# Patient Record
Sex: Male | Born: 1945
Health system: Southern US, Community
[De-identification: ages and names within clinical notes are randomized; demographics above are authoritative.]

## PROBLEM LIST (undated history)

## (undated) DIAGNOSIS — K649 Unspecified hemorrhoids: Secondary | ICD-10-CM

## (undated) DIAGNOSIS — D490 Neoplasm of unspecified behavior of digestive system: Secondary | ICD-10-CM

## (undated) DIAGNOSIS — C44300 Unspecified malignant neoplasm of skin of unspecified part of face: Secondary | ICD-10-CM

## (undated) DIAGNOSIS — M109 Gout, unspecified: Secondary | ICD-10-CM

## (undated) DIAGNOSIS — I6523 Occlusion and stenosis of bilateral carotid arteries: Secondary | ICD-10-CM

## (undated) DIAGNOSIS — E119 Type 2 diabetes mellitus without complications: Secondary | ICD-10-CM

## (undated) DIAGNOSIS — K635 Polyp of colon: Secondary | ICD-10-CM

## (undated) DIAGNOSIS — G709 Myoneural disorder, unspecified: Secondary | ICD-10-CM

## (undated) DIAGNOSIS — I10 Essential (primary) hypertension: Secondary | ICD-10-CM

## (undated) DIAGNOSIS — C801 Malignant (primary) neoplasm, unspecified: Secondary | ICD-10-CM

## (undated) DIAGNOSIS — R0600 Dyspnea, unspecified: Secondary | ICD-10-CM

## (undated) DIAGNOSIS — I4729 Other ventricular tachycardia: Secondary | ICD-10-CM

## (undated) DIAGNOSIS — G629 Polyneuropathy, unspecified: Secondary | ICD-10-CM

## (undated) DIAGNOSIS — I5020 Unspecified systolic (congestive) heart failure: Secondary | ICD-10-CM

## (undated) DIAGNOSIS — I499 Cardiac arrhythmia, unspecified: Secondary | ICD-10-CM

## (undated) DIAGNOSIS — I420 Dilated cardiomyopathy: Secondary | ICD-10-CM

## (undated) DIAGNOSIS — I2510 Atherosclerotic heart disease of native coronary artery without angina pectoris: Secondary | ICD-10-CM

## (undated) DIAGNOSIS — G4700 Insomnia, unspecified: Secondary | ICD-10-CM

## (undated) DIAGNOSIS — Z9889 Other specified postprocedural states: Secondary | ICD-10-CM

## (undated) DIAGNOSIS — Q230 Congenital stenosis of aortic valve: Secondary | ICD-10-CM

## (undated) DIAGNOSIS — E785 Hyperlipidemia, unspecified: Secondary | ICD-10-CM

## (undated) DIAGNOSIS — J45909 Unspecified asthma, uncomplicated: Secondary | ICD-10-CM

## (undated) DIAGNOSIS — R011 Cardiac murmur, unspecified: Secondary | ICD-10-CM

## (undated) DIAGNOSIS — Z87442 Personal history of urinary calculi: Secondary | ICD-10-CM

## (undated) DIAGNOSIS — J189 Pneumonia, unspecified organism: Secondary | ICD-10-CM

## (undated) DIAGNOSIS — K5790 Diverticulosis of intestine, part unspecified, without perforation or abscess without bleeding: Secondary | ICD-10-CM

## (undated) DIAGNOSIS — I219 Acute myocardial infarction, unspecified: Secondary | ICD-10-CM

## (undated) DIAGNOSIS — I639 Cerebral infarction, unspecified: Secondary | ICD-10-CM

## (undated) DIAGNOSIS — Z7901 Long term (current) use of anticoagulants: Secondary | ICD-10-CM

## (undated) DIAGNOSIS — A4902 Methicillin resistant Staphylococcus aureus infection, unspecified site: Secondary | ICD-10-CM

## (undated) DIAGNOSIS — G4733 Obstructive sleep apnea (adult) (pediatric): Secondary | ICD-10-CM

## (undated) DIAGNOSIS — I350 Nonrheumatic aortic (valve) stenosis: Secondary | ICD-10-CM

## (undated) DIAGNOSIS — I480 Paroxysmal atrial fibrillation: Secondary | ICD-10-CM

## (undated) DIAGNOSIS — E538 Deficiency of other specified B group vitamins: Secondary | ICD-10-CM

## (undated) DIAGNOSIS — R569 Unspecified convulsions: Secondary | ICD-10-CM

## (undated) HISTORY — DX: Gout, unspecified: M10.9

## (undated) HISTORY — DX: Essential (primary) hypertension: I10

## (undated) HISTORY — DX: Obstructive sleep apnea (adult) (pediatric): G47.33

## (undated) HISTORY — PX: COLONOSCOPY: SHX174

## (undated) HISTORY — DX: Unspecified hemorrhoids: K64.9

## (undated) HISTORY — DX: Congenital stenosis of aortic valve: Q23.0

## (undated) HISTORY — DX: Polyp of colon: K63.5

## (undated) HISTORY — DX: Hyperlipidemia, unspecified: E78.5

## (undated) HISTORY — DX: Insomnia, unspecified: G47.00

## (undated) HISTORY — DX: Diverticulosis of intestine, part unspecified, without perforation or abscess without bleeding: K57.90

## (undated) HISTORY — PX: OTHER SURGICAL HISTORY: SHX169

## (undated) HISTORY — DX: Cerebral infarction, unspecified: I63.9

## (undated) HISTORY — DX: Personal history of urinary calculi: Z87.442

## (undated) HISTORY — PX: CYST EXCISION: SHX5701

---

## 1998-12-09 DIAGNOSIS — E119 Type 2 diabetes mellitus without complications: Secondary | ICD-10-CM | POA: Insufficient documentation

## 2003-12-10 HISTORY — PX: CHOLECYSTECTOMY, LAPAROSCOPIC: SHX56

## 2003-12-10 HISTORY — PX: CHOLECYSTECTOMY: SHX55

## 2004-11-10 ENCOUNTER — Other Ambulatory Visit: Payer: Self-pay

## 2004-11-10 ENCOUNTER — Inpatient Hospital Stay: Payer: Self-pay | Admitting: Surgery

## 2005-12-09 LAB — HM COLONOSCOPY

## 2007-09-19 ENCOUNTER — Emergency Department: Payer: Self-pay | Admitting: Emergency Medicine

## 2007-12-10 HISTORY — PX: HEMORRHOID SURGERY: SHX153

## 2008-08-05 ENCOUNTER — Ambulatory Visit: Payer: Self-pay | Admitting: General Surgery

## 2008-08-08 ENCOUNTER — Ambulatory Visit: Payer: Self-pay | Admitting: General Surgery

## 2012-03-10 ENCOUNTER — Ambulatory Visit: Payer: Self-pay | Admitting: Family Medicine

## 2012-03-19 ENCOUNTER — Other Ambulatory Visit: Payer: Self-pay | Admitting: Vascular Surgery

## 2012-03-19 LAB — CREATININE, SERUM: EGFR (Non-African Amer.): 59 — ABNORMAL LOW

## 2012-03-20 ENCOUNTER — Ambulatory Visit: Payer: Self-pay | Admitting: Vascular Surgery

## 2012-04-02 ENCOUNTER — Ambulatory Visit: Payer: Self-pay | Admitting: Vascular Surgery

## 2012-04-02 LAB — BASIC METABOLIC PANEL
BUN: 24 mg/dL — ABNORMAL HIGH (ref 7–18)
Calcium, Total: 9.4 mg/dL (ref 8.5–10.1)
EGFR (African American): >60
EGFR (Non-African Amer.): >60
Glucose: 207 mg/dL — ABNORMAL HIGH (ref 65–99)
Potassium: 4.6 mmol/L (ref 3.5–5.1)

## 2012-04-02 LAB — CBC
HCT: 38.9 % — ABNORMAL LOW (ref 40.0–52.0)
HGB: 13.4 g/dL (ref 13.0–18.0)
MCH: 32.7 pg (ref 26.0–34.0)
MCV: 95 fL (ref 80–100)
Platelet: 161 10*3/uL (ref 150–440)
RDW: 13.1 % (ref 11.5–14.5)

## 2012-04-17 ENCOUNTER — Inpatient Hospital Stay: Payer: Self-pay | Admitting: Vascular Surgery

## 2012-04-17 HISTORY — PX: CAROTID ARTERY ANGIOPLASTY: SHX1300

## 2012-04-17 HISTORY — PX: CAROTID ENDARTERECTOMY: SUR193

## 2012-04-18 LAB — BASIC METABOLIC PANEL
Anion Gap: 6 — ABNORMAL LOW (ref 7–16)
BUN: 13 mg/dL (ref 7–18)
Calcium, Total: 7.7 mg/dL — ABNORMAL LOW (ref 8.5–10.1)
Chloride: 107 mmol/L (ref 98–107)
Creatinine: 1.07 mg/dL (ref 0.60–1.30)
Sodium: 138 mmol/L (ref 136–145)

## 2012-04-18 LAB — CBC WITH DIFFERENTIAL/PLATELET
Basophil #: 0.1 10*3/uL (ref 0.0–0.1)
Basophil %: 0.7 %
HCT: 37.1 % — ABNORMAL LOW (ref 40.0–52.0)
Lymphocyte #: 1.3 10*3/uL (ref 1.0–3.6)
Lymphocyte %: 17.7 %
MCHC: 34.2 g/dL (ref 32.0–36.0)
MCV: 96 fL (ref 80–100)
Platelet: 126 10*3/uL — ABNORMAL LOW (ref 150–440)
WBC: 7.4 10*3/uL (ref 3.8–10.6)

## 2012-04-18 LAB — PROTIME-INR
INR: 1
Prothrombin Time: 13.9 secs (ref 11.5–14.7)

## 2012-04-18 LAB — APTT: Activated PTT: 27.7 secs (ref 23.6–35.9)

## 2012-04-22 LAB — PATHOLOGY REPORT

## 2013-08-24 LAB — BASIC METABOLIC PANEL
BUN: 11 mg/dL (ref 4–21)
CREATININE: 0.9 mg/dL (ref <=1.3)
POTASSIUM: 5.2 mmol/L (ref 3.4–5.3)
Sodium: 137 mmol/L (ref 137–147)

## 2013-08-24 LAB — TSH: TSH: 3.07 u[IU]/mL (ref <=5.90)

## 2013-08-24 LAB — LIPID PANEL
Cholesterol: 188 mg/dL (ref 0–200)
HDL: 36 mg/dL (ref 35–70)
LDL Cholesterol: 78 mg/dL
Triglycerides: 369 mg/dL — AB (ref 40–160)

## 2014-10-05 LAB — HEMOGLOBIN A1C: Hgb A1c MFr Bld: 8.1 % — AB (ref 4.0–6.0)

## 2014-12-21 DIAGNOSIS — L02413 Cutaneous abscess of right upper limb: Secondary | ICD-10-CM | POA: Diagnosis not present

## 2014-12-21 DIAGNOSIS — B9562 Methicillin resistant Staphylococcus aureus infection as the cause of diseases classified elsewhere: Secondary | ICD-10-CM | POA: Diagnosis not present

## 2014-12-21 DIAGNOSIS — L02221 Furuncle of abdominal wall: Secondary | ICD-10-CM | POA: Diagnosis not present

## 2014-12-21 DIAGNOSIS — L02424 Furuncle of left upper limb: Secondary | ICD-10-CM | POA: Diagnosis not present

## 2014-12-21 DIAGNOSIS — L02423 Furuncle of right upper limb: Secondary | ICD-10-CM | POA: Diagnosis not present

## 2015-01-03 DIAGNOSIS — X32XXXA Exposure to sunlight, initial encounter: Secondary | ICD-10-CM | POA: Diagnosis not present

## 2015-01-03 DIAGNOSIS — C44619 Basal cell carcinoma of skin of left upper limb, including shoulder: Secondary | ICD-10-CM | POA: Diagnosis not present

## 2015-01-03 DIAGNOSIS — L821 Other seborrheic keratosis: Secondary | ICD-10-CM | POA: Diagnosis not present

## 2015-01-03 DIAGNOSIS — L400 Psoriasis vulgaris: Secondary | ICD-10-CM | POA: Diagnosis not present

## 2015-01-03 DIAGNOSIS — L570 Actinic keratosis: Secondary | ICD-10-CM | POA: Diagnosis not present

## 2015-01-03 DIAGNOSIS — D485 Neoplasm of uncertain behavior of skin: Secondary | ICD-10-CM | POA: Diagnosis not present

## 2015-01-03 DIAGNOSIS — Z85828 Personal history of other malignant neoplasm of skin: Secondary | ICD-10-CM | POA: Diagnosis not present

## 2015-01-03 DIAGNOSIS — C44519 Basal cell carcinoma of skin of other part of trunk: Secondary | ICD-10-CM | POA: Diagnosis not present

## 2015-01-09 DIAGNOSIS — C44619 Basal cell carcinoma of skin of left upper limb, including shoulder: Secondary | ICD-10-CM | POA: Diagnosis not present

## 2015-01-09 DIAGNOSIS — C44519 Basal cell carcinoma of skin of other part of trunk: Secondary | ICD-10-CM | POA: Diagnosis not present

## 2015-02-06 DIAGNOSIS — I10 Essential (primary) hypertension: Secondary | ICD-10-CM | POA: Diagnosis not present

## 2015-02-06 DIAGNOSIS — E781 Pure hyperglyceridemia: Secondary | ICD-10-CM | POA: Diagnosis not present

## 2015-02-06 DIAGNOSIS — E1165 Type 2 diabetes mellitus with hyperglycemia: Secondary | ICD-10-CM | POA: Diagnosis not present

## 2015-02-06 DIAGNOSIS — Z0000 Encounter for general adult medical examination without abnormal findings: Secondary | ICD-10-CM | POA: Diagnosis not present

## 2015-02-06 LAB — BASIC METABOLIC PANEL: GLUCOSE: 224 mg/dL

## 2015-04-02 NOTE — Op Note (Signed)
PATIENT NAME:  Sean Hayden, Sean Hayden MR#:  [id number] DATE OF BIRTH:  1946-02-14  DATE OF PROCEDURE:  04/17/2012  PREOPERATIVE DIAGNOSIS: Critical stenosis of the right internal carotid artery.   POSTOPERATIVE DIAGNOSIS: Critical stenosis of the right internal carotid artery.  PROCEDURE PERFORMED:  1. Right carotid endarterectomy with Cormatrix patch angioplasty.  2. Repair of arterial defect with xenograft, Cormatrix patch.   SURGEON: Katha Cabal, M.D.   ANESTHESIA: General by endotracheal intubation.   FLUIDS: Per anesthesia record.   ESTIMATED BLOOD LOSS: 200 mL.   SPECIMEN: Carotid plaque to pathology for permanent section.   INDICATIONS: Mr. Sheppard is a 69 year old gentleman who presented to the office with the findings of carotid stenosis on ultrasound. Subsequent physical examination as well as a follow-up CT angiography demonstrated greater than 90% stenosis of the right internal carotid artery. The risks and benefits for carotid endarterectomy were reviewed with the patient and the wife. Alternative therapies were also reviewed. All questions were answered. The patient has agreed to proceed with surgery.   DESCRIPTION OF PROCEDURE: The patient is taken to the operating room and placed in the supine position. After adequate general anesthesia is induced and appropriate invasive monitors are placed, he is positioned with his neck extended and rotated to the left. Neck and chest wall are prepped and draped in sterile fashion.   A curvilinear incision is created along the anterior margin of the sternocleidomastoid muscle. The dissection is carried down through the soft tissues to expose the platysma. The platysma is transected. The external jugular vein is ligated with 2-0 silk ties. Omohyoid is then revealed and the common carotid artery identified at the level of the omohyoid. Dissection is then carried along the anterior margin of the carotid in a craniad direction. Bifurcation is  exposed. The superior thyroid and external carotid artery are looped with Silastic vessel loops. 7000 units of heparin is given and allowed to circulate for five minutes.   After adequate dissection of the internal carotid artery and the common carotid artery, the common carotid artery is clamped followed by the external, followed by the internal carotid artery. Arteriotomy is made with a #11 blade scalpel and extended, and an indwelling Sundt shunt is placed without difficulty and flow is re-established to the brain.   Endarterectomy is performed. Under direct visualization for the common bulb and internal carotid artery, the external carotid artery is treated with eversion technique. 7-0 interrupted Prolene sutures are used to tack the distal intimal edge within the internal carotid artery. 6-0 Prolene sutures are used to tack the proximal intimal edge in the common carotid artery. A total of five 7-0 and four 6-0 Prolenes are utilized.   Cormatrix patch is then rehydrated on the back table. It is trimmed to an appropriate bevel, and then applied to the arterial defect using interrupted 6-0 Prolene in a running fashion with a four-quadrant technique. Prior to completing the last suture line, the Sundt shunt is removed. Copious irrigation is performed throughout the procedure including 40 mL at the time of shunt removal. Backbleeding is allowed for several seconds and the suture line is completed. Flow is then re-established first to the external carotid artery and then the internal carotid artery to prevent distal embolization. The suture line is inspected for hemostasis. Surgicel is placed after the wound is irrigated and then the wound is closed using 3-0 running Vicryl for the platysma and 4-0 Monocryl subcuticular followed by Dermabond for the skin.    The patient  is awakened in the operating room, moving all extremities. Sponge and needle counts are correct times two. He is taken to the recovery area  in stable condition.    ____________________________ Katha Cabal, MD ggs:bjt D: 04/17/2012 13:47:11 ET T: 04/17/2012 14:20:02 ET JOB#: [id number]  cc: Katha Cabal, MD, <Dictator> Kirstie Peri. Caryn Section, MD Katha Cabal MD ELECTRONICALLY SIGNED 04/27/2012 17:41

## 2015-05-15 DIAGNOSIS — X32XXXA Exposure to sunlight, initial encounter: Secondary | ICD-10-CM | POA: Diagnosis not present

## 2015-05-15 DIAGNOSIS — L408 Other psoriasis: Secondary | ICD-10-CM | POA: Diagnosis not present

## 2015-05-15 DIAGNOSIS — L570 Actinic keratosis: Secondary | ICD-10-CM | POA: Diagnosis not present

## 2015-05-15 DIAGNOSIS — Z85828 Personal history of other malignant neoplasm of skin: Secondary | ICD-10-CM | POA: Diagnosis not present

## 2015-05-25 ENCOUNTER — Encounter: Payer: Self-pay | Admitting: Family Medicine

## 2015-05-25 ENCOUNTER — Encounter: Payer: Self-pay | Admitting: *Deleted

## 2015-05-25 ENCOUNTER — Ambulatory Visit (INDEPENDENT_AMBULATORY_CARE_PROVIDER_SITE_OTHER): Payer: Commercial Managed Care - HMO | Admitting: Family Medicine

## 2015-05-25 VITALS — BP 114/58 | HR 78 | Temp 98.6°F | Resp 18 | Ht 67.0 in | Wt 262.0 lb

## 2015-05-25 DIAGNOSIS — G459 Transient cerebral ischemic attack, unspecified: Secondary | ICD-10-CM | POA: Insufficient documentation

## 2015-05-25 DIAGNOSIS — R609 Edema, unspecified: Secondary | ICD-10-CM | POA: Insufficient documentation

## 2015-05-25 DIAGNOSIS — L409 Psoriasis, unspecified: Secondary | ICD-10-CM | POA: Insufficient documentation

## 2015-05-25 DIAGNOSIS — I10 Essential (primary) hypertension: Secondary | ICD-10-CM

## 2015-05-25 DIAGNOSIS — M1990 Unspecified osteoarthritis, unspecified site: Secondary | ICD-10-CM | POA: Insufficient documentation

## 2015-05-25 DIAGNOSIS — E1165 Type 2 diabetes mellitus with hyperglycemia: Secondary | ICD-10-CM | POA: Diagnosis not present

## 2015-05-25 DIAGNOSIS — IMO0002 Reserved for concepts with insufficient information to code with codable children: Secondary | ICD-10-CM

## 2015-05-25 DIAGNOSIS — K649 Unspecified hemorrhoids: Secondary | ICD-10-CM | POA: Insufficient documentation

## 2015-05-25 DIAGNOSIS — G4730 Sleep apnea, unspecified: Secondary | ICD-10-CM | POA: Insufficient documentation

## 2015-05-25 DIAGNOSIS — G4733 Obstructive sleep apnea (adult) (pediatric): Secondary | ICD-10-CM | POA: Insufficient documentation

## 2015-05-25 DIAGNOSIS — Z87442 Personal history of urinary calculi: Secondary | ICD-10-CM

## 2015-05-25 DIAGNOSIS — I779 Disorder of arteries and arterioles, unspecified: Secondary | ICD-10-CM | POA: Diagnosis not present

## 2015-05-25 DIAGNOSIS — I739 Peripheral vascular disease, unspecified: Secondary | ICD-10-CM

## 2015-05-25 DIAGNOSIS — J45909 Unspecified asthma, uncomplicated: Secondary | ICD-10-CM | POA: Insufficient documentation

## 2015-05-25 DIAGNOSIS — K5730 Diverticulosis of large intestine without perforation or abscess without bleeding: Secondary | ICD-10-CM | POA: Insufficient documentation

## 2015-05-25 HISTORY — DX: Personal history of urinary calculi: Z87.442

## 2015-05-25 MED ORDER — INSULIN DETEMIR 100 UNIT/ML ~~LOC~~ SOLN: 60.0000 [IU] | Freq: Two times a day (BID) | SUBCUTANEOUS | Status: DC

## 2015-05-25 MED ORDER — ASPIRIN 81 MG PO TABS: 81.0000 mg | ORAL_TABLET | Freq: Two times a day (BID) | ORAL | Status: DC

## 2015-05-25 NOTE — Progress Notes (Signed)
Patient: Sean Hayden Male    DOB: 1946-04-07   69 y.o.   MRN: [id number] Visit Date: 05/25/2015  Today's Provider: Lelon Huh, MD   Chief Complaint  Patient presents with  . Follow-up  . Diabetes   Subjective:    HPI   Diabetes Mellitus Type II, Follow-up:   No results found for: HGBA1C  Last A1C-10/05/2014=8.1  Last seen for diabetes 4 months ago.  Management changes included Increase 2nd dose of Levemir to 50 units for total daily dose of 100 units. He has labs done at [place] last week with Hgba1c=9.0. He had negative urine microalbumin, normal electrolyte and eGFR=96.2, and normal TSH.  He reports good compliance with treatment. He is not having side effects. none2 Home blood sugar records: fasting range: 212 usually upper 200s after eating.  He states he has been consuming a lot of breads.  Episodes of hypoglycemia? no   Current Insulin Regimen: Levemir 50 units BID Most Recent Eye Exam: last year Weight trend: stable Current exercise: walking  Pertinent Labs:    Component Value Date/Time   CREATININE 1.07 04/18/2012 0412    Wt Readings from Last 3 Encounters:  05/25/15 262 lb (118.842 kg)  02/06/15 268 lb (121.564 kg)    ------------------------------------------------------------------------  Speech difficulty.  He states that he had an episode last week after getting up than he couldn't get his words out. Episode lasted a few minutes and spontaneously resolved. He does have a history of carotid artery disease followed by Dr. Lucky Cowboy, but is not sure when he was last seen. Our last records from Dr. Lucky Cowboy are from 2014.     Previous Medications   ACYCLOVIR (ZOVIRAX) 200 MG CAPSULE    Take by mouth.   ASPIRIN 81 MG TABLET    Take by mouth.   ATORVASTATIN (LIPITOR) 40 MG TABLET    Take by mouth.   CANAGLIFLOZIN (INVOKANA) 300 MG TABS TABLET    Take by mouth.   FLAXSEED, LINSEED, (FLAXSEED OIL) 1000 MG CAPS       FLUTICASONE (FLONASE) 50 MCG/ACT NASAL SPRAY     Place into the nose.   INDOMETHACIN (INDOCIN SR) 75 MG CR CAPSULE    Take by mouth.   INSULIN DETEMIR (LEVEMIR) 100 UNIT/ML INJECTION    Inject into the skin.   LISINOPRIL (PRINIVIL,ZESTRIL) 5 MG TABLET    Take by mouth.   LORATADINE (CLARITIN) 10 MG TABLET    Take by mouth.   METFORMIN (GLUCOPHAGE) 1000 MG TABLET    Take by mouth.   NEOMYCIN-POLYMYXIN-HYDROCORTISONE (CORTISPORIN) OTIC SOLUTION    Place in ear(s).   OMEGA-3 FATTY ACIDS (FISH OIL) 1200 MG CAPS    Take by mouth.   SPECIALTY VITAMINS PRODUCTS PO    Take by mouth.   TRIAMCINOLONE CREAM (KENALOG) 0.5 %    TRIAMCINOLONE ACETONIDE, 0.5% (External Cream)  1 application to affected area wo times daily, as needed for 0 days  Quantity: 30;  Refills: 1   Ordered :14-Jan-2014  Lelon Huh MD;  Started 14-Jan-2014 Active Comments: Medication taken as needed.    ZOLPIDEM (AMBIEN) 10 MG TABLET    Take by mouth.    Review of Systems  Constitutional: Positive for fever. Negative for chills.  HENT: Negative for congestion.   Respiratory: Negative for chest tightness and shortness of breath.   Cardiovascular: Negative for chest pain, palpitations and leg swelling.  Neurological: Negative for dizziness.    History  Substance Use Topics  . Smoking status:  Former Smoker  . Smokeless tobacco: Not on file  . Alcohol Use: 0.0 oz/week    0 Standard drinks or equivalent per week   Objective:   BP 114/58 mmHg  Pulse 78  Temp(Src) 98.6 F (37 C) (Oral)  Resp 18  Ht _0  (1.702 m)  Wt 262 lb (118.842 kg)  BMI 41.03 kg/m2  SpO2 95%  Physical Exam  General Appearance:    Alert, cooperative, no distress, obese  Eyes:    PERRL, conjunctiva/corneas clear, EOM's intact       Lungs:     Clear to auscultation bilaterally, respirations unlabored  Heart:    Regular rate and rhythm  Neurologic:   Awake, alert, oriented x 3. No apparent focal neurological           defect.            Assessment & Plan:     1. Diabetes mellitus  type 2, uncontrolled Increase Levemir to 60 units twice daily. Recheck in 3 months.   2. Essential hypertension Stable. Continue current medications.    3. Transient cerebral ischemia, unspecified transient cerebral ischemia type (suspected based on high risk) Get back in with vascular ASAP. Will increase ASA to 2 x 50m daily for now.   4. Carotid artery disease, unspecified laterality      Follow up: No Follow-up on file.

## 2015-05-29 ENCOUNTER — Encounter: Payer: Self-pay | Admitting: Family Medicine

## 2015-05-29 DIAGNOSIS — G459 Transient cerebral ischemic attack, unspecified: Secondary | ICD-10-CM | POA: Diagnosis not present

## 2015-05-29 DIAGNOSIS — E119 Type 2 diabetes mellitus without complications: Secondary | ICD-10-CM | POA: Diagnosis not present

## 2015-05-29 DIAGNOSIS — E669 Obesity, unspecified: Secondary | ICD-10-CM | POA: Diagnosis not present

## 2015-05-29 DIAGNOSIS — I6529 Occlusion and stenosis of unspecified carotid artery: Secondary | ICD-10-CM | POA: Diagnosis not present

## 2015-05-30 ENCOUNTER — Other Ambulatory Visit: Payer: Self-pay | Admitting: Vascular Surgery

## 2015-05-30 DIAGNOSIS — G459 Transient cerebral ischemic attack, unspecified: Secondary | ICD-10-CM

## 2015-05-30 DIAGNOSIS — I6523 Occlusion and stenosis of bilateral carotid arteries: Secondary | ICD-10-CM

## 2015-06-06 ENCOUNTER — Ambulatory Visit
Admission: RE | Admit: 2015-06-06 | Discharge: 2015-06-06 | Disposition: A | Discharge status: Home / Self Care | Payer: Commercial Managed Care - HMO | Source: Ambulatory Visit | Attending: Vascular Surgery | Admitting: Vascular Surgery

## 2015-06-06 DIAGNOSIS — I6529 Occlusion and stenosis of unspecified carotid artery: Secondary | ICD-10-CM | POA: Insufficient documentation

## 2015-06-06 DIAGNOSIS — G459 Transient cerebral ischemic attack, unspecified: Secondary | ICD-10-CM | POA: Diagnosis not present

## 2015-06-06 DIAGNOSIS — I6522 Occlusion and stenosis of left carotid artery: Secondary | ICD-10-CM | POA: Diagnosis not present

## 2015-06-06 DIAGNOSIS — I6523 Occlusion and stenosis of bilateral carotid arteries: Secondary | ICD-10-CM

## 2015-06-06 DIAGNOSIS — I771 Stricture of artery: Secondary | ICD-10-CM | POA: Diagnosis not present

## 2015-06-06 DIAGNOSIS — I6501 Occlusion and stenosis of right vertebral artery: Secondary | ICD-10-CM | POA: Diagnosis not present

## 2015-06-06 MED ORDER — IOHEXOL 350 MG/ML SOLN
100.0000 mL | Freq: Once | INTRAVENOUS | Status: AC | PRN
  Administered 2015-06-06: 80 mL via INTRAVENOUS

## 2015-06-08 DIAGNOSIS — G459 Transient cerebral ischemic attack, unspecified: Secondary | ICD-10-CM | POA: Diagnosis not present

## 2015-06-08 DIAGNOSIS — E785 Hyperlipidemia, unspecified: Secondary | ICD-10-CM | POA: Diagnosis not present

## 2015-06-08 DIAGNOSIS — E669 Obesity, unspecified: Secondary | ICD-10-CM | POA: Diagnosis not present

## 2015-06-08 DIAGNOSIS — I6529 Occlusion and stenosis of unspecified carotid artery: Secondary | ICD-10-CM | POA: Diagnosis not present

## 2015-06-08 DIAGNOSIS — I10 Essential (primary) hypertension: Secondary | ICD-10-CM | POA: Diagnosis not present

## 2015-06-08 DIAGNOSIS — E119 Type 2 diabetes mellitus without complications: Secondary | ICD-10-CM | POA: Diagnosis not present

## 2015-07-11 ENCOUNTER — Other Ambulatory Visit: Payer: Self-pay | Admitting: Family Medicine

## 2015-08-23 ENCOUNTER — Encounter: Payer: Self-pay | Admitting: *Deleted

## 2015-08-25 ENCOUNTER — Encounter: Payer: Self-pay | Admitting: Family Medicine

## 2015-08-25 ENCOUNTER — Ambulatory Visit (INDEPENDENT_AMBULATORY_CARE_PROVIDER_SITE_OTHER): Payer: Commercial Managed Care - HMO | Admitting: Family Medicine

## 2015-08-25 VITALS — BP 100/54 | HR 79 | Temp 98.7°F | Resp 18 | Ht 67.0 in | Wt 265.0 lb

## 2015-08-25 DIAGNOSIS — IMO0002 Reserved for concepts with insufficient information to code with codable children: Secondary | ICD-10-CM

## 2015-08-25 DIAGNOSIS — Z23 Encounter for immunization: Secondary | ICD-10-CM

## 2015-08-25 DIAGNOSIS — E1165 Type 2 diabetes mellitus with hyperglycemia: Secondary | ICD-10-CM | POA: Diagnosis not present

## 2015-08-25 LAB — POCT GLYCOSYLATED HEMOGLOBIN (HGB A1C)
Est. average glucose Bld gHb Est-mCnc: 229
Hemoglobin A1C: 9.6

## 2015-08-25 MED ORDER — INSULIN DETEMIR 100 UNIT/ML ~~LOC~~ SOLN: 65.0000 [IU] | Freq: Two times a day (BID) | SUBCUTANEOUS | Status: DC

## 2015-08-25 NOTE — Patient Instructions (Addendum)
Increase Levemir to 65 units twice a day   Exercise 140 minutes every week, average of 30 minutes a day   Basic Carbohydrate Counting for Diabetes Mellitus Carbohydrate counting is a method for keeping track of the amount of carbohydrates you eat. Eating carbohydrates naturally increases the level of sugar (glucose) in your blood, so it is important for you to know the amount that is okay for you to have in every meal. Carbohydrate counting helps keep the level of glucose in your blood within normal limits. The amount of carbohydrates allowed is different for every person. A dietitian can help you calculate the amount that is right for you. Once you know the amount of carbohydrates you can have, you can count the carbohydrates in the foods you want to eat. Carbohydrates are found in the following foods:  Grains, such as breads and cereals.  Dried beans and soy products.  Starchy vegetables, such as potatoes, peas, and corn.  Fruit and fruit juices.  Milk and yogurt.  Sweets and snack foods, such as cake, cookies, candy, chips, soft drinks, and fruit drinks. CARBOHYDRATE COUNTING There are two ways to count the carbohydrates in your food. You can use either of the methods or a combination of both. Reading the "Nutrition Facts" on [place] The "Nutrition Facts" is an area that is included on the labels of almost all packaged food and beverages in the [place]. It includes the serving size of that food or beverage and information about the nutrients in each serving of the food, including the grams (g) of carbohydrate per serving.  Decide the number of servings of this food or beverage that you will be able to eat or drink. Multiply that number of servings by the number of grams of carbohydrate that is listed on the label for that serving. The total will be the amount of carbohydrates you will be having when you eat or drink this food or beverage. Learning Standard Serving Sizes of  Food When you eat food that is not packaged or does not include "Nutrition Facts" on the label, you need to measure the servings in order to count the amount of carbohydrates.A serving of most carbohydrate-rich foods contains about 15 g of carbohydrates. The following list includes serving sizes of carbohydrate-rich foods that provide 15 g ofcarbohydrate per serving:   1 slice of bread (1 oz) or 1 six-inch tortilla.    of a hamburger bun or English muffin.  4-6 crackers.   cup unsweetened dry cereal.    cup hot cereal.   cup rice or pasta.    cup mashed potatoes or  of a large baked potato.  1 cup fresh fruit or one small piece of fruit.    cup canned or frozen fruit or fruit juice.  1 cup milk.   cup plain fat-free yogurt or yogurt sweetened with artificial sweeteners.   cup cooked dried beans or starchy vegetable, such as peas, corn, or potatoes.  Decide the number of standard-size servings that you will eat. Multiply that number of servings by 15 (the grams of carbohydrates in that serving). For example, if you eat 2 cups of strawberries, you will have eaten 2 servings and 30 g of carbohydrates (2 servings x 15 g = 30 g). For foods such as soups and casseroles, in which more than one food is mixed in, you will need to count the carbohydrates in each food that is included. EXAMPLE OF CARBOHYDRATE COUNTING Sample Dinner  3  oz chicken breast.   cup of brown rice.   cup of corn.  1 cup milk.   1 cup strawberries with sugar-free whipped topping.  Carbohydrate Calculation Step 1: Identify the foods that contain carbohydrates:   Rice.   Corn.   Milk.   Strawberries. Step 2:Calculate the number of servings eaten of each:   2 servings of rice.   1 serving of corn.   1 serving of milk.   1 serving of strawberries. Step 3: Multiply each of those number of servings by 15 g:   2 servings of rice x 15 g = 30 g.   1 serving of corn x 15 g  = 15 g.   1 serving of milk x 15 g = 15 g.   1 serving of strawberries x 15 g = 15 g. Step 4: Add together all of the amounts to find the total grams of carbohydrates eaten: 30 g + 15 g + 15 g + 15 g = 75 g. Document Released: 11/25/2005 Document Revised: 04/11/2014 Document Reviewed: 10/22/2013 [facility] Patient Information 2015 [place], [place]. This information is not intended to replace advice given to you by your health care Sean Hayden. Make sure you discuss any questions you have with your health care Sean Hayden.

## 2015-08-25 NOTE — Progress Notes (Signed)
Patient: Sean Hayden Male    DOB: [birth year]   69 y.o.   MRN: [id number] Visit Date: 08/25/2015  Today's Provider: Lelon Huh, MD   Chief Complaint  Patient presents with  . Follow-up  . Diabetes  . Hypertension  . Transient Ischemic Attack   Subjective:    HPI  Low-up for TIA from 05/25/2015; advised to return to vascular and increase aspirin 81 mg x2 qd for now.    Diabetes Mellitus Type II, Follow-up:   Lab Results  Component Value Date   HGBA1C 8.1* 10/05/2014   Last seen for diabetes 3 months ago.  Management since then includes; increase Levemir to 60 units x2 qd. He reports good compliance with treatment. He is not having side effects. none Current symptoms include none and have been . Home blood sugar records: fasting range: 160-200 Post Prandials 250-00 Episodes of hypoglycemia? no   Current Insulin Regimen: 60 units twice a day Most Recent Eye Exam: due now Weight trend: stable Prior visit with dietician: no Current diet: well balanced Current exercise: none  He admits to poor eating habits including large meals with lots of red meats and lots of breads and biscuits.   ------------------------------------------------------------------------   Hypertension, follow-up:  BP Readings from Last 3 Encounters:  08/25/15 100/54  05/25/15 114/58  02/06/15 124/60    He was last seen for hypertension 3 months ago.  BP at that visit was 114/58. Management since that visit includes none .He reports good compliance with treatment. He is not having side effects. none  He is not exercising. He is adherent to low salt diet.   Outside blood pressures are normal. He is experiencing none.  Patient denies none.   Cardiovascular risk factors include diabetes mellitus.  Use of agents associated with hypertension: none.   Wt Readings from Last 3 Encounters:  08/25/15 265 lb (120.203 kg)  05/25/15 262 lb (118.842 kg)  02/06/15 268 lb (121.564 kg)     ----------------------------------------------------------------------  Follow up suspected TIA He has had follow up with Dr. Delana Meyer since last visit and had CTA of carotids which were unremarkable. He has had no symptoms since then.    Allergies  Allergen Reactions  . Actos  [Pioglitazone]    Previous Medications   ACYCLOVIR (ZOVIRAX) 200 MG CAPSULE    Take by mouth.   ASPIRIN 81 MG TABLET    Take 1 tablet (81 mg total) by mouth 2 (two) times daily.   ATORVASTATIN (LIPITOR) 40 MG TABLET    Take by mouth.   FLAXSEED, LINSEED, (FLAXSEED OIL) 1000 MG CAPS       FLUTICASONE (FLONASE) 50 MCG/ACT NASAL SPRAY    Place into the nose.   INDOMETHACIN (INDOCIN SR) 75 MG CR CAPSULE    Take by mouth.   INSULIN DETEMIR (LEVEMIR) 100 UNIT/ML INJECTION    Inject 0.6 mLs (60 Units total) into the skin 2 (two) times daily.   INVOKANA 300 MG TABS TABLET    TAKE ONE TABLET BY MOUTH EVERY DAY IN THE MORNING.   LISINOPRIL (PRINIVIL,ZESTRIL) 5 MG TABLET    Take by mouth.   LORATADINE (CLARITIN) 10 MG TABLET    Take by mouth.   METFORMIN (GLUCOPHAGE) 1000 MG TABLET    Take by mouth.   NEOMYCIN-POLYMYXIN-HYDROCORTISONE (CORTISPORIN) OTIC SOLUTION    Place in ear(s).   OMEGA-3 FATTY ACIDS (FISH OIL) 1200 MG CAPS    Take by mouth.   SPECIALTY VITAMINS PRODUCTS PO  Take by mouth.   TRIAMCINOLONE CREAM (KENALOG) 0.5 %    TRIAMCINOLONE ACETONIDE, 0.5% (External Cream)  1 application to affected area wo times daily, as needed for 0 days  Quantity: 30;  Refills: 1   Ordered :14-Jan-2014  Lelon Huh MD;  Started 14-Jan-2014 Active Comments: Medication taken as needed.    ZOLPIDEM (AMBIEN) 10 MG TABLET    Take by mouth.    Review of Systems  Constitutional: Negative for fever, chills and appetite change.  Respiratory: Negative for chest tightness, shortness of breath and wheezing.   Cardiovascular: Negative.  Negative for chest pain and palpitations.  Gastrointestinal: Negative for nausea, vomiting  and abdominal pain.  Neurological: Negative for dizziness, light-headedness and headaches.    Social History  Substance Use Topics  . Smoking status: Former Smoker    Types: Cigars    Quit date: 04/08/2012  . Smokeless tobacco: Not on file     Comment: smokes cigars occasionally  . Alcohol Use: 0.0 oz/week    0 Standard drinks or equivalent per week     Comment: occassional   Objective:   BP 100/54 mmHg  Pulse 79  Temp(Src) 98.7 F (37.1 C) (Oral)  Resp 18  Ht 5\' 7"  (1.702 m)  Wt 265 lb (120.203 kg)  BMI 41.50 kg/m2  SpO2 95%  Physical Exam  General Appearance:    Alert, cooperative, no distress, obese  Eyes:    PERRL, conjunctiva/corneas clear, EOM's intact       Lungs:     Clear to auscultation bilaterally, respirations unlabored  Heart:    Regular rate and rhythm  Neurologic:   Awake, alert, oriented x 3. No apparent focal neurological           defect.         Results for orders placed or performed in visit on 08/25/15  POCT glycosylated hemoglobin (Hb A1C)  Result Value Ref Range   Hemoglobin A1C 9.6    Est. average glucose Bld gHb Est-mCnc 229        Assessment & Plan:     1. Diabetes mellitus type 2, uncontrolled Poor compliance with diet and exercise. Recommended he make significant reductions in portion sizes and cut back on carbs,. and start exercising up to 140 minutes a week. Recommend he start mealtime insulin but he strongly opposed to this and wants to try working on lifestyle changes first. .   - Increase Levemir to 65 units twice a day.  - POCT glycosylated hemoglobin (Hb A1C)  2. HTN well controlled Continue current medications.    3. Need for influenza vaccination  - Flu vaccine HIGH DOSE PF   He has follow up at [facility] in November and will get A1c checked there, then follow up her in December.       Lelon Huh, MD  [facility]

## 2015-09-05 ENCOUNTER — Other Ambulatory Visit: Payer: Self-pay | Admitting: Family Medicine

## 2015-09-05 NOTE — Telephone Encounter (Signed)
Please call in zolpidem  

## 2015-09-06 NOTE — Telephone Encounter (Signed)
Rx called in to pharmacy. 

## 2015-09-17 DIAGNOSIS — L02411 Cutaneous abscess of right axilla: Secondary | ICD-10-CM | POA: Diagnosis not present

## 2015-10-09 ENCOUNTER — Other Ambulatory Visit: Payer: Self-pay | Admitting: Family Medicine

## 2015-10-09 NOTE — Telephone Encounter (Signed)
Please call in zolpidem  

## 2015-10-09 NOTE — Telephone Encounter (Signed)
Rx called in to pharmacy. 

## 2015-10-10 ENCOUNTER — Other Ambulatory Visit: Payer: Self-pay | Admitting: Family Medicine

## 2015-10-10 NOTE — Telephone Encounter (Signed)
Pt states he would like to see if Dr. Caryn Section is welling to give him some simples of INVOKANA 300 MG TABS tablet.  CC

## 2015-10-10 NOTE — Telephone Encounter (Signed)
He can have 4 sample bottles of invokana 300

## 2015-10-10 NOTE — Telephone Encounter (Signed)
Patient notified

## 2015-11-13 DIAGNOSIS — C44329 Squamous cell carcinoma of skin of other parts of face: Secondary | ICD-10-CM | POA: Diagnosis not present

## 2015-11-13 DIAGNOSIS — D485 Neoplasm of uncertain behavior of skin: Secondary | ICD-10-CM | POA: Diagnosis not present

## 2015-11-13 DIAGNOSIS — Z85828 Personal history of other malignant neoplasm of skin: Secondary | ICD-10-CM | POA: Diagnosis not present

## 2015-11-13 DIAGNOSIS — C44319 Basal cell carcinoma of skin of other parts of face: Secondary | ICD-10-CM | POA: Diagnosis not present

## 2015-11-13 DIAGNOSIS — L02423 Furuncle of right upper limb: Secondary | ICD-10-CM | POA: Diagnosis not present

## 2015-11-13 DIAGNOSIS — L408 Other psoriasis: Secondary | ICD-10-CM | POA: Diagnosis not present

## 2015-11-13 DIAGNOSIS — L570 Actinic keratosis: Secondary | ICD-10-CM | POA: Diagnosis not present

## 2015-11-23 LAB — HEPATIC FUNCTION PANEL
ALT: 29 U/L (ref 10–40)
AST: 20 U/L (ref 14–40)
Alkaline Phosphatase: 78 U/L (ref 25–125)
BILIRUBIN, TOTAL: 0.6 mg/dL

## 2015-11-23 LAB — LIPID PANEL
CALCIUM: 9.2
Cholesterol: 112 mg/dL (ref 0–200)
FOLATE: 21.2
HDL: 39 mg/dL (ref 35–70)
LDL Cholesterol: 52 mg/dL
MCV: 91.6
POTASSIUM: 4.6
PROTEIN: 7.6
SODIUM: 140
Triglycerides: 202 mg/dL — AB (ref 40–160)
VITAMIN B12: 230
Vit D, 25-Hydroxy: 23.91

## 2015-11-23 LAB — CBC AND DIFFERENTIAL
HEMATOCRIT: 45 % (ref 41–53)
HEMOGLOBIN: 15.2 g/dL (ref 13.5–17.5)
PLATELETS: 166 10*3/uL (ref 150–399)
WBC: 6.7 10^3/mL

## 2015-11-23 LAB — BASIC METABOLIC PANEL
BUN: 14 mg/dL (ref 4–21)
Creatinine: 0.8 mg/dL (ref 0.6–1.3)
Glucose: 138 mg/dL

## 2015-11-23 LAB — TSH: TSH: 1.82 u[IU]/mL (ref 0.41–5.90)

## 2015-11-23 LAB — HEMOGLOBIN A1C: HEMOGLOBIN A1C: 7.3

## 2015-11-23 LAB — PSA: PSA: 0.077

## 2015-12-14 ENCOUNTER — Encounter: Payer: Self-pay | Admitting: Family Medicine

## 2015-12-14 ENCOUNTER — Ambulatory Visit (INDEPENDENT_AMBULATORY_CARE_PROVIDER_SITE_OTHER): Payer: Commercial Managed Care - HMO | Admitting: Family Medicine

## 2015-12-14 ENCOUNTER — Encounter: Payer: Self-pay | Admitting: *Deleted

## 2015-12-14 VITALS — BP 98/60 | HR 77 | Temp 98.7°F | Resp 18 | Ht 67.0 in | Wt 259.0 lb

## 2015-12-14 DIAGNOSIS — E119 Type 2 diabetes mellitus without complications: Secondary | ICD-10-CM | POA: Diagnosis not present

## 2015-12-14 DIAGNOSIS — E538 Deficiency of other specified B group vitamins: Secondary | ICD-10-CM | POA: Diagnosis not present

## 2015-12-14 DIAGNOSIS — J40 Bronchitis, not specified as acute or chronic: Secondary | ICD-10-CM | POA: Diagnosis not present

## 2015-12-14 DIAGNOSIS — Z794 Long term (current) use of insulin: Secondary | ICD-10-CM | POA: Diagnosis not present

## 2015-12-14 MED ORDER — LEVOFLOXACIN 500 MG PO TABS: 500.0000 mg | ORAL_TABLET | Freq: Every day | ORAL | Status: AC

## 2015-12-14 NOTE — Progress Notes (Signed)
Patient: Sean Hayden Male    DOB: Aug 29, 1946   70 y.o.   MRN: [id number] Visit Date: 12/14/2015  Today's Provider: Lelon Huh, MD   Chief Complaint  Patient presents with  . Follow-up  . Diabetes   Subjective:    HPI   Diabetes Mellitus Type II, Follow-up:   Lab Results  Component Value Date   HGBA1C 9.6 08/25/2015   HGBA1C 8.1* 10/05/2014   Last seen for diabetes 4 months ago.   Management since then includes;  Increase Levemir to 65 units twice a day. Exercise 140 minutes every week, average of 30 minutes a day. He reports good compliance with treatment. He is not having side effects. none Current symptoms include none and have been stable. Home blood sugar records: fasting range: 130  Episodes of hypoglycemia? no   Current Insulin Regimen: 65 units qd Most Recent Eye Exam: 10/24/2015 Weight trend: slowly decreasing Prior visit with dietician: no Current diet: well balanced Current exercise: yard work   A1c at [place] on 11-23-15 was 7.3  ----------------------------------------------------------------------   Hypertension, follow-up:  BP Readings from Last 3 Encounters:  12/14/15 98/60  08/25/15 100/54  05/25/15 114/58    He was last seen for hypertension 25months ago.  BP at that visit was 114/58. Management since that visit includes; no changes .He reports good compliance with treatment. He is not having side effects. none  He is exercising. He is adherent to low salt diet.   Outside blood pressures are n/a. He is experiencing none.  Patient denies none.   Cardiovascular risk factors include diabetes mellitus.  Use of agents associated with hypertension: none.   ----------------------------------------------------------------------  Cough Has had persistent cough and chest congestion for the last 10 days without improvement. Some sinus drainage and congestion. No fevers, chills, or sweats  B12 deficiency  Labs at [facility] found mildly  reduced B12 level of 230 and was given infectable B12. He has not yet started shots, but has been taking oral vitamin b12, 1000 units a day. .       Allergies  Allergen Reactions  . Actos  [Pioglitazone]    Previous Medications   ACYCLOVIR (ZOVIRAX) 200 MG CAPSULE    Take by mouth.   ASPIRIN 81 MG TABLET    Take 1 tablet (81 mg total) by mouth 2 (two) times daily.   ATORVASTATIN (LIPITOR) 40 MG TABLET    Take by mouth.   CYANOCOBALAMIN 1000 MCG TABLET    Take 100 mcg by mouth 2 (two) times daily.   ERGOCALCIFEROL (VITAMIN D2) 50000 UNITS CAPSULE    Take 50,000 Units by mouth once a week.   FLAXSEED, LINSEED, (FLAXSEED OIL) 1000 MG CAPS       FLUTICASONE (FLONASE) 50 MCG/ACT NASAL SPRAY    Place into the nose.   INDOMETHACIN (INDOCIN SR) 75 MG CR CAPSULE    Take by mouth.   INSULIN DETEMIR (LEVEMIR) 100 UNIT/ML INJECTION    Inject 0.65 mLs (65 Units total) into the skin 2 (two) times daily.   INVOKANA 300 MG TABS TABLET    TAKE ONE TABLET BY MOUTH EVERY DAY IN THE MORNING.   LISINOPRIL (PRINIVIL,ZESTRIL) 5 MG TABLET    Take by mouth.   LORATADINE (CLARITIN) 10 MG TABLET    Take by mouth.   METFORMIN (GLUCOPHAGE) 1000 MG TABLET    Take by mouth.   NEOMYCIN-POLYMYXIN-HYDROCORTISONE (CORTISPORIN) OTIC SOLUTION    Place in ear(s).   OMEGA-3 FATTY  ACIDS (FISH OIL) 1200 MG CAPS    Take by mouth.   SPECIALTY VITAMINS PRODUCTS PO    Take by mouth.   TRIAMCINOLONE CREAM (KENALOG) 0.5 %    TRIAMCINOLONE ACETONIDE, 0.5% (External Cream)  1 application to affected area wo times daily, as needed for 0 days  Quantity: 30;  Refills: 1   Ordered :14-Jan-2014  Lelon Huh MD;  Started 14-Jan-2014 Active Comments: Medication taken as needed.    ZOLPIDEM (AMBIEN) 10 MG TABLET    TAKE 1/2 TO 1 TABLET BY MOUTH EVERY NIGHT AT BEDTIME    Review of Systems  Constitutional: Negative for fever, chills and appetite change.  HENT: Positive for congestion, ear discharge, postnasal drip, rhinorrhea, sinus  pressure and sore throat. Negative for ear pain.   Respiratory: Positive for cough and chest tightness. Negative for shortness of breath and wheezing.   Cardiovascular: Negative for chest pain and palpitations.  Gastrointestinal: Negative for nausea, vomiting and abdominal pain.  Neurological: Positive for headaches.    Social History  Substance Use Topics  . Smoking status: Former Smoker    Types: Cigars    Quit date: 04/08/2012  . Smokeless tobacco: Not on file     Comment: smokes cigars occasionally  . Alcohol Use: 0.0 oz/week    0 Standard drinks or equivalent per week     Comment: occassional   Objective:   BP 98/60 mmHg  Pulse 77  Temp(Src) 98.7 F (37.1 C) (Oral)  Resp 18  Ht 5\' 7"  (1.702 m)  Wt 259 lb (117.482 kg)  BMI 40.56 kg/m2  SpO2 93%  Physical Exam   General Appearance:    Alert, cooperative, no distress  Eyes:    PERRL, conjunctiva/corneas clear, EOM's intact       Lungs:     Occasional expiratory wheeze bilaterally, no rales or rhonchi, respirations unlabored  Heart:    Regular rate and rhythm  Neurologic:   Awake, alert, oriented x 3. No apparent focal neurological           defect.   HEENT:   Clear        Assessment & Plan:     1. Type 2 diabetes mellitus without complication, with long-term current use of insulin (HCC) Greatly improved with dietary changes and increase in basal insulin. Continue current medications.  Return in March to check A1c  2. B12 deficiency Advised to stop oral b12 until injectable B12 prescribed at [facility] is completed. Will check B12 level at follow up.   3. Bronchitis  - levofloxacin (LEVAQUIN) 500 MG tablet; Take 1 tablet (500 mg total) by mouth daily.  Dispense: 7 tablet; Refill: 0       Lelon Huh, MD  [facility]

## 2015-12-26 DIAGNOSIS — C44319 Basal cell carcinoma of skin of other parts of face: Secondary | ICD-10-CM | POA: Diagnosis not present

## 2016-01-02 DIAGNOSIS — L905 Scar conditions and fibrosis of skin: Secondary | ICD-10-CM | POA: Diagnosis not present

## 2016-01-02 DIAGNOSIS — D0439 Carcinoma in situ of skin of other parts of face: Secondary | ICD-10-CM | POA: Diagnosis not present

## 2016-02-09 ENCOUNTER — Encounter: Payer: Self-pay | Admitting: Family Medicine

## 2016-02-21 ENCOUNTER — Emergency Department (HOSPITAL_COMMUNITY): Payer: Commercial Managed Care - HMO

## 2016-02-21 ENCOUNTER — Inpatient Hospital Stay (HOSPITAL_COMMUNITY)
Admission: EM | Admit: 2016-02-21 | Discharge: 2016-02-24 | DRG: 041 | Disposition: A | Discharge status: Home / Self Care | Payer: Commercial Managed Care - HMO | Attending: Student in an Organized Health Care Education/Training Program | Admitting: Student in an Organized Health Care Education/Training Program

## 2016-02-21 ENCOUNTER — Observation Stay (HOSPITAL_COMMUNITY): Payer: Commercial Managed Care - HMO

## 2016-02-21 ENCOUNTER — Encounter (HOSPITAL_COMMUNITY): Payer: Self-pay | Admitting: Emergency Medicine

## 2016-02-21 DIAGNOSIS — I6522 Occlusion and stenosis of left carotid artery: Secondary | ICD-10-CM | POA: Diagnosis not present

## 2016-02-21 DIAGNOSIS — Q230 Congenital stenosis of aortic valve: Secondary | ICD-10-CM | POA: Diagnosis not present

## 2016-02-21 DIAGNOSIS — E538 Deficiency of other specified B group vitamins: Secondary | ICD-10-CM | POA: Diagnosis present

## 2016-02-21 DIAGNOSIS — Z6839 Body mass index (BMI) 39.0-39.9, adult: Secondary | ICD-10-CM

## 2016-02-21 DIAGNOSIS — S80852A Superficial foreign body, left lower leg, initial encounter: Secondary | ICD-10-CM

## 2016-02-21 DIAGNOSIS — G8324 Monoplegia of upper limb affecting left nondominant side: Secondary | ICD-10-CM | POA: Diagnosis present

## 2016-02-21 DIAGNOSIS — G4733 Obstructive sleep apnea (adult) (pediatric): Secondary | ICD-10-CM | POA: Diagnosis present

## 2016-02-21 DIAGNOSIS — I639 Cerebral infarction, unspecified: Secondary | ICD-10-CM

## 2016-02-21 DIAGNOSIS — Z9889 Other specified postprocedural states: Secondary | ICD-10-CM | POA: Diagnosis not present

## 2016-02-21 DIAGNOSIS — I10 Essential (primary) hypertension: Secondary | ICD-10-CM | POA: Diagnosis present

## 2016-02-21 DIAGNOSIS — R202 Paresthesia of skin: Secondary | ICD-10-CM | POA: Diagnosis not present

## 2016-02-21 DIAGNOSIS — M109 Gout, unspecified: Secondary | ICD-10-CM | POA: Diagnosis present

## 2016-02-21 DIAGNOSIS — I6529 Occlusion and stenosis of unspecified carotid artery: Secondary | ICD-10-CM | POA: Diagnosis present

## 2016-02-21 DIAGNOSIS — Z7982 Long term (current) use of aspirin: Secondary | ICD-10-CM | POA: Diagnosis not present

## 2016-02-21 DIAGNOSIS — Y92230 Patient room in hospital as the place of occurrence of the external cause: Secondary | ICD-10-CM | POA: Diagnosis not present

## 2016-02-21 DIAGNOSIS — E1142 Type 2 diabetes mellitus with diabetic polyneuropathy: Secondary | ICD-10-CM | POA: Diagnosis not present

## 2016-02-21 DIAGNOSIS — J309 Allergic rhinitis, unspecified: Secondary | ICD-10-CM | POA: Diagnosis present

## 2016-02-21 DIAGNOSIS — Z22322 Carrier or suspected carrier of Methicillin resistant Staphylococcus aureus: Secondary | ICD-10-CM

## 2016-02-21 DIAGNOSIS — Z87891 Personal history of nicotine dependence: Secondary | ICD-10-CM | POA: Diagnosis not present

## 2016-02-21 DIAGNOSIS — E1151 Type 2 diabetes mellitus with diabetic peripheral angiopathy without gangrene: Secondary | ICD-10-CM | POA: Diagnosis present

## 2016-02-21 DIAGNOSIS — G459 Transient cerebral ischemic attack, unspecified: Secondary | ICD-10-CM | POA: Diagnosis present

## 2016-02-21 DIAGNOSIS — Z8673 Personal history of transient ischemic attack (TIA), and cerebral infarction without residual deficits: Secondary | ICD-10-CM | POA: Diagnosis present

## 2016-02-21 DIAGNOSIS — E1140 Type 2 diabetes mellitus with diabetic neuropathy, unspecified: Secondary | ICD-10-CM | POA: Diagnosis present

## 2016-02-21 DIAGNOSIS — W228XXA Striking against or struck by other objects, initial encounter: Secondary | ICD-10-CM | POA: Diagnosis not present

## 2016-02-21 DIAGNOSIS — E785 Hyperlipidemia, unspecified: Secondary | ICD-10-CM | POA: Diagnosis not present

## 2016-02-21 DIAGNOSIS — T1490 Injury, unspecified: Secondary | ICD-10-CM | POA: Diagnosis not present

## 2016-02-21 DIAGNOSIS — E669 Obesity, unspecified: Secondary | ICD-10-CM | POA: Diagnosis present

## 2016-02-21 DIAGNOSIS — S80851A Superficial foreign body, right lower leg, initial encounter: Secondary | ICD-10-CM

## 2016-02-21 DIAGNOSIS — Z888 Allergy status to other drugs, medicaments and biological substances status: Secondary | ICD-10-CM

## 2016-02-21 DIAGNOSIS — I2510 Atherosclerotic heart disease of native coronary artery without angina pectoris: Secondary | ICD-10-CM | POA: Diagnosis present

## 2016-02-21 DIAGNOSIS — I340 Nonrheumatic mitral (valve) insufficiency: Secondary | ICD-10-CM | POA: Diagnosis not present

## 2016-02-21 DIAGNOSIS — R200 Anesthesia of skin: Secondary | ICD-10-CM | POA: Diagnosis not present

## 2016-02-21 DIAGNOSIS — E119 Type 2 diabetes mellitus without complications: Secondary | ICD-10-CM | POA: Insufficient documentation

## 2016-02-21 DIAGNOSIS — M7989 Other specified soft tissue disorders: Secondary | ICD-10-CM | POA: Diagnosis not present

## 2016-02-21 DIAGNOSIS — I6789 Other cerebrovascular disease: Secondary | ICD-10-CM | POA: Diagnosis not present

## 2016-02-21 HISTORY — DX: Methicillin resistant Staphylococcus aureus infection, unspecified site: A49.02

## 2016-02-21 HISTORY — DX: Deficiency of other specified B group vitamins: E53.8

## 2016-02-21 HISTORY — DX: Cerebral infarction, unspecified: I63.9

## 2016-02-21 HISTORY — DX: Carrier or suspected carrier of methicillin resistant Staphylococcus aureus: Z22.322

## 2016-02-21 HISTORY — DX: Other specified postprocedural states: Z98.890

## 2016-02-21 HISTORY — DX: Polyneuropathy, unspecified: G62.9

## 2016-02-21 HISTORY — DX: Gout, unspecified: M10.9

## 2016-02-21 HISTORY — DX: Type 2 diabetes mellitus without complications: E11.9

## 2016-02-21 LAB — CBC
HCT: 45.7 % (ref 39.0–52.0)
HEMOGLOBIN: 15.4 g/dL (ref 13.0–17.0)
MCH: 30.6 pg (ref 26.0–34.0)
MCHC: 33.7 g/dL (ref 30.0–36.0)
MCV: 90.9 fL (ref 78.0–100.0)
PLATELETS: 195 10*3/uL (ref 150–400)
RBC: 5.03 MIL/uL (ref 4.22–5.81)
RDW: 13.7 % (ref 11.5–15.5)
WBC: 7.1 10*3/uL (ref 4.0–10.5)

## 2016-02-21 LAB — COMPREHENSIVE METABOLIC PANEL
ALBUMIN: 3.9 g/dL (ref 3.5–5.0)
ALK PHOS: 70 U/L (ref 38–126)
ALT: 23 U/L (ref 17–63)
AST: 23 U/L (ref 15–41)
Anion gap: 12 (ref 5–15)
BILIRUBIN TOTAL: 0.6 mg/dL (ref 0.3–1.2)
BUN: 13 mg/dL (ref 6–20)
CO2: 25 mmol/L (ref 22–32)
Calcium: 9.7 mg/dL (ref 8.9–10.3)
Chloride: 101 mmol/L (ref 101–111)
Creatinine, Ser: 0.83 mg/dL (ref 0.61–1.24)
GFR calc Af Amer: >60 mL/min (ref >60)
GFR calc non Af Amer: >60 mL/min (ref >60)
GLUCOSE: 159 mg/dL — AB (ref 65–99)
POTASSIUM: 4.5 mmol/L (ref 3.5–5.1)
SODIUM: 138 mmol/L (ref 135–145)
TOTAL PROTEIN: 7.2 g/dL (ref 6.5–8.1)

## 2016-02-21 LAB — PROTIME-INR
INR: 1.03 (ref 0.00–1.49)
Prothrombin Time: 13.7 seconds (ref 11.6–15.2)

## 2016-02-21 LAB — I-STAT CHEM 8, ED
BUN: 16 mg/dL (ref 6–20)
CALCIUM ION: 1.17 mmol/L (ref 1.13–1.30)
CHLORIDE: 99 mmol/L — AB (ref 101–111)
Creatinine, Ser: 0.7 mg/dL (ref 0.61–1.24)
Glucose, Bld: 153 mg/dL — ABNORMAL HIGH (ref 65–99)
HEMATOCRIT: 50 % (ref 39.0–52.0)
Hemoglobin: 17 g/dL (ref 13.0–17.0)
POTASSIUM: 4.3 mmol/L (ref 3.5–5.1)
SODIUM: 137 mmol/L (ref 135–145)
TCO2: 25 mmol/L (ref 0–100)

## 2016-02-21 LAB — URINALYSIS, ROUTINE W REFLEX MICROSCOPIC
Bilirubin Urine: NEGATIVE
Glucose, UA: >1000 mg/dL — AB
Hgb urine dipstick: NEGATIVE
Ketones, ur: NEGATIVE mg/dL
LEUKOCYTES UA: NEGATIVE
NITRITE: NEGATIVE
PH: 5 (ref 5.0–8.0)
Protein, ur: NEGATIVE mg/dL
SPECIFIC GRAVITY, URINE: 1.025 (ref 1.005–1.030)

## 2016-02-21 LAB — RAPID URINE DRUG SCREEN, HOSP PERFORMED
Amphetamines: NOT DETECTED
Barbiturates: NOT DETECTED
Benzodiazepines: NOT DETECTED
Cocaine: NOT DETECTED
OPIATES: NOT DETECTED
TETRAHYDROCANNABINOL: NOT DETECTED

## 2016-02-21 LAB — DIFFERENTIAL
BASOS ABS: 0 10*3/uL (ref 0.0–0.1)
Basophils Relative: 0 %
EOS ABS: 0.2 10*3/uL (ref 0.0–0.7)
Eosinophils Relative: 3 %
LYMPHS ABS: 1.6 10*3/uL (ref 0.7–4.0)
LYMPHS PCT: 22 %
Monocytes Absolute: 0.8 10*3/uL (ref 0.1–1.0)
Monocytes Relative: 11 %
NEUTROS PCT: 64 %
Neutro Abs: 4.6 10*3/uL (ref 1.7–7.7)

## 2016-02-21 LAB — LIPID PANEL
Cholesterol: 113 mg/dL (ref 0–200)
HDL: 34 mg/dL — AB (ref >40)
LDL CALC: 37 mg/dL (ref 0–99)
Total CHOL/HDL Ratio: 3.3 RATIO
Triglycerides: 209 mg/dL — ABNORMAL HIGH (ref <150)
VLDL: 42 mg/dL — AB (ref 0–40)

## 2016-02-21 LAB — URINE MICROSCOPIC-ADD ON

## 2016-02-21 LAB — I-STAT TROPONIN, ED: Troponin i, poc: 0 ng/mL (ref 0.00–0.08)

## 2016-02-21 LAB — HIV ANTIBODY (ROUTINE TESTING W REFLEX): HIV SCREEN 4TH GENERATION: NONREACTIVE

## 2016-02-21 LAB — MAGNESIUM: Magnesium: 1.7 mg/dL (ref 1.7–2.4)

## 2016-02-21 LAB — CBG MONITORING, ED
Glucose-Capillary: 143 mg/dL — ABNORMAL HIGH (ref 65–99)
Glucose-Capillary: 168 mg/dL — ABNORMAL HIGH (ref 65–99)
Glucose-Capillary: 165 mg/dL — ABNORMAL HIGH (ref 65–99)

## 2016-02-21 LAB — APTT: aPTT: 30 seconds (ref 24–37)

## 2016-02-21 LAB — VITAMIN B12: VITAMIN B 12: 1181 pg/mL — AB (ref 180–914)

## 2016-02-21 MED ORDER — STROKE: EARLY STAGES OF RECOVERY BOOK
Freq: Once | Status: AC
  Administered 2016-02-21: 09:00:00
  Filled 2016-02-21: qty 1

## 2016-02-21 MED ORDER — ASPIRIN 81 MG PO CHEW: 81.0000 mg | CHEWABLE_TABLET | Freq: Every day | ORAL | Status: DC

## 2016-02-21 MED ORDER — INSULIN ASPART 100 UNIT/ML ~~LOC~~ SOLN
0.0000 [IU] | Freq: Three times a day (TID) | SUBCUTANEOUS | Status: DC
  Administered 2016-02-21 – 2016-02-22 (×4): 3 [IU] via SUBCUTANEOUS
  Administered 2016-02-23: 2 [IU] via SUBCUTANEOUS
  Administered 2016-02-23: 3 [IU] via SUBCUTANEOUS
  Administered 2016-02-24: 2 [IU] via SUBCUTANEOUS

## 2016-02-21 MED ORDER — ATORVASTATIN CALCIUM 10 MG PO TABS
20.0000 mg | ORAL_TABLET | Freq: Every day | ORAL | Status: DC
  Filled 2016-02-21: qty 2

## 2016-02-21 MED ORDER — ACETAMINOPHEN 650 MG RE SUPP: 650.0000 mg | RECTAL | Status: DC | PRN

## 2016-02-21 MED ORDER — CLOPIDOGREL BISULFATE 75 MG PO TABS: 75.0000 mg | ORAL_TABLET | Freq: Every day | ORAL | Status: DC

## 2016-02-21 MED ORDER — ENOXAPARIN SODIUM 40 MG/0.4ML ~~LOC~~ SOLN
40.0000 mg | SUBCUTANEOUS | Status: DC
  Administered 2016-02-21 – 2016-02-22 (×2): 40 mg via SUBCUTANEOUS
  Filled 2016-02-21 (×2): qty 0.4

## 2016-02-21 MED ORDER — ATORVASTATIN CALCIUM 40 MG PO TABS
40.0000 mg | ORAL_TABLET | Freq: Every day | ORAL | Status: DC
  Administered 2016-02-21 – 2016-02-24 (×4): 40 mg via ORAL
  Filled 2016-02-21 (×3): qty 1

## 2016-02-21 MED ORDER — INSULIN DETEMIR 100 UNIT/ML ~~LOC~~ SOLN
50.0000 [IU] | Freq: Two times a day (BID) | SUBCUTANEOUS | Status: DC
  Administered 2016-02-21 – 2016-02-24 (×7): 50 [IU] via SUBCUTANEOUS
  Filled 2016-02-21 (×8): qty 0.5

## 2016-02-21 MED ORDER — SENNOSIDES-DOCUSATE SODIUM 8.6-50 MG PO TABS
1.0000 | ORAL_TABLET | Freq: Every evening | ORAL | Status: DC | PRN
  Filled 2016-02-21: qty 1

## 2016-02-21 MED ORDER — ACETAMINOPHEN 325 MG PO TABS: 650.0000 mg | ORAL_TABLET | ORAL | Status: DC | PRN

## 2016-02-21 MED ORDER — LORATADINE 10 MG PO TABS
10.0000 mg | ORAL_TABLET | Freq: Every day | ORAL | Status: DC
  Administered 2016-02-21 – 2016-02-24 (×4): 10 mg via ORAL
  Filled 2016-02-21 (×4): qty 1

## 2016-02-21 MED ORDER — IOHEXOL 350 MG/ML SOLN
50.0000 mL | Freq: Once | INTRAVENOUS | Status: AC | PRN
  Administered 2016-02-21: 50 mL via INTRAVENOUS

## 2016-02-21 NOTE — H&P (Signed)
Date: 02/21/2016               Patient Name:  Sean Hayden MRN: [id number]  DOB: 02/16/1946 Age / Sex: 70 y.o., male   PCP: No primary care provider on file.         Medical Service: Internal Medicine Teaching Service         Attending Physician: Dr. Axel Filler, MD    First Contact: Dr. Benjamine Mola Pager: [phone]  Second Contact: Dr. Aurelio Brash  Pager: [phone]       After Hours (After 5p/  First Contact Pager: [phone]  weekends / holidays): Second Contact Pager: [phone]   Chief Complaint: left arm numbness  History of Present Illness:    Sean Hayden is a 70 year old man with past medical history of hypertension, hyperlipidemia, insulin-dependent Type 2 DM, right carotid endarterectomy, and non-tophaceous gout who presents with left arm numbness.   He reports being in his normal state of health until 12:45 AM last night when he began having left arm numbness, tingling, and weakness while watching a movie. He thought he was having a heart attack and called EMS. He took two baby aspirin and then took another 2 shortly thereafter. He also had left mouth tingling that lasted only 5-10 seconds with associated dysarthria. His left arm symptoms resolved after about 10-15 minutes. He denies blurry vision, double vision, dizziness, headache, chest pain, palpitations, dysphagia, dizziness, confusion, or imbalance. He denies prior history of CVA/TIA. He had right endarterectomy about 3 years ago due to blurry vision. CTA neck on 06/06/15 revealed interval right endarterectomy without residual stenosis, mild left carotid bifurcation plaques, and moderate right vertebral artery stenosis. He has been taking aspirin 81 mg BID for the past year and before that was taking it daily. He is compliant with taking anti-hypertensive, statin, and diabetic medications. He reports having chronic peripheral diabetic neuropathy which he applies capsaicin cream. He used to smoke cigarettes (2 ppd) and cigars for >10  yrs but quit 3 years ago. He was also a heavy drinker but also quit 3 years ago. He denies recreational drug use. He denies family history of stroke. [facility] is Dr. Caryn Section in [facility].   On admission to the ED he was hypertensive to 167/75. CT head on admission revealed no acute finding but did show chronic small vessel disease and remote left MCA/watershed territory infarct. He was not given tPA due to resolution of symptoms. He currently denies any symptoms and feels back to his normal self.    Meds:  No current facility-administered medications on file prior to encounter.   No current outpatient prescriptions on file prior to encounter.     Allergies: Allergies as of 02/21/2016 - Review Complete 02/21/2016  Allergen Reaction Noted  . Actos [pioglitazone]  02/21/2016   Past Medical History  Diagnosis Date  . Diabetes mellitus without complication ([place])   . Hypertension   . Gout   . MRSA infection   . Neuropathy ([place])   . TIA (transient ischemic attack)    Past Surgical History  Procedure Laterality Date  . Carotid artery angioplasty     No family history on file. Social History   Social History  . Marital Status: Married    Spouse Name: N/A  . Number of Children: N/A  . Years of Education: N/A   Occupational History  . Not on file.   Social History Main Topics  . Smoking status: Former Research scientist (life sciences)  .  Smokeless tobacco: Not on file  . Alcohol Use: No  . Drug Use: No  . Sexual Activity: Not on file   Other Topics Concern  . Not on file   Social History Narrative  . No narrative on file    Review of Systems: Review of Systems  Constitutional: Negative for fever and chills.  Eyes: Negative for blurred vision and double vision.  Respiratory: Negative for cough, shortness of breath and wheezing.   Cardiovascular: Positive for leg swelling (chronic pedal edema). Negative for chest pain and palpitations.  Gastrointestinal: Negative  for nausea, vomiting, abdominal pain, diarrhea, constipation and blood in stool.  Genitourinary: Negative for dysuria, urgency, frequency and hematuria.  Musculoskeletal: Negative for myalgias.  Skin: Negative for rash.  Neurological: Negative for dizziness, sensory change, speech change, focal weakness and headaches.  Psychiatric/Behavioral: Negative for substance abuse.      Physical Exam: Blood pressure 149/80, pulse 87, temperature 98.2 F (36.8 C), temperature source Oral, resp. rate 9, SpO2 95 %.  Physical Exam  Constitutional: He is oriented to person, place, and time. He appears well-developed and well-nourished. No distress.  HENT:  Head: Normocephalic and atraumatic.  Right Ear: External ear normal.  Left Ear: External ear normal.  Nose: Nose normal.  Mouth/Throat: Oropharynx is clear and moist. No oropharyngeal exudate.  Eyes: Conjunctivae and EOM are normal. Pupils are equal, round, and reactive to light. Right eye exhibits no discharge. Left eye exhibits no discharge. No scleral icterus.  Neck: Normal range of motion. Neck supple.  Cardiovascular: Normal rate and regular rhythm.   Murmur heard. Pulmonary/Chest: Effort normal and breath sounds normal. No respiratory distress. He has no wheezes. He has no rales.  Abdominal: Soft. Bowel sounds are normal. He exhibits no distension. There is no tenderness. There is no rebound and no guarding.  Umbilical hernia with no signs of incarceration  Musculoskeletal: Normal range of motion. He exhibits edema (trace pedal ). He exhibits no tenderness.  Neurological: He is alert and oriented to person, place, and time. He has normal reflexes. He displays normal reflexes. No cranial nerve deficit. He exhibits normal muscle tone.  No dysarthria with normal repetition. Pronator drift negative. Normal 5/5 muscle strength throughout. Normal sensation to light touch of extremities.   Skin: Skin is warm and dry. No rash noted. He is not  diaphoretic. No erythema. No pallor.  Psychiatric: He has a normal mood and affect. His behavior is normal. Judgment and thought content normal.     Lab results: Basic Metabolic Panel:  Recent Labs  02/21/16 0304 02/21/16 0315  NA 138 137  K 4.5 4.3  CL 101 99*  CO2 25  --   GLUCOSE 159* 153*  BUN 13 16  CREATININE 0.83 0.70  CALCIUM 9.7  --    Liver Function Tests:  Recent Labs  02/21/16 0304  AST 23  ALT 23  ALKPHOS 70  BILITOT 0.6  PROT 7.2  ALBUMIN 3.9  CBC:  Recent Labs  02/21/16 0304 02/21/16 0315  WBC 7.1  --   NEUTROABS 4.6  --   HGB 15.4 17.0  HCT 45.7 50.0  MCV 90.9  --   PLT 195  --    CBG:  Recent Labs  02/21/16 0744  GLUCAP 143*   Coagulation:  Recent Labs  02/21/16 0304  LABPROT 13.7  INR 1.03   Urinalysis:  Recent Labs  02/21/16 0308  COLORURINE STRAW*  LABSPEC 1.025  PHURINE 5.0  GLUCOSEU >1000*  HGBUR NEGATIVE  BILIRUBINUR NEGATIVE  KETONESUR NEGATIVE  PROTEINUR NEGATIVE  NITRITE NEGATIVE  LEUKOCYTESUR NEGATIVE     Imaging results:  Ct Head Wo Contrast  02/21/2016  CLINICAL DATA:  Left arm numbness for 15 minutes. Symptoms have resolved. Initial encounter. EXAM: CT HEAD WITHOUT CONTRAST TECHNIQUE: Contiguous axial images were obtained from the base of the skull through the vertex without intravenous contrast. COMPARISON:  None. FINDINGS: Skull and Sinuses:No acute osseous finding. Moderate patchy sinusitis. Visualized orbits: Left cataract resection.  No acute finding. Brain: No evidence of acute infarction, hemorrhage, hydrocephalus, or mass lesion/mass effect. There is cortical and subcortical gliosis in the high left cerebral hemisphere from remote infarct, at the upper border zone territory. There is extensive patchy small vessel ischemic change in the cerebral white matter. Intracranial calcified atherosclerosis. No explanation for left arm numbness. A left M2 branch is notably high-density at the insular level but  presumably atherosclerotic/chronic given the asymmetric symptomatology. IMPRESSION: 1. No acute finding. 2. Chronic small vessel disease and remote left MCA/watershed territory infarct. Electronically Signed   By: Monte Fantasia M.D.   On: 02/21/2016 04:35    Other results: EKG:   Ventricular Rate: 79 PR Interval: 176 QRS Duration: 90 QT Interval: 368 QTC Calculation: 421 R Axis: 84 Text Interpretation: Normal sinus rhythm Normal ECG No old tracing to compare   Assessment & Plan by Problem:  Left arm paraesthesias and paresis - Pt with <24 hr history of left arm paraesthesias and paresis with resolution after 15 minutes. Pt did not receive tPA on arrival due to resolution of symptoms. Pt with CT head evidence of prior left MCA infarct in setting of chronic small vessel disease. Etiology most likely TIA vs right sided ischemic CVA. Pt with risk factors of hypertension, hyperlipidemia, diabetes, obesity, and past tobacco abuse. He has history of right carotid endarterectomy. Pt was compliant with taking aspirin 81 mg BID at home. -Monitor on telemetry  -Obtain stat MRI brain w/o cont & MR MRA head/brain w/o cont -Obtain 2D-echo and carotid Doppler US   -Obtain A1c and lipid panel  -Allow permissive hypertension to BP 220/120  -Will change aspirin 81 mg BID to plavix 75 mg daily due to aspirin failure  -Obtain PT and OT consults  -Consult neurology for further recommendations if MRI indicative of CVA  Hypertension - Currently mildly hypertensive. Pt at home on lisinopril 5 mg daily.  -Allow permissive hypertension to BP 220/120  -Hold home lisinopril 5 mg daily   Hyperlipidemia - Last lipid panel on 11/23/15 with LDL 52.  -Obtain lipid panel  -Increase atorvastatin 20 mg daily to high intensity 40 mg in setting of ASCVD   Insulin-dependent Type 2 DM with chronic peripheral neuropathy - Last CBG 143. Last A1c 7.3 on 11/23/15. Pt at home on Levemir 65 U BID, metformin 1000 mg TID,  and canagliflozin 300 mg daily.  -Obtain A1c -Start Levemir 50 U BID and moderate SSI with meals -Hold home metformin and canagliflozin -Monitor CBG before meals and at bedtime  Diet: Carb modified  DVT Ppx: Lovenox  Code: Full (need to confirm)   Dispo: Disposition is deferred at this time, awaiting improvement of current medical problems. Anticipated discharge in approximately 1 day(s).   The patient does have a current PCP (No primary care provider on file.) and does need an [facility] hospital follow-up appointment after discharge.  The patient does have transportation limitations that hinder transportation to clinic appointments.  Signed: Juluis Mire, MD 02/21/2016, 8:21 AM

## 2016-02-21 NOTE — Progress Notes (Signed)
Patients MRSA nasal swab collected and sent to lab. Contact precautions initiated per protocol.

## 2016-02-21 NOTE — ED Notes (Addendum)
Per ems-- pt had episode of L arm numbness and L jaw numbness lasting approx 15 mins at 0045 today. Pt took 324 asa pta and symptoms have resolved. Denies pain. 12 lead unremarkable. No drift or facial droop noted. Speech clear.

## 2016-02-21 NOTE — ED Provider Notes (Signed)
CSN: [id number]     Arrival date & time 02/21/16  0256 History   First MD Initiated Contact with Patient 02/21/16 [phone]     Chief Complaint  Patient presents with  . Numbness     (Consider location/radiation/quality/duration/timing/severity/associated sxs/prior Treatment) HPI   Blood pressure 167/75, pulse 88, temperature 98.2 F (36.8 C), temperature source Oral, resp. rate 20, SpO2 94 %.  Domani Kirvin is a 70 y.o. male past medical history significant for diabetes, hypertension, hyperlipidemia, former smoker complaining of left arm paresthesia with associated weakness onset at 12:45 AM while patient was watching a movie. It lasted approximately 15 minutes and resolved spontaneously. It was associated with a left jaw numbness and slurred speech this lasted for just a few seconds. Patient took 2 baby aspirin and then 2 more within the hour. All symptoms are currently resolved. He denies any change in his vision, active dysarthria, ataxia, chest pain, shortness of breath, abdominal pain, nausea, vomiting, anticoagulation.   PCP: Caryn Section in [place]  Past Medical History  Diagnosis Date  . Diabetes mellitus without complication ([place])   . Hypertension   . Gout   . MRSA infection   . Neuropathy ([place])   . TIA (transient ischemic attack)    Past Surgical History  Procedure Laterality Date  . Carotid artery angioplasty     No family history on file. Social History  Substance Use Topics  . Smoking status: Former Research scientist (life sciences)  . Smokeless tobacco: None  . Alcohol Use: No    Review of Systems  10 systems reviewed and found to be negative, except as noted in the HPI.   Allergies  Actos  Home Medications   Prior to Admission medications   Not on File   BP 167/75 mmHg  Pulse 88  Temp(Src) 98.2 F (36.8 C) (Oral)  Resp 20  SpO2 94% Physical Exam  Constitutional: He is oriented to person, place, and time. He appears well-developed and well-nourished.  HENT:  Head:  Normocephalic and atraumatic.  Mouth/Throat: Oropharynx is clear and moist.  Eyes: Conjunctivae and EOM are normal. Pupils are equal, round, and reactive to light.  No TTP of maxillary or frontal sinuses  No TTP or induration of temporal arteries bilaterally  Neck: Normal range of motion. Neck supple.  FROM to C-spine. Pt can touch chin to chest without discomfort. No TTP of midline cervical spine.   Cardiovascular: Normal rate, regular rhythm and intact distal pulses.   Pulmonary/Chest: Effort normal and breath sounds normal. No respiratory distress. He has no wheezes. He has no rales. He exhibits no tenderness.  Abdominal: Soft. Bowel sounds are normal. There is no tenderness.  Musculoskeletal: Normal range of motion. He exhibits no edema or tenderness.  Neurological: He is alert and oriented to person, place, and time. No cranial nerve deficit.  II-Visual fields grossly intact. III/IV/VI-Extraocular movements intact.  Pupils reactive bilaterally. V/VII-Smile symmetric, equal eyebrow raise,  facial sensation intact VIII- Hearing grossly intact IX/X-Normal gag XI-bilateral shoulder shrug XII-midline tongue extension Motor: 5/5 bilaterally with normal tone and bulk Cerebellar: Normal finger-to-nose  and normal heel-to-shin test.   Romberg negative Ambulates with a coordinated gait   Nursing note and vitals reviewed.   ED Course  Procedures (including critical care time) Labs Review Labs Reviewed  COMPREHENSIVE METABOLIC PANEL - Abnormal; Notable for the following:    Glucose, Bld 159 (*)    All other components within normal limits  URINALYSIS, ROUTINE W REFLEX MICROSCOPIC (NOT AT [facility]) - Abnormal; Notable for  the following:    Color, Urine STRAW (*)    Glucose, UA >1000 (*)    All other components within normal limits  URINE MICROSCOPIC-ADD ON - Abnormal; Notable for the following:    Squamous Epithelial / LPF 0-5 (*)    Bacteria, UA RARE (*)    All other components within  normal limits  I-STAT CHEM 8, ED - Abnormal; Notable for the following:    Chloride 99 (*)    Glucose, Bld 153 (*)    All other components within normal limits  PROTIME-INR  APTT  CBC  DIFFERENTIAL  I-STAT TROPOININ, ED  CBG MONITORING, ED    Imaging Review Ct Head Wo Contrast  02/21/2016  CLINICAL DATA:  Left arm numbness for 15 minutes. Symptoms have resolved. Initial encounter. EXAM: CT HEAD WITHOUT CONTRAST TECHNIQUE: Contiguous axial images were obtained from the base of the skull through the vertex without intravenous contrast. COMPARISON:  None. FINDINGS: Skull and Sinuses:No acute osseous finding. Moderate patchy sinusitis. Visualized orbits: Left cataract resection.  No acute finding. Brain: No evidence of acute infarction, hemorrhage, hydrocephalus, or mass lesion/mass effect. There is cortical and subcortical gliosis in the high left cerebral hemisphere from remote infarct, at the upper border zone territory. There is extensive patchy small vessel ischemic change in the cerebral white matter. Intracranial calcified atherosclerosis. No explanation for left arm numbness. A left M2 branch is notably high-density at the insular level but presumably atherosclerotic/chronic given the asymmetric symptomatology. IMPRESSION: 1. No acute finding. 2. Chronic small vessel disease and remote left MCA/watershed territory infarct. Electronically Signed   By: Monte Fantasia M.D.   On: 02/21/2016 04:35   I have personally reviewed and evaluated these images and lab results as part of my medical decision-making.   EKG Interpretation   Date/Time:  Wednesday February 21 2016 03:01:56 EDT Ventricular Rate:  79 PR Interval:  176 QRS Duration: 90 QT Interval:  368 QTC Calculation: 421 R Axis:   84 Text Interpretation:  Normal sinus rhythm Normal ECG No old tracing to  compare Confirmed by Glynn Octave [phone]) on 02/21/2016 6:43:28 AM      MDM   Final diagnoses:  Transient cerebral  ischemia, unspecified transient cerebral ischemia type    Filed Vitals:   02/21/16 0258  BP: 167/75  Pulse: 88  Temp: 98.2 F (36.8 C)  TempSrc: Oral  Resp: 20  SpO2: 94%     Harvy Magnano is 70 y.o. male presenting with 15 minutes of left arm numbness and weakness and associated slurred speech for approximately 10 seconds. Symptoms are resolved. Give himself a dose aspirin this morning. My neuro exam is nonfocal. CT head with chronic small vessel disease and remote remote left MCA infarct (patient states he is not aware of ever having a stroke) will need admission for TIA workup.  This is a shared visit with the attending physician who personally evaluated the patient and agrees with the care plan.    Elmyra Ricks Bufford Helms, PA-C 02/21/16 [id number]  Everlene Balls, MD 02/21/16 [phone]

## 2016-02-21 NOTE — Consult Note (Signed)
Requesting Physician: Dr. Evette Doffing    Chief Complaint: CVA  History obtained from:  Patient    HPI:                                                                                                                                         Sean Hayden is an 70 y.o. male who reports being in his normal state of health until 12:45 AM last night when he began having left arm numbness, tingling, and weakness while watching a movie. He thought he was having a heart attack and called EMS. He took two baby aspirin. He also had left mouth tingling that lasted only 5-10 seconds with associated dysarthria. His left arm symptoms resolved after about 10-15 minutes.  He had right endarterectomy about 3 years ago due to blurry vision. CTA neck on 06/06/15 revealed interval right endarterectomy without residual stenosis, mild left carotid bifurcation plaques, and moderate right vertebral artery stenosis. He has been taking aspirin 81 mg BID for the past year and before that was taking it daily. He is compliant with taking anti-hypertensive, statin, and diabetic medications. On admission to the ED he was hypertensive to 167/75. CT head on admission revealed no acute finding but did show chronic small vessel disease and remote left MCA/watershed territory infarct. He was not given tPA due to resolution of symptoms.  Date last known well: 3.14.2017 Time last known well: Time: 24:45 tPA Given: No: resolution of symptoms      Past Medical History  Diagnosis Date  . Diabetes mellitus without complication ([place])   . Hypertension   . Gout   . MRSA infection   . Neuropathy ([place])   . History of right-sided carotid endarterectomy 04/17/12  . B12 deficiency   . OSA (obstructive sleep apnea)   . Diverticulosis   . Hemorrhoids   . Nephrolithiasis     Past Surgical History  Procedure Laterality Date  . Carotid artery angioplasty Right 04/17/2012    Dr. Delana Meyer, [facility]  . Hemorrhoid surgery  2009  . Cholecystectomy  2005     lap    Family History  Problem Relation Age of Onset  . Hodgkin's lymphoma Father    Social History:  reports that he has quit smoking. He does not have any smokeless tobacco history on file. He reports that he does not drink alcohol or use illicit drugs.  Allergies:  Allergies  Allergen Reactions  . Actos [Pioglitazone]     Medications:  Current Facility-Administered Medications  Medication Dose Route Frequency Provider Last Rate Last Dose  . acetaminophen (TYLENOL) tablet 650 mg  650 mg Oral Q4H PRN Juluis Mire, MD       Or  . acetaminophen (TYLENOL) suppository 650 mg  650 mg Rectal Q4H PRN Marjan Rabbani, MD      . atorvastatin (LIPITOR) tablet 40 mg  40 mg Oral Daily Marjan Rabbani, MD   40 mg at 02/21/16 0920  . [START ON 02/22/2016] clopidogrel (PLAVIX) tablet 75 mg  75 mg Oral Daily Marjan Rabbani, MD      . enoxaparin (LOVENOX) injection 40 mg  40 mg Subcutaneous Q24H Marjan Rabbani, MD   40 mg at 02/21/16 1134  . insulin aspart (novoLOG) injection 0-15 Units  0-15 Units Subcutaneous TID WC Marjan Rabbani, MD      . insulin detemir (LEVEMIR) injection 50 Units  50 Units Subcutaneous BID Juluis Mire, MD   50 Units at 02/21/16 1133  . loratadine (CLARITIN) tablet 10 mg  10 mg Oral Daily Marjan Rabbani, MD   10 mg at 02/21/16 0920  . senna-docusate (Senokot-S) tablet 1 tablet  1 tablet Oral QHS PRN Juluis Mire, MD       Current Outpatient Prescriptions  Medication Sig Dispense Refill  . acetaminophen (TYLENOL) 650 MG CR tablet Take 1,300 mg by mouth 2 (two) times daily.    [name] acyclovir (ZOVIRAX) 200 MG capsule Take 200 mg by mouth 5 (five) times daily as needed (for fever blisters).    [name] atorvastatin (LIPITOR) 40 MG tablet Take 20 mg by mouth daily.    . canagliflozin (INVOKANA) 300 MG TABS tablet Take 300 mg by mouth daily before breakfast.      . Cholecalciferol (VITAMIN D PO) Take 1 tablet by mouth daily.    . Cyanocobalamin (VITAMIN B 12 PO) Take 1 tablet by mouth 2 (two) times daily.    . Flaxseed, Linseed, (FLAXSEED OIL) 1000 MG CAPS Take 1,000 mg by mouth 2 (two) times daily.    . insulin detemir (LEVEMIR) 100 UNIT/ML injection Inject 65 Units into the skin 2 (two) times daily.    [name] lisinopril (PRINIVIL,ZESTRIL) 5 MG tablet Take 5 mg by mouth daily.    [name] loratadine (CLARITIN) 10 MG tablet Take 10 mg by mouth daily.    . metFORMIN (GLUCOPHAGE) 1000 MG tablet Take 1,000 mg by mouth 3 (three) times daily.    . Misc Natural Products (OSTEO BI-FLEX TRIPLE STRENGTH PO) Take 1 tablet by mouth 2 (two) times daily.    . Omega-3 Fatty Acids (FISH OIL) 1200 MG CAPS Take 2,400 mg by mouth daily.    [name] UNABLE TO FIND Take 2 capsules by mouth daily at 2 am. Med Name: prosvent [id number]       ROS:  History obtained from the patient  General ROS: negative for - chills, fatigue, fever, night sweats, weight gain or weight loss Psychological ROS: negative for - behavioral disorder, hallucinations, memory difficulties, mood swings or suicidal ideation Ophthalmic ROS: negative for - blurry vision, double vision, eye pain or loss of vision ENT ROS: negative for - epistaxis, nasal discharge, oral lesions, sore throat, tinnitus or vertigo Allergy and Immunology ROS: negative for - hives or itchy/watery eyes Hematological and Lymphatic ROS: negative for - bleeding problems, bruising or swollen lymph nodes Endocrine ROS: negative for - galactorrhea, hair pattern changes, polydipsia/polyuria or temperature intolerance Respiratory ROS: negative for - cough, hemoptysis, shortness of breath or wheezing Cardiovascular ROS: negative for - chest pain, dyspnea on exertion, edema or irregular heartbeat Gastrointestinal ROS: negative  for - abdominal pain, diarrhea, hematemesis, nausea/vomiting or stool incontinence Genito-Urinary ROS: negative for - dysuria, hematuria, incontinence or urinary frequency/urgency Musculoskeletal ROS: negative for - joint swelling or muscular weakness Neurological ROS: as noted in HPI Dermatological ROS: negative for rash and skin lesion changes  Neurologic Examination:                                                                                                      Blood pressure 156/70, pulse 86, temperature 98.2 F (36.8 C), temperature source Oral, resp. rate 21, SpO2 95 %.  HEENT-  Normocephalic, no lesions, without obvious abnormality.  Normal external eye and conjunctiva.  Normal TM's bilaterally.  Normal auditory canals and external ears. Normal external nose, mucus membranes and septum.  Normal pharynx. Cardiovascular- S1, S2 normal, pulses palpable throughout   Lungs- chest clear, no wheezing, rales, normal symmetric air entry Abdomen- normal findings: bowel sounds normal Extremities- no edema Lymph-no adenopathy palpable Musculoskeletal-no joint tenderness, deformity or swelling Skin-warm and dry, no hyperpigmentation, vitiligo, or suspicious lesions  Neurological Examination Mental Status: Alert, oriented, thought content appropriate.  Speech fluent without evidence of aphasia.  Able to follow 3 step commands without difficulty. Cranial Nerves: II: Discs flat bilaterally; Visual fields grossly normal, pupils equal, round, reactive to light and accommodation III,IV, VI: ptosis not present, extra-ocular motions intact bilaterally V,VII: smile symmetric, facial light touch sensation normal bilaterally VIII: hearing normal bilaterally IX,X: uvula rises symmetrically XI: bilateral shoulder shrug XII: midline tongue extension Motor: Right : Upper extremity   5/5    Left:     Upper extremity   5/5  Lower extremity   5/5     Lower extremity   5/5 Tone and bulk:normal tone  throughout; no atrophy noted Sensory: Pinprick and light touch intact throughout UE with significant bilateral LE neuropathy from DM Deep Tendon Reflexes: 1+ and symmetric throughout UE with no LE DTR Plantars: Mute bilaterally Cerebellar: normal finger-to-nose, and normal heel-to-shin test Gait: not tested       Lab Results: Basic Metabolic Panel:  Recent Labs Lab 02/21/16 0304 02/21/16 0315 02/21/16 0828  NA 138 137  --   K 4.5 4.3  --   CL 101 99*  --   CO2 25  --   --  GLUCOSE 159* 153*  --   BUN 13 16  --   CREATININE 0.83 0.70  --   CALCIUM 9.7  --   --   MG  --   --  1.7    Liver Function Tests:  Recent Labs Lab 02/21/16 0304  AST 23  ALT 23  ALKPHOS 70  BILITOT 0.6  PROT 7.2  ALBUMIN 3.9   No results for input(s): LIPASE, AMYLASE in the last 168 hours. No results for input(s): AMMONIA in the last 168 hours.  CBC:  Recent Labs Lab 02/21/16 0304 02/21/16 0315  WBC 7.1  --   NEUTROABS 4.6  --   HGB 15.4 17.0  HCT 45.7 50.0  MCV 90.9  --   PLT 195  --     Cardiac Enzymes: No results for input(s): CKTOTAL, CKMB, CKMBINDEX, TROPONINI in the last 168 hours.  Lipid Panel:  Recent Labs Lab 02/21/16 0828  CHOL 113  TRIG 209*  HDL 34*  CHOLHDL 3.3  VLDL 42*  LDLCALC 37    CBG:  Recent Labs Lab 02/21/16 0744  GLUCAP 143*    Microbiology: No results found for this or any previous visit.  Coagulation Studies:  Recent Labs  02/21/16 0304  LABPROT 13.7  INR 1.03    Imaging: Dg Tibia/fibula Left  02/21/2016  CLINICAL DATA:  Concern for metallic foreign body in left lower extremity EXAM: LEFT TIBIA AND FIBULA - 2 VIEW COMPARISON:  None. FINDINGS: Frontal and lateral views were obtained. There is a linear metallic foreign body posterior and medial to the proximal tibial diaphysis consistent with a small wire fragment. This fragment measures 5 mm in length. No other radiopaque foreign body evident. There is evidence of old trauma  involving the proximal tibial diaphysis with benign-appearing periosteal reaction in this area. No acute fracture or dislocation. No blastic or lytic bone lesion. There is degenerative change in the visualized left knee joint. IMPRESSION: 5 mm long linear metallic foreign body posterior and medial to the proximal tibia. Evidence of old trauma involving the proximal to mid tibial diaphysis with benign-appearing periosteal reaction this area. No acute fracture or dislocation. Moderate osteoarthritic change noted in visualized left knee joint. Electronically Signed   By: Lowella Grip III M.D.   On: 02/21/2016 10:11   Ct Head Wo Contrast  02/21/2016  CLINICAL DATA:  Left arm numbness for 15 minutes. Symptoms have resolved. Initial encounter. EXAM: CT HEAD WITHOUT CONTRAST TECHNIQUE: Contiguous axial images were obtained from the base of the skull through the vertex without intravenous contrast. COMPARISON:  None. FINDINGS: Skull and Sinuses:No acute osseous finding. Moderate patchy sinusitis. Visualized orbits: Left cataract resection.  No acute finding. Brain: No evidence of acute infarction, hemorrhage, hydrocephalus, or mass lesion/mass effect. There is cortical and subcortical gliosis in the high left cerebral hemisphere from remote infarct, at the upper border zone territory. There is extensive patchy small vessel ischemic change in the cerebral white matter. Intracranial calcified atherosclerosis. No explanation for left arm numbness. A left M2 branch is notably high-density at the insular level but presumably atherosclerotic/chronic given the asymmetric symptomatology. IMPRESSION: 1. No acute finding. 2. Chronic small vessel disease and remote left MCA/watershed territory infarct. Electronically Signed   By: Monte Fantasia M.D.   On: 02/21/2016 04:35   Mr Brain Wo Contrast  02/21/2016  CLINICAL DATA:  Left arm weakness, resolved. Diabetes, hypertension, hyperlipidemia, former smoker EXAM: MRI HEAD  WITHOUT CONTRAST MRA HEAD WITHOUT CONTRAST TECHNIQUE: Multiplanar, multiecho pulse  sequences of the brain and surrounding structures were obtained without intravenous contrast. Angiographic images of the head were obtained using MRA technique without contrast. COMPARISON:  CT head 02/21/2016 FINDINGS: MRI HEAD FINDINGS Small focus of acute infarct in the high left medial parietal lobe. No other area of acute infarct. Chronic infarct over the left frontal convexity. Moderate chronic microvascular ischemic changes throughout the white matter. Mild chronic ischemia in the basal ganglia. Cerebellum and brainstem show no evidence of infarction Chronic micro hemorrhage right caudate. No other areas of hemorrhage. Negative for mass or edema. Generalized atrophy.  No hydrocephalus.  Pituitary normal in size. Moderate mucosal edema paranasal sinuses. Mastoid sinus clear. No skull lesion. MRA HEAD FINDINGS Both vertebral arteries patent to the basilar. Left vertebral dominant. Left PICA patent. Right PICA not visualized. Basilar artery patent. Superior cerebellar and posterior cerebral arteries patent without stenosis. Internal carotid artery patent bilaterally without stenosis. Anterior and middle cerebral arteries patent without stenosis. Negative for cerebral aneurysm. IMPRESSION: Small area of acute infarct in the right medial parietal lobe measuring 5 mm. No other area of acute infarct Chronic ischemic changes throughout the white matter. Chronic cortical infarct over the left frontal convexity. Chronic micro hemorrhage right caudate. Electronically Signed   By: Franchot Gallo M.D.   On: 02/21/2016 11:28   Mr Jodene Nam Head/brain Wo Cm  02/21/2016  CLINICAL DATA:  Left arm weakness, resolved. Diabetes, hypertension, hyperlipidemia, former smoker EXAM: MRI HEAD WITHOUT CONTRAST MRA HEAD WITHOUT CONTRAST TECHNIQUE: Multiplanar, multiecho pulse sequences of the brain and surrounding structures were obtained without intravenous  contrast. Angiographic images of the head were obtained using MRA technique without contrast. COMPARISON:  CT head 02/21/2016 FINDINGS: MRI HEAD FINDINGS Small focus of acute infarct in the high left medial parietal lobe. No other area of acute infarct. Chronic infarct over the left frontal convexity. Moderate chronic microvascular ischemic changes throughout the white matter. Mild chronic ischemia in the basal ganglia. Cerebellum and brainstem show no evidence of infarction Chronic micro hemorrhage right caudate. No other areas of hemorrhage. Negative for mass or edema. Generalized atrophy.  No hydrocephalus.  Pituitary normal in size. Moderate mucosal edema paranasal sinuses. Mastoid sinus clear. No skull lesion. MRA HEAD FINDINGS Both vertebral arteries patent to the basilar. Left vertebral dominant. Left PICA patent. Right PICA not visualized. Basilar artery patent. Superior cerebellar and posterior cerebral arteries patent without stenosis. Internal carotid artery patent bilaterally without stenosis. Anterior and middle cerebral arteries patent without stenosis. Negative for cerebral aneurysm. IMPRESSION: Small area of acute infarct in the right medial parietal lobe measuring 5 mm. No other area of acute infarct Chronic ischemic changes throughout the white matter. Chronic cortical infarct over the left frontal convexity. Chronic micro hemorrhage right caudate. Electronically Signed   By: Franchot Gallo M.D.   On: 02/21/2016 11:28       Assessment and plan discussed with with attending physician and they are in agreement.    Etta Quill PA-C [facility] [phone]  02/21/2016, 11:42 AM   Assessment: 70 y.o. male presenting with acute stroke in the right parietal lobe. MRI, MRA head obtained. No significant stenosis intracranially. Due to CEA in past would get a CTA neck instead of doppler. Will need PT, OT, Echo, A1c and LDL.  Currently on ASA and likely will need to be changed to  Plavix   Stroke Risk Factors - carotid stenosis, diabetes mellitus and hypertension

## 2016-02-21 NOTE — Progress Notes (Signed)
Pt arrived to unit alert and oriented x 4. Patient states he has no pain. Patient has steady gait, ambulation well tolerated. Patient oriented to unit, orders and medications reviewed and confirmed by patient and family. Son and daughter-in-law, Wilhemena Durie, at bedside. Q 2 hr vitals and neuro checks initiated. Contact precautions initiated per protocol pending PCR results. Pt states he is comfortable in recliner, drinking prune juice, call light and telephone within patients reach. RN will continue to monitor.

## 2016-02-21 NOTE — Progress Notes (Signed)
Patient refused CPAP for the night. Will continue to monitor patients status.

## 2016-02-21 NOTE — ED Notes (Signed)
Pt. In MRI at time of 1000 neuro check. RN will perform neuro check upon pt. Return.

## 2016-02-22 ENCOUNTER — Encounter (HOSPITAL_COMMUNITY): Payer: Self-pay

## 2016-02-22 ENCOUNTER — Observation Stay (HOSPITAL_COMMUNITY): Payer: Commercial Managed Care - HMO

## 2016-02-22 DIAGNOSIS — I10 Essential (primary) hypertension: Secondary | ICD-10-CM | POA: Diagnosis present

## 2016-02-22 DIAGNOSIS — M109 Gout, unspecified: Secondary | ICD-10-CM | POA: Diagnosis present

## 2016-02-22 DIAGNOSIS — W228XXA Striking against or struck by other objects, initial encounter: Secondary | ICD-10-CM | POA: Diagnosis not present

## 2016-02-22 DIAGNOSIS — G459 Transient cerebral ischemic attack, unspecified: Secondary | ICD-10-CM

## 2016-02-22 DIAGNOSIS — E1142 Type 2 diabetes mellitus with diabetic polyneuropathy: Secondary | ICD-10-CM | POA: Diagnosis present

## 2016-02-22 DIAGNOSIS — G8324 Monoplegia of upper limb affecting left nondominant side: Secondary | ICD-10-CM | POA: Diagnosis present

## 2016-02-22 DIAGNOSIS — T1490 Injury, unspecified: Secondary | ICD-10-CM | POA: Diagnosis not present

## 2016-02-22 DIAGNOSIS — E669 Obesity, unspecified: Secondary | ICD-10-CM | POA: Diagnosis present

## 2016-02-22 DIAGNOSIS — Z888 Allergy status to other drugs, medicaments and biological substances status: Secondary | ICD-10-CM | POA: Diagnosis not present

## 2016-02-22 DIAGNOSIS — Q230 Congenital stenosis of aortic valve: Secondary | ICD-10-CM | POA: Diagnosis not present

## 2016-02-22 DIAGNOSIS — E785 Hyperlipidemia, unspecified: Secondary | ICD-10-CM | POA: Diagnosis present

## 2016-02-22 DIAGNOSIS — E1140 Type 2 diabetes mellitus with diabetic neuropathy, unspecified: Secondary | ICD-10-CM

## 2016-02-22 DIAGNOSIS — Z87891 Personal history of nicotine dependence: Secondary | ICD-10-CM

## 2016-02-22 DIAGNOSIS — Z6839 Body mass index (BMI) 39.0-39.9, adult: Secondary | ICD-10-CM | POA: Diagnosis not present

## 2016-02-22 DIAGNOSIS — I6529 Occlusion and stenosis of unspecified carotid artery: Secondary | ICD-10-CM | POA: Diagnosis present

## 2016-02-22 DIAGNOSIS — Z8673 Personal history of transient ischemic attack (TIA), and cerebral infarction without residual deficits: Secondary | ICD-10-CM | POA: Diagnosis present

## 2016-02-22 DIAGNOSIS — I639 Cerebral infarction, unspecified: Secondary | ICD-10-CM | POA: Diagnosis present

## 2016-02-22 DIAGNOSIS — E1151 Type 2 diabetes mellitus with diabetic peripheral angiopathy without gangrene: Secondary | ICD-10-CM | POA: Diagnosis present

## 2016-02-22 DIAGNOSIS — Z7982 Long term (current) use of aspirin: Secondary | ICD-10-CM | POA: Diagnosis not present

## 2016-02-22 DIAGNOSIS — Y92230 Patient room in hospital as the place of occurrence of the external cause: Secondary | ICD-10-CM | POA: Diagnosis not present

## 2016-02-22 DIAGNOSIS — Z9889 Other specified postprocedural states: Secondary | ICD-10-CM | POA: Diagnosis not present

## 2016-02-22 DIAGNOSIS — G4733 Obstructive sleep apnea (adult) (pediatric): Secondary | ICD-10-CM | POA: Diagnosis present

## 2016-02-22 DIAGNOSIS — Z794 Long term (current) use of insulin: Secondary | ICD-10-CM

## 2016-02-22 DIAGNOSIS — I6340 Cerebral infarction due to embolism of unspecified cerebral artery: Secondary | ICD-10-CM

## 2016-02-22 DIAGNOSIS — I2510 Atherosclerotic heart disease of native coronary artery without angina pectoris: Secondary | ICD-10-CM | POA: Diagnosis present

## 2016-02-22 DIAGNOSIS — J309 Allergic rhinitis, unspecified: Secondary | ICD-10-CM | POA: Diagnosis present

## 2016-02-22 DIAGNOSIS — F17200 Nicotine dependence, unspecified, uncomplicated: Secondary | ICD-10-CM

## 2016-02-22 DIAGNOSIS — E538 Deficiency of other specified B group vitamins: Secondary | ICD-10-CM | POA: Diagnosis present

## 2016-02-22 DIAGNOSIS — I340 Nonrheumatic mitral (valve) insufficiency: Secondary | ICD-10-CM | POA: Diagnosis not present

## 2016-02-22 LAB — HEMOGLOBIN A1C
HEMOGLOBIN A1C: 8.1 % — AB (ref 4.8–5.6)
Mean Plasma Glucose: 186 mg/dL

## 2016-02-22 LAB — MRSA PCR SCREENING: MRSA BY PCR: POSITIVE — AB

## 2016-02-22 LAB — GLUCOSE, CAPILLARY
GLUCOSE-CAPILLARY: 141 mg/dL — AB (ref 65–99)
Glucose-Capillary: 184 mg/dL — ABNORMAL HIGH (ref 65–99)
Glucose-Capillary: 189 mg/dL — ABNORMAL HIGH (ref 65–99)
Glucose-Capillary: 251 mg/dL — ABNORMAL HIGH (ref 65–99)

## 2016-02-22 LAB — ECHOCARDIOGRAM COMPLETE
HEIGHTINCHES: 67 in
WEIGHTICAEL: 3987.2 [oz_av]

## 2016-02-22 LAB — HEPATITIS C ANTIBODY (REFLEX): HCV Ab: 0.1 s/co ratio (ref 0.0–0.9)

## 2016-02-22 LAB — HCV COMMENT:

## 2016-02-22 MED ORDER — CLOPIDOGREL BISULFATE 75 MG PO TABS
75.0000 mg | ORAL_TABLET | Freq: Every day | ORAL | Status: DC
  Administered 2016-02-22 – 2016-02-24 (×3): 75 mg via ORAL
  Filled 2016-02-22 (×3): qty 1

## 2016-02-22 MED ORDER — CLOPIDOGREL BISULFATE 75 MG PO TABS: 75.0000 mg | ORAL_TABLET | Freq: Every day | ORAL | Status: DC

## 2016-02-22 NOTE — Care Management Note (Signed)
Case Management Note  Patient Details  Name: Laurenz Derderian MRN: [id number] Date of Birth: 11-05-1946  Subjective/Objective:                    Action/Plan: Patient was admitted with CVA. Will follow for discharge needs pending PT/OT evals and physician orders.  Expected Discharge Date:                  Expected Discharge Plan:     In-House Referral:     Discharge planning Services     Post Acute Care Choice:    Choice offered to:     DME Arranged:    DME Agency:     HH Arranged:    HH Agency:     Status of Service:  In process, will continue to follow  Medicare Important Message Given:    Date Medicare IM Given:    Medicare IM give by:    Date Additional Medicare IM Given:    Additional Medicare Important Message give by:     If discussed at [place] of Stay Meetings, dates discussed:    Additional Comments:  Rolm Baptise, RN 02/22/2016, 9:18 AM [phone]

## 2016-02-22 NOTE — Progress Notes (Signed)
STROKE TEAM PROGRESS NOTE   HISTORY OF PRESENT ILLNESS Sean Hayden is a 70 y.o. male who reports being in his normal state of health until 12:45 AM last night when he began having left arm numbness, tingling, and weakness while watching a movie. He thought he was having a heart attack and called EMS. He took two baby aspirin. He also had left mouth tingling that lasted only 5-10 seconds with associated dysarthria. His left arm symptoms resolved after about 10-15 minutes. He had right endarterectomy about 3 years ago due to blurry vision. CTA neck on 06/06/15 revealed interval right endarterectomy without residual stenosis, mild left carotid bifurcation plaques, and moderate right vertebral artery stenosis. He has been taking aspirin 81 mg BID for the past year and before that was taking it daily. He is compliant with taking anti-hypertensive, statin, and diabetic medications. On admission to the ED he was hypertensive to 167/75. CT head on admission revealed no acute finding but did show chronic small vessel disease and remote left MCA/watershed territory infarct. He was not given tPA due to resolution of symptoms.  Date last known well: 3.14.2017 Time last known well: Time: 24:45 tPA Given: No: resolution of symptoms     SUBJECTIVE (INTERVAL HISTORY) [name]. The patient states that his deficits lasted less than 10 minutes. There was no associated chest pain. Symptoms resolved so far. However, he had left sided symptoms and his acute stroke on MRI is at right medial parietal lobe and his old stroke was at left frontal convexity. Therefore, he needs embolic work up.    OBJECTIVE Temp:  [97.7 F (36.5 C)-98.2 F (36.8 C)] 97.7 F (36.5 C) (03/16 1029) Pulse Rate:  [70-84] 79 (03/16 1029) Cardiac Rhythm:  [-] Normal sinus rhythm (03/16 0823) Resp:  [16-20] 20 (03/16 1029) BP: (108-139)/(57-80) 139/80 mmHg (03/16 1029) SpO2:  [94 %-98 %] 97 % (03/16 1029) Weight:  [113.036 kg  (249 lb 3.2 oz)] 113.036 kg (249 lb 3.2 oz) (03/15 2320)  CBC:   Recent Labs Lab 02/21/16 0304 02/21/16 0315  WBC 7.1  --   NEUTROABS 4.6  --   HGB 15.4 17.0  HCT 45.7 50.0  MCV 90.9  --   PLT 195  --     Basic Metabolic Panel:   Recent Labs Lab 02/21/16 0304 02/21/16 0315 02/21/16 0828  NA 138 137  --   K 4.5 4.3  --   CL 101 99*  --   CO2 25  --   --   GLUCOSE 159* 153*  --   BUN 13 16  --   CREATININE 0.83 0.70  --   CALCIUM 9.7  --   --   MG  --   --  1.7    Lipid Panel:     Component Value Date/Time   CHOL 113 02/21/2016 0828   TRIG 209* 02/21/2016 0828   HDL 34* 02/21/2016 0828   CHOLHDL 3.3 02/21/2016 0828   VLDL 42* 02/21/2016 0828   LDLCALC 37 02/21/2016 0828   HgbA1c:  Lab Results  Component Value Date   HGBA1C 8.1* 02/21/2016   Urine Drug Screen:     Component Value Date/Time   LABOPIA NONE DETECTED 02/21/2016 1900   COCAINSCRNUR NONE DETECTED 02/21/2016 1900   LABBENZ NONE DETECTED 02/21/2016 1900   AMPHETMU NONE DETECTED 02/21/2016 1900   THCU NONE DETECTED 02/21/2016 1900   LABBARB NONE DETECTED 02/21/2016 1900      IMAGING I have personally reviewed  the radiological images below and agree with the radiology interpretations.  Dg Tibia/fibula Left 02/21/2016   5 mm long linear metallic foreign body posterior and medial to the proximal tibia. Evidence of old trauma involving the proximal to mid tibial diaphysis with benign-appearing periosteal reaction this area. No acute fracture or dislocation. Moderate osteoarthritic change noted in visualized left knee joint.  Ct Head Wo Contrast 02/21/2016   1. No acute finding.  2. Chronic small vessel disease and remote left MCA/watershed territory infarct.   Ct Angio Neck W/cm &/or Wo/cm 02/21/2016   Previous carotid endarterectomy on the right. No stenosis in that region. Soft plaque affecting the ICA distal to the bulb with narrowing to a diameter of 3 mm indicating 40% stenosis.  Atherosclerotic disease at the carotid bifurcation on the left. No stenosis at the bifurcation or within the ICA bulb. Soft plaque of the ICA distal to the bulb with narrowing to a diameter of 3 mm indicating 40% stenosis. High-grade stenosis of the nondominant right vertebral artery origin, 70% or greater. Dominant left vertebral artery widely patent.   Mr Sean Hayden Head/brain Wo Cm 02/21/2016   Small area of acute infarct in the left medial parietal lobe measuring 5 mm. No other area of acute infarct Chronic ischemic changes throughout the white matter. Chronic cortical infarct over the left frontal convexity. Chronic micro hemorrhage right caudate.  TTE -  Pending  LE venous doppler - pending   PHYSICAL EXAM  Temp:  [97.7 F (36.5 C)-98.2 F (36.8 C)] 98.1 F (36.7 C) (03/16 1909) Pulse Rate:  [70-79] 72 (03/16 1909) Resp:  [16-20] 20 (03/16 1909) BP: (108-139)/(57-80) 116/66 mmHg (03/16 1909) SpO2:  [96 %-98 %] 97 % (03/16 1909) Weight:  [249 lb 3.2 oz (113.036 kg)] 249 lb 3.2 oz (113.036 kg) (03/15 2320)  General - Well nourished, well developed, in no apparent distress.  Ophthalmologic - Sharp disc margins OU.   Cardiovascular - Regular rate and rhythm with no murmur.  Mental Status -  Level of arousal and orientation to time, place, and person were intact. Language including expression, naming, repetition, comprehension was assessed and found intact. Fund of Knowledge was assessed and was intact.  Cranial Nerves II - XII - II - Visual field intact OU. III, IV, VI - Extraocular movements intact. V - Facial sensation intact bilaterally. VII - Facial movement intact bilaterally. VIII - Hearing & vestibular intact bilaterally. X - Palate elevates symmetrically. XI - Chin turning & shoulder shrug intact bilaterally. XII - Tongue protrusion intact.  Motor Strength - The patient's strength was normal in all extremities and pronator drift was absent.  Bulk was normal and  fasciculations were absent.   Motor Tone - Muscle tone was assessed at the neck and appendages and was normal.  Reflexes - The patient's reflexes were 1+ in all extremities and he had no pathological reflexes.  Sensory - Light touch, temperature/pinprick, vibration and proprioception, and Romberg testing were assessed and were symmetrical.    Coordination - The patient had normal movements in the hands and feet with no ataxia or dysmetria.  Tremor was absent.  Gait and Station - The patient's transfers, posture, gait, station, and turns were observed as normal.    ASSESSMENT/PLAN Mr. Sean Hayden is a 70 y.o. male with history of diabetes mellitus, hypertension, MRSA infection, neuropathy, previous right carotid endarterectomy, presenting with transient left arm weakness and numbness. He did not receive IV t-PA due to resolution of deficits.  Stroke:  Left  medial parietal punctate infarct and old  possibly embolic from an unknown source. Pt had symptom on the left arm and mouth, which not related to current acute infarct. Due to bilateral involvement, he needs further embolic work up.   Resultant resolution of deficits  MRI  small area of acute infarct in the left medial parietal lobe.  MRA head Unremarkable  CTA of the neck - left ICA proximal and b/l siphon athero but no significant stenosis. nondominant right VA origin, 70% or greater.   LE venous Dopplers - pending  2D Echo - pending  Recommend TEE and possible loop recorder to rule out cardiac source of emboli  LDL - 37  HgbA1c 8.1 VTE prophylaxis - Lovenox  Diet Carb Modified Fluid consistency:: Thin; Room service appropriate?: Yes Diet NPO time specified  aspirin 81 mg daily prior to admission, now on clopidogrel 75 mg daily. Continue plavix on discharge.   Patient counseled to be compliant with his antithrombotic medications  Ongoing aggressive stroke risk factor management  Therapy recommendations:  CIR  Disposition:  Pending  Hypertension  Stable  Permissive hypertension (OK if < 220/120) but gradually normalize in 5-7 days  Hyperlipidemia  Home meds:  Lipitor 40 mg daily resumed in hospital  LDL 37, goal < 70  Continue statin at discharge  Diabetes  HgbA1c 8.1, goal < 7.0  Uncontrolled  On levemir  On SSI  Tobacco abuse  Current smoker  Smoking cessation counseling provided  Pt is willing to quit  Other Stroke Risk Factors  Advanced age  Obesity, Body mass index is 39.02 kg/(m^2).   Hx stroke - by MRI  Other [facility] day # 1  Rosalin Hawking, MD PhD Stroke Neurology 02/22/2016 7:38 PM     To contact Stroke Continuity provider, please refer to [email or web address] After hours, contact General Neurology

## 2016-02-22 NOTE — Progress Notes (Signed)
Pharmacy student rounding with IMTS-B2 Service, consulting on Lovenox dosing in this 70 year old man who was admitted 21-Feb-2016 following TIA/stroke. Patient is currently receiving DVT prophylaxis with enoxaparin (Lovenox) 40 mg subcutaneously Q24H.  Have evaluated subjective and objective information, including most recent labs of: Hb: 17.0 Hct: 50.0 Plt: 195  He weighs 249.2 lb (113.036 kg). Height = 67 inches. BMI = 39.1 kg/m2. CrCl = 103.2 mL/min.  Per [facility] protocol, patient should receive adjusted DVT prophylaxis dose of 0.5mg /kg SQ Q24H since BMI > 30 kg/m2 and CrCl > 30 mL/min.  Recommend adjusted Lovenox dose of 55 mg SQ Q24H to commence tomorrow (02/23/2016) as patient has already received 40 mg dose for today (02/22/2016). Will discuss with Dr. Benjamine Mola in morning.    Thank you for allowing [place] to participate in this patient's care.   Kateri Plummer, 2017 PharmD Candidate  I have reviewed the pharmacy student's note above and agree with the assessment and plan.  Lorain Keast L. Nicole Kindred, PharmD PGY2 Infectious Diseases Pharmacy Resident Pager: [phone] 02/23/2016 9:53 AM

## 2016-02-22 NOTE — Evaluation (Addendum)
Physical Therapy Evaluation/ Discharge Patient Details Name: Sean Hayden MRN: [id number] DOB: May 21, 1946 Today's Date: 02/22/2016   History of Present Illness  Pt admitted with LUE weaknesswith negative MRI and resolution of symptoms. PMHx: HTN, DM, gout, neuropathy, R CEA  Clinical Impression  Pt very pleasant and moving well. Pt with bil UE and LE 5/5 strength with equal sensation. Currently no deficits from baseline and reports resolution of all symptoms. Pt educated for CVA risk factors as well as signs and symptoms using BE FAST with pt and son educated. Pt able to perform all transfers and gait without assist, currently at baseline mobility and no further therapy needs at this time with pt and family aware and agreeable.     Follow Up Recommendations No PT follow up    Equipment Recommendations  None recommended by PT    Recommendations for Other Services       Precautions / Restrictions Precautions Precautions: None      Mobility  Bed Mobility               General bed mobility comments: pt in chair on arrival  Transfers Overall transfer level: Modified independent                  Ambulation/Gait Ambulation/Gait assistance: Independent Ambulation Distance (Feet): 350 Feet Assistive device: None Gait Pattern/deviations: WFL(Within Functional Limits)   Gait velocity interpretation: at or above normal speed for age/gender    Stairs            Wheelchair Mobility    Modified Rankin (Stroke Patients Only)       Balance Overall balance assessment: No apparent balance deficits (not formally assessed)                                           Pertinent Vitals/Pain Pain Assessment: No/denies pain    Home Living Family/patient expects to be discharged to:: Private residence Living Arrangements: Spouse/significant other Available Help at Discharge: Family;Available 24 hours/day Type of Home: House Home Access: Stairs to  enter   [employer] of Steps: 2 Home Layout: One level Home Equipment: None      Prior Function Level of Independence: Independent               Hand Dominance        Extremity/Trunk Assessment   Upper Extremity Assessment: Overall WFL for tasks assessed           Lower Extremity Assessment: Overall WFL for tasks assessed      Cervical / Trunk Assessment: Other exceptions  Communication   Communication: No difficulties  Cognition Arousal/Alertness: Awake/alert Behavior During Therapy: WFL for tasks assessed/performed Overall Cognitive Status: Within Functional Limits for tasks assessed                      General Comments      Exercises        Assessment/Plan    PT Assessment Patent does not need any further PT services  PT Diagnosis Other (comment) (asked to see with admission for possible CVA)   PT Problem List    PT Treatment Interventions     PT Goals (Current goals can be found in the Care Plan section) Acute Rehab PT Goals PT Goal Formulation: All assessment and education complete, DC therapy    Frequency     Barriers  to discharge        Co-evaluation               End of Session   Activity Tolerance: Patient tolerated treatment well Patient left: in chair;with call bell/phone within reach;with family/visitor present Nurse Communication: Mobility status    Functional Assessment Tool Used: clinical judgement Functional Limitation: Mobility: Walking and moving around Mobility: Walking and Moving Around Current Status [id number]): 0 percent impaired, limited or restricted Mobility: Walking and Moving Around Goal Status [phone]): 0 percent impaired, limited or restricted Mobility: Walking and Moving Around Discharge Status [phone]): 0 percent impaired, limited or restricted    Time: [id number] PT Time Calculation (min) (ACUTE ONLY): 16 min   Charges:   PT Evaluation $PT Eval Low Complexity: 1 Procedure      PT G Codes:   PT G-Codes **NOT FOR INPATIENT CLASS** Functional Assessment Tool Used: clinical judgement Functional Limitation: Mobility: Walking and moving around Mobility: Walking and Moving Around Current Status [id number]): 0 percent impaired, limited or restricted Mobility: Walking and Moving Around Goal Status [id number]): 0 percent impaired, limited or restricted Mobility: Walking and Moving Around Discharge Status [phone]): 0 percent impaired, limited or restricted    Melford Aase 02/22/2016, 10:18 AM  Elwyn Reach, [place]

## 2016-02-22 NOTE — Progress Notes (Signed)
    [facility] has been requested to perform a transesophageal echocardiogram to r/o embolic source of stroke.  After careful review of history and examination, the risks and benefits of transesophageal echocardiogram have been explained including risks of esophageal damage, perforation (1:10,000 risk), bleeding, pharyngeal hematoma as well as other potential complications associated with conscious sedation including aspiration, arrhythmia, respiratory failure and death. Alternatives to treatment were discussed, questions were answered. Patient is willing to proceed.  TEE - Dr. Debara Pickett @ 1500 . NPO after midnight. Meds with sips.   Leanor Kail, PA-C 02/22/2016 4:46 PM

## 2016-02-22 NOTE — Progress Notes (Signed)
  Echocardiogram 2D Echocardiogram has been performed.  Diamond Nickel 02/22/2016, 3:14 PM

## 2016-02-22 NOTE — Progress Notes (Signed)
Subjective: Symptoms remained fully improved overnight. Patient walked without difficulty for PT assessment. Feeling well.  Objective: Vital signs in last 24 hours: Filed Vitals:   02/22/16 0400 02/22/16 0546 02/22/16 0733 02/22/16 1029  BP: 108/57 130/67 130/67 139/80  Pulse: 71 72 70 79  Temp: 98.2 F (36.8 C) 98.2 F (36.8 C) 98.2 F (36.8 C) 97.7 F (36.5 C)  TempSrc: Oral Oral Oral Oral  Resp: 16 16 16 20   Height:      Weight:      SpO2: 97% 97% 97% 97%   Weight change:   Intake/Output Summary (Last 24 hours) at 02/22/16 1152 Last data filed at 02/22/16 0851  Gross per 24 hour  Intake    540 ml  Output      0 ml  Net    540 ml   GENERAL- obese elderly man, co-operative, NAD CARDIAC- RRR, no murmurs, rubs or gallops. RESP- CTAB, no wheezes or crackles. NEURO- 5/5 upper extremity strength, ambulatory without assistance EXTREMITIES- symmetric, no pedal edema. SKIN- Warm, dry, No rash or lesion. PSYCH- Normal mood and affect, appropriate thought content and speech.  Lab Results: CBG:  Recent Labs Lab 02/21/16 0744 02/21/16 1225 02/21/16 1720 02/22/16 0638  GLUCAP 143* 168* 165* 141*    Urine Drug Screen: Drugs of Abuse     Component Value Date/Time   LABOPIA NONE DETECTED 02/21/2016 1900   COCAINSCRNUR NONE DETECTED 02/21/2016 1900   LABBENZ NONE DETECTED 02/21/2016 1900   AMPHETMU NONE DETECTED 02/21/2016 1900   THCU NONE DETECTED 02/21/2016 1900   LABBARB NONE DETECTED 02/21/2016 1900    Micro Results: Recent Results (from the past 240 hour(s))  MRSA PCR Screening     Status: Abnormal   Collection Time: 02/21/16 11:39 PM  Result Value Ref Range Status   MRSA by PCR POSITIVE (A) NEGATIVE Final    Comment:        The GeneXpert MRSA Assay (FDA approved for NASAL specimens only), is one component of a comprehensive MRSA colonization surveillance program. It is not intended to diagnose MRSA infection nor to guide or monitor treatment  for MRSA infections. RESULT CALLED TO, READ BACK BY AND VERIFIED WITH: P SWEGER,RN @0328  02/22/16 MKELLY    Studies/Results: Dg Tibia/fibula Left  02/21/2016  CLINICAL DATA:  Concern for metallic foreign body in left lower extremity EXAM: LEFT TIBIA AND FIBULA - 2 VIEW COMPARISON:  None. FINDINGS: Frontal and lateral views were obtained. There is a linear metallic foreign body posterior and medial to the proximal tibial diaphysis consistent with a small wire fragment. This fragment measures 5 mm in length. No other radiopaque foreign body evident. There is evidence of old trauma involving the proximal tibial diaphysis with benign-appearing periosteal reaction in this area. No acute fracture or dislocation. No blastic or lytic bone lesion. There is degenerative change in the visualized left knee joint. IMPRESSION: 5 mm long linear metallic foreign body posterior and medial to the proximal tibia. Evidence of old trauma involving the proximal to mid tibial diaphysis with benign-appearing periosteal reaction this area. No acute fracture or dislocation. Moderate osteoarthritic change noted in visualized left knee joint. Electronically Signed   By: Lowella Grip III M.D.   On: 02/21/2016 10:11   Ct Head Wo Contrast  02/21/2016  CLINICAL DATA:  Left arm numbness for 15 minutes. Symptoms have resolved. Initial encounter. EXAM: CT HEAD WITHOUT CONTRAST TECHNIQUE: Contiguous axial images were obtained from the base of the skull through the vertex without  intravenous contrast. COMPARISON:  None. FINDINGS: Skull and Sinuses:No acute osseous finding. Moderate patchy sinusitis. Visualized orbits: Left cataract resection.  No acute finding. Brain: No evidence of acute infarction, hemorrhage, hydrocephalus, or mass lesion/mass effect. There is cortical and subcortical gliosis in the high left cerebral hemisphere from remote infarct, at the upper border zone territory. There is extensive patchy small vessel ischemic  change in the cerebral white matter. Intracranial calcified atherosclerosis. No explanation for left arm numbness. A left M2 branch is notably high-density at the insular level but presumably atherosclerotic/chronic given the asymmetric symptomatology. IMPRESSION: 1. No acute finding. 2. Chronic small vessel disease and remote left MCA/watershed territory infarct. Electronically Signed   By: Monte Fantasia M.D.   On: 02/21/2016 04:35   Ct Angio Neck W/cm &/or Wo/cm  02/21/2016  CLINICAL DATA:  Previous right carotid endarterectomy.  Left stroke. EXAM: CT ANGIOGRAPHY NECK TECHNIQUE: Multidetector CT imaging of the neck was performed using the standard protocol during bolus administration of intravenous contrast. Multiplanar CT image reconstructions and MIPs were obtained to evaluate the vascular anatomy. Carotid stenosis measurements (when applicable) are obtained utilizing NASCET criteria, using the distal internal carotid diameter as the denominator. CONTRAST:  61mL OMNIPAQUE IOHEXOL 350 MG/ML SOLN COMPARISON:  MRI same day FINDINGS: Aortic arch: Ordinary mild atherosclerosis of the aortic arch but no aneurysm or dissection. Branching pattern of the brachiocephalic vessels from the arch is normal, without origin stenosis. Right carotid system: Common carotid artery widely patent to the bifurcation. Previous carotid endarterectomy with wide patency in the carotid bulb. There is some atherosclerotic plaque affecting the internal carotid artery just beyond the surgical level. The vessel is narrowed to a minimal diameter of 3 mm. Compared to a more distal cervical ICA diameter of 5 mm, this indicates 40% stenosis. The ICA is tortuous beneath the skullbase but widely patent. Left carotid system: Common carotid artery is widely patent to the bifurcation region. There is atherosclerotic disease at the carotid bifurcation but no stenosis in the ICA bulb region. Distal to the bulb, the ICA shows narrowing apparently due  to soft plaque with minimal diameter of 3 mm. Compared to a distal cervical ICA diameter of 5 mm, this indicates a 40% stenosis. The vessel is tortuous just beneath the skullbase. Vertebral arteries:The left vertebral artery is dominant. Left vertebral artery origin is widely patent. The vessel is widely patent through the cervical region and through the foramen magnum to the basilar. There is some atherosclerotic disease in the region of the foramen magnum but no stenosis. The right vertebral artery is non dominant. There is atherosclerotic disease at the origin with stenosis possibly 70% or greater. Beyond that, the vessel is patent to the basilar with mild atherosclerotic disease at the foramen magnum level. Skeleton: Ordinary spondylosis Other neck: No mass or lymphadenopathy.  6 lung apices are clear. IMPRESSION: Previous carotid endarterectomy on the right. No stenosis in that region. Soft plaque affecting the ICA distal to the bulb with narrowing to a diameter of 3 mm indicating 40% stenosis. Atherosclerotic disease at the carotid bifurcation on the left. No stenosis at the bifurcation or within the ICA bulb. Soft plaque of the ICA distal to the bulb with narrowing to a diameter of 3 mm indicating 40% stenosis. High-grade stenosis of the nondominant right vertebral artery origin, 70% or greater. Dominant left vertebral artery widely patent. Electronically Signed   By: Nelson Chimes M.D.   On: 02/21/2016 15:02   Mr Brain Wo Contrast  02/21/2016  ADDENDUM REPORT: 02/21/2016 12:38 ADDENDUM: Correction:  Acute infarct is in the LEFT medial parietal lobe. Electronically Signed   By: Franchot Gallo M.D.   On: 02/21/2016 12:38  02/21/2016  CLINICAL DATA:  Left arm weakness, resolved. Diabetes, hypertension, hyperlipidemia, former smoker EXAM: MRI HEAD WITHOUT CONTRAST MRA HEAD WITHOUT CONTRAST TECHNIQUE: Multiplanar, multiecho pulse sequences of the brain and surrounding structures were obtained without  intravenous contrast. Angiographic images of the head were obtained using MRA technique without contrast. COMPARISON:  CT head 02/21/2016 FINDINGS: MRI HEAD FINDINGS Small focus of acute infarct in the high left medial parietal lobe. No other area of acute infarct. Chronic infarct over the left frontal convexity. Moderate chronic microvascular ischemic changes throughout the white matter. Mild chronic ischemia in the basal ganglia. Cerebellum and brainstem show no evidence of infarction Chronic micro hemorrhage right caudate. No other areas of hemorrhage. Negative for mass or edema. Generalized atrophy.  No hydrocephalus.  Pituitary normal in size. Moderate mucosal edema paranasal sinuses. Mastoid sinus clear. No skull lesion. MRA HEAD FINDINGS Both vertebral arteries patent to the basilar. Left vertebral dominant. Left PICA patent. Right PICA not visualized. Basilar artery patent. Superior cerebellar and posterior cerebral arteries patent without stenosis. Internal carotid artery patent bilaterally without stenosis. Anterior and middle cerebral arteries patent without stenosis. Negative for cerebral aneurysm. IMPRESSION: Small area of acute infarct in the right medial parietal lobe measuring 5 mm. No other area of acute infarct Chronic ischemic changes throughout the white matter. Chronic cortical infarct over the left frontal convexity. Chronic micro hemorrhage right caudate. Electronically Signed: By: Franchot Gallo M.D. On: 02/21/2016 11:28   Mr Jodene Nam Head/brain Wo Cm  02/21/2016  ADDENDUM REPORT: 02/21/2016 12:38 ADDENDUM: Correction:  Acute infarct is in the LEFT medial parietal lobe. Electronically Signed   By: Franchot Gallo M.D.   On: 02/21/2016 12:38  02/21/2016  CLINICAL DATA:  Left arm weakness, resolved. Diabetes, hypertension, hyperlipidemia, former smoker EXAM: MRI HEAD WITHOUT CONTRAST MRA HEAD WITHOUT CONTRAST TECHNIQUE: Multiplanar, multiecho pulse sequences of the brain and surrounding  structures were obtained without intravenous contrast. Angiographic images of the head were obtained using MRA technique without contrast. COMPARISON:  CT head 02/21/2016 FINDINGS: MRI HEAD FINDINGS Small focus of acute infarct in the high left medial parietal lobe. No other area of acute infarct. Chronic infarct over the left frontal convexity. Moderate chronic microvascular ischemic changes throughout the white matter. Mild chronic ischemia in the basal ganglia. Cerebellum and brainstem show no evidence of infarction Chronic micro hemorrhage right caudate. No other areas of hemorrhage. Negative for mass or edema. Generalized atrophy.  No hydrocephalus.  Pituitary normal in size. Moderate mucosal edema paranasal sinuses. Mastoid sinus clear. No skull lesion. MRA HEAD FINDINGS Both vertebral arteries patent to the basilar. Left vertebral dominant. Left PICA patent. Right PICA not visualized. Basilar artery patent. Superior cerebellar and posterior cerebral arteries patent without stenosis. Internal carotid artery patent bilaterally without stenosis. Anterior and middle cerebral arteries patent without stenosis. Negative for cerebral aneurysm. IMPRESSION: Small area of acute infarct in the right medial parietal lobe measuring 5 mm. No other area of acute infarct Chronic ischemic changes throughout the white matter. Chronic cortical infarct over the left frontal convexity. Chronic micro hemorrhage right caudate. Electronically Signed: By: Franchot Gallo M.D. On: 02/21/2016 11:28   Medications: I have reviewed the patient's current medications. Scheduled Meds: . atorvastatin  40 mg Oral Daily  . clopidogrel  75 mg Oral Daily  . enoxaparin (LOVENOX) injection  40 mg Subcutaneous Q24H  . insulin aspart  0-15 Units Subcutaneous TID WC  . insulin detemir  50 Units Subcutaneous BID  . loratadine  10 mg Oral Daily   Continuous Infusions:  PRN Meds:.acetaminophen **OR** acetaminophen,  senna-docusate Assessment/Plan: TIA/stroke: Patient with fully resolved right sided extremity weakness. MRI head demonstrated small acute L parietal infarct. This would not cause a local left extremity deficit. Probably transient ischemia in a right sided distribution that is now resolved. Carotid arteries with no more than 40% stenosis on CTA. Risk factor modification would be unchanged to reduce chance of recurrent ischemic CVA. -TTE still pending for ruling out cardioembolic source -plavix 75mg  -atorvastatin to 40mg  (LDL 37, but secondary Rx for new ischemic CVA and tolerating 20mg  dose)  IDDM with peripheral neuropathy: HgbA1c 8.1%, worsening from 7.3% previously in 11/2015. Will plan to d/c on admit meds to be followed up with PCP.  Diet: Carb mod DVT ppx: [place] lovenox FULL CODE  Dispo: Disposition is deferred at this time, awaiting improvement of current medical problems.  Anticipated discharge to home later today.  The patient does not have a current PCP (No primary care provider on file.) and does need an [facility] hospital follow-up appointment after discharge.  The patient does not have transportation limitations that hinder transportation to clinic appointments.    Sean Salina, MD 02/22/2016, 11:52 AM

## 2016-02-22 NOTE — Progress Notes (Signed)
OT Cancellation Note  Patient Details Name: Sean Hayden MRN: [id number] DOB: [birth year]   Cancelled Treatment:    Reason Eval/Treat Not Completed: OT screened, no needs identified, will sign off. Per PT, pt currently at baseline level with no acute OT needs. Will sign off at this time. Please re-consult if needs change. Thank you for this referral.   Binnie Kand M.S., OTR/L Pager: [phone]  02/22/2016, 10:18 AM

## 2016-02-22 NOTE — Progress Notes (Signed)
Q 2 hr vital signs completed. Pt denies pain, states he is ready to get home. Patient educated on healthy lifestyle choices, diet modifications for heart healthy and DM control, stroke risk factors. Stroke booklet provided to patient and family. Patient stated multiple times since arriving to unit his memory and comprehension "isn't all that good". Education with family present would benefit patient, patient clearly stated he would not remember. Report given to oncoming RN.

## 2016-02-23 ENCOUNTER — Inpatient Hospital Stay (HOSPITAL_COMMUNITY): Payer: Commercial Managed Care - HMO

## 2016-02-23 ENCOUNTER — Encounter (HOSPITAL_COMMUNITY)
Admission: EM | Disposition: A | Discharge status: Home / Self Care | Payer: Self-pay | Source: Home / Self Care | Attending: Student in an Organized Health Care Education/Training Program

## 2016-02-23 ENCOUNTER — Ambulatory Visit: Payer: Commercial Managed Care - HMO | Admitting: Family Medicine

## 2016-02-23 ENCOUNTER — Encounter (HOSPITAL_COMMUNITY): Payer: Self-pay | Admitting: Internal Medicine

## 2016-02-23 DIAGNOSIS — Z9889 Other specified postprocedural states: Secondary | ICD-10-CM

## 2016-02-23 DIAGNOSIS — I639 Cerebral infarction, unspecified: Principal | ICD-10-CM

## 2016-02-23 DIAGNOSIS — Q230 Congenital stenosis of aortic valve: Secondary | ICD-10-CM | POA: Diagnosis present

## 2016-02-23 DIAGNOSIS — I340 Nonrheumatic mitral (valve) insufficiency: Secondary | ICD-10-CM

## 2016-02-23 HISTORY — PX: EP IMPLANTABLE DEVICE: SHX172B

## 2016-02-23 HISTORY — PX: TEE WITHOUT CARDIOVERSION: SHX5443

## 2016-02-23 LAB — GLUCOSE, CAPILLARY
GLUCOSE-CAPILLARY: 219 mg/dL — AB (ref 65–99)
Glucose-Capillary: 148 mg/dL — ABNORMAL HIGH (ref 65–99)
Glucose-Capillary: 160 mg/dL — ABNORMAL HIGH (ref 65–99)

## 2016-02-23 SURGERY — LOOP RECORDER INSERTION
Anesthesia: LOCAL

## 2016-02-23 SURGERY — ECHOCARDIOGRAM, TRANSESOPHAGEAL
Anesthesia: Moderate Sedation

## 2016-02-23 MED ORDER — LIDOCAINE VISCOUS 2 % MT SOLN
OROMUCOSAL | Status: DC | PRN
  Administered 2016-02-23: 15 mL via OROMUCOSAL

## 2016-02-23 MED ORDER — LIDOCAINE-EPINEPHRINE 1 %-1:100000 IJ SOLN
INTRAMUSCULAR | Status: DC | PRN
  Administered 2016-02-23: 5 mL

## 2016-02-23 MED ORDER — SODIUM CHLORIDE 0.9 % IV SOLN
INTRAVENOUS | Status: DC
  Administered 2016-02-23: 500 mL via INTRAVENOUS

## 2016-02-23 MED ORDER — LIDOCAINE VISCOUS 2 % MT SOLN
OROMUCOSAL | Status: AC
  Filled 2016-02-23: qty 15

## 2016-02-23 MED ORDER — MIDAZOLAM HCL 5 MG/ML IJ SOLN
INTRAMUSCULAR | Status: AC
  Filled 2016-02-23: qty 2

## 2016-02-23 MED ORDER — FENTANYL CITRATE (PF) 100 MCG/2ML IJ SOLN
INTRAMUSCULAR | Status: AC
  Filled 2016-02-23: qty 2

## 2016-02-23 MED ORDER — LIDOCAINE-EPINEPHRINE 1 %-1:100000 IJ SOLN
INTRAMUSCULAR | Status: AC
  Filled 2016-02-23: qty 1

## 2016-02-23 MED ORDER — ENOXAPARIN SODIUM 60 MG/0.6ML ~~LOC~~ SOLN
55.0000 mg | SUBCUTANEOUS | Status: DC
  Administered 2016-02-23 – 2016-02-24 (×2): 55 mg via SUBCUTANEOUS
  Filled 2016-02-23 (×2): qty 0.55
  Filled 2016-02-23: qty 1

## 2016-02-23 MED ORDER — MIDAZOLAM HCL 10 MG/2ML IJ SOLN
INTRAMUSCULAR | Status: DC | PRN
  Administered 2016-02-23: 2 mg via INTRAVENOUS
  Administered 2016-02-23: 1 mg via INTRAVENOUS

## 2016-02-23 MED ORDER — FENTANYL CITRATE (PF) 100 MCG/2ML IJ SOLN
INTRAMUSCULAR | Status: DC | PRN
  Administered 2016-02-23: 25 ug via INTRAVENOUS

## 2016-02-23 MED ORDER — BUTAMBEN-TETRACAINE-BENZOCAINE 2-2-14 % EX AERO
INHALATION_SPRAY | CUTANEOUS | Status: DC | PRN
  Administered 2016-02-23: 2 via TOPICAL

## 2016-02-23 SURGICAL SUPPLY — 2 items
LOOP REVEAL LINQSYS (Prosthesis & Implant Heart) ×2 IMPLANT
PACK LOOP INSERTION (CUSTOM PROCEDURE TRAY) ×2 IMPLANT

## 2016-02-23 NOTE — Progress Notes (Signed)
VASCULAR LAB PRELIMINARY  PRELIMINARY  PRELIMINARY  PRELIMINARY  Bilateral lower extremity venous duplex completed.     Bilateral:  No evidence of DVT, superficial thrombosis, or Baker's Cyst.   Janifer Adie, RVT, RDMS 02/23/2016, 1:34 PM

## 2016-02-23 NOTE — Progress Notes (Signed)
  Echocardiogram Echocardiogram Transesophageal has been performed.  Sean Hayden 02/23/2016, 3:48 PM

## 2016-02-23 NOTE — H&P (View-Only) (Signed)
ELECTROPHYSIOLOGY CONSULT NOTE  Patient ID: Sean Hayden MRN: [id number], DOB/AGE: [birth year]   Admit date: 02/21/2016 Date of Consult: 02/23/2016  Primary Physician: No primary care provider on file. Primary Cardiologist: None Reason for Consultation: Cryptogenic stroke; recommendations regarding Implantable Loop Recorder  History of Present Illness Sean Hayden was admitted on 02/21/2016 with an acute CVA.  They first developed symptoms while watching a movie.  He PMHx of PVD (hx of R CEA), HTN, DM.   Imaging demonstrated Left medial parietal punctate infarct and old possibly embolic from an unknown source.  he has undergone workup for stroke including echocardiogram and carotid angio.  The patient has been monitored on telemetry which has demonstrated sinus rhythm with no arrhythmias.  Inpatient stroke work-up is to be completed with a TEE this afternoon.   The patient feels well this morning, denies any kind of symptoms, and reports his LEU mouth numbness remains resolved.  Echocardiogram this admission demonstrated  Study Conclusions - Left ventricle: The cavity size was normal. Wall thickness was  increased in a pattern of mild LVH. Systolic function was normal.  The estimated ejection fraction was in the range of 60% to 65%.  Wall motion was normal; there were no regional wall motion  abnormalities. Doppler parameters are consistent with abnormal  left ventricular relaxation (grade 1 diastolic dysfunction). The  E/e&' ratio is between 8-15, suggesting indeterminate LV filling  pressure. - Aortic valve: Moderately calcified with restricted leaflet  motion. Moderate to severe stenosis. Mean gradient (S): 17 mm Hg.  Peak gradient (S): 28 mm Hg. Valve area (VTI): 0.93 cm^2. Valve  area (Vmax): 0.99 cm^2. Valve area (Vmean): 0.86 cm^2. - Left atrium: The atrium was mildly dilated at 38 ml/m2. - Right ventricle: The cavity size was normal. Wall thickness was  normal. The  moderator band was prominent. Systolic function was  normal. - Right atrium: Moderately dilated at 26 cm2. - Inferior vena cava: The vessel was normal in size. The  respirophasic diameter changes were in the normal range (>= 50%),  consistent with normal central venous pressure. Impressions: - LVEF 60-65%, mild LVH, normal wall motion, diastolic dysfunction,  indeterminate LV Filling pressure, moderate to severe aortic  stenosis - mean gradient of 17 mmHg, calculated AVA around 1 cm2.  Mild LAE, moderate RAE, normal IVC.    Lab work is reviewed.  Prior to admission, the patient denies chest pain, shortness of breath, dizziness, palpitations, or syncope.  They are recovering from their stroke with disposition pending  EP has been asked to evaluate for placement of an implantable loop recorder to monitor for atrial fibrillation.     Past Medical History  Diagnosis Date  . Diabetes mellitus without complication ([place])   . Hypertension   . Gout   . MRSA infection   . Neuropathy ([facility])   . History of right-sided carotid endarterectomy 04/17/12  . B12 deficiency   . OSA (obstructive sleep apnea)   . Diverticulosis   . Hemorrhoids   . Nephrolithiasis      Surgical History:  Past Surgical History  Procedure Laterality Date  . Carotid artery angioplasty Right 04/17/2012    Dr. Delana Meyer, [facility]  . Hemorrhoid surgery  2009  . Cholecystectomy  2005    lap     Prescriptions prior to admission  Medication Sig Dispense Refill Last Dose  . acetaminophen (TYLENOL) 650 MG CR tablet Take 1,300 mg by mouth 2 (two) times daily.   02/20/2016 at Unknown time  .  acyclovir (ZOVIRAX) 200 MG capsule Take 200 mg by mouth 5 (five) times daily as needed (for fever blisters).   02/20/2016 at Unknown time  . atorvastatin (LIPITOR) 40 MG tablet Take 20 mg by mouth daily.   02/20/2016 at Unknown time  . canagliflozin (INVOKANA) 300 MG TABS tablet Take 300 mg by mouth daily before breakfast.    02/20/2016 at Unknown time  . Cholecalciferol (VITAMIN D PO) Take 1 tablet by mouth daily.   02/20/2016 at Unknown time  . Cyanocobalamin (VITAMIN B 12 PO) Take 1 tablet by mouth 2 (two) times daily.   02/20/2016 at Unknown time  . Flaxseed, Linseed, (FLAXSEED OIL) 1000 MG CAPS Take 1,000 mg by mouth 2 (two) times daily.   02/20/2016 at Unknown time  . insulin detemir (LEVEMIR) 100 UNIT/ML injection Inject 65 Units into the skin 2 (two) times daily.   02/20/2016 at Unknown time  . lisinopril (PRINIVIL,ZESTRIL) 5 MG tablet Take 5 mg by mouth daily.   02/20/2016 at Unknown time  . loratadine (CLARITIN) 10 MG tablet Take 10 mg by mouth daily.   02/20/2016 at Unknown time  . metFORMIN (GLUCOPHAGE) 1000 MG tablet Take 1,000 mg by mouth 3 (three) times daily.   02/20/2016 at Unknown time  . Misc Natural Products (OSTEO BI-FLEX TRIPLE STRENGTH PO) Take 1 tablet by mouth 2 (two) times daily.   02/20/2016 at Unknown time  . Omega-3 Fatty Acids (FISH OIL) 1200 MG CAPS Take 2,400 mg by mouth daily.   02/20/2016 at Unknown time  . UNABLE TO FIND Take 2 capsules by mouth daily at 2 am. Med Name: prosvent 355   02/20/2016 at Unknown time    Inpatient Medications:  . atorvastatin  40 mg Oral Daily  . clopidogrel  75 mg Oral Daily  . enoxaparin (LOVENOX) injection  40 mg Subcutaneous Q24H  . insulin aspart  0-15 Units Subcutaneous TID WC  . insulin detemir  50 Units Subcutaneous BID  . loratadine  10 mg Oral Daily    Allergies:  Allergies  Allergen Reactions  . Actos [Pioglitazone]     Social History   Social History  . Marital Status: Married    Spouse Name: N/A  . Number of Children: N/A  . Years of Education: N/A   Occupational History  . Not on file.   Social History Main Topics  . Smoking status: Former Research scientist (life sciences)  . Smokeless tobacco: Not on file     Comment: cigarettes and cigars  . Alcohol Use: No     Comment: Former heavy alcohol user  . Drug Use: No  . Sexual Activity: Not on file   Other  Topics Concern  . Not on file   Social History Narrative     Family History  Problem Relation Age of Onset  . Hodgkin's lymphoma Father       Review of Systems: All other systems reviewed and are otherwise negative except as noted above.  Physical Exam: Filed Vitals:   02/22/16 2206 02/23/16 0149 02/23/16 0634 02/23/16 0855  BP: 124/68 128/76 133/66 130/78  Pulse: 78 73 71 72  Temp: 98.4 F (36.9 C) 98.6 F (37 C) 98.4 F (36.9 C) 97.7 F (36.5 C)  TempSrc: Oral Oral Oral Oral  Resp: 18 18  20   Height:      Weight:      SpO2: 94% 95% 93% 94%    GEN- The patient is well appearing, alert and oriented x 3 today.   Head- normocephalic,  atraumatic Eyes-  Sclera clear, conjunctiva pink Ears- hearing intact Oropharynx- clear Neck- supple Lungs- Clear to ausculation bilaterally, normal work of breathing Heart- Regular rate and rhythm, no murmurs, rubs or gallops  GI- soft, NT, ND, + BS Extremities- no clubbing, cyanosis, or edema MS- no significant deformity or atrophy Skin- no rash or lesion Psych- euthymic mood, full affect   Labs:   Lab Results  Component Value Date   WBC 7.1 02/21/2016   HGB 17.0 02/21/2016   HCT 50.0 02/21/2016   MCV 90.9 02/21/2016   PLT 195 02/21/2016    Recent Labs Lab 02/21/16 0304 02/21/16 0315  NA 138 137  K 4.5 4.3  CL 101 99*  CO2 25  --   BUN 13 16  CREATININE 0.83 0.70  CALCIUM 9.7  --   PROT 7.2  --   BILITOT 0.6  --   ALKPHOS 70  --   ALT 23  --   AST 23  --   GLUCOSE 159* 153*   No results found for: CKTOTAL, CKMB, CKMBINDEX, TROPONINI Lab Results  Component Value Date   CHOL 113 02/21/2016   Lab Results  Component Value Date   HDL 34* 02/21/2016   Lab Results  Component Value Date   LDLCALC 37 02/21/2016   Lab Results  Component Value Date   TRIG 209* 02/21/2016   Lab Results  Component Value Date   CHOLHDL 3.3 02/21/2016   No results found for: LDLDIRECT  No results found for: DDIMER     Radiology/Studies:  Ct Head Wo Contrast 02/21/2016  CLINICAL DATA:  Left arm numbness for 15 minutes. Symptoms have resolved. Initial encounter. EXAM: CT HEAD WITHOUT CONTRAST TECHNIQUE: Contiguous axial images were obtained from the base of the skull through the vertex without intravenous contrast. COMPARISON:  None. FINDINGS: Skull and Sinuses:No acute osseous finding. Moderate patchy sinusitis. Visualized orbits: Left cataract resection.  No acute finding. Brain: No evidence of acute infarction, hemorrhage, hydrocephalus, or mass lesion/mass effect. There is cortical and subcortical gliosis in the high left cerebral hemisphere from remote infarct, at the upper border zone territory. There is extensive patchy small vessel ischemic change in the cerebral white matter. Intracranial calcified atherosclerosis. No explanation for left arm numbness. A left M2 branch is notably high-density at the insular level but presumably atherosclerotic/chronic given the asymmetric symptomatology. IMPRESSION: 1. No acute finding. 2. Chronic small vessel disease and remote left MCA/watershed territory infarct. Electronically Signed   By: Monte Fantasia M.D.   On: 02/21/2016 04:35   Ct Angio Neck W/cm &/or Wo/cm 02/21/2016  CLINICAL DATA:  Previous right carotid endarterectomy.  Left stroke. EXAM: CT ANGIOGRAPHY NECK TECHNIQUE: Multidetector CT imaging of the neck was performed using the standard protocol during bolus administration of intravenous contrast. Multiplanar CT image reconstructions and MIPs were obtained to evaluate the vascular anatomy. Carotid stenosis measurements (when applicable) are obtained utilizing NASCET criteria, using the distal internal carotid diameter as the denominator. CONTRAST:  73mL OMNIPAQUE IOHEXOL 350 MG/ML SOLN COMPARISON:  MRI same day FINDINGS: Aortic arch: Ordinary mild atherosclerosis of the aortic arch but no aneurysm or dissection. Branching pattern of the brachiocephalic vessels from the  arch is normal, without origin stenosis. Right carotid system: Common carotid artery widely patent to the bifurcation. Previous carotid endarterectomy with wide patency in the carotid bulb. There is some atherosclerotic plaque affecting the internal carotid artery just beyond the surgical level. The vessel is narrowed to a minimal diameter of 3 mm. Compared  to a more distal cervical ICA diameter of 5 mm, this indicates 40% stenosis. The ICA is tortuous beneath the skullbase but widely patent. Left carotid system: Common carotid artery is widely patent to the bifurcation region. There is atherosclerotic disease at the carotid bifurcation but no stenosis in the ICA bulb region. Distal to the bulb, the ICA shows narrowing apparently due to soft plaque with minimal diameter of 3 mm. Compared to a distal cervical ICA diameter of 5 mm, this indicates a 40% stenosis. The vessel is tortuous just beneath the skullbase. Vertebral arteries:The left vertebral artery is dominant. Left vertebral artery origin is widely patent. The vessel is widely patent through the cervical region and through the foramen magnum to the basilar. There is some atherosclerotic disease in the region of the foramen magnum but no stenosis. The right vertebral artery is non dominant. There is atherosclerotic disease at the origin with stenosis possibly 70% or greater. Beyond that, the vessel is patent to the basilar with mild atherosclerotic disease at the foramen magnum level. Skeleton: Ordinary spondylosis Other neck: No mass or lymphadenopathy.  6 lung apices are clear. IMPRESSION: Previous carotid endarterectomy on the right. No stenosis in that region. Soft plaque affecting the ICA distal to the bulb with narrowing to a diameter of 3 mm indicating 40% stenosis. Atherosclerotic disease at the carotid bifurcation on the left. No stenosis at the bifurcation or within the ICA bulb. Soft plaque of the ICA distal to the bulb with narrowing to a diameter  of 3 mm indicating 40% stenosis. High-grade stenosis of the nondominant right vertebral artery origin, 70% or greater. Dominant left vertebral artery widely patent. Electronically Signed   By: Nelson Chimes M.D.   On: 02/21/2016 15:02   Mr Brain Wo Contrast 02/21/2016  ADDENDUM REPORT: 02/21/2016 12:38 ADDENDUM: Correction:  Acute infarct is in the LEFT medial parietal lobe. Electronically Signed   By: Franchot Gallo M.D.   On: 02/21/2016 12:38 02/21/2016  CLINICAL DATA:  Left arm weakness, resolved. Diabetes, hypertension, hyperlipidemia, former smoker EXAM: MRI HEAD WITHOUT CONTRAST MRA HEAD WITHOUT CONTRAST TECHNIQUE: Multiplanar, multiecho pulse sequences of the brain and surrounding structures were obtained without intravenous contrast. Angiographic images of the head were obtained using MRA technique without contrast. COMPARISON:  CT head 02/21/2016 FINDINGS: MRI HEAD FINDINGS Small focus of acute infarct in the high left medial parietal lobe. No other area of acute infarct. Chronic infarct over the left frontal convexity. Moderate chronic microvascular ischemic changes throughout the white matter. Mild chronic ischemia in the basal ganglia. Cerebellum and brainstem show no evidence of infarction Chronic micro hemorrhage right caudate. No other areas of hemorrhage. Negative for mass or edema. Generalized atrophy.  No hydrocephalus.  Pituitary normal in size. Moderate mucosal edema paranasal sinuses. Mastoid sinus clear. No skull lesion. MRA HEAD FINDINGS Both vertebral arteries patent to the basilar. Left vertebral dominant. Left PICA patent. Right PICA not visualized. Basilar artery patent. Superior cerebellar and posterior cerebral arteries patent without stenosis. Internal carotid artery patent bilaterally without stenosis. Anterior and middle cerebral arteries patent without stenosis. Negative for cerebral aneurysm. IMPRESSION: Small area of acute infarct in the right medial parietal lobe measuring 5 mm.  No other area of acute infarct Chronic ischemic changes throughout the white matter. Chronic cortical infarct over the left frontal convexity. Chronic micro hemorrhage right caudate. Electronically Signed: By: Franchot Gallo M.D. On: 02/21/2016 11:28     12-lead ECG SR All prior EKG's in EPIC reviewed with no documented  atrial fibrillation  Telemetry SR  Assessment and Plan:  1. Cryptogenic stroke The patient presents with cryptogenic stroke.  The patient has a TEE planned for this afternoon.  I spoke at length with the patient about monitoring for afib with either a 30 day event monitor or an implantable loop recorder.  Risks, benefits, and alteratives to implantable loop recorder were discussed with the patient today.   At this time, the patient is unagreeable to a 30 day monitor (feels it would be to cumbersome and difficult to manage) and very undecided and at least at this time, does not feel like he would want to proceed with ILR and would like to discuss with his son and wife, he follows out patient at the [place] for all his health care needs and thinks he would prefer to f/u there first, he also has concerns about cost associated with the monthly monitoring.    2. Incidentally, mod-severe AS is noted on his TTE     TEE is pending     No history of syncope or CHF     Renee Dyane Dustman, PA-C 02/23/2016  I have seen, examined the patient, and reviewed the above assessment and plan.  On exam, RRR.  TEE reviewed and normal. Changes to above are made where necessary.   Will proceed with ILR implant at this time  Co Sign: Thompson Grayer, MD 02/23/2016 4:09 PM

## 2016-02-23 NOTE — CV Procedure (Signed)
TRANSESOPHAGEAL ECHOCARDIOGRAM (TEE) NOTE  INDICATIONS: stroke, aortic stenosis  PROCEDURE:   Informed consent was obtained prior to the procedure. The risks, benefits and alternatives for the procedure were discussed and the patient comprehended these risks.  Risks include, but are not limited to, cough, sore throat, vomiting, nausea, somnolence, esophageal and stomach trauma or perforation, bleeding, low blood pressure, aspiration, pneumonia, infection, trauma to the teeth and death.    After a procedural time-out, the patient was given 3 mg versed and 50 mcg fentanyl for moderate sedation.  The patient's heart rate, blood pressure, and oxygen saturation are monitored continuously during the procedure.The oropharynx was anesthetized 10 cc of topical 1% viscous lidocaine and 2 cetacaine sprays.  The transesophageal probe was inserted in the esophagus and stomach without difficulty and multiple views were obtained.  The patient was kept under observation until the patient left the procedure room.  The period of conscious sedation is 16 minutes, of which I was present face-to-face 100% of this time. The patient left the procedure room in stable condition.   Agitated microbubble saline contrast was administered.  COMPLICATIONS:    There were no immediate complications.  Findings:  1. LEFT VENTRICLE: The left ventricular wall thickness is mildly increased.  The left ventricular cavity is normal in size. Wall motion is normal.  LVEF is 60-65%.  2. RIGHT VENTRICLE:  The right ventricle is normal in structure and function without any thrombus or masses.    3. LEFT ATRIUM:  The left atrium is dilated in size without any thrombus or masses.  There is not spontaneous echo contrast ("smoke") in the left atrium consistent with a low flow state.  4. LEFT ATRIAL APPENDAGE:  The left atrial appendage is free of any thrombus or masses. The appendage has single lobes and windsock shape. Pulse doppler  indicates moderate flow in the appendage.  5. ATRIAL SEPTUM:  The atrial septum appears intact and is free of thrombus and/or masses.  There is no evidence for interatrial shunting by color doppler and saline microbubble.  6. RIGHT ATRIUM:  The right atrium is normal in size and function without any thrombus or masses.  7. MITRAL VALVE:  The mitral valve is normal in structure and function with Mild regurgitation.  There were no vegetations or stenosis.  8. AORTIC VALVE:  The aortic valve is trileaflet, however, the basal 1/3 of each leaflet is fused with sclerosis, but little calcification. This is consistent with congenital aortic stenosis. The AVA by planimetry is 1.32 cm2, suggesting moderate aortic stenosis (and correlating with his TTE values). There is Mild central regurgitation.  There were no vegetations.  9. TRICUSPID VALVE:  The tricuspid valve is normal in structure and function with trivial regurgitation.  There were no vegetations or stenosis  10.  PULMONIC VALVE:  The pulmonic valve is normal in structure and function with no regurgitation.  There were no vegetations or stenosis.   11. AORTIC ARCH, ASCENDING AND DESCENDING AORTA:  There was grade 1 Ron Parker et. Al, 1992) atherosclerosis of the ascending aorta, aortic arch, or proximal descending aorta.  12. PULMONARY VEINS: Anomalous pulmonary venous return was not noted.  13. PERICARDIUM: The pericardium appeared normal and non-thickened.  There is no pericardial effusion.  IMPRESSION:   1. No LAA thrombus 2. Negative for PFO 3. Moderate congenital AS with AVA of 1.32 cm2 by planimetry 4. Mild MR, AI 5. LVEF 60-65% with normal wall motion  RECOMMENDATIONS:    1. Agree with  loop recorder implantation to look for further causes of stroke. 2. Would repeat TTE in 1 year to assure aortic stenosis is not worsening, but I suspect this has been present for most of his life.  Time Spent Directly with the Patient:  30 minutes    Pixie Casino, MD, [facility] Attending Cardiologist [facility]  02/23/2016, 3:32 PM

## 2016-02-23 NOTE — Progress Notes (Signed)
Subjective: Patient feeling well this morning. Remained in hospital for further workup of cardioembolic source for stroke. He is NPO since midnight in anticipation of TEE this afternoon.  Objective: Vital signs in last 24 hours: Filed Vitals:   02/22/16 2206 02/23/16 0149 02/23/16 0634 02/23/16 0855  BP: 124/68 128/76 133/66 130/78  Pulse: 78 73 71 72  Temp: 98.4 F (36.9 C) 98.6 F (37 C) 98.4 F (36.9 C) 97.7 F (36.5 C)  TempSrc: Oral Oral Oral Oral  Resp: 18 18  20   Height:      Weight:      SpO2: 94% 95% 93% 94%   Weight change:  No intake or output data in the 24 hours ending 02/23/16 0912   GENERAL- obese elderly man, co-operative, NAD CARDIAC- RRR, no murmurs, rubs or gallops. RESP- CTAB, no wheezes or crackles. NEURO- 5/5 upper extremity strength, ambulatory without assistance EXTREMITIES- symmetric, trace pedal edema. SKIN- Warm, dry, No rash or lesion. PSYCH- Normal mood and affect, appropriate thought content and speech.  Lab Results: CBG:  Recent Labs Lab 02/21/16 1720 02/22/16 0638 02/22/16 1158 02/22/16 1647 02/22/16 2205 02/23/16 0637  GLUCAP 165* 141* 184* 189* 251* 148*    Urine Drug Screen: Drugs of Abuse     Component Value Date/Time   LABOPIA NONE DETECTED 02/21/2016 1900   COCAINSCRNUR NONE DETECTED 02/21/2016 1900   LABBENZ NONE DETECTED 02/21/2016 1900   AMPHETMU NONE DETECTED 02/21/2016 1900   THCU NONE DETECTED 02/21/2016 1900   LABBARB NONE DETECTED 02/21/2016 1900    Micro Results: Recent Results (from the past 240 hour(s))  MRSA PCR Screening     Status: Abnormal   Collection Time: 02/21/16 11:39 PM  Result Value Ref Range Status   MRSA by PCR POSITIVE (A) NEGATIVE Final    Comment:        The GeneXpert MRSA Assay (FDA approved for NASAL specimens only), is one component of a comprehensive MRSA colonization surveillance program. It is not intended to diagnose MRSA infection nor to guide or monitor treatment  for MRSA infections. RESULT CALLED TO, READ BACK BY AND VERIFIED WITH: P SWEGER,RN @0328  02/22/16 MKELLY    Studies/Results: Dg Tibia/fibula Left  02/21/2016  CLINICAL DATA:  Concern for metallic foreign body in left lower extremity EXAM: LEFT TIBIA AND FIBULA - 2 VIEW COMPARISON:  None. FINDINGS: Frontal and lateral views were obtained. There is a linear metallic foreign body posterior and medial to the proximal tibial diaphysis consistent with a small wire fragment. This fragment measures 5 mm in length. No other radiopaque foreign body evident. There is evidence of old trauma involving the proximal tibial diaphysis with benign-appearing periosteal reaction in this area. No acute fracture or dislocation. No blastic or lytic bone lesion. There is degenerative change in the visualized left knee joint. IMPRESSION: 5 mm long linear metallic foreign body posterior and medial to the proximal tibia. Evidence of old trauma involving the proximal to mid tibial diaphysis with benign-appearing periosteal reaction this area. No acute fracture or dislocation. Moderate osteoarthritic change noted in visualized left knee joint. Electronically Signed   By: Lowella Grip III M.D.   On: 02/21/2016 10:11   Ct Angio Neck W/cm &/or Wo/cm  02/21/2016  CLINICAL DATA:  Previous right carotid endarterectomy.  Left stroke. EXAM: CT ANGIOGRAPHY NECK TECHNIQUE: Multidetector CT imaging of the neck was performed using the standard protocol during bolus administration of intravenous contrast. Multiplanar CT image reconstructions and MIPs were obtained to evaluate the vascular  anatomy. Carotid stenosis measurements (when applicable) are obtained utilizing NASCET criteria, using the distal internal carotid diameter as the denominator. CONTRAST:  32mL OMNIPAQUE IOHEXOL 350 MG/ML SOLN COMPARISON:  MRI same day FINDINGS: Aortic arch: Ordinary mild atherosclerosis of the aortic arch but no aneurysm or dissection. Branching pattern of  the brachiocephalic vessels from the arch is normal, without origin stenosis. Right carotid system: Common carotid artery widely patent to the bifurcation. Previous carotid endarterectomy with wide patency in the carotid bulb. There is some atherosclerotic plaque affecting the internal carotid artery just beyond the surgical level. The vessel is narrowed to a minimal diameter of 3 mm. Compared to a more distal cervical ICA diameter of 5 mm, this indicates 40% stenosis. The ICA is tortuous beneath the skullbase but widely patent. Left carotid system: Common carotid artery is widely patent to the bifurcation region. There is atherosclerotic disease at the carotid bifurcation but no stenosis in the ICA bulb region. Distal to the bulb, the ICA shows narrowing apparently due to soft plaque with minimal diameter of 3 mm. Compared to a distal cervical ICA diameter of 5 mm, this indicates a 40% stenosis. The vessel is tortuous just beneath the skullbase. Vertebral arteries:The left vertebral artery is dominant. Left vertebral artery origin is widely patent. The vessel is widely patent through the cervical region and through the foramen magnum to the basilar. There is some atherosclerotic disease in the region of the foramen magnum but no stenosis. The right vertebral artery is non dominant. There is atherosclerotic disease at the origin with stenosis possibly 70% or greater. Beyond that, the vessel is patent to the basilar with mild atherosclerotic disease at the foramen magnum level. Skeleton: Ordinary spondylosis Other neck: No mass or lymphadenopathy.  6 lung apices are clear. IMPRESSION: Previous carotid endarterectomy on the right. No stenosis in that region. Soft plaque affecting the ICA distal to the bulb with narrowing to a diameter of 3 mm indicating 40% stenosis. Atherosclerotic disease at the carotid bifurcation on the left. No stenosis at the bifurcation or within the ICA bulb. Soft plaque of the ICA distal to  the bulb with narrowing to a diameter of 3 mm indicating 40% stenosis. High-grade stenosis of the nondominant right vertebral artery origin, 70% or greater. Dominant left vertebral artery widely patent. Electronically Signed   By: Nelson Chimes M.D.   On: 02/21/2016 15:02   Mr Brain Wo Contrast  02/21/2016  ADDENDUM REPORT: 02/21/2016 12:38 ADDENDUM: Correction:  Acute infarct is in the LEFT medial parietal lobe. Electronically Signed   By: Franchot Gallo M.D.   On: 02/21/2016 12:38  02/21/2016  CLINICAL DATA:  Left arm weakness, resolved. Diabetes, hypertension, hyperlipidemia, former smoker EXAM: MRI HEAD WITHOUT CONTRAST MRA HEAD WITHOUT CONTRAST TECHNIQUE: Multiplanar, multiecho pulse sequences of the brain and surrounding structures were obtained without intravenous contrast. Angiographic images of the head were obtained using MRA technique without contrast. COMPARISON:  CT head 02/21/2016 FINDINGS: MRI HEAD FINDINGS Small focus of acute infarct in the high left medial parietal lobe. No other area of acute infarct. Chronic infarct over the left frontal convexity. Moderate chronic microvascular ischemic changes throughout the white matter. Mild chronic ischemia in the basal ganglia. Cerebellum and brainstem show no evidence of infarction Chronic micro hemorrhage right caudate. No other areas of hemorrhage. Negative for mass or edema. Generalized atrophy.  No hydrocephalus.  Pituitary normal in size. Moderate mucosal edema paranasal sinuses. Mastoid sinus clear. No skull lesion. MRA HEAD FINDINGS Both vertebral arteries  patent to the basilar. Left vertebral dominant. Left PICA patent. Right PICA not visualized. Basilar artery patent. Superior cerebellar and posterior cerebral arteries patent without stenosis. Internal carotid artery patent bilaterally without stenosis. Anterior and middle cerebral arteries patent without stenosis. Negative for cerebral aneurysm. IMPRESSION: Small area of acute infarct in the  right medial parietal lobe measuring 5 mm. No other area of acute infarct Chronic ischemic changes throughout the white matter. Chronic cortical infarct over the left frontal convexity. Chronic micro hemorrhage right caudate. Electronically Signed: By: Franchot Gallo M.D. On: 02/21/2016 11:28   Mr Jodene Nam Head/brain Wo Cm  02/21/2016  ADDENDUM REPORT: 02/21/2016 12:38 ADDENDUM: Correction:  Acute infarct is in the LEFT medial parietal lobe. Electronically Signed   By: Franchot Gallo M.D.   On: 02/21/2016 12:38  02/21/2016  CLINICAL DATA:  Left arm weakness, resolved. Diabetes, hypertension, hyperlipidemia, former smoker EXAM: MRI HEAD WITHOUT CONTRAST MRA HEAD WITHOUT CONTRAST TECHNIQUE: Multiplanar, multiecho pulse sequences of the brain and surrounding structures were obtained without intravenous contrast. Angiographic images of the head were obtained using MRA technique without contrast. COMPARISON:  CT head 02/21/2016 FINDINGS: MRI HEAD FINDINGS Small focus of acute infarct in the high left medial parietal lobe. No other area of acute infarct. Chronic infarct over the left frontal convexity. Moderate chronic microvascular ischemic changes throughout the white matter. Mild chronic ischemia in the basal ganglia. Cerebellum and brainstem show no evidence of infarction Chronic micro hemorrhage right caudate. No other areas of hemorrhage. Negative for mass or edema. Generalized atrophy.  No hydrocephalus.  Pituitary normal in size. Moderate mucosal edema paranasal sinuses. Mastoid sinus clear. No skull lesion. MRA HEAD FINDINGS Both vertebral arteries patent to the basilar. Left vertebral dominant. Left PICA patent. Right PICA not visualized. Basilar artery patent. Superior cerebellar and posterior cerebral arteries patent without stenosis. Internal carotid artery patent bilaterally without stenosis. Anterior and middle cerebral arteries patent without stenosis. Negative for cerebral aneurysm. IMPRESSION: Small area  of acute infarct in the right medial parietal lobe measuring 5 mm. No other area of acute infarct Chronic ischemic changes throughout the white matter. Chronic cortical infarct over the left frontal convexity. Chronic micro hemorrhage right caudate. Electronically Signed: By: Franchot Gallo M.D. On: 02/21/2016 11:28   Medications: I have reviewed the patient's current medications. Scheduled Meds: . atorvastatin  40 mg Oral Daily  . clopidogrel  75 mg Oral Daily  . enoxaparin (LOVENOX) injection  40 mg Subcutaneous Q24H  . insulin aspart  0-15 Units Subcutaneous TID WC  . insulin detemir  50 Units Subcutaneous BID  . loratadine  10 mg Oral Daily   Continuous Infusions:  PRN Meds:.acetaminophen **OR** acetaminophen, senna-docusate Assessment/Plan: Ischemic stroke: Distribution of old and new infarcts on left and right sides is concerning for cardioembolic etiology. TTE was fairly benign without an obvious source. Based on this he will undergo TEE for better visualization. Also loop recorder to rule out paroxysmal atrial fibrillation given that he has been in sinus rhythm throughout this admission. He is not currently interested in this option but wants to discuss this with his son later today. -TEE today -Discuss loop recorder w/ family, deferred vs immediate vs outpatient arrangement -Plavix 75mg   Management of other chronic medical problems stable  Diet: NPO until TEE DVT ppx: [place] lovenox FULL CODE  Dispo: Possibly to home tonight or in AM, depending on completion of workup and results.   LOS: 1 day   Collier Salina, MD 02/23/2016, 9:12 AM

## 2016-02-23 NOTE — Procedures (Signed)
Pt does not wish to wear cpap will inform RT if anything changes. 

## 2016-02-23 NOTE — Interval H&P Note (Signed)
History and Physical Interval Note:  02/23/2016 4:10 PM  Sean Hayden  has presented today for surgery, with the diagnosis of cryptogenic stroke  The various methods of treatment have been discussed with the patient and family. After consideration of risks, benefits and other options for treatment, the patient has consented to  Procedure(s): Loop Recorder Insertion (N/A) as a surgical intervention .  The patient's history has been reviewed, patient examined, no change in status, stable for surgery.  I have reviewed the patient's chart and labs.  Questions were answered to the patient's satisfaction.     Thompson Grayer

## 2016-02-23 NOTE — Progress Notes (Signed)
STROKE TEAM PROGRESS NOTE   HISTORY OF PRESENT ILLNESS Sean Hayden is a 70 y.o. male who reports being in his normal state of health until 12:45 AM last night when he began having left arm numbness, tingling, and weakness while watching a movie. He thought he was having a heart attack and called EMS. He took two baby aspirin. He also had left mouth tingling that lasted only 5-10 seconds with associated dysarthria. His left arm symptoms resolved after about 10-15 minutes. He had right endarterectomy about 3 years ago due to blurry vision. CTA neck on 06/06/15 revealed interval right endarterectomy without residual stenosis, mild left carotid bifurcation plaques, and moderate right vertebral artery stenosis. He has been taking aspirin 81 mg BID for the past year and before that was taking it daily. He is compliant with taking anti-hypertensive, statin, and diabetic medications. On admission to the ED he was hypertensive to 167/75. CT head on admission revealed no acute finding but did show chronic small vessel disease and remote left MCA/watershed territory infarct. He was not given tPA due to resolution of symptoms.  Date last known well: 3.14.2017 Time last known well: Time: 24:45 tPA Given: No: resolution of symptoms     SUBJECTIVE (INTERVAL HISTORY) The patient's son was at the bedside. TEE and loop performed today. No complains. DVT negative.    OBJECTIVE Temp:  [97.7 F (36.5 C)-98.6 F (37 C)] 98 F (36.7 C) (03/17 1427) Pulse Rate:  [65-79] 73 (03/17 1600) Cardiac Rhythm:  [-] Normal sinus rhythm (03/17 0811) Resp:  [14-21] 17 (03/17 1600) BP: (106-189)/(57-80) 123/79 mmHg (03/17 1600) SpO2:  [93 %-100 %] 95 % (03/17 1600)  CBC:   Recent Labs Lab 02/21/16 0304 02/21/16 0315  WBC 7.1  --   NEUTROABS 4.6  --   HGB 15.4 17.0  HCT 45.7 50.0  MCV 90.9  --   PLT 195  --     Basic Metabolic Panel:   Recent Labs Lab 02/21/16 0304 02/21/16 0315 02/21/16 0828  NA 138 137   --   K 4.5 4.3  --   CL 101 99*  --   CO2 25  --   --   GLUCOSE 159* 153*  --   BUN 13 16  --   CREATININE 0.83 0.70  --   CALCIUM 9.7  --   --   MG  --   --  1.7    Lipid Panel:     Component Value Date/Time   CHOL 113 02/21/2016 0828   TRIG 209* 02/21/2016 0828   HDL 34* 02/21/2016 0828   CHOLHDL 3.3 02/21/2016 0828   VLDL 42* 02/21/2016 0828   LDLCALC 37 02/21/2016 0828   HgbA1c:  Lab Results  Component Value Date   HGBA1C 8.1* 02/21/2016   Urine Drug Screen:     Component Value Date/Time   LABOPIA NONE DETECTED 02/21/2016 1900   COCAINSCRNUR NONE DETECTED 02/21/2016 1900   LABBENZ NONE DETECTED 02/21/2016 1900   AMPHETMU NONE DETECTED 02/21/2016 1900   THCU NONE DETECTED 02/21/2016 1900   LABBARB NONE DETECTED 02/21/2016 1900      IMAGING I have personally reviewed the radiological images below and agree with the radiology interpretations.  Dg Tibia/fibula Left 02/21/2016   5 mm long linear metallic foreign body posterior and medial to the proximal tibia. Evidence of old trauma involving the proximal to mid tibial diaphysis with benign-appearing periosteal reaction this area. No acute fracture or dislocation. Moderate osteoarthritic change noted in  visualized left knee joint.  Ct Head Wo Contrast 02/21/2016   1. No acute finding.  2. Chronic small vessel disease and remote left MCA/watershed territory infarct.   Ct Angio Neck W/cm &/or Wo/cm 02/21/2016   Previous carotid endarterectomy on the right. No stenosis in that region. Soft plaque affecting the ICA distal to the bulb with narrowing to a diameter of 3 mm indicating 40% stenosis. Atherosclerotic disease at the carotid bifurcation on the left. No stenosis at the bifurcation or within the ICA bulb. Soft plaque of the ICA distal to the bulb with narrowing to a diameter of 3 mm indicating 40% stenosis. High-grade stenosis of the nondominant right vertebral artery origin, 70% or greater. Dominant left vertebral  artery widely patent.   Mr Jodene Nam Head/brain Wo Cm 02/21/2016   Small area of acute infarct in the left medial parietal lobe measuring 5 mm. No other area of acute infarct Chronic ischemic changes throughout the white matter. Chronic cortical infarct over the left frontal convexity. Chronic micro hemorrhage right caudate.  TTE  Impressions: - LVEF 60-65%, mild LVH, normal wall motion, diastolic dysfunction,  indeterminate LV Filling pressure, moderate to severe aortic  stenosis - mean gradient of 17 mmHg, calculated AVA around 1 cm2.  Mild LAE, moderate RAE, normal IVC.   TEE 02/23/2016 IMPRESSION:   1. No LAA thrombus 2. Negative for PFO 3. Moderate congenital AS with AVA of 1.32 cm2 by planimetry 4. Mild MR, AI 5. LVEF 60-65% with normal wall motion   LE venous doppler - negative for DVT   PHYSICAL EXAM  Temp:  [97.7 F (36.5 C)-98.6 F (37 C)] 98 F (36.7 C) (03/17 1427) Pulse Rate:  [65-79] 73 (03/17 1600) Resp:  [14-21] 17 (03/17 1600) BP: (106-189)/(57-80) 123/79 mmHg (03/17 1600) SpO2:  [93 %-100 %] 95 % (03/17 1600)  General - Well nourished, well developed, in no apparent distress.  Ophthalmologic - Sharp disc margins OU.   Cardiovascular - Regular rate and rhythm with no murmur.  Mental Status -  Level of arousal and orientation to time, place, and person were intact. Language including expression, naming, repetition, comprehension was assessed and found intact. Fund of Knowledge was assessed and was intact.  Cranial Nerves II - XII - II - Visual field intact OU. III, IV, VI - Extraocular movements intact. V - Facial sensation intact bilaterally. VII - Facial movement intact bilaterally. VIII - Hearing & vestibular intact bilaterally. X - Palate elevates symmetrically. XI - Chin turning & shoulder shrug intact bilaterally. XII - Tongue protrusion intact.  Motor Strength - The patient's strength was normal in all extremities and pronator drift was  absent.  Bulk was normal and fasciculations were absent.   Motor Tone - Muscle tone was assessed at the neck and appendages and was normal.  Reflexes - The patient's reflexes were 1+ in all extremities and he had no pathological reflexes.  Sensory - Light touch, temperature/pinprick, vibration and proprioception, and Romberg testing were assessed and were symmetrical.    Coordination - The patient had normal movements in the hands and feet with no ataxia or dysmetria.  Tremor was absent.  Gait and Station - The patient's transfers, posture, gait, station, and turns were observed as normal.    ASSESSMENT/PLAN Mr. Sean Hayden is a 70 y.o. male with history of diabetes mellitus, hypertension, MRSA infection, neuropathy, previous right carotid endarterectomy, presenting with transient left arm weakness and numbness. He did not receive IV t-PA due to resolution of deficits.  Stroke:  Left medial parietal punctate infarct and old  possibly embolic from an unknown source. Pt had symptom on the left arm and mouth, which not related to current acute infarct. Due to bilateral involvement, he needs further embolic work up.   Resultant resolution of deficits  MRI  small area of acute infarct in the left medial parietal lobe.  MRA head Unremarkable  CTA of the neck - left ICA proximal and b/l siphon athero but no significant stenosis. nondominant right VA origin, 70% or greater.   LE venous Dopplers - no DVT  2D Echo - unremarkable as noted above  TEE - unremarkable as noted above  Loop implanted  LDL - 37  HgbA1c 8.1 VTE prophylaxis - Lovenox  Diet Carb Modified Fluid consistency:: Thin; Room service appropriate?: Yes  aspirin 81 mg daily prior to admission, now on clopidogrel 75 mg daily. Continue plavix on discharge.   Patient counseled to be compliant with his antithrombotic medications  Ongoing aggressive stroke risk factor management  Therapy recommendations:  CIR  Disposition:  Pending  Hypertension  Stable  Permissive hypertension (OK if < 220/120) but gradually normalize in 5-7 days  Hyperlipidemia  Home meds:  Lipitor 40 mg daily resumed in hospital  LDL 37, goal < 70  Continue statin at discharge  Diabetes  HgbA1c 8.1, goal < 7.0  Uncontrolled  On levemir  On SSI  Tobacco abuse  Current smoker  Smoking cessation counseling provided  Pt is willing to quit  Other Stroke Risk Factors  Advanced age  Obesity, Body mass index is 39.02 kg/(m^2).   Hx stroke - by MRI  Other [facility] day # 1  Neurology will sign off. Please call with questions. Pt will follow up with Dr. Erlinda Hong at [facility] in about 2 months. Thanks for the consult.  Rosalin Hawking, MD PhD Stroke Neurology 02/23/2016 11:41 PM      To contact Stroke Continuity provider, please refer to [email or web address] After hours, contact General Neurology

## 2016-02-23 NOTE — H&P (Signed)
    INTERVAL PROCEDURE H&P  History and Physical Interval Note:  02/23/2016 2:33 PM  Sean Hayden has presented today for their planned procedure. The various methods of treatment have been discussed with the patient and family. After consideration of risks, benefits and other options for treatment, the patient has consented to the procedure.  The patients' outpatient history has been reviewed, patient examined, and no change in status from most recent office note within the past 30 days. I have reviewed the patients' chart and labs and will proceed as planned. Questions were answered to the patient's satisfaction.   Pixie Casino, MD, [facility] Attending Cardiologist [place] 02/23/2016, 2:33 PM

## 2016-02-23 NOTE — Consult Note (Signed)
ELECTROPHYSIOLOGY CONSULT NOTE  Patient ID: Sean Hayden MRN: [id number], DOB/AGE: 1946/04/03   Admit date: 02/21/2016 Date of Consult: 02/23/2016  Primary Physician: No primary care provider on file. Primary Cardiologist: None Reason for Consultation: Cryptogenic stroke; recommendations regarding Implantable Loop Recorder  History of Present Illness Sean Hayden was admitted on 02/21/2016 with an acute CVA.  They first developed symptoms while watching a movie.  He PMHx of PVD (hx of R CEA), HTN, DM.   Imaging demonstrated Left medial parietal punctate infarct and old possibly embolic from an unknown source.  he has undergone workup for stroke including echocardiogram and carotid angio.  The patient has been monitored on telemetry which has demonstrated sinus rhythm with no arrhythmias.  Inpatient stroke work-up is to be completed with a TEE this afternoon.   The patient feels well this morning, denies any kind of symptoms, and reports his LEU mouth numbness remains resolved.  Echocardiogram this admission demonstrated  Study Conclusions - Left ventricle: The cavity size was normal. Wall thickness was  increased in a pattern of mild LVH. Systolic function was normal.  The estimated ejection fraction was in the range of 60% to 65%.  Wall motion was normal; there were no regional wall motion  abnormalities. Doppler parameters are consistent with abnormal  left ventricular relaxation (grade 1 diastolic dysfunction). The  E/e&' ratio is between 8-15, suggesting indeterminate LV filling  pressure. - Aortic valve: Moderately calcified with restricted leaflet  motion. Moderate to severe stenosis. Mean gradient (S): 17 mm Hg.  Peak gradient (S): 28 mm Hg. Valve area (VTI): 0.93 cm^2. Valve  area (Vmax): 0.99 cm^2. Valve area (Vmean): 0.86 cm^2. - Left atrium: The atrium was mildly dilated at 38 ml/m2. - Right ventricle: The cavity size was normal. Wall thickness was  normal. The  moderator band was prominent. Systolic function was  normal. - Right atrium: Moderately dilated at 26 cm2. - Inferior vena cava: The vessel was normal in size. The  respirophasic diameter changes were in the normal range (>= 50%),  consistent with normal central venous pressure. Impressions: - LVEF 60-65%, mild LVH, normal wall motion, diastolic dysfunction,  indeterminate LV Filling pressure, moderate to severe aortic  stenosis - mean gradient of 17 mmHg, calculated AVA around 1 cm2.  Mild LAE, moderate RAE, normal IVC.    Lab work is reviewed.  Prior to admission, the patient denies chest pain, shortness of breath, dizziness, palpitations, or syncope.  They are recovering from their stroke with disposition pending  EP has been asked to evaluate for placement of an implantable loop recorder to monitor for atrial fibrillation.     Past Medical History  Diagnosis Date  . Diabetes mellitus without complication ([place])   . Hypertension   . Gout   . MRSA infection   . Neuropathy ([place])   . History of right-sided carotid endarterectomy 04/17/12  . B12 deficiency   . OSA (obstructive sleep apnea)   . Diverticulosis   . Hemorrhoids   . Nephrolithiasis      Surgical History:  Past Surgical History  Procedure Laterality Date  . Carotid artery angioplasty Right 04/17/2012    Dr. Delana Meyer, [facility]  . Hemorrhoid surgery  2009  . Cholecystectomy  2005    lap     Prescriptions prior to admission  Medication Sig Dispense Refill Last Dose  . acetaminophen (TYLENOL) 650 MG CR tablet Take 1,300 mg by mouth 2 (two) times daily.   02/20/2016 at Unknown time  .  acyclovir (ZOVIRAX) 200 MG capsule Take 200 mg by mouth 5 (five) times daily as needed (for fever blisters).   02/20/2016 at Unknown time  . atorvastatin (LIPITOR) 40 MG tablet Take 20 mg by mouth daily.   02/20/2016 at Unknown time  . canagliflozin (INVOKANA) 300 MG TABS tablet Take 300 mg by mouth daily before breakfast.    02/20/2016 at Unknown time  . Cholecalciferol (VITAMIN D PO) Take 1 tablet by mouth daily.   02/20/2016 at Unknown time  . Cyanocobalamin (VITAMIN B 12 PO) Take 1 tablet by mouth 2 (two) times daily.   02/20/2016 at Unknown time  . Flaxseed, Linseed, (FLAXSEED OIL) 1000 MG CAPS Take 1,000 mg by mouth 2 (two) times daily.   02/20/2016 at Unknown time  . insulin detemir (LEVEMIR) 100 UNIT/ML injection Inject 65 Units into the skin 2 (two) times daily.   02/20/2016 at Unknown time  . lisinopril (PRINIVIL,ZESTRIL) 5 MG tablet Take 5 mg by mouth daily.   02/20/2016 at Unknown time  . loratadine (CLARITIN) 10 MG tablet Take 10 mg by mouth daily.   02/20/2016 at Unknown time  . metFORMIN (GLUCOPHAGE) 1000 MG tablet Take 1,000 mg by mouth 3 (three) times daily.   02/20/2016 at Unknown time  . Misc Natural Products (OSTEO BI-FLEX TRIPLE STRENGTH PO) Take 1 tablet by mouth 2 (two) times daily.   02/20/2016 at Unknown time  . Omega-3 Fatty Acids (FISH OIL) 1200 MG CAPS Take 2,400 mg by mouth daily.   02/20/2016 at Unknown time  . UNABLE TO FIND Take 2 capsules by mouth daily at 2 am. Med Name: prosvent 355   02/20/2016 at Unknown time    Inpatient Medications:  . atorvastatin  40 mg Oral Daily  . clopidogrel  75 mg Oral Daily  . enoxaparin (LOVENOX) injection  40 mg Subcutaneous Q24H  . insulin aspart  0-15 Units Subcutaneous TID WC  . insulin detemir  50 Units Subcutaneous BID  . loratadine  10 mg Oral Daily    Allergies:  Allergies  Allergen Reactions  . Actos [Pioglitazone]     Social History   Social History  . Marital Status: Married    Spouse Name: N/A  . Number of Children: N/A  . Years of Education: N/A   Occupational History  . Not on file.   Social History Main Topics  . Smoking status: Former Research scientist (life sciences)  . Smokeless tobacco: Not on file     Comment: cigarettes and cigars  . Alcohol Use: No     Comment: Former heavy alcohol user  . Drug Use: No  . Sexual Activity: Not on file   Other  Topics Concern  . Not on file   Social History Narrative     Family History  Problem Relation Age of Onset  . Hodgkin's lymphoma Father       Review of Systems: All other systems reviewed and are otherwise negative except as noted above.  Physical Exam: Filed Vitals:   02/22/16 2206 02/23/16 0149 02/23/16 0634 02/23/16 0855  BP: 124/68 128/76 133/66 130/78  Pulse: 78 73 71 72  Temp: 98.4 F (36.9 C) 98.6 F (37 C) 98.4 F (36.9 C) 97.7 F (36.5 C)  TempSrc: Oral Oral Oral Oral  Resp: 18 18  20   Height:      Weight:      SpO2: 94% 95% 93% 94%    GEN- The patient is well appearing, alert and oriented x 3 today.   Head- normocephalic,  atraumatic Eyes-  Sclera clear, conjunctiva pink Ears- hearing intact Oropharynx- clear Neck- supple Lungs- Clear to ausculation bilaterally, normal work of breathing Heart- Regular rate and rhythm, no murmurs, rubs or gallops  GI- soft, NT, ND, + BS Extremities- no clubbing, cyanosis, or edema MS- no significant deformity or atrophy Skin- no rash or lesion Psych- euthymic mood, full affect   Labs:   Lab Results  Component Value Date   WBC 7.1 02/21/2016   HGB 17.0 02/21/2016   HCT 50.0 02/21/2016   MCV 90.9 02/21/2016   PLT 195 02/21/2016    Recent Labs Lab 02/21/16 0304 02/21/16 0315  NA 138 137  K 4.5 4.3  CL 101 99*  CO2 25  --   BUN 13 16  CREATININE 0.83 0.70  CALCIUM 9.7  --   PROT 7.2  --   BILITOT 0.6  --   ALKPHOS 70  --   ALT 23  --   AST 23  --   GLUCOSE 159* 153*   No results found for: CKTOTAL, CKMB, CKMBINDEX, TROPONINI Lab Results  Component Value Date   CHOL 113 02/21/2016   Lab Results  Component Value Date   HDL 34* 02/21/2016   Lab Results  Component Value Date   LDLCALC 37 02/21/2016   Lab Results  Component Value Date   TRIG 209* 02/21/2016   Lab Results  Component Value Date   CHOLHDL 3.3 02/21/2016   No results found for: LDLDIRECT  No results found for: DDIMER     Radiology/Studies:  Ct Head Wo Contrast 02/21/2016  CLINICAL DATA:  Left arm numbness for 15 minutes. Symptoms have resolved. Initial encounter. EXAM: CT HEAD WITHOUT CONTRAST TECHNIQUE: Contiguous axial images were obtained from the base of the skull through the vertex without intravenous contrast. COMPARISON:  None. FINDINGS: Skull and Sinuses:No acute osseous finding. Moderate patchy sinusitis. Visualized orbits: Left cataract resection.  No acute finding. Brain: No evidence of acute infarction, hemorrhage, hydrocephalus, or mass lesion/mass effect. There is cortical and subcortical gliosis in the high left cerebral hemisphere from remote infarct, at the upper border zone territory. There is extensive patchy small vessel ischemic change in the cerebral white matter. Intracranial calcified atherosclerosis. No explanation for left arm numbness. A left M2 branch is notably high-density at the insular level but presumably atherosclerotic/chronic given the asymmetric symptomatology. IMPRESSION: 1. No acute finding. 2. Chronic small vessel disease and remote left MCA/watershed territory infarct. Electronically Signed   By: Monte Fantasia M.D.   On: 02/21/2016 04:35   Ct Angio Neck W/cm &/or Wo/cm 02/21/2016  CLINICAL DATA:  Previous right carotid endarterectomy.  Left stroke. EXAM: CT ANGIOGRAPHY NECK TECHNIQUE: Multidetector CT imaging of the neck was performed using the standard protocol during bolus administration of intravenous contrast. Multiplanar CT image reconstructions and MIPs were obtained to evaluate the vascular anatomy. Carotid stenosis measurements (when applicable) are obtained utilizing NASCET criteria, using the distal internal carotid diameter as the denominator. CONTRAST:  51mL OMNIPAQUE IOHEXOL 350 MG/ML SOLN COMPARISON:  MRI same day FINDINGS: Aortic arch: Ordinary mild atherosclerosis of the aortic arch but no aneurysm or dissection. Branching pattern of the brachiocephalic vessels from the  arch is normal, without origin stenosis. Right carotid system: Common carotid artery widely patent to the bifurcation. Previous carotid endarterectomy with wide patency in the carotid bulb. There is some atherosclerotic plaque affecting the internal carotid artery just beyond the surgical level. The vessel is narrowed to a minimal diameter of 3 mm. Compared  to a more distal cervical ICA diameter of 5 mm, this indicates 40% stenosis. The ICA is tortuous beneath the skullbase but widely patent. Left carotid system: Common carotid artery is widely patent to the bifurcation region. There is atherosclerotic disease at the carotid bifurcation but no stenosis in the ICA bulb region. Distal to the bulb, the ICA shows narrowing apparently due to soft plaque with minimal diameter of 3 mm. Compared to a distal cervical ICA diameter of 5 mm, this indicates a 40% stenosis. The vessel is tortuous just beneath the skullbase. Vertebral arteries:The left vertebral artery is dominant. Left vertebral artery origin is widely patent. The vessel is widely patent through the cervical region and through the foramen magnum to the basilar. There is some atherosclerotic disease in the region of the foramen magnum but no stenosis. The right vertebral artery is non dominant. There is atherosclerotic disease at the origin with stenosis possibly 70% or greater. Beyond that, the vessel is patent to the basilar with mild atherosclerotic disease at the foramen magnum level. Skeleton: Ordinary spondylosis Other neck: No mass or lymphadenopathy.  6 lung apices are clear. IMPRESSION: Previous carotid endarterectomy on the right. No stenosis in that region. Soft plaque affecting the ICA distal to the bulb with narrowing to a diameter of 3 mm indicating 40% stenosis. Atherosclerotic disease at the carotid bifurcation on the left. No stenosis at the bifurcation or within the ICA bulb. Soft plaque of the ICA distal to the bulb with narrowing to a diameter  of 3 mm indicating 40% stenosis. High-grade stenosis of the nondominant right vertebral artery origin, 70% or greater. Dominant left vertebral artery widely patent. Electronically Signed   By: Nelson Chimes M.D.   On: 02/21/2016 15:02   Mr Brain Wo Contrast 02/21/2016  ADDENDUM REPORT: 02/21/2016 12:38 ADDENDUM: Correction:  Acute infarct is in the LEFT medial parietal lobe. Electronically Signed   By: Franchot Gallo M.D.   On: 02/21/2016 12:38 02/21/2016  CLINICAL DATA:  Left arm weakness, resolved. Diabetes, hypertension, hyperlipidemia, former smoker EXAM: MRI HEAD WITHOUT CONTRAST MRA HEAD WITHOUT CONTRAST TECHNIQUE: Multiplanar, multiecho pulse sequences of the brain and surrounding structures were obtained without intravenous contrast. Angiographic images of the head were obtained using MRA technique without contrast. COMPARISON:  CT head 02/21/2016 FINDINGS: MRI HEAD FINDINGS Small focus of acute infarct in the high left medial parietal lobe. No other area of acute infarct. Chronic infarct over the left frontal convexity. Moderate chronic microvascular ischemic changes throughout the white matter. Mild chronic ischemia in the basal ganglia. Cerebellum and brainstem show no evidence of infarction Chronic micro hemorrhage right caudate. No other areas of hemorrhage. Negative for mass or edema. Generalized atrophy.  No hydrocephalus.  Pituitary normal in size. Moderate mucosal edema paranasal sinuses. Mastoid sinus clear. No skull lesion. MRA HEAD FINDINGS Both vertebral arteries patent to the basilar. Left vertebral dominant. Left PICA patent. Right PICA not visualized. Basilar artery patent. Superior cerebellar and posterior cerebral arteries patent without stenosis. Internal carotid artery patent bilaterally without stenosis. Anterior and middle cerebral arteries patent without stenosis. Negative for cerebral aneurysm. IMPRESSION: Small area of acute infarct in the right medial parietal lobe measuring 5 mm.  No other area of acute infarct Chronic ischemic changes throughout the white matter. Chronic cortical infarct over the left frontal convexity. Chronic micro hemorrhage right caudate. Electronically Signed: By: Franchot Gallo M.D. On: 02/21/2016 11:28     12-lead ECG SR All prior EKG's in EPIC reviewed with no documented  atrial fibrillation  Telemetry SR  Assessment and Plan:  1. Cryptogenic stroke The patient presents with cryptogenic stroke.  The patient has a TEE planned for this afternoon.  I spoke at length with the patient about monitoring for afib with either a 30 day event monitor or an implantable loop recorder.  Risks, benefits, and alteratives to implantable loop recorder were discussed with the patient today.   At this time, the patient is unagreeable to a 30 day monitor (feels it would be to cumbersome and difficult to manage) and very undecided and at least at this time, does not feel like he would want to proceed with ILR and would like to discuss with his son and wife, he follows out patient at the [place] for all his health care needs and thinks he would prefer to f/u there first, he also has concerns about cost associated with the monthly monitoring.    2. Incidentally, mod-severe AS is noted on his TTE     TEE is pending     No history of syncope or CHF     Renee Dyane Dustman, PA-C 02/23/2016  I have seen, examined the patient, and reviewed the above assessment and plan.  On exam, RRR.  TEE reviewed and normal. Changes to above are made where necessary.   Will proceed with ILR implant at this time  Co Sign: Thompson Grayer, MD 02/23/2016 4:09 PM

## 2016-02-23 NOTE — Progress Notes (Signed)
Internal Medicine Attending:   I saw and examined the patient. I reviewed the resident's note and I agree with the resident's findings and plan as documented in the resident's note.  70 year old man admitted with acute ischemic CVA. On second review of his imaging our team noticed that there is now an addendum to the MRI which says that his ischemic CVA was in the left parietal lobe which is inconsistent with his presentation of left arm weakness. I agree with neurology that this raises enough possibility for embolic source that we should evaluate his left atrial appendage with transesophageal echocardiogram and potentially implantable cardiac monitor. The TEE is scheduled for later today. Depending on the results, and the timing, the patient could potentially go home this evening. If he is slow to recover from the sedation, we may have him stay in house tonight.

## 2016-02-24 ENCOUNTER — Encounter (HOSPITAL_COMMUNITY): Payer: Self-pay | Admitting: Internal Medicine

## 2016-02-24 LAB — GLUCOSE, CAPILLARY: Glucose-Capillary: 143 mg/dL — ABNORMAL HIGH (ref 65–99)

## 2016-02-24 MED ORDER — METFORMIN HCL 1000 MG PO TABS: 1000.0000 mg | ORAL_TABLET | Freq: Two times a day (BID) | ORAL | Status: DC

## 2016-02-24 NOTE — Discharge Summary (Signed)
Name: Sean Hayden MRN: [id number] DOB: Dec 17, 1945 70 y.o. PCP: Birdie Sons, MD  Date of Admission: 02/21/2016  6:28 AM Date of Discharge: 02/24/2016 Attending Physician: Dr. Lalla Brothers  Discharge Diagnosis: Principal Problem:   Left-sided cerebrovascular accident (CVA) [facility]) Active Problems:   Diabetes mellitus with neuropathy ([place])   Hyperlipidemia   Hypertension   History of right-sided carotid endarterectomy   Gout   Allergic rhinitis   B12 deficiency   Acute CVA (cerebrovascular accident) ([place])   Aortic valve stenosis, congenital  Discharge Medications:   Medication List    TAKE these medications        acetaminophen 650 MG CR tablet  Commonly known as:  TYLENOL  Take 1,300 mg by mouth 2 (two) times daily.     acyclovir 200 MG capsule  Commonly known as:  ZOVIRAX  Take 200 mg by mouth 5 (five) times daily as needed (for fever blisters).     atorvastatin 40 MG tablet  Commonly known as:  LIPITOR  Take 20 mg by mouth daily.     clopidogrel 75 MG tablet  Commonly known as:  PLAVIX  Take 1 tablet (75 mg total) by mouth daily.     Fish Oil 1200 MG Caps  Take 2,400 mg by mouth daily.     Flaxseed Oil 1000 MG Caps  Take 1,000 mg by mouth 2 (two) times daily.     insulin detemir 100 UNIT/ML injection  Commonly known as:  LEVEMIR  Inject 65 Units into the skin 2 (two) times daily.     INVOKANA 300 MG Tabs tablet  Generic drug:  canagliflozin  Take 300 mg by mouth daily before breakfast.     lisinopril 5 MG tablet  Commonly known as:  PRINIVIL,ZESTRIL  Take 5 mg by mouth daily.     loratadine 10 MG tablet  Commonly known as:  CLARITIN  Take 10 mg by mouth daily.     metFORMIN 1000 MG tablet  Commonly known as:  GLUCOPHAGE  Take 1 tablet (1,000 mg total) by mouth 2 (two) times daily with a meal.     OSTEO BI-FLEX TRIPLE STRENGTH PO  Take 1 tablet by mouth 2 (two) times daily.     UNABLE TO FIND  Take 2 capsules by mouth daily at 2 am.  Med Name: prosvent 355     VITAMIN B 12 PO  Take 1 tablet by mouth 2 (two) times daily.     VITAMIN D PO  Take 1 tablet by mouth daily.        Disposition and follow-up:   [name] was discharged from [facility] in Good condition.  At the hospital follow up visit please address:  Left sided ischemic stroke: Discharged on plavix with no residual focal deficits. An acute infarct was noted in the medial left parietal lobe that would not explain his left arm and facial weakness strongly suggesting a cardioembolic source but none identified on imaging. He was started on plavix. Continued tight blood pressure and diabetes control will be important.  Implantable loop recorder: Implanted on 02/23/16 to investigate for paroxysmal arrhythmias given a stroke pattern highly suggestive of cardioembolic source. Following up with [facility] cardiology clinic 3/29.   Follow-up Appointments: Follow-up Information    Follow up with [facility] On 03/06/2016.   Specialty:  Cardiology   Why:  2:30PM   Contact information:   [address], [place]  [address] [phone]      Follow up with Xu,Jindong, MD. Schedule an appointment as soon as possible for a visit in 2 months.   Specialty:  Neurology   Why:  stroke clinic   Contact information:   [place] [id number] [phone]       Discharge Instructions: Discharge Instructions    Ambulatory referral to Neurology    Complete by:  As directed   Pt will follow up with Dr. Erlinda Hong at [facility] in about 2 months. Thanks.     Increase activity slowly    Complete by:  As directed            Consultations:    Procedures Performed:  Dg Tibia/fibula Left  02/21/2016  CLINICAL DATA:  Concern for metallic foreign body in left lower extremity EXAM: LEFT TIBIA AND FIBULA - 2 VIEW COMPARISON:  None. FINDINGS: Frontal and lateral views were obtained. There is  a linear metallic foreign body posterior and medial to the proximal tibial diaphysis consistent with a small wire fragment. This fragment measures 5 mm in length. No other radiopaque foreign body evident. There is evidence of old trauma involving the proximal tibial diaphysis with benign-appearing periosteal reaction in this area. No acute fracture or dislocation. No blastic or lytic bone lesion. There is degenerative change in the visualized left knee joint. IMPRESSION: 5 mm long linear metallic foreign body posterior and medial to the proximal tibia. Evidence of old trauma involving the proximal to mid tibial diaphysis with benign-appearing periosteal reaction this area. No acute fracture or dislocation. Moderate osteoarthritic change noted in visualized left knee joint. Electronically Signed   By: Lowella Grip III M.D.   On: 02/21/2016 10:11   Ct Head Wo Contrast  02/21/2016  CLINICAL DATA:  Left arm numbness for 15 minutes. Symptoms have resolved. Initial encounter. EXAM: CT HEAD WITHOUT CONTRAST TECHNIQUE: Contiguous axial images were obtained from the base of the skull through the vertex without intravenous contrast. COMPARISON:  None. FINDINGS: Skull and Sinuses:No acute osseous finding. Moderate patchy sinusitis. Visualized orbits: Left cataract resection.  No acute finding. Brain: No evidence of acute infarction, hemorrhage, hydrocephalus, or mass lesion/mass effect. There is cortical and subcortical gliosis in the high left cerebral hemisphere from remote infarct, at the upper border zone territory. There is extensive patchy small vessel ischemic change in the cerebral white matter. Intracranial calcified atherosclerosis. No explanation for left arm numbness. A left M2 branch is notably high-density at the insular level but presumably atherosclerotic/chronic given the asymmetric symptomatology. IMPRESSION: 1. No acute finding. 2. Chronic small vessel disease and remote left MCA/watershed territory  infarct. Electronically Signed   By: Monte Fantasia M.D.   On: 02/21/2016 04:35   Ct Angio Neck W/cm &/or Wo/cm  02/21/2016  CLINICAL DATA:  Previous right carotid endarterectomy.  Left stroke. EXAM: CT ANGIOGRAPHY NECK TECHNIQUE: Multidetector CT imaging of the neck was performed using the standard protocol during bolus administration of intravenous contrast. Multiplanar CT image reconstructions and MIPs were obtained to evaluate the vascular anatomy. Carotid stenosis measurements (when applicable) are obtained utilizing NASCET criteria, using the distal internal carotid diameter as the denominator. CONTRAST:  56mL OMNIPAQUE IOHEXOL 350 MG/ML SOLN COMPARISON:  MRI same day FINDINGS: Aortic arch: Ordinary mild atherosclerosis of the aortic arch but no aneurysm or dissection. Branching pattern of the brachiocephalic vessels from the arch is normal, without origin stenosis. Right carotid system: Common carotid artery widely patent to the bifurcation. Previous carotid endarterectomy  with wide patency in the carotid bulb. There is some atherosclerotic plaque affecting the internal carotid artery just beyond the surgical level. The vessel is narrowed to a minimal diameter of 3 mm. Compared to a more distal cervical ICA diameter of 5 mm, this indicates 40% stenosis. The ICA is tortuous beneath the skullbase but widely patent. Left carotid system: Common carotid artery is widely patent to the bifurcation region. There is atherosclerotic disease at the carotid bifurcation but no stenosis in the ICA bulb region. Distal to the bulb, the ICA shows narrowing apparently due to soft plaque with minimal diameter of 3 mm. Compared to a distal cervical ICA diameter of 5 mm, this indicates a 40% stenosis. The vessel is tortuous just beneath the skullbase. Vertebral arteries:The left vertebral artery is dominant. Left vertebral artery origin is widely patent. The vessel is widely patent through the cervical region and through the  foramen magnum to the basilar. There is some atherosclerotic disease in the region of the foramen magnum but no stenosis. The right vertebral artery is non dominant. There is atherosclerotic disease at the origin with stenosis possibly 70% or greater. Beyond that, the vessel is patent to the basilar with mild atherosclerotic disease at the foramen magnum level. Skeleton: Ordinary spondylosis Other neck: No mass or lymphadenopathy.  6 lung apices are clear. IMPRESSION: Previous carotid endarterectomy on the right. No stenosis in that region. Soft plaque affecting the ICA distal to the bulb with narrowing to a diameter of 3 mm indicating 40% stenosis. Atherosclerotic disease at the carotid bifurcation on the left. No stenosis at the bifurcation or within the ICA bulb. Soft plaque of the ICA distal to the bulb with narrowing to a diameter of 3 mm indicating 40% stenosis. High-grade stenosis of the nondominant right vertebral artery origin, 70% or greater. Dominant left vertebral artery widely patent. Electronically Signed   By: Nelson Chimes M.D.   On: 02/21/2016 15:02   Mr Brain Wo Contrast  02/21/2016  ADDENDUM REPORT: 02/21/2016 12:38 ADDENDUM: Correction:  Acute infarct is in the LEFT medial parietal lobe. Electronically Signed   By: Franchot Gallo M.D.   On: 02/21/2016 12:38  02/21/2016  CLINICAL DATA:  Left arm weakness, resolved. Diabetes, hypertension, hyperlipidemia, former smoker EXAM: MRI HEAD WITHOUT CONTRAST MRA HEAD WITHOUT CONTRAST TECHNIQUE: Multiplanar, multiecho pulse sequences of the brain and surrounding structures were obtained without intravenous contrast. Angiographic images of the head were obtained using MRA technique without contrast. COMPARISON:  CT head 02/21/2016 FINDINGS: MRI HEAD FINDINGS Small focus of acute infarct in the high left medial parietal lobe. No other area of acute infarct. Chronic infarct over the left frontal convexity. Moderate chronic microvascular ischemic changes  throughout the white matter. Mild chronic ischemia in the basal ganglia. Cerebellum and brainstem show no evidence of infarction Chronic micro hemorrhage right caudate. No other areas of hemorrhage. Negative for mass or edema. Generalized atrophy.  No hydrocephalus.  Pituitary normal in size. Moderate mucosal edema paranasal sinuses. Mastoid sinus clear. No skull lesion. MRA HEAD FINDINGS Both vertebral arteries patent to the basilar. Left vertebral dominant. Left PICA patent. Right PICA not visualized. Basilar artery patent. Superior cerebellar and posterior cerebral arteries patent without stenosis. Internal carotid artery patent bilaterally without stenosis. Anterior and middle cerebral arteries patent without stenosis. Negative for cerebral aneurysm. IMPRESSION: Small area of acute infarct in the right medial parietal lobe measuring 5 mm. No other area of acute infarct Chronic ischemic changes throughout the white matter. Chronic cortical infarct  over the left frontal convexity. Chronic micro hemorrhage right caudate. Electronically Signed: By: Franchot Gallo M.D. On: 02/21/2016 11:28   Mr Jodene Nam Head/brain Wo Cm  02/21/2016  ADDENDUM REPORT: 02/21/2016 12:38 ADDENDUM: Correction:  Acute infarct is in the LEFT medial parietal lobe. Electronically Signed   By: Franchot Gallo M.D.   On: 02/21/2016 12:38  02/21/2016  CLINICAL DATA:  Left arm weakness, resolved. Diabetes, hypertension, hyperlipidemia, former smoker EXAM: MRI HEAD WITHOUT CONTRAST MRA HEAD WITHOUT CONTRAST TECHNIQUE: Multiplanar, multiecho pulse sequences of the brain and surrounding structures were obtained without intravenous contrast. Angiographic images of the head were obtained using MRA technique without contrast. COMPARISON:  CT head 02/21/2016 FINDINGS: MRI HEAD FINDINGS Small focus of acute infarct in the high left medial parietal lobe. No other area of acute infarct. Chronic infarct over the left frontal convexity. Moderate chronic  microvascular ischemic changes throughout the white matter. Mild chronic ischemia in the basal ganglia. Cerebellum and brainstem show no evidence of infarction Chronic micro hemorrhage right caudate. No other areas of hemorrhage. Negative for mass or edema. Generalized atrophy.  No hydrocephalus.  Pituitary normal in size. Moderate mucosal edema paranasal sinuses. Mastoid sinus clear. No skull lesion. MRA HEAD FINDINGS Both vertebral arteries patent to the basilar. Left vertebral dominant. Left PICA patent. Right PICA not visualized. Basilar artery patent. Superior cerebellar and posterior cerebral arteries patent without stenosis. Internal carotid artery patent bilaterally without stenosis. Anterior and middle cerebral arteries patent without stenosis. Negative for cerebral aneurysm. IMPRESSION: Small area of acute infarct in the right medial parietal lobe measuring 5 mm. No other area of acute infarct Chronic ischemic changes throughout the white matter. Chronic cortical infarct over the left frontal convexity. Chronic micro hemorrhage right caudate. Electronically Signed: By: Franchot Gallo M.D. On: 02/21/2016 11:28    2D Echo: LV EF: 60% - 65% ------------------------------------------------------------------- Indications: TIA 435.9. ------------------------------------------------------------------- History: PMH: Obstructive sleep apnea. Risk factors: Hypertension. Diabetes mellitus. ------------------------------------------------------------------- Study Conclusions  - Left ventricle: The cavity size was normal. Wall thickness was  increased in a pattern of mild LVH. Systolic function was normal.  The estimated ejection fraction was in the range of 60% to 65%.  Wall motion was normal; there were no regional wall motion  abnormalities. Doppler parameters are consistent with abnormal  left ventricular relaxation (grade 1 diastolic dysfunction). The  E/e&' ratio is between  8-15, suggesting indeterminate LV filling  pressure. - Aortic valve: Moderately calcified with restricted leaflet  motion. Moderate to severe stenosis. Mean gradient (S): 17 mm Hg.  Peak gradient (S): 28 mm Hg. Valve area (VTI): 0.93 cm^2. Valve  area (Vmax): 0.99 cm^2. Valve area (Vmean): 0.86 cm^2. - Left atrium: The atrium was mildly dilated at 38 ml/m2. - Right ventricle: The cavity size was normal. Wall thickness was  normal. The moderator band was prominent. Systolic function was  normal. - Right atrium: Moderately dilated at 26 cm2. - Inferior vena cava: The vessel was normal in size. The  respirophasic diameter changes were in the normal range (>= 50%),  consistent with normal central venous pressure.  Impressions:  - LVEF 60-65%, mild LVH, normal wall motion, diastolic dysfunction,  indeterminate LV Filling pressure, moderate to severe aortic  stenosis - mean gradient of 17 mmHg, calculated AVA around 1 cm2.  Mild LAE, moderate RAE, normal IVC.  Transthoracic echocardiography. M-mode, complete 2D, spectral Doppler, and color Doppler. Birthdate: Patient birthdate: 12-Jun-1946. Age: Patient is 70 yr old. Sex: Gender: male. BMI: 39 kg/m^2. Blood pressure:  139/80 Patient status: Inpatient. Study date: Study date: 02/22/2016. Study time: 02:44 PM. Location: Bedside. ------------------------------------------------------------------- Left ventricle: The cavity size was normal. Wall thickness was increased in a pattern of mild LVH. Systolic function was normal. The estimated ejection fraction was in the range of 60% to 65%. Wall motion was normal; there were no regional wall motion abnormalities. Doppler parameters are consistent with abnormal left ventricular relaxation (grade 1 diastolic dysfunction). The E/e&' ratio is between 8-15, suggesting indeterminate LV  filling pressure. ---------------------------------------------------------------- Aortic valve: Moderately calcified with restricted leaflet motion. Moderate to severe stenosis. Doppler: VTI ratio of LVOT to aortic valve: 0.27. Valve area (VTI): 0.93 cm^2. Indexed valve area (VTI): 0.39 cm^2/m^2. Peak velocity ratio of LVOT to aortic valve: 0.29. Valve area (Vmax): 0.99 cm^2. Indexed valve area (Vmax): 0.42 cm^2/m^2. Mean velocity ratio of LVOT to aortic valve: 0.25. Valve area (Vmean): 0.86 cm^2. Indexed valve area (Vmean): 0.36 cm^2/m^2.  Mean gradient (S): 17 mm Hg. Peak gradient (S): 28 mm Hg. ------------------------------------------------------------------- Aorta: Aortic root: The aortic root was normal in size. Ascending aorta: The ascending aorta was normal in size. ------------------------------------------------------------------- Mitral valve: Structurally normal valve. Leaflet separation was normal. Doppler: Transvalvular velocity was within the normal range. There was no evidence for stenosis. There was no regurgitation. Peak gradient (D): 3 mm Hg. ------------------------------------------------------------------- Left atrium: The atrium was mildly dilated at 38 ml/m2. ------------------------------------------------------------------- Atrial septum: Poorly visualized. ------------------------------------------------------------------- Right ventricle: The cavity size was normal. Wall thickness was normal. The moderator band was prominent. Systolic function was normal. ------------------------------------------------------------------- Pulmonic valve: The valve appears to be grossly normal. Doppler: There was trivial regurgitation. ------------------------------------------------------------------- Tricuspid valve: Doppler: There was no significant regurgitation. ------------------------------------------------------------------- Pulmonary  artery: The main pulmonary artery was normal-sized. ------------------------------------------------------------------- Right atrium: Moderately dilated at 26 cm2. ------------------------------------------------------------------- Pericardium: There was no pericardial effusion. ------------------------------------------------------------------- Systemic veins: Inferior vena cava: The vessel was normal in size. The respirophasic diameter changes were in the normal range (>= 50%), consistent with normal central venous pressure.   Admission HPI: Sean Hayden is a 70 year old man with past medical history of hypertension, hyperlipidemia, insulin-dependent Type 2 DM, right carotid endarterectomy, and non-tophaceous gout who presents with left arm numbness.   He reports being in his normal state of health until 12:45 AM last night when he began having left arm numbness, tingling, and weakness while watching a movie. He thought he was having a heart attack and called EMS. He took two baby aspirin and then took another 2 shortly thereafter. He also had left mouth tingling that lasted only 5-10 seconds with associated dysarthria. His left arm symptoms resolved after about 10-15 minutes. He denies blurry vision, double vision, dizziness, headache, chest pain, palpitations, dysphagia, dizziness, confusion, or imbalance. He denies prior history of CVA/TIA. He had right endarterectomy about 3 years ago due to blurry vision. CTA neck on 06/06/15 revealed interval right endarterectomy without residual stenosis, mild left carotid bifurcation plaques, and moderate right vertebral artery stenosis. He has been taking aspirin 81 mg BID for the past year and before that was taking it daily. He is compliant with taking anti-hypertensive, statin, and diabetic medications. He reports having chronic peripheral diabetic neuropathy which he applies capsaicin cream. He used to smoke cigarettes (2 ppd) and cigars for >10 yrs  but quit 3 years ago. He was also a heavy drinker but also quit 3 years ago. He denies recreational drug use. He denies family history of stroke. [facility] is Dr. Caryn Section in [facility].  On admission to the ED he was hypertensive to 167/75. CT head on admission revealed no acute finding but did show chronic small vessel disease and remote left MCA/watershed territory infarct. He was not given tPA due to resolution of symptoms. He currently denies any symptoms and feels back to his normal self.   Hospital Course by problem list: Acute ischemic stroke: Left arm and facial weakness were resolved while patient was observed in the Emergency Department. MRI head demonstrated an acute L medial parietal lobe infarct that does not appropriately explain the patient's symptoms. It is most likely he suffered a transient ischemic attack in a right sided distribution as well causing his symptoms. Based on this he underwent a complete workup for cardioembolic sources of stroke that ultimately returned with moderate but not critical or appropriate for intervention carotid artery stenosis otherwise no significant risk factors. 2D and TEE showed some aortic stenosis but no mural thrombus or atrial appendage clots. On cardiac monitor he remained in normal sinus rhythm throughout the admission. He was already taking aspirin and changed to plavix. A loop recorder was placed on 3/17 to check for paroxysmal arrhythmia due to the radiographic and clinical evidence strongly suggesting that as a possible source.  Discharge Vitals:   BP 131/70 mmHg  Pulse 72  Temp(Src) 98.2 F (36.8 C) (Oral)  Resp 18  Ht 5\' 7"  (1.702 m)  Wt 113.036 kg (249 lb 3.2 oz)  BMI 39.02 kg/m2  SpO2 93%  Discharge Labs:  No results found for this or any previous visit (from the past 24 hour(s)).  Signed: Collier Salina, MD 02/26/2016, 5:59 PM

## 2016-02-24 NOTE — Discharge Instructions (Signed)
Start taking plavix 75mg  daily.   Your metformin should be taken twice a day instead of three times a day. Make sure to follow up with your PCP at the [facility] for a hospital follow up appointment and to manage your diabetes. Your hemoglobin A1c was 8.1 and your goal is less than 7.0.    Loop recorder site care: Do not get the wound wet for 3 days. Do not put any salves, creams, or ointments on the wound If you notice any drainage, bleeding, swelling, redness, or fever please call the office. [phone]

## 2016-02-24 NOTE — Progress Notes (Signed)
Patient ID: Sean Hayden, male   DOB: 08-15-1946, 70 y.o.   MRN: [id number] Medicine attending: I examined this patient this morning together with resident physician Dr. Julious Oka and I concur with her evaluation and management plan which we discussed together. He had a loop recorder placed yesterday. Some minor trauma when he bumped up against the bed. Dressing saturated with blood and will be changed prior to discharge. He has no focal residual neurologic deficits on my exam today. TEE negative for vegetations, PFO, or ASD. Anticipate discharge later today.

## 2016-02-24 NOTE — Progress Notes (Signed)
Discharge orders received, Pt for discharge home today IV d/c'd. D/c instructions and RX given with verbalized understanding. Family at bedside to assist patient with discharge. Staff bought pt downstairs via wheelchair. 02/24/16 1135

## 2016-02-24 NOTE — Progress Notes (Signed)
Subjective: Pt states he slept on his side last night causing his loop recorder site to bleed.   Objective: Vital signs in last 24 hours: Filed Vitals:   02/23/16 1819 02/23/16 2148 02/24/16 0215 02/24/16 0625  BP: 122/73 120/83 128/75 131/70  Pulse: 80 78 71 72  Temp: 98.2 F (36.8 C) 98.2 F (36.8 C) 97.3 F (36.3 C) 98.2 F (36.8 C)  TempSrc: Oral Oral Oral Oral  Resp: 20 18 17 18   Height:      Weight:      SpO2: 97% 95% 95% 93%   Weight change:  No intake or output data in the 24 hours ending 02/24/16 0931 BP 131/70 mmHg  Pulse 72  Temp(Src) 98.2 F (36.8 C) (Oral)  Resp 18  Ht 5\' 7"  (1.702 m)  Wt 249 lb 3.2 oz (113.036 kg)  BMI 39.02 kg/m2  SpO2 93%  General Appearance:    Alert, cooperative, no distress, appears stated age  Head:    Normocephalic, without obvious abnormality, atraumatic  Eyes:    PERRL, conjunctiva/corneas clear, EOM's intact  Lungs:     Clear to auscultation bilaterally, respirations unlabored  Chest Wall:    Loop recorder site dressing soaked w blood, non tender to palpation   Heart:    Regular rate and rhythm, S1 and S2 normal, no murmur, rub   or gallop  Abdomen:     Soft, non-tender, bowel sounds active, no masses, no organomegaly  Extremities:   Extremities normal, atraumatic, no cyanosis or edema  Skin:   Skin color, texture, turgor normal, no rashes or lesions  Neurologic:   CNII-XII intact, normal strength,and reflexes    throughout   Lab Results: Basic Metabolic Panel:  Recent Labs Lab 02/21/16 0304 02/21/16 0315 02/21/16 0828  NA 138 137  --   K 4.5 4.3  --   CL 101 99*  --   CO2 25  --   --   GLUCOSE 159* 153*  --   BUN 13 16  --   CREATININE 0.83 0.70  --   CALCIUM 9.7  --   --   MG  --   --  1.7   Liver Function Tests:  Recent Labs Lab 02/21/16 0304  AST 23  ALT 23  ALKPHOS 70  BILITOT 0.6  PROT 7.2  ALBUMIN 3.9   CBC:  Recent Labs Lab 02/21/16 0304 02/21/16 0315  WBC 7.1  --   NEUTROABS 4.6  --    HGB 15.4 17.0  HCT 45.7 50.0  MCV 90.9  --   PLT 195  --     Recent Labs Lab 02/22/16 1647 02/22/16 2205 02/23/16 0637 02/23/16 1142 02/23/16 2142 02/24/16 0703  GLUCAP 189* 251* 148* 160* 219* 143*   Hemoglobin A1C:  Recent Labs Lab 02/21/16 0828  HGBA1C 8.1*   Fasting Lipid Panel:  Recent Labs Lab 02/21/16 0828  CHOL 113  HDL 34*  LDLCALC 37  TRIG 209*  CHOLHDL 3.3    Recent Labs Lab 02/21/16 0304  LABPROT 13.7  INR 1.03   Anemia Panel:  Recent Labs Lab 02/21/16 0828  VITAMINB12 1181*   Urinalysis:  Recent Labs Lab 02/21/16 0308  COLORURINE STRAW*  LABSPEC 1.025  PHURINE 5.0  GLUCOSEU >1000*  HGBUR NEGATIVE  BILIRUBINUR NEGATIVE  KETONESUR NEGATIVE  PROTEINUR NEGATIVE  NITRITE NEGATIVE  LEUKOCYTESUR NEGATIVE   Micro Results: Recent Results (from the past 240 hour(s))  MRSA PCR Screening     Status: Abnormal  Collection Time: 02/21/16 11:39 PM  Result Value Ref Range Status   MRSA by PCR POSITIVE (A) NEGATIVE Final    Comment:        The GeneXpert MRSA Assay (FDA approved for NASAL specimens only), is one component of a comprehensive MRSA colonization surveillance program. It is not intended to diagnose MRSA infection nor to guide or monitor treatment for MRSA infections. RESULT CALLED TO, READ BACK BY AND VERIFIED WITH: P SWEGER,RN @0328  02/22/16 MKELLY   Medications: I have reviewed the patient's current medications. Scheduled Meds: . atorvastatin  40 mg Oral Daily  . clopidogrel  75 mg Oral Daily  . enoxaparin (LOVENOX) injection  55 mg Subcutaneous Q24H  . insulin aspart  0-15 Units Subcutaneous TID WC  . insulin detemir  50 Units Subcutaneous BID  . loratadine  10 mg Oral Daily   Continuous Infusions:  PRN Meds:.acetaminophen **OR** acetaminophen, senna-docusate Assessment/Plan: Principal Problem:   Left-sided cerebrovascular accident (CVA) ([place]) Active Problems:   Diabetes mellitus with neuropathy ([place])    Hyperlipidemia   Hypertension   History of right-sided carotid endarterectomy   Gout   Allergic rhinitis   B12 deficiency   Acute CVA (cerebrovascular accident) ([place])   Aortic valve stenosis, congenital  Ischemic stroke: Distribution of old and new infarcts on left and right sides is concerning for cardioembolic etiology. TTE was fairly benign without an obvious source. Pt's loop recorder site dressing was soaked in blood, likely from plavix use. Non tender to palpation. Left arm weakness has resolved. -TEE yesterday negative for vegetation, recommend f/u TTE in 1 years to follow AS - loop recorder placed yesterday -Plavix 75mg    Management of other chronic medical problems stable Dispo: D/c today  The patient does have a current PCP (No primary care provider on file.) and does not need an [facility] hospital follow-up appointment after discharge.  The patient does not have transportation limitations that hinder transportation to clinic appointments.  .Services Needed at time of discharge: Y = Yes, Blank = No PT:   OT:   RN:   Equipment:   Other:     LOS: 2 days   Norman Herrlich, MD 02/24/2016, 9:31 AM

## 2016-02-26 ENCOUNTER — Encounter: Payer: Self-pay | Admitting: Family Medicine

## 2016-02-28 ENCOUNTER — Ambulatory Visit (INDEPENDENT_AMBULATORY_CARE_PROVIDER_SITE_OTHER): Payer: Commercial Managed Care - HMO | Admitting: Family Medicine

## 2016-02-28 ENCOUNTER — Encounter: Payer: Self-pay | Admitting: Family Medicine

## 2016-02-28 VITALS — BP 120/60 | HR 76 | Temp 97.8°F | Resp 16 | Ht 67.0 in | Wt 250.0 lb

## 2016-02-28 DIAGNOSIS — E559 Vitamin D deficiency, unspecified: Secondary | ICD-10-CM

## 2016-02-28 DIAGNOSIS — I639 Cerebral infarction, unspecified: Secondary | ICD-10-CM | POA: Diagnosis not present

## 2016-02-28 DIAGNOSIS — E1140 Type 2 diabetes mellitus with diabetic neuropathy, unspecified: Secondary | ICD-10-CM | POA: Diagnosis not present

## 2016-02-28 DIAGNOSIS — Z794 Long term (current) use of insulin: Secondary | ICD-10-CM

## 2016-02-28 DIAGNOSIS — E538 Deficiency of other specified B group vitamins: Secondary | ICD-10-CM

## 2016-02-28 MED ORDER — CANAGLIFLOZIN 300 MG PO TABS: 300.0000 mg | ORAL_TABLET | Freq: Every morning | ORAL | Status: DC

## 2016-02-28 NOTE — Progress Notes (Signed)
Patient: Sean Hayden Male    DOB: 1946/10/08   70 y.o.   MRN: [id number] Visit Date: 02/28/2016  Today's Provider: Lelon Huh, MD   Chief Complaint  Patient presents with  . Follow-up  . Diabetes  . Hypertension   Subjective:    HPI  Follow-up for B-12 deficiency from 12/14/2015; patient advised to stop oral B12 until injectable B12 prescribed by [facility] is complete.  He finished B12 injections about a month ago and has now been on oral supplements.   He was also started on prescription Vitamin D at [facility] for vitamin D deficiency . He is not sure of he dose but states he is taking it once a day and is due to have levels checked.   Patient was admitted to [facility] last week for left sided CVA, however his symptoms were on left upper extremity. It was felt likely to be cardioembolic and he is now on Holter monitor. He has cardiology follow up scheduled next week and he has contact information to schedule follow up with neurologist. He has no neurological sequela of CVA. He was started on Plavix which he tolerating well.    Diabetes Mellitus Type II, Follow-up:   Lab Results  Component Value Date   HGBA1C 8.1* 02/21/2016   HGBA1C 7.3 11/23/2015   HGBA1C 9.6 08/25/2015   Last seen for diabetes 2 months ago.  Management since then includes; no changes, continue current medications. He reports good compliance with treatment. He is not having side effects. none Current symptoms include none and have been low. Home blood sugar records: fasting range: 90  Episodes of hypoglycemia? no   Current Insulin Regimen: 50 units of Levemir qd Most Recent Eye Exam: 6 months ago Weight trend: stable Prior visit with dietician: no Current diet: well balanced  He has not been following diabetic diet very well for the last couple of months, but is taking medications consistently.  Current exercise:  walking  ----------------------------------------------------------------------    Hypertension, follow-up:  BP Readings from Last 3 Encounters:  02/28/16 120/60  02/24/16 131/70  12/14/15 98/60    He was last seen for hypertension 2 months ago.  BP at that visit was 114/58. Management since that visit includes; no changes.He reports good compliance with treatment. He is not having side effects. none  He is exercising. He is adherent to low salt diet.   Outside blood pressures are n/a. He is experiencing none.  Patient denies none.   Cardiovascular risk factors include diabetes mellitus.  Use of agents associated with hypertension: none.   ----------------------------------------------------------------------     Allergies  Allergen Reactions  . Actos  [Pioglitazone]    Previous Medications   ACETAMINOPHEN (TYLENOL) 650 MG CR TABLET    Take 1,300 mg by mouth 2 (two) times daily. Reported on 02/28/2016   ACYCLOVIR (ZOVIRAX) 200 MG CAPSULE    Take by mouth.   ASPIRIN 81 MG TABLET    Take 1 tablet (81 mg total) by mouth 2 (two) times daily.   ATORVASTATIN (LIPITOR) 40 MG TABLET    Take by mouth.   CHOLECALCIFEROL (VITAMIN D PO)    Take 1 tablet by mouth daily.   CLOPIDOGREL (PLAVIX) 75 MG TABLET    Take 1 tablet (75 mg total) by mouth daily.   CYANOCOBALAMIN (VITAMIN B 12 PO)    Take 1 tablet by mouth 2 (two) times daily.   CYANOCOBALAMIN 1000 MCG TABLET    Take 100 mcg  by mouth 2 (two) times daily.   ERGOCALCIFEROL (VITAMIN D2) 50000 UNITS CAPSULE    Take 50,000 Units by mouth once a week.   FLAXSEED, LINSEED, (FLAXSEED OIL) 1000 MG CAPS    Take 1,000 mg by mouth 2 (two) times daily.   FLUTICASONE (FLONASE) 50 MCG/ACT NASAL SPRAY    Place into the nose.   INDOMETHACIN (INDOCIN SR) 75 MG CR CAPSULE    Take by mouth.   INSULIN DETEMIR (LEVEMIR) 100 UNIT/ML INJECTION    Inject 0.65 mLs (65 Units total) into the skin 2 (two) times daily.   LISINOPRIL (PRINIVIL,ZESTRIL) 5 MG  TABLET    Take by mouth.   LORATADINE (CLARITIN) 10 MG TABLET    Take by mouth.   METFORMIN (GLUCOPHAGE) 1000 MG TABLET    Take 1,000 mg by mouth 2 (two) times daily with a meal.    METFORMIN (GLUCOPHAGE) 1000 MG TABLET    Take 1 tablet (1,000 mg total) by mouth 2 (two) times daily with a meal.   INVOKANA 300MG    Take 1 tablet daily   MISC NATURAL PRODUCTS (OSTEO BI-FLEX TRIPLE STRENGTH PO)    Take 1 tablet by mouth 2 (two) times daily.   NEOMYCIN-POLYMYXIN-HYDROCORTISONE (CORTISPORIN) OTIC SOLUTION    Place in ear(s).   OMEGA-3 FATTY ACIDS (FISH OIL) 1200 MG CAPS    Take by mouth.   SPECIALTY VITAMINS PRODUCTS PO    Take by mouth.   TRIAMCINOLONE CREAM (KENALOG) 0.5 %    TRIAMCINOLONE ACETONIDE, 0.5% (External Cream)  1 application to affected area wo times daily, as needed for 0 days  Quantity: 30;  Refills: 1   Ordered :14-Jan-2014  Lelon Huh MD;  Started 14-Jan-2014 Active Comments: Medication taken as needed.    UNABLE TO FIND    Take 2 capsules by mouth daily at 2 am. Med Name: prosvent [id number]   ZOLPIDEM (AMBIEN) 10 MG TABLET    TAKE 1/2 TO 1 TABLET BY MOUTH EVERY NIGHT AT BEDTIME    Review of Systems  Constitutional: Negative for fever, chills and appetite change.  Respiratory: Negative for chest tightness, shortness of breath and wheezing.   Cardiovascular: Negative for chest pain and palpitations.  Gastrointestinal: Negative for nausea, vomiting and abdominal pain.    Social History  Substance Use Topics  . Smoking status: Former Smoker    Types: Cigars    Quit date: 04/08/2012  . Smokeless tobacco: Not on file     Comment: cigarettes and cigars  . Alcohol Use: No     Comment: occassional   Objective:   BP 120/60 mmHg  Pulse 76  Temp(Src) 97.8 F (36.6 C) (Oral)  Resp 16  Ht 5\' 7"  (1.702 m)  Wt 250 lb (113.399 kg)  BMI 39.15 kg/m2  Physical Exam  General Appearance:    Alert, cooperative, no distress, obese  Eyes:    PERRL, conjunctiva/corneas clear, EOM's  intact       Lungs:     Clear to auscultation bilaterally, respirations unlabored  Heart:    Regular rate and rhythm  Neurologic:   Awake, alert, oriented x 3. No apparent focal neurological           defect.           Assessment & Plan:     1. B12 deficiency No on only oral B12 replacment - Vitamin B12  2. Vitamin D deficiency  - VITAMIN D 25 Hydroxy (Vit-D Deficiency, Fractures)  3. Acute CVA (cerebrovascular accident) ([place])  No neurological sequela appreciated. Has follow up scheduled with cardiology next week and he is to call neurology to schedule follow up.   4. Diabetes Recent a1c up to 8.1 He states he can make significant improvements in diet.       Lelon Huh, MD  [facility]

## 2016-02-29 ENCOUNTER — Other Ambulatory Visit: Payer: Self-pay | Admitting: *Deleted

## 2016-02-29 ENCOUNTER — Other Ambulatory Visit: Payer: Self-pay | Admitting: Family Medicine

## 2016-02-29 DIAGNOSIS — E119 Type 2 diabetes mellitus without complications: Secondary | ICD-10-CM

## 2016-02-29 LAB — VITAMIN B12: VITAMIN B 12: 1146 pg/mL — AB (ref 211–946)

## 2016-02-29 LAB — VITAMIN D 25 HYDROXY (VIT D DEFICIENCY, FRACTURES): VIT D 25 HYDROXY: 45.5 ng/mL (ref 30.0–100.0)

## 2016-02-29 MED ORDER — CLOPIDOGREL BISULFATE 75 MG PO TABS: 75.0000 mg | ORAL_TABLET | Freq: Every day | ORAL | Status: DC

## 2016-02-29 NOTE — Patient Outreach (Addendum)
[facility]) Care Management  02/29/2016  CHERIE KHERA 25-Mar-1946 [id number]  Subjective: Telephone call to patient's home number, spoke with patient, and HIPAA verified.   Discussed [facility] EMMI Stroke and [facility] Care Management program, patient in agreement to receiving services.   Patient states he is doing better and was not feeling well on 02/27/16 when he received the EMMI Stroke call.   Patient gave [facility] verbal authorization to speak with wife Chevy Ubaldo) and sister Kandis Cocking) regarding his healthcare needs as needed. Patient states he also receives care and gets all of his meds at the [facility].  Patient states he also assist his wife, who had hip surgery 3 weeks ago.  States he and his wife take care of each other as needed.   Patient states he saw his primary MD Dr. Lelon Huh on 02/28/16.   Patient states he has a history of diabetes, hypertension, and hyperlipidemia.  Patient states he was also recently discharged from the hospital due to a stroke.  Patient in agreement to [place] referral for transition of care due to recent hospitalization, diabetes management, diabetes education and community resources.    Objective: Per Epic case review:  Patient hospitalized 02/21/16 -02/24/16 for CVA.    Assessment: Received EMMI Stroke red flag referral on 02/29/16.   Report date: 02/27/16.    Red flag trigger: Patient answered yes to feeling worse overall.   Patient has no Telephonic RNCM needs at this time and will be followed by [facility].   Plan: RNCM will send referral to [facility] for transition of care due to recent hospitalization, diabetes management, diabetes education and community resources.    [place] Petion H. Annia Friendly, BSN, [place] Management [facility] Telephonic CM Phone: [phone] Fax: [phone]

## 2016-03-01 NOTE — Telephone Encounter (Signed)
Please call in zolpidem  

## 2016-03-01 NOTE — Telephone Encounter (Signed)
Rx called in to pharmacy. 

## 2016-03-05 NOTE — Progress Notes (Signed)
This encounter was created in error - please disregard.

## 2016-03-06 ENCOUNTER — Ambulatory Visit (INDEPENDENT_AMBULATORY_CARE_PROVIDER_SITE_OTHER): Payer: Commercial Managed Care - HMO | Admitting: *Deleted

## 2016-03-06 ENCOUNTER — Encounter: Payer: Self-pay | Admitting: Internal Medicine

## 2016-03-06 DIAGNOSIS — I639 Cerebral infarction, unspecified: Secondary | ICD-10-CM

## 2016-03-06 LAB — CUP PACEART INCLINIC DEVICE CHECK: Date Time Interrogation Session: 20170329154445

## 2016-03-06 NOTE — Progress Notes (Signed)
Wound check in clinic s/p ILR implant. Wound well healed without redness or edema. Incision edges approximated. Normal device function. Battery status: GOOD. R-waves 0.67mV. 0 symptom episodes, 0 tachy episodes, 0 pause episodes, 0 brady episodes. 0 AF episodes (0% burden). Monthly summary reports and ROV with JA prn.

## 2016-03-08 ENCOUNTER — Other Ambulatory Visit: Payer: Self-pay | Admitting: *Deleted

## 2016-03-08 NOTE — Patient Outreach (Addendum)
First attempt made to contact pt, f/u on referral from [place] CM (follow pt for transition of care).   HIPPA compliant voice message left with contact number, if no response will try again.   Zara Chess.   [place] Care Management  [phone]

## 2016-03-11 ENCOUNTER — Other Ambulatory Visit: Payer: Self-pay | Admitting: *Deleted

## 2016-03-11 NOTE — Patient Outreach (Signed)
[facility]) Care Management  03/11/2016  Sean Hayden Jan 08, 1946 [id number]   Patient triggered RED on EMMI Stroke dashboard, notification sent to Kathie Rhodes, RN.  Thanks, Ronnell Freshwater. Altha, [place] Assistant Phone: [phone] Fax: [phone]

## 2016-03-11 NOTE — Patient Outreach (Signed)
Attempt made to f/u with pt, received [facility] red flag referral today 4/3 (Emmi stroke dashboard), red flag trigger pt did not go to f/u appointment/pt did not schedule a f/u appointment.  HIPPA compliant voice message left with contact number, if no response, will try again.      Zara Chess.   [place] Care Management  [phone]

## 2016-03-12 ENCOUNTER — Other Ambulatory Visit: Payer: Self-pay | Admitting: *Deleted

## 2016-03-12 NOTE — Patient Outreach (Signed)
Pt called, HIPPA verified.  RN CM discussed with pt EMMI Stroke call received yesterday for 4/2- (did not go to f/u appointment, did not schedule a f/u appointment).  Pt reports on recent hospital discharge (3/15-3/18), f/u with Heart MD 3/22 about his holter  monitor, told was okay.  Pt reports f/u with family MD 3/28.   Pt reports compliant with all medications, can afford- get them  all at the [place].    Pt reports spouse was recently discharged from the hospital, have not been answering phone calls as have been busy with spouse.    Pt reports he does not need home health services.   RN CM discussed [facility] transition of care program, following with weekly calls/home visit.  Pt reports cannot discuss doing home visit now- pt in a  hurry to get off the phone, requested RN CM  call back.    As agreed, RN CM to call again 4/11.     Plan to f/u again 4/11- ongoing transition of care, discuss scheduling home visit.    Zara Chess.   [place] Care Management  [phone]

## 2016-03-14 ENCOUNTER — Encounter: Payer: Self-pay | Admitting: Internal Medicine

## 2016-03-15 ENCOUNTER — Telehealth: Payer: Self-pay | Admitting: *Deleted

## 2016-03-15 NOTE — Telephone Encounter (Signed)
Called patient's home number to discuss tachy episode from 03/13/16.  Patient's wife states that he has been doing well lately and she does not think he was symptomatic.  She states that she has been ill recently and was just discharged from [place] on Monday so the patient may have been worried about her.  She is aware to call if the patient has any questions or concerns regarding his loop recorder.  She is appreciative of call.

## 2016-03-17 ENCOUNTER — Encounter: Payer: Self-pay | Admitting: Internal Medicine

## 2016-03-19 ENCOUNTER — Other Ambulatory Visit: Payer: Self-pay | Admitting: *Deleted

## 2016-03-19 ENCOUNTER — Ambulatory Visit: Payer: Self-pay | Admitting: *Deleted

## 2016-03-19 NOTE — Patient Outreach (Signed)
Transition of care call:  Spoke with pt, HIPPA verified.  Pt reports has to go, wife (at [facility]) to have surgery right now.   RN CM to call pt back at a later time.     Zara Chess.   [place] Care Management  [phone]

## 2016-03-25 ENCOUNTER — Ambulatory Visit (INDEPENDENT_AMBULATORY_CARE_PROVIDER_SITE_OTHER): Payer: Commercial Managed Care - HMO | Admitting: *Deleted

## 2016-03-25 ENCOUNTER — Other Ambulatory Visit: Payer: Self-pay | Admitting: *Deleted

## 2016-03-25 DIAGNOSIS — I639 Cerebral infarction, unspecified: Secondary | ICD-10-CM

## 2016-03-25 NOTE — Progress Notes (Signed)
Carelink Summary Report / Loop Recorder 

## 2016-03-25 NOTE — Patient Outreach (Signed)
Attempt made to contact pt as part of ongoing transition of care (referral received from [facility] telephonic RN CM, pt discharged 3/17).  Last conversation with pt 4/11- RN CM told to call back, unable to talk at that time.   HIPPA compliant voice message left with contact number.  If no response, will call again.     Zara Chess.   [place] Care Management  [phone]

## 2016-03-26 ENCOUNTER — Telehealth: Payer: Self-pay | Admitting: *Deleted

## 2016-03-26 DIAGNOSIS — I480 Paroxysmal atrial fibrillation: Secondary | ICD-10-CM

## 2016-03-26 MED ORDER — RIVAROXABAN 20 MG PO TABS: 20.0000 mg | ORAL_TABLET | Freq: Every day | ORAL | Status: DC

## 2016-03-26 NOTE — Telephone Encounter (Signed)
Called patient to advise that Dr. Rayann Heman has diagnosed new atrial fibrillation on patient's ILR.  Advised patient that Dr. Jackalyn Lombard instructions advise to START Xarelto 20mg  daily with supper, and STOP aspirin and Plavix.  Patient verbalizes understanding of instructions and repeated them back correctly.  Offered to call the [place] office to see if they have any Xarelto samples, but patient declines.  Advised patient that we will schedule him for an appointment with the AF Clinic to establish.  Patient verbalizes understanding.  Confirmed patient's pharmacy to send prescription electronically, but then he remembered that he has a [place] appointment on Thursday.  Patient requests that the prescription be written for 10 doses so that he can find out from the [place] if his Xarelto will be covered.  Advised patient that he could also pick up some samples to cover him until his [place] appointment.  Patient states his granddaughter will be there in about 15 minutes so he will call me back after he can talk to her about a plan.

## 2016-03-26 NOTE — Telephone Encounter (Signed)
Patient's granddaughter, Sharyn Lull, called to discuss plan.  She is agreeable to AF clinic appointment on Thursday, 03/28/16 at 3:00pm.  Xarelto prescription sent electronically to patient's preferred pharmacy per patient and Michelle's request.  She is aware of instructions to stop aspirin and Plavix.  Sharyn Lull verbalizes understanding of all instructions and denies additional questions or concerns at this time.  She is aware to call if any questions arise.

## 2016-03-27 NOTE — Telephone Encounter (Signed)
Called patient to give him directions to AF clinic.  Gave parking code.  He denies additional questions or concerns at this time.

## 2016-03-28 ENCOUNTER — Ambulatory Visit (HOSPITAL_COMMUNITY)
Admission: RE | Admit: 2016-03-28 | Discharge: 2016-03-28 | Disposition: A | Discharge status: Home / Self Care | Payer: Commercial Managed Care - HMO | Source: Ambulatory Visit | Attending: Nurse Practitioner | Admitting: Nurse Practitioner

## 2016-03-28 ENCOUNTER — Telehealth: Payer: Self-pay

## 2016-03-28 ENCOUNTER — Encounter (HOSPITAL_COMMUNITY): Payer: Self-pay | Admitting: Nurse Practitioner

## 2016-03-28 VITALS — BP 122/82 | HR 84 | Ht 67.0 in | Wt 242.2 lb

## 2016-03-28 DIAGNOSIS — E1140 Type 2 diabetes mellitus with diabetic neuropathy, unspecified: Secondary | ICD-10-CM | POA: Insufficient documentation

## 2016-03-28 DIAGNOSIS — E538 Deficiency of other specified B group vitamins: Secondary | ICD-10-CM | POA: Diagnosis not present

## 2016-03-28 DIAGNOSIS — Z888 Allergy status to other drugs, medicaments and biological substances status: Secondary | ICD-10-CM | POA: Insufficient documentation

## 2016-03-28 DIAGNOSIS — Z7901 Long term (current) use of anticoagulants: Secondary | ICD-10-CM | POA: Insufficient documentation

## 2016-03-28 DIAGNOSIS — G4733 Obstructive sleep apnea (adult) (pediatric): Secondary | ICD-10-CM | POA: Diagnosis not present

## 2016-03-28 DIAGNOSIS — Z8673 Personal history of transient ischemic attack (TIA), and cerebral infarction without residual deficits: Secondary | ICD-10-CM | POA: Insufficient documentation

## 2016-03-28 DIAGNOSIS — M109 Gout, unspecified: Secondary | ICD-10-CM | POA: Diagnosis not present

## 2016-03-28 DIAGNOSIS — I4891 Unspecified atrial fibrillation: Secondary | ICD-10-CM | POA: Diagnosis present

## 2016-03-28 DIAGNOSIS — Z95818 Presence of other cardiac implants and grafts: Secondary | ICD-10-CM | POA: Diagnosis not present

## 2016-03-28 DIAGNOSIS — I480 Paroxysmal atrial fibrillation: Secondary | ICD-10-CM | POA: Insufficient documentation

## 2016-03-28 DIAGNOSIS — Z79899 Other long term (current) drug therapy: Secondary | ICD-10-CM | POA: Insufficient documentation

## 2016-03-28 DIAGNOSIS — I10 Essential (primary) hypertension: Secondary | ICD-10-CM | POA: Diagnosis not present

## 2016-03-28 DIAGNOSIS — I639 Cerebral infarction, unspecified: Secondary | ICD-10-CM

## 2016-03-28 DIAGNOSIS — Z794 Long term (current) use of insulin: Secondary | ICD-10-CM | POA: Insufficient documentation

## 2016-03-28 DIAGNOSIS — Z87891 Personal history of nicotine dependence: Secondary | ICD-10-CM | POA: Diagnosis not present

## 2016-03-28 MED ORDER — RIVAROXABAN 20 MG PO TABS: 20.0000 mg | ORAL_TABLET | Freq: Every day | ORAL | Status: DC

## 2016-03-28 NOTE — Telephone Encounter (Signed)
Please refer cardiology for follow up thromboembolic stroke.

## 2016-03-28 NOTE — Telephone Encounter (Signed)
Please advise 

## 2016-03-28 NOTE — Telephone Encounter (Signed)
Erline Levine called stating pt has an appointment this afternoon with cardiology.  They need to have a referral.  Once it is done please fax the information to [facility] at [phone].  Thanks,   -Mickel Baas

## 2016-03-29 ENCOUNTER — Encounter (HOSPITAL_COMMUNITY): Payer: Self-pay | Admitting: Nurse Practitioner

## 2016-03-29 NOTE — Progress Notes (Signed)
Patient ID: Sean Hayden, male   DOB: 11-28-1946, 70 y.o.   MRN: [id number]     Primary Care Physician: Lelon Huh, MD Referring Physician: Dr. Rayann Heman Neurologist: Dr. Erlinda Hong   Sean Hayden is a 70 y.o. male with a h/o DM, HTN, admission on 3/15 with L CVA discharged on plavix and asa and was implanted with a Linq recorder. It recently showed afib, nonsustained, and pt was started on xarelto a few days ago by Dr. Rayann Heman. Plavix and asa were stopped at that time, that was started at time of CVA.[name] He is being seen in the afib clinic for further education re blood thinners. He is taking 20 mg which is appropriate with his creat cl calculated at 128mg /dl. He has no residual neurological sequela.He has no awareness of afib.   He does not drink, no smoking, no excessive caffeine use. Is obese and inactive. No bleeding history. Is steady on his feet w/o h/o of any significant falls. He is taking xarelto correctly at supper time. He does not use NSAIDS, tylenol if he has discomfort. Chadsvasc score is 5.  Today, he denies symptoms of palpitations, chest pain, shortness of breath, orthopnea, PND, lower extremity edema, dizziness, presyncope, syncope, or neurologic sequela. The patient is tolerating medications without difficulties and is otherwise without complaint today.   Past Medical History  Diagnosis Date  . Kidney stones   . Hyperplastic colon polyp   . Gouty arthropathy   . Personal history of urinary calculi 05/25/2015  . Diabetes mellitus without complication ([place])   . Hypertension   . Gout   . MRSA infection   . Neuropathy ([place])   . History of right-sided carotid endarterectomy 04/17/12  . B12 deficiency   . OSA (obstructive sleep apnea)   . Diverticulosis   . Hemorrhoids   . Nephrolithiasis    Past Surgical History  Procedure Laterality Date  . Carotid endarterectomy Right 04/17/2012    Dr. Delana Meyer, [facility]  . Cholecystectomy  2005    Lap  . Carotid artery angioplasty Right  04/17/2012    Dr. Delana Meyer, [facility]  . Hemorrhoid surgery  2009  . Cholecystectomy  2005    lap  . Tee without cardioversion N/A 02/23/2016    Procedure: TRANSESOPHAGEAL ECHOCARDIOGRAM (TEE);  Surgeon: Pixie Casino, MD;  Location: [facility] ENDOSCOPY;  Service: Cardiovascular;  Laterality: N/A;  ALSO GETTING A LOOP  . Ep implantable device N/A 02/23/2016    Procedure: Loop Recorder Insertion;  Surgeon: Thompson Grayer, MD;  Location: [facility] LAB;  Service: Cardiovascular;  Laterality: N/A;    Current Outpatient Prescriptions  Medication Sig Dispense Refill  . acetaminophen (TYLENOL) 650 MG CR tablet Take 1,300 mg by mouth 2 (two) times daily. Reported on 03/06/2016    . acyclovir (ZOVIRAX) 200 MG capsule Take by mouth as needed.     [name] atorvastatin (LIPITOR) 40 MG tablet Take 20 mg by mouth.     . canagliflozin (INVOKANA) 300 MG TABS tablet Take 1 tablet (300 mg total) by mouth every morning. 15 tablet 0  . Cholecalciferol (VITAMIN D PO) Take 1 tablet by mouth daily.    . Cyanocobalamin (VITAMIN B 12 PO) Take 1 tablet by mouth 2 (two) times daily.    [name] doxycycline (VIBRA-TABS) 100 MG tablet Take 100 mg by mouth 2 (two) times daily.    . Flaxseed, Linseed, (FLAXSEED OIL) 1000 MG CAPS Take 1,000 mg by mouth 2 (two) times daily.    . fluticasone (FLONASE)  50 MCG/ACT nasal spray Place into the nose.    . gabapentin (NEURONTIN) 100 MG capsule Take 100 mg by mouth 3 (three) times daily.    . indomethacin (INDOCIN SR) 75 MG CR capsule Take by mouth as needed.     . insulin detemir (LEVEMIR) 100 UNIT/ML injection Inject 0.65 mLs (65 Units total) into the skin 2 (two) times daily. 1 mL 0  . lisinopril (PRINIVIL,ZESTRIL) 5 MG tablet Take 5 mg by mouth daily.     [name] loratadine (CLARITIN) 10 MG tablet Take by mouth daily.     . Menthol-Methyl Salicylate ([place] EX) Apply topically as needed.    . metFORMIN (GLUCOPHAGE) 1000 MG tablet Take 1 tablet (1,000 mg total) by mouth 2 (two) times daily with a  meal. (Patient taking differently: Take 1,000 mg by mouth 3 (three) times daily. ) 60 tablet 0  . Misc Natural Products (OSTEO BI-FLEX TRIPLE STRENGTH PO) Take 1 tablet by mouth 2 (two) times daily.    [name] neomycin-polymyxin-hydrocortisone (CORTISPORIN) otic solution Place in ear(s).    . Omega-3 Fatty Acids (FISH OIL) 1200 MG CAPS Take by mouth.    . rivaroxaban (XARELTO) 20 MG TABS tablet Take 1 tablet (20 mg total) by mouth daily with supper. 30 tablet 6  . SPECIALTY VITAMINS PRODUCTS PO Take by mouth. Reported on 03/06/2016    . triamcinolone cream (KENALOG) 0.5 % TRIAMCINOLONE ACETONIDE, 0.5% (External Cream)  1 application to affected area wo times daily, as needed for 0 days  Quantity: 30;  Refills: 1   Ordered :14-Jan-2014  Lelon Huh MD;  Started 14-Jan-2014 Active Comments: Medication taken as needed.     [name] UNABLE TO FIND Take 2 capsules by mouth daily at 2 am. Med Name: prosvent 355    . zolpidem (AMBIEN) 10 MG tablet TAKE 1/2 TO 1 TABLET AT BEDTIME 30 tablet 5  . capsaicin (ZOSTRIX) 0.025 % cream Apply topically as needed.     No current facility-administered medications for this encounter.    Allergies  Allergen Reactions  . Actos  [Pioglitazone]     Social History   Social History  . Marital Status: Married    Spouse Name: N/A  . Number of Children: N/A  . Years of Education: HS Grad   Occupational History  . Retired    Social History Main Topics  . Smoking status: Former Smoker    Types: Cigars    Quit date: 04/08/2012  . Smokeless tobacco: Not on file     Comment: cigarettes and cigars  . Alcohol Use: No     Comment: occassional  . Drug Use: No  . Sexual Activity: Not on file   Other Topics Concern  . Not on file   Social History Narrative   ** Merged History Encounter **        Family History  Problem Relation Age of Onset  . Hodgkin's lymphoma Father     ROS- All systems are reviewed and negative except as per the HPI above  Physical  Exam: Filed Vitals:   03/28/16 1520  BP: 122/82  Pulse: 84  Height: 5\' 7"  (1.702 m)  Weight: 242 lb 3.2 oz (109.861 kg)    GEN- The patient is well appearing, alert and oriented x 3 today.   Head- normocephalic, atraumatic Eyes-  Sclera clear, conjunctiva pink Ears- hearing intact Oropharynx- clear Neck- supple, no JVP Lymph- no cervical lymphadenopathy Lungs- Clear to ausculation bilaterally, normal work of breathing Heart- Regular rate and  rhythm, 2/6 systolic murmur, rubs or gallops, PMI not laterally displaced GI- soft, NT, ND, + BS Extremities- no clubbing, cyanosis, or edema MS- no significant deformity or atrophy Skin- no rash or lesion Psych- euthymic mood, full affect Neuro- strength and sensation are intact  EKG- NSR, normal EKG Epic records reviewed  Assessment and Plan: 1. Recent CVA with implantable loop showing PAF Off plavix and asa On xarelto 20 mg daily No bleeding risks, bleeding precautions discussed Unaware of PAF and does not require further treatment at this time Will continue surveillance of afib burden thru remote device checks  F/u in one month for f/u tolerance of blood thinners  Sean Hayden, [facility] [address] [place], [place] [address] [phone]

## 2016-04-01 ENCOUNTER — Other Ambulatory Visit: Payer: Self-pay | Admitting: *Deleted

## 2016-04-01 DIAGNOSIS — L02521 Furuncle right hand: Secondary | ICD-10-CM | POA: Diagnosis not present

## 2016-04-01 DIAGNOSIS — X32XXXA Exposure to sunlight, initial encounter: Secondary | ICD-10-CM | POA: Diagnosis not present

## 2016-04-01 DIAGNOSIS — L570 Actinic keratosis: Secondary | ICD-10-CM | POA: Diagnosis not present

## 2016-04-01 DIAGNOSIS — Z85828 Personal history of other malignant neoplasm of skin: Secondary | ICD-10-CM | POA: Diagnosis not present

## 2016-04-01 DIAGNOSIS — L02522 Furuncle left hand: Secondary | ICD-10-CM | POA: Diagnosis not present

## 2016-04-01 DIAGNOSIS — L408 Other psoriasis: Secondary | ICD-10-CM | POA: Diagnosis not present

## 2016-04-01 NOTE — Patient Outreach (Signed)
Second attempt made to contact pt after told by pt to call back on 4/11.   HIPPA compliant voice message left with contact number.  If no response, will try a third time.      Zara Chess.   [facility]  [phone]

## 2016-04-05 ENCOUNTER — Other Ambulatory Visit: Payer: Self-pay | Admitting: *Deleted

## 2016-04-05 ENCOUNTER — Ambulatory Visit: Payer: Self-pay | Admitting: *Deleted

## 2016-04-05 NOTE — Patient Outreach (Signed)
Received a return phone call from pt to voice message left earlier today (third attempt).   Pt states spouse in rehab, not doing good, cannot talk now, phone disconnected.       RN CM to f/u again next week.     Zara Chess.   [facility]  [phone]

## 2016-04-05 NOTE — Patient Outreach (Addendum)
Third attempt made to contact pt after talking with him on 4/11- told to call back.  This RN CM has been unsuccessful in f/u referral from [place] CM.   If no response, will send pt an unable to contact letter and if no response to letter in 10 days, will close case.     Zara Chess.   [facility]  [phone]

## 2016-04-10 ENCOUNTER — Encounter: Payer: Self-pay | Admitting: Family Medicine

## 2016-04-10 ENCOUNTER — Ambulatory Visit (INDEPENDENT_AMBULATORY_CARE_PROVIDER_SITE_OTHER): Payer: 59 | Admitting: Family Medicine

## 2016-04-10 VITALS — BP 100/72 | HR 76 | Temp 98.4°F | Resp 16 | Wt 239.4 lb

## 2016-04-10 DIAGNOSIS — H6092 Unspecified otitis externa, left ear: Secondary | ICD-10-CM

## 2016-04-10 MED ORDER — NEOMYCIN-POLYMYXIN-HC 3.5-10000-1 OT SOLN: 4.0000 [drp] | Freq: Four times a day (QID) | OTIC | Status: DC

## 2016-04-10 NOTE — Progress Notes (Signed)
Subjective:     Patient ID: Sean Hayden, male   DOB: [birth year], 70 y.o.   MRN: [id number]  HPI  Chief Complaint  Patient presents with  . Ear Pain    Patient comes in office today with complaints of left ear pain for the past 3 days. Patient reports placing drops in his ears, he states he has a history of ear impaction.   Also reports prior history of "swimmer's ear" for which he has been prescribed drops by the V.A[facility] our office, and Dr. Tami Ribas, ENT. He can not recall the name. Taking Claritin daily to control allergies. Denies acute changes in hearing.   Review of Systems     Objective:   Physical Exam  Constitutional: He appears well-developed and well-nourished. No distress.  HENT:  Right Ear: Tympanic membrane normal.  Left ear with mild tragal tenderness, ear canal scaling with mild erythema. Canal is patent with dull T.M.       Assessment:    1. External otitis of left ear - neomycin-polymyxin-hydrocortisone (CORTISPORIN) otic solution; Place 4 drops into the left ear 4 (four) times daily.  Dispense: 10 mL; Refill: 1    Plan:    He will call if not improving and give [place] the names of the other ear drops he has been using.

## 2016-04-10 NOTE — Patient Instructions (Signed)
Let us know if ear drops not helping. Check and see what other drops you have used and let us know.

## 2016-04-12 ENCOUNTER — Ambulatory Visit: Payer: Self-pay | Admitting: Neurology

## 2016-04-12 ENCOUNTER — Other Ambulatory Visit: Payer: Self-pay | Admitting: *Deleted

## 2016-04-12 NOTE — Patient Outreach (Signed)
Another attempt made to contact pt, f/u on referral from [place] [facility].   This RN CM spoke with pt 3 times briefly, had to go - concern over spouse's health, plus several attempts made in between.   If no response to today's call, plan to send unable to contact letter, no response to letter, plan to close case.    Zara Chess.   [facility]  [phone]

## 2016-04-15 ENCOUNTER — Encounter: Payer: Self-pay | Admitting: *Deleted

## 2016-04-16 ENCOUNTER — Encounter: Payer: Self-pay | Admitting: Family Medicine

## 2016-04-16 ENCOUNTER — Ambulatory Visit (INDEPENDENT_AMBULATORY_CARE_PROVIDER_SITE_OTHER): Payer: 59 | Admitting: Family Medicine

## 2016-04-16 VITALS — BP 118/58 | HR 90 | Temp 98.1°F | Resp 16 | Wt 236.0 lb

## 2016-04-16 DIAGNOSIS — Z658 Other specified problems related to psychosocial circumstances: Secondary | ICD-10-CM | POA: Diagnosis not present

## 2016-04-16 DIAGNOSIS — F439 Reaction to severe stress, unspecified: Secondary | ICD-10-CM | POA: Insufficient documentation

## 2016-04-16 MED ORDER — PAROXETINE HCL 20 MG PO TABS: ORAL_TABLET | ORAL | Status: DC

## 2016-04-16 NOTE — Progress Notes (Signed)
Patient: Sean Hayden Male    DOB: 1946/03/05   70 y.o.   MRN: [id number] Visit Date: 04/16/2016  Today's Provider: Lelon Huh, MD   Chief Complaint  Patient presents with  . Anxiety   Subjective:    HPI  Patient has been having anxiety, nervousness, and stress for the last few weeks. Associates symptoms with wife's health. Apparently she had hip surgery in March and has had multiple complications. Patient has had an poor appetite and has lost weight. Having a hard time sleeping. Anxiety has been getting progressively worse.    Allergies  Allergen Reactions  . Actos  [Pioglitazone]    Previous Medications   ACYCLOVIR (ZOVIRAX) 200 MG CAPSULE    Take by mouth as needed. Reported on 04/10/2016   ATORVASTATIN (LIPITOR) 40 MG TABLET    Take 20 mg by mouth.    CANAGLIFLOZIN (INVOKANA) 300 MG TABS TABLET    Take 1 tablet (300 mg total) by mouth every morning.   CAPSAICIN (ZOSTRIX) 0.025 % CREAM    Apply topically as needed.   CHOLECALCIFEROL (VITAMIN D PO)    Take 1 tablet by mouth daily.   CYANOCOBALAMIN (VITAMIN B 12 PO)    Take 1 tablet by mouth 2 (two) times daily.   FLAXSEED, LINSEED, (FLAXSEED OIL) 1000 MG CAPS    Take 1,000 mg by mouth 2 (two) times daily.   FLUTICASONE (FLONASE) 50 MCG/ACT NASAL SPRAY    Place into the nose.   GABAPENTIN (NEURONTIN) 100 MG CAPSULE    Take 100 mg by mouth 3 (three) times daily.   INDOMETHACIN (INDOCIN SR) 75 MG CR CAPSULE    Take by mouth as needed.    INSULIN DETEMIR (LEVEMIR) 100 UNIT/ML INJECTION    Inject 0.65 mLs (65 Units total) into the skin 2 (two) times daily.   LISINOPRIL (PRINIVIL,ZESTRIL) 5 MG TABLET    Take 5 mg by mouth daily.    LORATADINE (CLARITIN) 10 MG TABLET    Take by mouth daily.    MENTHOL-METHYL SALICYLATE (THERA-GESIC EX)    Apply topically as needed.   METFORMIN (GLUCOPHAGE) 1000 MG TABLET    Take 1 tablet (1,000 mg total) by mouth 2 (two) times daily with a meal.   MISC NATURAL PRODUCTS (OSTEO BI-FLEX  TRIPLE STRENGTH PO)    Take 1 tablet by mouth 2 (two) times daily.   NEOMYCIN-POLYMYXIN-HYDROCORTISONE (CORTISPORIN) OTIC SOLUTION    Place 4 drops into the left ear 4 (four) times daily.   OMEGA-3 FATTY ACIDS (FISH OIL) 1200 MG CAPS    Take by mouth.   RIVAROXABAN (XARELTO) 20 MG TABS TABLET    Take 1 tablet (20 mg total) by mouth daily with supper.   SPECIALTY VITAMINS PRODUCTS PO    Take by mouth. Reported on 03/06/2016   TRIAMCINOLONE CREAM (KENALOG) 0.5 %    TRIAMCINOLONE ACETONIDE, 0.5% (External Cream)  1 application to affected area wo times daily, as needed for 0 days  Quantity: 30;  Refills: 1   Ordered :14-Jan-2014  Lelon Huh MD;  Started 14-Jan-2014 Active Comments: Medication taken as needed.    UNABLE TO FIND    Take 2 capsules by mouth daily at 2 am. Med Name: prosvent 355   ZOLPIDEM (AMBIEN) 10 MG TABLET    TAKE 1/2 TO 1 TABLET AT BEDTIME    Review of Systems  Constitutional: Positive for appetite change. Negative for fever and chills.  Respiratory: Negative for chest tightness, shortness  of breath and wheezing.   Cardiovascular: Negative for chest pain and palpitations.  Gastrointestinal: Negative for nausea, vomiting and abdominal pain.  Psychiatric/Behavioral: The patient is nervous/anxious.     Social History  Substance Use Topics  . Smoking status: Former Smoker    Types: Cigars    Quit date: 04/08/2012  . Smokeless tobacco: Not on file     Comment: cigarettes and cigars  . Alcohol Use: No     Comment: occassional   Objective:   BP 118/58 mmHg  Pulse 90  Temp(Src) 98.1 F (36.7 C) (Oral)  Resp 16  Wt 236 lb (107.049 kg)  SpO2 97%  Physical Exam  General appearance: alert, well developed, well nourished, cooperative and in no distress Head: Normocephalic, without obvious abnormality, atraumatic Lungs: Respirations even and unlabored Extremities: No gross deformities Skin: Skin color, texture, turgor normal. No rashes seen  Psych: Appropriate  mood and affect. Neurologic: Mental status: Alert, oriented to person, place, and time, thought content appropriate.     Assessment & Plan:     1. Situational stress  - PARoxetine (PAXIL) 20 MG tablet; 1/2 tablet every evening for 4 days, then increase to 1 tablet daily  Dispense: 30 tablet; Refill: 1  Return in about 3 weeks (around 05/07/2016).       Lelon Huh, MD  [facility]

## 2016-04-23 ENCOUNTER — Ambulatory Visit (INDEPENDENT_AMBULATORY_CARE_PROVIDER_SITE_OTHER): Payer: Commercial Managed Care - HMO | Admitting: *Deleted

## 2016-04-23 DIAGNOSIS — I639 Cerebral infarction, unspecified: Secondary | ICD-10-CM

## 2016-04-24 NOTE — Progress Notes (Signed)
Carelink Summary Report / Loop Recorder 

## 2016-04-25 ENCOUNTER — Ambulatory Visit (HOSPITAL_COMMUNITY): Payer: Commercial Managed Care - HMO | Admitting: Nurse Practitioner

## 2016-04-29 ENCOUNTER — Encounter: Payer: Self-pay | Admitting: *Deleted

## 2016-05-03 ENCOUNTER — Ambulatory Visit (INDEPENDENT_AMBULATORY_CARE_PROVIDER_SITE_OTHER): Payer: 59 | Admitting: Family Medicine

## 2016-05-03 ENCOUNTER — Encounter: Payer: Self-pay | Admitting: Family Medicine

## 2016-05-03 VITALS — BP 128/68 | HR 76 | Temp 97.6°F | Resp 16 | Wt 233.0 lb

## 2016-05-03 DIAGNOSIS — L02234 Carbuncle of groin: Secondary | ICD-10-CM

## 2016-05-03 DIAGNOSIS — Z8614 Personal history of Methicillin resistant Staphylococcus aureus infection: Secondary | ICD-10-CM

## 2016-05-03 MED ORDER — DOXYCYCLINE HYCLATE 100 MG PO TABS: 100.0000 mg | ORAL_TABLET | Freq: Two times a day (BID) | ORAL | Status: DC

## 2016-05-03 NOTE — Patient Instructions (Signed)
Cleanse boil with soap and water and apply bandaid. Do f/u with Dr. Caryn Section as scheduled.

## 2016-05-03 NOTE — Progress Notes (Signed)
Subjective:     Patient ID: MAKHARI POSTIGLIONE, male   DOB: [birth year], 70 y.o.   MRN: [id number]  HPI  Chief Complaint  Patient presents with  . Abscess    right groin area. Has drained, is now red.  States he has hx of MRSA and generally will puncture and drain any early boils he gets on his extremities. Current one started approximately 8 days ago. States he has seen blood but little pus.   Review of Systems     Objective:   Physical Exam  Constitutional: He appears well-developed and well-nourished. No distress.  Skin:  Oval 3 cm erythematous lesion c/w carbuncle in in his right groin fold. No underlying induration or drainage.       Assessment:    1. Hx MRSA infection  2. Carbuncle, groin - doxycycline (VIBRA-TABS) 100 MG tablet; Take 1 tablet (100 mg total) by mouth 2 (two) times daily.  Dispense: 14 tablet; Refill: 0    Plan:    Continue to cleanse with soap and water and apply bandaid. F/u with his primary M.D., Dr. Caryn Section, next week as scheduled.

## 2016-05-06 LAB — CUP PACEART REMOTE DEVICE CHECK: Date Time Interrogation Session: 20170529044827

## 2016-05-06 NOTE — Progress Notes (Signed)
Carelink summary report received. Battery status OK. Normal device function. No new symptom episodes, brady, or pause episodes. No new AF episodes. Tachy episodes are AF, +Xarelto. Monthly summary reports and ROV/PRN

## 2016-05-08 ENCOUNTER — Encounter: Payer: Self-pay | Admitting: Family Medicine

## 2016-05-08 ENCOUNTER — Ambulatory Visit (INDEPENDENT_AMBULATORY_CARE_PROVIDER_SITE_OTHER): Payer: 59 | Admitting: Family Medicine

## 2016-05-08 VITALS — BP 138/70 | HR 77 | Temp 97.8°F | Resp 16 | Wt 232.0 lb

## 2016-05-08 DIAGNOSIS — Z658 Other specified problems related to psychosocial circumstances: Secondary | ICD-10-CM

## 2016-05-08 DIAGNOSIS — Z794 Long term (current) use of insulin: Secondary | ICD-10-CM

## 2016-05-08 DIAGNOSIS — L02234 Carbuncle of groin: Secondary | ICD-10-CM

## 2016-05-08 DIAGNOSIS — E1140 Type 2 diabetes mellitus with diabetic neuropathy, unspecified: Secondary | ICD-10-CM

## 2016-05-08 DIAGNOSIS — F439 Reaction to severe stress, unspecified: Secondary | ICD-10-CM

## 2016-05-08 MED ORDER — DOXYCYCLINE HYCLATE 100 MG PO TABS: 100.0000 mg | ORAL_TABLET | Freq: Two times a day (BID) | ORAL | Status: AC

## 2016-05-08 NOTE — Progress Notes (Signed)
Patient: Sean Hayden Male    DOB: 12-14-1945   70 y.o.   MRN: [id number] Visit Date: 05/08/2016  Today's Provider: Lelon Huh, MD   Chief Complaint  Patient presents with  . Anxiety    follow up   Subjective:    Anxiety Presents for follow-up visit. Episode onset: Patient was last seen 04/16/2016 for Situational stress and was started on Paxil 20mg . The problem has been gradually improving. Symptoms include dry mouth, excessive worry, insomnia, irritability, nervous/anxious behavior and palpitations. Patient reports no chest pain, compulsions, nausea, shortness of breath or suicidal ideas. The symptoms are aggravated by family issues.   Compliance with medications: 100%   Also follow up abscess right inguinal area seen last week by Mariel Sleet and started on doxycycline and doing much better.   Follow up diabetes: Has been more strict with diet since last visit in March. Had A1c checked at [facility] April 20 which was 6.6.  Has reduced Levemir to 25hx and 40 am since being on Invokana which he is only taking 1/2 tablet a day.     Allergies  Allergen Reactions  . Actos  [Pioglitazone]    Previous Medications   ACYCLOVIR (ZOVIRAX) 200 MG CAPSULE    Take by mouth as needed. Reported on 04/10/2016   ATORVASTATIN (LIPITOR) 40 MG TABLET    Take 20 mg by mouth.    CANAGLIFLOZIN (INVOKANA) 300 MG TABS TABLET    Take 1 tablet (300 mg total) by mouth every morning.   CAPSAICIN (ZOSTRIX) 0.025 % CREAM    Apply topically as needed.   CHOLECALCIFEROL (VITAMIN D PO)    Take 1 tablet by mouth daily.   CYANOCOBALAMIN (VITAMIN B 12 PO)    Take 1 tablet by mouth 2 (two) times daily.   DOXYCYCLINE (VIBRA-TABS) 100 MG TABLET    Take 1 tablet (100 mg total) by mouth 2 (two) times daily.   FLAXSEED, LINSEED, (FLAXSEED OIL) 1000 MG CAPS    Take 1,000 mg by mouth 2 (two) times daily.   FLUTICASONE (FLONASE) 50 MCG/ACT NASAL SPRAY    Place into the nose.   GABAPENTIN (NEURONTIN) 100 MG CAPSULE     Take 100 mg by mouth 3 (three) times daily.   INDOMETHACIN (INDOCIN SR) 75 MG CR CAPSULE    Take by mouth as needed.    INSULIN DETEMIR (LEVEMIR) 100 UNIT/ML INJECTION    Inject 0.65 mLs (65 Units total) into the skin 2 (two) times daily.   LISINOPRIL (PRINIVIL,ZESTRIL) 5 MG TABLET    Take 5 mg by mouth daily.    LORATADINE (CLARITIN) 10 MG TABLET    Take by mouth daily.    MENTHOL-METHYL SALICYLATE (THERA-GESIC EX)    Apply topically as needed.   METFORMIN (GLUCOPHAGE) 1000 MG TABLET    Take 1 tablet (1,000 mg total) by mouth 2 (two) times daily with a meal.   MISC NATURAL PRODUCTS (OSTEO BI-FLEX TRIPLE STRENGTH PO)    Take 1 tablet by mouth 2 (two) times daily.   NEOMYCIN-POLYMYXIN-HYDROCORTISONE (CORTISPORIN) OTIC SOLUTION    Place 4 drops into the left ear 4 (four) times daily.   OMEGA-3 FATTY ACIDS (FISH OIL) 1200 MG CAPS    Take by mouth.   PAROXETINE (PAXIL) 20 MG TABLET    1/2 tablet every evening for 4 days, then increase to 1 tablet daily   RIVAROXABAN (XARELTO) 20 MG TABS TABLET    Take 1 tablet (20 mg total) by  mouth daily with supper.   SPECIALTY VITAMINS PRODUCTS PO    Take by mouth. Reported on 03/06/2016   TRIAMCINOLONE CREAM (KENALOG) 0.5 %    TRIAMCINOLONE ACETONIDE, 0.5% (External Cream)  1 application to affected area wo times daily, as needed for 0 days  Quantity: 30;  Refills: 1   Ordered :14-Jan-2014  Lelon Huh MD;  Started 14-Jan-2014 Active Comments: Medication taken as needed.    UNABLE TO FIND    Take 2 capsules by mouth daily at 2 am. Med Name: prosvent 355   ZOLPIDEM (AMBIEN) 10 MG TABLET    TAKE 1/2 TO 1 TABLET AT BEDTIME    Review of Systems  Constitutional: Positive for irritability. Negative for fever, chills and appetite change.  Respiratory: Negative for chest tightness, shortness of breath and wheezing.   Cardiovascular: Positive for palpitations. Negative for chest pain.  Gastrointestinal: Negative for nausea, vomiting and abdominal pain.    Psychiatric/Behavioral: Positive for sleep disturbance and agitation. Negative for suicidal ideas and self-injury. The patient is nervous/anxious and has insomnia.     Social History  Substance Use Topics  . Smoking status: Former Smoker    Types: Cigars    Quit date: 04/08/2012  . Smokeless tobacco: Not on file     Comment: cigarettes and cigars  . Alcohol Use: No   Objective:   BP 138/70 mmHg  Pulse 77  Temp(Src) 97.8 F (36.6 C) (Oral)  Resp 16  Wt 232 lb (105.235 kg)  SpO2 98%  Physical Exam  General : Awake, alert, oriented x 3 Oval 3 cm erythematous lesion c/w carbuncle in in his right groin fold. No underlying induration or drainage.     Assessment & Plan:     1. Situational stress Doing much better since Starting paroxetine  2. Carbuncle, groin Improved, but not resolved. Extend doxycyline to total 14 days.  - doxycycline (VIBRA-TABS) 100 MG tablet; Take 1 tablet (100 mg total) by mouth 2 (two) times daily.  Dispense: 14 tablet; Refill: 0  3. Type 2 diabetes mellitus with diabetic neuropathy, with long-term current use of insulin ([place]) Much better on Invokana. [facility] July where he expects to have labs done. Follow up here in August.      The entirety of the information documented in the History of Present Illness, Review of Systems and Physical Exam were personally obtained by me. Portions of this information were initially documented by Meyer Cory, CMA and reviewed by me for thoroughness and accuracy.    Lelon Huh, MD  [facility]

## 2016-05-13 ENCOUNTER — Other Ambulatory Visit: Payer: Self-pay | Admitting: Family Medicine

## 2016-05-23 ENCOUNTER — Ambulatory Visit (INDEPENDENT_AMBULATORY_CARE_PROVIDER_SITE_OTHER): Payer: Commercial Managed Care - HMO | Admitting: *Deleted

## 2016-05-23 DIAGNOSIS — I639 Cerebral infarction, unspecified: Secondary | ICD-10-CM

## 2016-05-24 NOTE — Progress Notes (Signed)
Carelink Summary Report / Loop Recorder 

## 2016-05-28 LAB — CUP PACEART REMOTE DEVICE CHECK: Date Time Interrogation Session: 20170620113325

## 2016-05-28 LAB — HEMOGLOBIN A1C: HEMOGLOBIN A1C: 6.1

## 2016-05-30 ENCOUNTER — Ambulatory Visit: Payer: Commercial Managed Care - HMO | Admitting: Family Medicine

## 2016-06-21 LAB — CUP PACEART REMOTE DEVICE CHECK: Date Time Interrogation Session: 20170714085625

## 2016-06-24 ENCOUNTER — Ambulatory Visit (INDEPENDENT_AMBULATORY_CARE_PROVIDER_SITE_OTHER): Payer: Commercial Managed Care - HMO | Admitting: *Deleted

## 2016-06-24 DIAGNOSIS — I639 Cerebral infarction, unspecified: Secondary | ICD-10-CM

## 2016-06-24 NOTE — Progress Notes (Signed)
Carelink Summary Report / Loop Recorder 

## 2016-06-29 ENCOUNTER — Other Ambulatory Visit: Payer: Self-pay | Admitting: Family Medicine

## 2016-07-22 ENCOUNTER — Ambulatory Visit (INDEPENDENT_AMBULATORY_CARE_PROVIDER_SITE_OTHER): Payer: Commercial Managed Care - HMO | Admitting: *Deleted

## 2016-07-22 DIAGNOSIS — I639 Cerebral infarction, unspecified: Secondary | ICD-10-CM

## 2016-07-23 NOTE — Progress Notes (Signed)
Carelink Summary Report / Loop Recorder 

## 2016-07-31 ENCOUNTER — Encounter: Payer: Self-pay | Admitting: Family Medicine

## 2016-07-31 ENCOUNTER — Ambulatory Visit (INDEPENDENT_AMBULATORY_CARE_PROVIDER_SITE_OTHER): Payer: Commercial Managed Care - HMO | Admitting: Family Medicine

## 2016-07-31 VITALS — BP 120/54 | HR 72 | Temp 97.7°F | Resp 18 | Ht 67.0 in | Wt 231.0 lb

## 2016-07-31 DIAGNOSIS — Z794 Long term (current) use of insulin: Secondary | ICD-10-CM | POA: Diagnosis not present

## 2016-07-31 DIAGNOSIS — F439 Reaction to severe stress, unspecified: Secondary | ICD-10-CM

## 2016-07-31 DIAGNOSIS — I10 Essential (primary) hypertension: Secondary | ICD-10-CM

## 2016-07-31 DIAGNOSIS — E1140 Type 2 diabetes mellitus with diabetic neuropathy, unspecified: Secondary | ICD-10-CM | POA: Diagnosis not present

## 2016-07-31 DIAGNOSIS — Z658 Other specified problems related to psychosocial circumstances: Secondary | ICD-10-CM | POA: Diagnosis not present

## 2016-07-31 NOTE — Progress Notes (Signed)
Patient: Sean Hayden Male    DOB: [birth year]   70 y.o.   MRN: [id number] Visit Date: 07/31/2016  Today's Provider: Lelon Huh, MD   Chief Complaint  Patient presents with  . Follow-up  . Diabetes   Subjective:    HPI   Situational stress: Follow up 05/08/2016-Doing much better since  Starting paroxetine and feels he is doing well.    Diabetes Mellitus Type II, Follow-up:   Lab Results  Component Value Date   HGBA1C 6.1 05/28/2016   HGBA1C 8.1 (H) 02/21/2016   HGBA1C 7.3 11/23/2015   Last seen for diabetes 3 months ago.  Management since then includes; no changes, [facility]. He reports good compliance with treatment. He is not having side effects. none Current symptoms include none and have been unchanged. Home blood sugar records: fasting range: 100-110  Episodes of hypoglycemia? Not since cutting Invokana to 1/2 tablet daily    Current Insulin Regimen: Levemir 40 units in the am and 30 units in the pm  Most Recent Eye Exam: due Weight trend: stable Prior visit with dietician: no Current diet: well balanced Current exercise: walking  Had labs done at [place] in July with A1c=6.1 and normalc CBC, kidney liver functions and electrolytes.  ----------------------------------------------------------------    Allergies  Allergen Reactions  . Actos  [Pioglitazone]    Current Meds  Medication Sig  . acyclovir (ZOVIRAX) 200 MG capsule Take by mouth as needed. Reported on 04/10/2016  . atorvastatin (LIPITOR) 40 MG tablet Take 20 mg by mouth.   . capsaicin (ZOSTRIX) 0.025 % cream Apply topically as needed.  . Cholecalciferol (VITAMIN D PO) Take 1 tablet by mouth daily.  . Cyanocobalamin (VITAMIN B 12 PO) Take 1 tablet by mouth 2 (two) times daily.  . Flaxseed, Linseed, (FLAXSEED OIL) 1000 MG CAPS Take 1,000 mg by mouth 2 (two) times daily.  . fluticasone (FLONASE) 50 MCG/ACT nasal spray Place into the nose.  . gabapentin (NEURONTIN) 100 MG  capsule Take 100 mg by mouth 3 (three) times daily.  . indomethacin (INDOCIN SR) 75 MG CR capsule Take by mouth as needed.   . insulin detemir (LEVEMIR) 100 UNIT/ML injection Inject 0.65 mLs (65 Units total) into the skin 2 (two) times daily.  . INVOKANA 300 MG TABS tablet TAKE ONE TABLET EVERY MORNING (Patient taking differently: TAKE 1/2 TABLET EVERY MORNING)  . lisinopril (PRINIVIL,ZESTRIL) 5 MG tablet Take 2.5 mg by mouth daily.   [name] loratadine (CLARITIN) 10 MG tablet Take by mouth daily.   . Menthol-Methyl Salicylate ([place] EX) Apply topically as needed.  . metFORMIN (GLUCOPHAGE) 1000 MG tablet Take 1 tablet (1,000 mg total) by mouth 2 (two) times daily with a meal. (Patient taking differently: Take 1,000 mg by mouth 3 (three) times daily. )  . Misc Natural Products (OSTEO BI-FLEX TRIPLE STRENGTH PO) Take 1 tablet by mouth 2 (two) times daily.  [name] neomycin-polymyxin-hydrocortisone (CORTISPORIN) otic solution Place 4 drops into the left ear 4 (four) times daily.  . Omega-3 Fatty Acids (FISH OIL) 1200 MG CAPS Take by mouth.  [name] PARoxetine (PAXIL) 20 MG tablet Take 1 tablet (20 mg total) by mouth daily.  . rivaroxaban (XARELTO) 20 MG TABS tablet Take 1 tablet (20 mg total) by mouth daily with supper.  [name] SPECIALTY VITAMINS PRODUCTS PO Take by mouth. Reported on 03/06/2016  . triamcinolone cream (KENALOG) 0.5 % TRIAMCINOLONE ACETONIDE, 0.5% (External Cream)  1 application to affected area  wo times daily, as needed for 0 days  Quantity: 30;  Refills: 1   Ordered :14-Jan-2014  Lelon Huh MD;  Started 14-Jan-2014 Active Comments: Medication taken as needed.   [name] UNABLE TO FIND Take 2 capsules by mouth daily at 2 am. Med Name: prosvent 355  . zolpidem (AMBIEN) 10 MG tablet TAKE 1/2 TO 1 TABLET AT BEDTIME    Review of Systems  Constitutional: Negative for appetite change, chills and fever.  Respiratory: Negative for chest tightness, shortness of breath and wheezing.   Cardiovascular:  Negative for chest pain and palpitations.  Gastrointestinal: Negative for abdominal pain, nausea and vomiting.    Social History  Substance Use Topics  . Smoking status: Former Smoker    Types: Cigars    Quit date: 04/08/2012  . Smokeless tobacco: Not on file     Comment: cigarettes and cigars  . Alcohol use No   Objective:   BP (!) 120/54 (BP Location: Right Arm, Patient Position: Sitting, Cuff Size: Large)   Pulse 72   Temp 97.7 F (36.5 C) (Oral)   Resp 18   Ht 5\' 7"  (1.702 m)   Wt 231 lb (104.8 kg)   BMI 36.18 kg/m   Physical Exam  General appearance: alert, well developed, well nourished, cooperative and in no distress Head: Normocephalic, without obvious abnormality, atraumatic Respiratory: Respirations even and unlabored, normal respiratory rate Extremities: No gross deformities Skin: Skin color, texture, turgor normal. No rashes seen  Psych: Appropriate mood and affect. Neurologic: Mental status: Alert, oriented to person, place, and time, thought content appropriate.     Assessment & Plan:     1. Essential hypertension Improved since being on Invokana. He is only taking 1/2 of 5mg  lisinopril and advised he could discontinue it all together. He doesn't want to stop taking it because he has been on it so long, but agreed to stop if he develops any symptoms of hyPOtension such as dizziness, weakness, or light headedness.   2. Type 2 diabetes mellitus with diabetic neuropathy, with long-term current use of insulin ([place]) Very well controlled. Continue current medications. He is to call if he develops any hyPOglycemia.   3. Situational stress Doing well with paroxetine.   Return in about 6 months (around 01/31/2017).       Lelon Huh, MD  [facility]

## 2016-08-05 ENCOUNTER — Encounter: Payer: Self-pay | Admitting: Family Medicine

## 2016-08-20 LAB — CUP PACEART REMOTE DEVICE CHECK: Date Time Interrogation Session: 20170912103123

## 2016-08-21 ENCOUNTER — Ambulatory Visit (INDEPENDENT_AMBULATORY_CARE_PROVIDER_SITE_OTHER): Payer: Commercial Managed Care - HMO | Admitting: *Deleted

## 2016-08-21 DIAGNOSIS — I639 Cerebral infarction, unspecified: Secondary | ICD-10-CM

## 2016-08-22 NOTE — Progress Notes (Signed)
Carelink Summary Report / Loop Recorder 

## 2016-09-13 DIAGNOSIS — Z23 Encounter for immunization: Secondary | ICD-10-CM | POA: Diagnosis not present

## 2016-09-14 LAB — CUP PACEART REMOTE DEVICE CHECK: MDC IDC SESS DTM: 20171007091505

## 2016-09-14 NOTE — Progress Notes (Signed)
Carelink summary report received. Battery status OK. Normal device function. No new symptom episodes, tachy episodes, brady, or pause episodes. No new AF episodes. Monthly summary reports and ROV/PRN 

## 2016-09-20 ENCOUNTER — Ambulatory Visit (INDEPENDENT_AMBULATORY_CARE_PROVIDER_SITE_OTHER): Payer: Commercial Managed Care - HMO | Admitting: *Deleted

## 2016-09-20 DIAGNOSIS — I639 Cerebral infarction, unspecified: Secondary | ICD-10-CM

## 2016-09-21 ENCOUNTER — Ambulatory Visit: Payer: Commercial Managed Care - HMO

## 2016-09-23 NOTE — Progress Notes (Signed)
Carelink Summary Report / Loop Recorder 

## 2016-09-27 ENCOUNTER — Telehealth: Payer: Self-pay | Admitting: Family Medicine

## 2016-09-27 NOTE — Telephone Encounter (Signed)
Called Pt to schedule AWV with [facility] for Nov - knb °

## 2016-10-01 ENCOUNTER — Other Ambulatory Visit: Payer: Self-pay | Admitting: *Deleted

## 2016-10-01 MED ORDER — ZOLPIDEM TARTRATE 10 MG PO TABS: ORAL_TABLET | ORAL | 5 refills | Status: DC

## 2016-10-01 NOTE — Telephone Encounter (Signed)
Please call in zolpidem  

## 2016-10-01 NOTE — Telephone Encounter (Signed)
Rx called in to pharmacy. 

## 2016-10-03 DIAGNOSIS — L03113 Cellulitis of right upper limb: Secondary | ICD-10-CM | POA: Diagnosis not present

## 2016-10-03 DIAGNOSIS — L718 Other rosacea: Secondary | ICD-10-CM | POA: Diagnosis not present

## 2016-10-07 ENCOUNTER — Ambulatory Visit (INDEPENDENT_AMBULATORY_CARE_PROVIDER_SITE_OTHER): Payer: Commercial Managed Care - HMO | Admitting: Family Medicine

## 2016-10-07 VITALS — BP 100/52 | HR 76 | Temp 98.1°F | Resp 16 | Wt 233.8 lb

## 2016-10-07 DIAGNOSIS — Z8614 Personal history of Methicillin resistant Staphylococcus aureus infection: Secondary | ICD-10-CM | POA: Diagnosis not present

## 2016-10-07 DIAGNOSIS — L03113 Cellulitis of right upper limb: Secondary | ICD-10-CM

## 2016-10-07 MED ORDER — CEFTRIAXONE SODIUM 500 MG IJ SOLR
500.0000 mg | Freq: Once | INTRAMUSCULAR | Status: AC
  Administered 2016-10-07: 500 mg via INTRAMUSCULAR

## 2016-10-07 MED ORDER — CEPHALEXIN 500 MG PO CAPS: 500.0000 mg | ORAL_CAPSULE | Freq: Four times a day (QID) | ORAL | 0 refills | Status: DC

## 2016-10-07 NOTE — Patient Instructions (Addendum)
Cleanse elbow with soap and water and apply dressing. Come back in 24 hours so we can recheck. Continue doxycycline.

## 2016-10-07 NOTE — Progress Notes (Signed)
Subjective:     Patient ID: Sean Hayden, male   DOB: July 07, 1946, 70 y.o.   MRN: [id number]  HPI  Chief Complaint  Patient presents with  . Arm Pain    Patient comes in office today with concerns of swelling of his right arm since 10/02/16. Patient states that he has had cold chills and skin is painful and hot to the touch. Patient reports that he is currently being treated for MRSA on his right hand.    States he developed right elbow redness and swelling on or about 10/18. Reports recurrent MRSA infection which he usually treats at home with  I &D. Has been treating a right thumb infection. Saw his dermatologist 10/26 ([place]) and placed on doxycycline. He reports little improvement.  Review of Systems     Objective:   Physical Exam  Constitutional: He appears well-developed and well-nourished. No distress.  Musculoskeletal:  Does not wish to flex his elbow > 90 degrees  Skin:  Right elbow inflamed and tendeer with mild fluctuance/swelling, No drainage. Erythema extends distally to his wrist.       Assessment:    1. Cellulitis of right elbow - cephALEXin (KEFLEX) 500 MG capsule; Take 1 capsule (500 mg total) by mouth 4 (four) times daily.  Dispense: 28 capsule; Refill: 0 - cefTRIAXone (ROCEPHIN) injection 500 mg; Inject 500 mg into the muscle once. - cefTRIAXone (ROCEPHIN) injection 500 mg; Inject 500 mg into the muscle once.  2. Hx MRSA infection: continue doxycycline    Plan:    Will give broader coverage with Rocephin and Keflex. Will recheck tomorrow.

## 2016-10-08 ENCOUNTER — Encounter: Payer: Self-pay | Admitting: Family Medicine

## 2016-10-08 ENCOUNTER — Ambulatory Visit (INDEPENDENT_AMBULATORY_CARE_PROVIDER_SITE_OTHER): Payer: Commercial Managed Care - HMO | Admitting: Family Medicine

## 2016-10-08 VITALS — BP 138/72 | HR 80 | Temp 97.6°F | Resp 16

## 2016-10-08 DIAGNOSIS — L03113 Cellulitis of right upper limb: Secondary | ICD-10-CM

## 2016-10-08 NOTE — Patient Instructions (Signed)
Discussed use of warm compresses to elbow for a few minutes several x day. Continue both antibiotics. We will see you again on  Friday.

## 2016-10-08 NOTE — Progress Notes (Signed)
Subjective:     Patient ID: Sean Hayden, male   DOB: [birth year], 70 y.o.   MRN: [id number]  HPI  Chief Complaint  Patient presents with  . Cellulitis    Right elbow. 1 day recheck. Pt feels this has improved.  Reports compliance with Keflex and doxycycline   Review of Systems     Objective:   Physical Exam  Constitutional: He appears well-developed and well-nourished. No distress.  Skin:  Right forearm erythema appears to be fading. Elbow erythema appears to be coalescing with decreased tenderness.       Assessment:    1. Cellulitis of right elbow: improving with current treatment     Plan:    Continue warm compresses and antibiotics. F/u in 3 days or sooner if worsening.

## 2016-10-09 ENCOUNTER — Observation Stay
Admission: EM | Admit: 2016-10-09 | Discharge: 2016-10-11 | Disposition: A | Discharge status: Home / Self Care | Payer: Commercial Managed Care - HMO | Attending: Internal Medicine | Admitting: Internal Medicine

## 2016-10-09 DIAGNOSIS — E1140 Type 2 diabetes mellitus with diabetic neuropathy, unspecified: Secondary | ICD-10-CM | POA: Diagnosis present

## 2016-10-09 DIAGNOSIS — G4700 Insomnia, unspecified: Secondary | ICD-10-CM | POA: Insufficient documentation

## 2016-10-09 DIAGNOSIS — Z7901 Long term (current) use of anticoagulants: Secondary | ICD-10-CM | POA: Diagnosis not present

## 2016-10-09 DIAGNOSIS — Z87891 Personal history of nicotine dependence: Secondary | ICD-10-CM | POA: Insufficient documentation

## 2016-10-09 DIAGNOSIS — I480 Paroxysmal atrial fibrillation: Secondary | ICD-10-CM | POA: Insufficient documentation

## 2016-10-09 DIAGNOSIS — Z87442 Personal history of urinary calculi: Secondary | ICD-10-CM | POA: Insufficient documentation

## 2016-10-09 DIAGNOSIS — L03113 Cellulitis of right upper limb: Secondary | ICD-10-CM | POA: Diagnosis not present

## 2016-10-09 DIAGNOSIS — Z794 Long term (current) use of insulin: Secondary | ICD-10-CM | POA: Diagnosis not present

## 2016-10-09 DIAGNOSIS — Z6835 Body mass index (BMI) 35.0-35.9, adult: Secondary | ICD-10-CM | POA: Diagnosis not present

## 2016-10-09 DIAGNOSIS — Z8614 Personal history of Methicillin resistant Staphylococcus aureus infection: Secondary | ICD-10-CM | POA: Insufficient documentation

## 2016-10-09 DIAGNOSIS — E669 Obesity, unspecified: Secondary | ICD-10-CM | POA: Diagnosis not present

## 2016-10-09 DIAGNOSIS — E785 Hyperlipidemia, unspecified: Secondary | ICD-10-CM | POA: Insufficient documentation

## 2016-10-09 DIAGNOSIS — Z79899 Other long term (current) drug therapy: Secondary | ICD-10-CM | POA: Insufficient documentation

## 2016-10-09 DIAGNOSIS — I10 Essential (primary) hypertension: Secondary | ICD-10-CM | POA: Diagnosis not present

## 2016-10-09 DIAGNOSIS — E538 Deficiency of other specified B group vitamins: Secondary | ICD-10-CM | POA: Diagnosis not present

## 2016-10-09 DIAGNOSIS — IMO0002 Reserved for concepts with insufficient information to code with codable children: Secondary | ICD-10-CM

## 2016-10-09 DIAGNOSIS — E559 Vitamin D deficiency, unspecified: Secondary | ICD-10-CM | POA: Diagnosis not present

## 2016-10-09 DIAGNOSIS — Z8673 Personal history of transient ischemic attack (TIA), and cerebral infarction without residual deficits: Secondary | ICD-10-CM | POA: Insufficient documentation

## 2016-10-09 DIAGNOSIS — E1165 Type 2 diabetes mellitus with hyperglycemia: Secondary | ICD-10-CM

## 2016-10-09 DIAGNOSIS — E119 Type 2 diabetes mellitus without complications: Secondary | ICD-10-CM | POA: Diagnosis not present

## 2016-10-09 LAB — COMPREHENSIVE METABOLIC PANEL
ALK PHOS: 71 U/L (ref 38–126)
ALT: 15 U/L — ABNORMAL LOW (ref 17–63)
ANION GAP: 7 (ref 5–15)
AST: 18 U/L (ref 15–41)
Albumin: 3.9 g/dL (ref 3.5–5.0)
BILIRUBIN TOTAL: 0.8 mg/dL (ref 0.3–1.2)
BUN: 16 mg/dL (ref 6–20)
CALCIUM: 9.5 mg/dL (ref 8.9–10.3)
CO2: 28 mmol/L (ref 22–32)
CREATININE: 0.68 mg/dL (ref 0.61–1.24)
Chloride: 103 mmol/L (ref 101–111)
Glucose, Bld: 138 mg/dL — ABNORMAL HIGH (ref 65–99)
Potassium: 4 mmol/L (ref 3.5–5.1)
SODIUM: 138 mmol/L (ref 135–145)
TOTAL PROTEIN: 7.8 g/dL (ref 6.5–8.1)

## 2016-10-09 LAB — LACTIC ACID, PLASMA: LACTIC ACID, VENOUS: 0.9 mmol/L (ref 0.5–1.9)

## 2016-10-09 LAB — CBC WITH DIFFERENTIAL/PLATELET
Basophils Absolute: 0 10*3/uL (ref 0–0.1)
Basophils Relative: 0 %
EOS ABS: 0.1 10*3/uL (ref 0–0.7)
Eosinophils Relative: 2 %
HEMATOCRIT: 43.1 % (ref 40.0–52.0)
HEMOGLOBIN: 14.8 g/dL (ref 13.0–18.0)
LYMPHS ABS: 1.2 10*3/uL (ref 1.0–3.6)
LYMPHS PCT: 15 %
MCH: 31.3 pg (ref 26.0–34.0)
MCHC: 34.3 g/dL (ref 32.0–36.0)
MCV: 91.3 fL (ref 80.0–100.0)
MONOS PCT: 9 %
Monocytes Absolute: 0.7 10*3/uL (ref 0.2–1.0)
NEUTROS PCT: 74 %
Neutro Abs: 6 10*3/uL (ref 1.4–6.5)
Platelets: 229 10*3/uL (ref 150–440)
RBC: 4.72 MIL/uL (ref 4.40–5.90)
RDW: 14.1 % (ref 11.5–14.5)
WBC: 8 10*3/uL (ref 3.8–10.6)

## 2016-10-09 LAB — GLUCOSE, CAPILLARY: GLUCOSE-CAPILLARY: 143 mg/dL — AB (ref 65–99)

## 2016-10-09 MED ORDER — GABAPENTIN 100 MG PO CAPS
100.0000 mg | ORAL_CAPSULE | Freq: Three times a day (TID) | ORAL | Status: DC
  Administered 2016-10-10 – 2016-10-11 (×4): 100 mg via ORAL
  Filled 2016-10-09 (×4): qty 1

## 2016-10-09 MED ORDER — VANCOMYCIN HCL IN DEXTROSE 1-5 GM/200ML-% IV SOLN
1000.0000 mg | Freq: Once | INTRAVENOUS | Status: AC
  Administered 2016-10-09: 1000 mg via INTRAVENOUS
  Filled 2016-10-09: qty 200

## 2016-10-09 MED ORDER — ACETAMINOPHEN 650 MG RE SUPP
650.0000 mg | Freq: Four times a day (QID) | RECTAL | Status: DC | PRN
Start: 2016-10-09 — End: 2016-10-11

## 2016-10-09 MED ORDER — LISINOPRIL 5 MG PO TABS
2.5000 mg | ORAL_TABLET | Freq: Every day | ORAL | Status: DC
  Administered 2016-10-10 – 2016-10-11 (×2): 2.5 mg via ORAL
  Filled 2016-10-09 (×2): qty 1

## 2016-10-09 MED ORDER — ONDANSETRON HCL 4 MG PO TABS
4.0000 mg | ORAL_TABLET | Freq: Four times a day (QID) | ORAL | Status: DC | PRN
Start: 2016-10-09 — End: 2016-10-11

## 2016-10-09 MED ORDER — SODIUM CHLORIDE 0.9 % IV SOLN
3.0000 g | Freq: Once | INTRAVENOUS | Status: AC
  Administered 2016-10-09: 3 g via INTRAVENOUS
  Filled 2016-10-09: qty 3

## 2016-10-09 MED ORDER — ONDANSETRON HCL 4 MG/2ML IJ SOLN: 4.0000 mg | Freq: Four times a day (QID) | INTRAMUSCULAR | Status: DC | PRN

## 2016-10-09 MED ORDER — INSULIN ASPART 100 UNIT/ML ~~LOC~~ SOLN
0.0000 [IU] | Freq: Three times a day (TID) | SUBCUTANEOUS | Status: DC
  Administered 2016-10-10 (×2): 2 [IU] via SUBCUTANEOUS
  Administered 2016-10-11: 09:00:00 1 [IU] via SUBCUTANEOUS
  Filled 2016-10-09 (×2): qty 2
  Filled 2016-10-09: qty 1

## 2016-10-09 MED ORDER — RIVAROXABAN 10 MG PO TABS
20.0000 mg | ORAL_TABLET | Freq: Every day | ORAL | Status: DC
  Administered 2016-10-10: 20 mg via ORAL
  Filled 2016-10-09: qty 2

## 2016-10-09 MED ORDER — PAROXETINE HCL 20 MG PO TABS
20.0000 mg | ORAL_TABLET | Freq: Every day | ORAL | Status: DC
  Administered 2016-10-10 – 2016-10-11 (×2): 20 mg via ORAL
  Filled 2016-10-09 (×2): qty 1

## 2016-10-09 MED ORDER — ATORVASTATIN CALCIUM 20 MG PO TABS
20.0000 mg | ORAL_TABLET | Freq: Every day | ORAL | Status: DC
  Administered 2016-10-10: 20 mg via ORAL
  Filled 2016-10-09: qty 1

## 2016-10-09 MED ORDER — INSULIN ASPART 100 UNIT/ML ~~LOC~~ SOLN: 0.0000 [IU] | Freq: Every day | SUBCUTANEOUS | Status: DC

## 2016-10-09 MED ORDER — OXYCODONE HCL 5 MG PO TABS: 5.0000 mg | ORAL_TABLET | ORAL | Status: DC | PRN

## 2016-10-09 MED ORDER — ACETAMINOPHEN 325 MG PO TABS
650.0000 mg | ORAL_TABLET | Freq: Four times a day (QID) | ORAL | Status: DC | PRN
  Administered 2016-10-10: 650 mg via ORAL
  Filled 2016-10-09: qty 2

## 2016-10-09 NOTE — ED Provider Notes (Signed)
Time Seen: Approximately 2058  I have reviewed the triage notes  Chief Complaint: Cellulitis   History of Present Illness: Sean Hayden is a 70 y.o. male *who was referred here from local Clinic for evaluation of some persistent right arm swelling and redness. Patient has a known history of diabetes and is been under treatment for right arm cellulitis. Patient states that approximately a week ago received IM shot of medication and is been on Keflex and doxycycline. Patient's redness and swelling has gotten progressively worse especially over the last 12 hours. No fever at home. He denies any new arm pain or weakness. He has a known history  of MRSA. Past Medical History:  Diagnosis Date  . B12 deficiency   . Diabetes mellitus without complication ([place])   . Diverticulosis   . Gout   . Gouty arthropathy   . Hemorrhoids   . History of right-sided carotid endarterectomy 04/17/12  . Hyperplastic colon polyp   . Hypertension   . Kidney stones   . MRSA infection   . Nephrolithiasis   . Neuropathy ([place])   . OSA (obstructive sleep apnea)   . Personal history of urinary calculi 05/25/2015    Patient Active Problem List   Diagnosis Date Noted  . Right arm cellulitis 10/09/2016  . Situational stress 04/16/2016  . Paroxysmal atrial fibrillation ([place]) 03/26/2016  . Vitamin D deficiency 02/28/2016  . Aortic valve stenosis, congenital   . History of CVA (cerebrovascular accident) without residual deficits 02/22/2016  . Diabetes mellitus with neuropathy ([place]) 02/21/2016  . Hyperlipidemia 02/21/2016  . Hypertension 02/21/2016  . History of right-sided carotid endarterectomy 02/21/2016  . Allergic rhinitis 02/21/2016  . B12 deficiency 12/14/2015  . Arthritis 05/25/2015  . Diverticulosis of colon 05/25/2015  . Edema 05/25/2015  . Obstructive sleep apnea of adult 05/25/2015  . Psoriasis 05/25/2015  . RAD (reactive airway disease) 05/25/2015  . TIA (transient ischemic attack) 05/25/2015   . Carotid artery disease ([place]) 03/10/2012  . Obesity 01/18/2008  . Insomnia 12/09/2006  . Gouty arthritis 02/07/1999    Past Surgical History:  Procedure Laterality Date  . CAROTID ARTERY ANGIOPLASTY Right 04/17/2012   Dr. Delana Meyer, [facility]  . CAROTID ENDARTERECTOMY Right 04/17/2012   Dr. Delana Meyer, [facility]  . CHOLECYSTECTOMY  2005   Lap  . CHOLECYSTECTOMY  2005   lap  . EP IMPLANTABLE DEVICE N/A 02/23/2016   Procedure: Loop Recorder Insertion;  Surgeon: Thompson Grayer, MD;  Location: [place] [facility];  Service: Cardiovascular;  Laterality: N/A;  . HEMORRHOID SURGERY  2009  . TEE WITHOUT CARDIOVERSION N/A 02/23/2016   Procedure: TRANSESOPHAGEAL ECHOCARDIOGRAM (TEE);  Surgeon: Pixie Casino, MD;  Location[facility] ENDOSCOPY;  Service: Cardiovascular;  Laterality: N/A;  ALSO GETTING A LOOP    Past Surgical History:  Procedure Laterality Date  . CAROTID ARTERY ANGIOPLASTY Right 04/17/2012   Dr. Delana Meyer, [facility]  . CAROTID ENDARTERECTOMY Right 04/17/2012   Dr. Delana Meyer, [facility]  . CHOLECYSTECTOMY  2005   Lap  . CHOLECYSTECTOMY  2005   lap  . EP IMPLANTABLE DEVICE N/A 02/23/2016   Procedure: Loop Recorder Insertion;  Surgeon: Thompson Grayer, MD;  Location: [place] [facility];  Service: Cardiovascular;  Laterality: N/A;  . HEMORRHOID SURGERY  2009  . TEE WITHOUT CARDIOVERSION N/A 02/23/2016   Procedure: TRANSESOPHAGEAL ECHOCARDIOGRAM (TEE);  Surgeon: Pixie Casino, MD;  Location: [facility] ENDOSCOPY;  Service: Cardiovascular;  Laterality: N/A;  ALSO GETTING A LOOP    Current Outpatient Rx  . Order #:  [id number] Class: Historical Med  . Order #: [id number] Class: Historical Med  . Order #: [id number] Class: Historical Med  . Order #: [id number] Class: Normal  . Order #: [id number] Class: Historical Med  . Order #: [id number] Class: Historical Med  . Order #: [id number] Class: Historical Med  . Order #: [id number] Class: Historical Med  . Order #: [id number] Class: Historical Med  . Order #: [id number] Class: Historical  Med  . Order #: [id number] Class: No Print  . Order #: [id number] Class: Normal  . Order #: [id number] Class: Historical Med  . Order #: [id number] Class: Historical Med  . Order #: [id number] Class: Historical Med  . Order #: [id number] Class: No Print  . Order #: [id number] Class: Historical Med  . Order #: [id number] Class: Normal  . Order #: [id number] Class: Historical Med  . Order #: [id number] Class: Normal  . Order #: [id number] Class: Normal  . Order #: [id number] Class: Historical Med  . Order #: [id number] Class: Historical Med  . Order #: [id number] Class: Historical Med  . Order #: [id number] Class: Phone In    Allergies:  Actos  [pioglitazone]  Family History: Family History  Problem Relation Age of Onset  . Hodgkin's lymphoma Father     Social History: Social History  Substance Use Topics  . Smoking status: Former Smoker    Types: Cigars    Quit date: 04/08/2012  . Smokeless tobacco: Not on file     Comment: cigarettes and cigars  . Alcohol use No     Review of Systems:   10 point review of systems was performed and was otherwise negative:  Constitutional: No fever Eyes: No visual disturbances ENT: No sore throat, ear pain Cardiac: No chest pain Respiratory: No shortness of breath, wheezing, or stridor Abdomen: No abdominal pain, no vomiting, No diarrhea Endocrine: No weight loss, No night sweats Extremities: Increased swelling in the right upper extremity Skin: No rashes, easy bruising Neurologic: No focal weakness, trouble with speech or swollowing Urologic: No dysuria, Hematuria, or urinary frequency   Physical Exam:  ED Triage Vitals [10/09/16 1808]  Enc Vitals Group     BP 120/67     Pulse Rate 82     Resp 18     Temp 98 F (36.7 C)     Temp Source Oral     SpO2 100 %     Weight 233 lb (105.7 kg)     Height 5\' 8"  (1.727 m)     Head Circumference      Peak Flow      Pain Score 3     Pain Loc      Pain Edu?      Excl. in [place]?     General: Awake , Alert ,  and Oriented times 3; GCS 15 Head: Normal cephalic , atraumatic Eyes: Pupils equal , round, reactive to light Nose/Throat: No nasal drainage, patent upper airway without erythema or exudate.  Neck: Supple, Full range of motion, No anterior adenopathy or palpable thyroid masses Lungs: Clear to ascultation without wheezes , rhonchi, or rales Heart: Regular rate, regular rhythm without murmurs , gallops , or rubs Abdomen: Soft, non tender without rebound, guarding , or rigidity; bowel sounds positive and symmetric in all 4 quadrants. No organomegaly .        Extremities:Right arm is red and swollen with swelling mainly extended over the posterior surface along with redness of the arm from the elbow distal. 2 minor skin lesions on the elbow. He has good supination and pronation without any reproducible pain.  Neurologic: normal ambulation, Motor symmetric without deficits,  sensory intact Skin: warm, dry, no rash or redness noted right upper extremity no other skin lesions or erythema is noted. es   Labs:   All laboratory work was reviewed including any pertinent negatives or positives listed below:  Labs Reviewed  COMPREHENSIVE METABOLIC PANEL - Abnormal; Notable for the following:       Result Value   Glucose, Bld 138 (*)    ALT 15 (*)    All other components within normal limits  CULTURE, BLOOD (ROUTINE X 2)  CULTURE, BLOOD (ROUTINE X 2)  CBC WITH DIFFERENTIAL/PLATELET  LACTIC ACID, PLASMA  LACTIC ACID, PLASMA     ED Course: Patient had blood cultures 2 obtained and was initiated on IV antibiotic therapy. He is otherwise hemodynamically stable and and I felt it was unlikely was septic at this time. Also unlikely is any underlying deep venous thrombosis in her right upper extremity. He does not appear to have any joint involvement at the elbow clinically with good supination and pronation and flexion without any reproducible joint discomfort. Clinical Course     Assessment:  * Cellulitis of the right upper extremity  Final Clinical Impression:   Final diagnoses:  Cellulitis of right upper extremity     Plan: Inpatient            Daymon Larsen, MD 10/09/16 2213

## 2016-10-09 NOTE — ED Notes (Signed)
Transporting patient to room 107-1C 

## 2016-10-09 NOTE — ED Triage Notes (Addendum)
Pt sent from [facility] for cellulitis X 1 week. Redness and swelling to right lower arm. Pain with movement of right arm. Cracking of skin at elbow. Pt alert and oriented X4, active, cooperative, pt in NAD. RR even and unlabored, color WNL.

## 2016-10-09 NOTE — ED Notes (Signed)
Bed placement called at this time to arrange for pt to be placed next to his wife who is pt on 1c

## 2016-10-09 NOTE — H&P (Signed)
[facility] [place] NAME: Sean Hayden    MR#:  [id number]  DATE OF BIRTH:  1946-09-14  DATE OF ADMISSION:  10/09/2016  PRIMARY CARE PHYSICIAN: Lelon Huh, MD   REQUESTING/REFERRING PHYSICIAN: Marcelene Butte, M.D.  CHIEF COMPLAINT:   Chief Complaint  Patient presents with  . Cellulitis    HISTORY OF PRESENT ILLNESS:  Sean Hayden  is a 70 y.o. male who presents with Right arm cellulitis. Patient states that this began some days ago and he was seen by his outpatient physician and placed on antibiotics. Since that time swelling and erythema has increased. He states he has a long history of getting cellulitis, with a very MRSA-type description to his infections, which oftentimes include boils and pustules. Hospitals were called for admission given that he has a cellulitis which failed outpatient antibiotics.[name]  PAST MEDICAL HISTORY:   Past Medical History:  Diagnosis Date  . B12 deficiency   . Diabetes mellitus without complication ([place])   . Diverticulosis   . Gout   . Gouty arthropathy   . Hemorrhoids   . History of right-sided carotid endarterectomy 04/17/12  . Hyperplastic colon polyp   . Hypertension   . Kidney stones   . MRSA infection   . Nephrolithiasis   . Neuropathy ([place])   . OSA (obstructive sleep apnea)   . Personal history of urinary calculi 05/25/2015    PAST SURGICAL HISTORY:   Past Surgical History:  Procedure Laterality Date  . CAROTID ARTERY ANGIOPLASTY Right 04/17/2012   Dr. Delana Meyer, [facility]  . CAROTID ENDARTERECTOMY Right 04/17/2012   Dr. Delana Meyer, [facility]  . CHOLECYSTECTOMY  2005   Lap  . CHOLECYSTECTOMY  2005   lap  . EP IMPLANTABLE DEVICE N/A 02/23/2016   Procedure: Loop Recorder Insertion;  Surgeon: Thompson Grayer, MD;  Location: [facility];  Service: Cardiovascular;  Laterality: N/A;  . HEMORRHOID SURGERY  2009  . TEE WITHOUT CARDIOVERSION N/A 02/23/2016   Procedure: TRANSESOPHAGEAL  ECHOCARDIOGRAM (TEE);  Surgeon: Pixie Casino, MD;  Location: [facility] ENDOSCOPY;  Service: Cardiovascular;  Laterality: N/A;  ALSO GETTING A LOOP    SOCIAL HISTORY:   Social History  Substance Use Topics  . Smoking status: Former Smoker    Types: Cigars    Quit date: 04/08/2012  . Smokeless tobacco: Not on file     Comment: cigarettes and cigars  . Alcohol use No    FAMILY HISTORY:   Family History  Problem Relation Age of Onset  . Hodgkin's lymphoma Father     DRUG ALLERGIES:   Allergies  Allergen Reactions  . Actos  [Pioglitazone]     MEDICATIONS AT HOME:   Prior to Admission medications   Medication Sig Start Date End Date Taking? Authorizing Provider  acyclovir (ZOVIRAX) 200 MG capsule Take by mouth as needed. Reported on 04/10/2016 06/04/12   Historical Provider, MD  atorvastatin (LIPITOR) 40 MG tablet Take 20 mg by mouth.  11/01/14   Historical Provider, MD  capsaicin (ZOSTRIX) 0.025 % cream Apply topically as needed.    Historical Provider, MD  cephALEXin (KEFLEX) 500 MG capsule Take 1 capsule (500 mg total) by mouth 4 (four) times daily. 10/07/16   Carmon Ginsberg, PA  Cholecalciferol (VITAMIN D PO) Take 1 tablet by mouth daily.    Historical Provider, MD  Cyanocobalamin (VITAMIN B 12 PO) Take 1 tablet by mouth 2 (two) times daily.    Historical Provider, MD  Flaxseed, Linseed, (FLAXSEED OIL)  1000 MG CAPS Take 1,000 mg by mouth 2 (two) times daily.    Historical Provider, MD  fluticasone (FLONASE) 50 MCG/ACT nasal spray Place into the nose. 03/21/12   Historical Provider, MD  gabapentin (NEURONTIN) 100 MG capsule Take 100 mg by mouth 3 (three) times daily.    Historical Provider, MD  indomethacin (INDOCIN SR) 75 MG CR capsule Take by mouth as needed.  03/15/13   Historical Provider, MD  insulin detemir (LEVEMIR) 100 UNIT/ML injection Inject 0.65 mLs (65 Units total) into the skin 2 (two) times daily. 08/25/15   Birdie Sons, MD  INVOKANA 300 MG TABS tablet TAKE ONE TABLET  EVERY MORNING Patient taking differently: TAKE 1/2 TABLET EVERY MORNING 06/30/16   Birdie Sons, MD  lisinopril (PRINIVIL,ZESTRIL) 5 MG tablet Take 2.5 mg by mouth daily.  05/26/13   Historical Provider, MD  loratadine (CLARITIN) 10 MG tablet Take by mouth daily.  03/24/12   Historical Provider, MD  Menthol-Methyl Salicylate ([place] EX) Apply topically as needed.    Historical Provider, MD  metFORMIN (GLUCOPHAGE) 1000 MG tablet Take 1 tablet (1,000 mg total) by mouth 2 (two) times daily with a meal. Patient taking differently: Take 1,000 mg by mouth 3 (three) times daily.  02/24/16   Norman Herrlich, MD  Misc Natural Products (OSTEO BI-FLEX TRIPLE STRENGTH PO) Take 1 tablet by mouth 2 (two) times daily.    Historical Provider, MD  neomycin-polymyxin-hydrocortisone (CORTISPORIN) otic solution Place 4 drops into the left ear 4 (four) times daily. 04/10/16   Carmon Ginsberg, PA  Omega-3 Fatty Acids (FISH OIL) 1200 MG CAPS Take by mouth.    Historical Provider, MD  PARoxetine (PAXIL) 20 MG tablet Take 1 tablet (20 mg total) by mouth daily. 05/13/16   Birdie Sons, MD  rivaroxaban (XARELTO) 20 MG TABS tablet Take 1 tablet (20 mg total) by mouth daily with supper. 03/28/16   Sherran Needs, NP  SPECIALTY VITAMINS PRODUCTS PO Take by mouth. Reported on 03/06/2016    Historical Provider, MD  triamcinolone cream (KENALOG) 0.5 % TRIAMCINOLONE ACETONIDE, 0.5% (External Cream)  1 application to affected area wo times daily, as needed for 0 days  Quantity: 30;  Refills: 1   Ordered :14-Jan-2014  Lelon Huh MD;  Started 14-Jan-2014 Active Comments: Medication taken as needed.  01/14/14   Historical Provider, MD  UNABLE TO FIND Take 2 capsules by mouth daily at 2 am. Med Name: [id number]    Historical Provider, MD  zolpidem (AMBIEN) 10 MG tablet TAKE 1/2 TO 1 TABLET AT BEDTIME 10/01/16   Birdie Sons, MD    REVIEW OF SYSTEMS:  Review of Systems  Constitutional: Negative for chills, fever,  malaise/fatigue and weight loss.  HENT: Negative for ear pain, hearing loss and tinnitus.   Eyes: Negative for blurred vision, double vision, pain and redness.  Respiratory: Negative for cough, hemoptysis and shortness of breath.   Cardiovascular: Negative for chest pain, palpitations, orthopnea and leg swelling.  Gastrointestinal: Negative for abdominal pain, constipation, diarrhea, nausea and vomiting.  Genitourinary: Negative for dysuria, frequency and hematuria.  Musculoskeletal: Negative for back pain, joint pain and neck pain.  Skin:       Right distal arm swelling and erythema  Neurological: Negative for dizziness, tremors, focal weakness and weakness.  Endo/Heme/Allergies: Negative for polydipsia. Does not bruise/bleed easily.  Psychiatric/Behavioral: Negative for depression. The patient is not nervous/anxious and does not have insomnia.      VITAL SIGNS:  Vitals:   10/09/16 1808 10/09/16 2106  BP: 120/67 134/68  Pulse: 82 88  Resp: 18 16  Temp: 98 F (36.7 C)   TempSrc: Oral   SpO2: 100% 98%  Weight: 105.7 kg (233 lb)   Height: 5\' 8"  (1.727 m)    Wt Readings from Last 3 Encounters:  10/09/16 105.7 kg (233 lb)  10/07/16 106.1 kg (233 lb 12.8 oz)  07/31/16 104.8 kg (231 lb)    PHYSICAL EXAMINATION:  Physical Exam  Vitals reviewed. Constitutional: He is oriented to person, place, and time. He appears well-developed and well-nourished. No distress.  HENT:  Head: Normocephalic and atraumatic.  Mouth/Throat: Oropharynx is clear and moist.  Eyes: Conjunctivae and EOM are normal. Pupils are equal, round, and reactive to light. No scleral icterus.  Neck: Normal range of motion. Neck supple. No JVD present. No thyromegaly present.  Cardiovascular: Normal rate, regular rhythm and intact distal pulses.  Exam reveals no gallop and no friction rub.   No murmur heard. Respiratory: Effort normal and breath sounds normal. No respiratory distress. He has no wheezes. He has no  rales.  GI: Soft. Bowel sounds are normal. He exhibits no distension. There is no tenderness.  Musculoskeletal: Normal range of motion. He exhibits no edema.  No arthritis, no gout  Lymphadenopathy:    He has no cervical adenopathy.  Neurological: He is alert and oriented to person, place, and time. No cranial nerve deficit.  No dysarthria, no aphasia  Skin: Skin is warm and dry. No rash noted. There is erythema (right distal arm and hand with swelling and tenderness as well as warmth).  Psychiatric: He has a normal mood and affect. His behavior is normal. Judgment and thought content normal.    LABORATORY PANEL:   CBC  Recent Labs Lab 10/09/16 1809  WBC 8.0  HGB 14.8  HCT 43.1  PLT 229   ------------------------------------------------------------------------------------------------------------------  Chemistries   Recent Labs Lab 10/09/16 1809  NA 138  K 4.0  CL 103  CO2 28  GLUCOSE 138*  BUN 16  CREATININE 0.68  CALCIUM 9.5  AST 18  ALT 15*  ALKPHOS 71  BILITOT 0.8   ------------------------------------------------------------------------------------------------------------------  Cardiac Enzymes No results for input(s): TROPONINI in the last 168 hours. ------------------------------------------------------------------------------------------------------------------  RADIOLOGY:  No results found.  EKG:   Orders placed or performed during the hospital encounter of 03/28/16  . EKG 12-Lead  . EKG 12-Lead    IMPRESSION AND PLAN:  Principal Problem:   Right arm cellulitis - IV antibiotics ordered per cellulitis order set protocol. Active Problems:   Diabetes mellitus with neuropathy (HCC) - sliding scale insulin with corresponding glucose checks and carb modified diet   Hypertension - currently stable, continue home meds   Paroxysmal atrial fibrillation ([place]) - continue home rate controlling meds as well as anticoagulation   Hyperlipidemia - continue  home meds  All the records are reviewed and case discussed with ED provider. Management plans discussed with the patient and/or family.  DVT PROPHYLAXIS: Systemic anticoagulation  GI PROPHYLAXIS: PPI  ADMISSION STATUS: Observation  CODE STATUS: Full Code Status History    Date Active Date Inactive Code Status Order ID Comments User Context   02/21/2016  8:17 AM 02/24/2016  3:24 PM Full Code [id number]  Juluis Mire, MD ED      TOTAL TIME TAKING CARE OF THIS PATIENT: 40 minutes.    Jahki Witham FIELDING 10/09/2016, 9:42 PM  [facility]  Office  [phone]  CC: Primary care  physician; Lelon Huh, MD

## 2016-10-10 DIAGNOSIS — E119 Type 2 diabetes mellitus without complications: Secondary | ICD-10-CM | POA: Diagnosis not present

## 2016-10-10 DIAGNOSIS — I480 Paroxysmal atrial fibrillation: Secondary | ICD-10-CM | POA: Diagnosis not present

## 2016-10-10 DIAGNOSIS — I10 Essential (primary) hypertension: Secondary | ICD-10-CM | POA: Diagnosis not present

## 2016-10-10 DIAGNOSIS — L03113 Cellulitis of right upper limb: Secondary | ICD-10-CM | POA: Diagnosis not present

## 2016-10-10 LAB — CBC
HCT: 38.7 % — ABNORMAL LOW (ref 40.0–52.0)
Hemoglobin: 13.4 g/dL (ref 13.0–18.0)
MCH: 31.3 pg (ref 26.0–34.0)
MCHC: 34.7 g/dL (ref 32.0–36.0)
MCV: 90.2 fL (ref 80.0–100.0)
PLATELETS: 197 10*3/uL (ref 150–440)
RBC: 4.29 MIL/uL — ABNORMAL LOW (ref 4.40–5.90)
RDW: 13.7 % (ref 11.5–14.5)
WBC: 6.9 10*3/uL (ref 3.8–10.6)

## 2016-10-10 LAB — BASIC METABOLIC PANEL
Anion gap: 7 (ref 5–15)
BUN: 13 mg/dL (ref 6–20)
CHLORIDE: 106 mmol/L (ref 101–111)
CO2: 26 mmol/L (ref 22–32)
CREATININE: 0.52 mg/dL — AB (ref 0.61–1.24)
Calcium: 8.9 mg/dL (ref 8.9–10.3)
GFR calc Af Amer: >60 mL/min (ref >60)
GFR calc non Af Amer: >60 mL/min (ref >60)
GLUCOSE: 68 mg/dL (ref 65–99)
Potassium: 3.6 mmol/L (ref 3.5–5.1)
Sodium: 139 mmol/L (ref 135–145)

## 2016-10-10 LAB — GLUCOSE, CAPILLARY
GLUCOSE-CAPILLARY: 171 mg/dL — AB (ref 65–99)
Glucose-Capillary: 76 mg/dL (ref 65–99)
Glucose-Capillary: 157 mg/dL — ABNORMAL HIGH (ref 65–99)
Glucose-Capillary: 163 mg/dL — ABNORMAL HIGH (ref 65–99)

## 2016-10-10 MED ORDER — PIPERACILLIN-TAZOBACTAM 4.5 G IVPB
4.5000 g | Freq: Three times a day (TID) | INTRAVENOUS | Status: DC
  Administered 2016-10-10: 05:00:00 4.5 g via INTRAVENOUS
  Filled 2016-10-10 (×3): qty 100

## 2016-10-10 MED ORDER — INSULIN DETEMIR 100 UNIT/ML ~~LOC~~ SOLN
15.0000 [IU] | Freq: Two times a day (BID) | SUBCUTANEOUS | Status: DC
  Administered 2016-10-10 – 2016-10-11 (×2): 15 [IU] via SUBCUTANEOUS
  Filled 2016-10-10 (×3): qty 0.15

## 2016-10-10 MED ORDER — PIPERACILLIN-TAZOBACTAM 3.375 G IVPB
3.3750 g | Freq: Three times a day (TID) | INTRAVENOUS | Status: DC
  Administered 2016-10-10: 3.375 g via INTRAVENOUS
  Filled 2016-10-10: qty 50

## 2016-10-10 MED ORDER — ZOLPIDEM TARTRATE 5 MG PO TABS
5.0000 mg | ORAL_TABLET | Freq: Every evening | ORAL | Status: DC | PRN
  Administered 2016-10-10 (×2): 5 mg via ORAL
  Filled 2016-10-10 (×2): qty 1

## 2016-10-10 MED ORDER — VANCOMYCIN HCL IN DEXTROSE 1-5 GM/200ML-% IV SOLN
1000.0000 mg | Freq: Three times a day (TID) | INTRAVENOUS | Status: DC
  Administered 2016-10-10 – 2016-10-11 (×4): 1000 mg via INTRAVENOUS
  Filled 2016-10-10 (×6): qty 200

## 2016-10-10 NOTE — Progress Notes (Signed)
Inpatient Diabetes Program Recommendations  AACE/ADA: New Consensus Statement on Inpatient Glycemic Control (2015)  Target Ranges:  Prepandial:   less than 140 mg/dL      Peak postprandial:   less than 180 mg/dL (1-2 hours)      Critically ill patients:  140 - 180 mg/dL   Lab Results  Component Value Date   GLUCAP 157 (H) 10/10/2016   HGBA1C 6.1 05/28/2016    Diabetes history: Type 2 Outpatient Diabetes medications: Levemir 40 units qam, 30 units qpm, Invokana 150mg /day  Current orders for Inpatient glycemic control: Novolog sensitive correction tid, Novolog 0-5 units qhs  Spoke to patient at the bedside.  He admits to me that he took Levemir 30 units last night while in the ER "before I got to the room".  He confirmed that although he takes 40 units of Levemir qam,  and he has it in his personal belongings, he did not take it this morning.  Please consider ordering Levemir 15 units qhs and Levemir  20 units qam.  Gentry Fitz, RN, BA, MHA, CDE Diabetes Coordinator Inpatient Glycemic Control Team [phone] (Team Pager) [phone] ([place]) 10/10/2016 3:37 PM

## 2016-10-10 NOTE — Progress Notes (Signed)
[facility] [place] NAME: Sean Hayden    MR#:  [id number]  DATE OF BIRTH:  09/12/1946  SUBJECTIVE:  CHIEF COMPLAINT:   Chief Complaint  Patient presents with  . Cellulitis   - right Arm cellulitis. Admitted last night. Failed oral antibiotic treatments as outpatient. -Improving since admission. Still has significant erythema and swelling  REVIEW OF SYSTEMS:  Review of Systems  Constitutional: Negative for chills, fever and malaise/fatigue.  HENT: Negative for ear discharge, ear pain and tinnitus.   Eyes: Negative for blurred vision and double vision.  Respiratory: Negative for cough, shortness of breath and wheezing.   Cardiovascular: Negative for chest pain, palpitations and leg swelling.  Gastrointestinal: Negative for abdominal pain, constipation, diarrhea, nausea and vomiting.  Genitourinary: Negative for dysuria and urgency.  Musculoskeletal: Negative for myalgias.  Neurological: Negative for dizziness, sensory change, speech change, focal weakness, seizures and headaches.  Psychiatric/Behavioral: Negative for depression.       Tearful as his wife is going to hospice home today    DRUG ALLERGIES:   Allergies  Allergen Reactions  . Actos  [Pioglitazone]     VITALS:  Blood pressure 117/77, pulse 85, temperature 97.4 F (36.3 C), temperature source Oral, resp. rate 17, height 5\' 8"  (1.727 m), weight 104 kg (229 lb 4 oz), SpO2 99 %.  PHYSICAL EXAMINATION:  Physical Exam  GENERAL:  70 y.o.-year-old patient lying in the bed with no acute distress.  EYES: Pupils equal, round, reactive to light and accommodation. No scleral icterus. Extraocular muscles intact.  HEENT: Head atraumatic, normocephalic. Oropharynx and nasopharynx clear.  NECK:  Supple, no jugular venous distention. No thyroid enlargement, no tenderness.  LUNGS: Normal breath sounds bilaterally, no wheezing, rales,rhonchi or crepitation. No use of accessory  muscles of respiration.  CARDIOVASCULAR: S1, S2 normal. No murmurs, rubs, or gallops.  ABDOMEN: Soft, nontender, nondistended. Bowel sounds present. No organomegaly or mass.  EXTREMITIES: No pedal edema, cyanosis, or clubbing.  NEUROLOGIC: Cranial nerves II through XII are intact. Muscle strength 5/5 in all extremities. Sensation intact. Gait not checked.  PSYCHIATRIC: The patient is alert and oriented x 3.  SKIN: No obvious rash, lesion, or ulcer.    LABORATORY PANEL:   CBC  Recent Labs Lab 10/10/16 0521  WBC 6.9  HGB 13.4  HCT 38.7*  PLT 197   ------------------------------------------------------------------------------------------------------------------  Chemistries   Recent Labs Lab 10/09/16 1809 10/10/16 0521  NA 138 139  K 4.0 3.6  CL 103 106  CO2 28 26  GLUCOSE 138* 68  BUN 16 13  CREATININE 0.68 0.52*  CALCIUM 9.5 8.9  AST 18  --   ALT 15*  --   ALKPHOS 71  --   BILITOT 0.8  --    ------------------------------------------------------------------------------------------------------------------  Cardiac Enzymes No results for input(s): TROPONINI in the last 168 hours. ------------------------------------------------------------------------------------------------------------------  RADIOLOGY:  No results found.  EKG:   Orders placed or performed during the hospital encounter of 03/28/16  . EKG 12-Lead  . EKG 12-Lead    ASSESSMENT AND PLAN:   70 year old male with past medical history significant for diabetes mellitus, atrial fibrillation on Xarelto, sleep apnea, hypertension presents to the hospital secondary to worsening right arm cellulitis.  #1 right arm cellulitis-open wound at the elbow. -Blood cultures are pending. History of MRSA infection in the past. Continue vancomycin. Can discontinue Zosyn. -Keep the arm elevated. Improving cellulitis. -Failed outpatient treatment with Keflex and also doxycycline  #2 diabetes mellitus-sugars have  been on the lower side since admission. Hold in [place] and high doses of Levemir. Continue sliding scale insulin. -Check A1c  #3 hypertension-continue lisinopril, likely renal protection for his diabetes  #4 atrial fibrillation-rate controlled. Continue on Xarelto  #5 DVT prophylaxis-already on Xarelto     All the records are reviewed and case discussed with Care Management/Social Workerr. Management plans discussed with the patient, family and they are in agreement.  CODE STATUS: Full Code  TOTAL TIME TAKING CARE OF THIS PATIENT: 37 minutes.   POSSIBLE D/C TOMORROW, DEPENDING ON CLINICAL CONDITION.   [name] Lighter M.D on 10/10/2016 at 2:40 PM  Between 7am to 6pm - Pager - [phone]  After 6pm go to [email or web address] - password [email or web address] [facility]  Office  [phone]  CC: Primary care physician; Lelon Huh, MD

## 2016-10-10 NOTE — Progress Notes (Signed)
CH responded to an OR for and AD. Pt was awake and alert. Pt stated that he already had an AD. CH briefly discussed the information to make certain we were talking about the same thing. Seeing that he was informed, I thanked him for the opportunity to visit.  CH is available for follow up as needed.    10/10/16 0900  Clinical Encounter Type  Visited With Patient  Visit Type Initial;Spiritual support;Social support  Referral From Nurse  Spiritual Encounters  Spiritual Needs Literature;Brochure

## 2016-10-10 NOTE — Care Management Obs Status (Signed)
[facility] NOTIFICATION   Patient Details  Name: Sean Hayden MRN: [id number] Date of Birth: October 22, 1946   Medicare Observation Status Notification Given:  Yes No patient signature, as pt. Is in isolation.    Beau Fanny, RN 10/10/2016, 9:11 AM

## 2016-10-10 NOTE — Progress Notes (Signed)
Inpatient Diabetes Program Recommendations  AACE/ADA: New Consensus Statement on Inpatient Glycemic Control (2015)  Target Ranges:  Prepandial:   less than 140 mg/dL      Peak postprandial:   less than 180 mg/dL (1-2 hours)      Critically ill patients:  140 - 180 mg/dL   Lab Results  Component Value Date   GLUCAP 76 10/10/2016   HGBA1C 6.1 05/28/2016    Review of Glycemic Control  Results for Sean Hayden, Sean Hayden (MRN [id number]) as of 10/10/2016 09:48  Ref. Range 10/09/2016 23:44 10/10/2016 07:40  Glucose-Capillary Latest Ref Range: 65 - 99 mg/dL 143 (H) 76    Diabetes history: Type 2 Outpatient Diabetes medications: Levemir 40 units qam, 30 units qpm Current orders for Inpatient glycemic control: Novolog sensitive correction tid, Novolog 0-5 units qhs  Inpatient Diabetes Program Recommendations:    Per ADA recommendations "consider performing an A1C on all patients with diabetes or hyperglycemia admitted to the hospital if not performed in the prior 3 months".   Gentry Fitz, RN, BA, MHA, CDE Diabetes Coordinator Inpatient Glycemic Control Team [phone] (Team Pager) [phone] ([place]) 10/10/2016 9:49 AM

## 2016-10-10 NOTE — Plan of Care (Signed)
Problem: Education: Goal: Knowledge of [facility] Education information/materials will improve Outcome: Progressing Pt likes to be called Deveron Furlong  Past Medical History:  Diagnosis Date  . B12 deficiency   . Diabetes mellitus without complication ([place])   . Diverticulosis   . Gout   . Gouty arthropathy   . Hemorrhoids   . History of right-sided carotid endarterectomy 04/17/12  . Hyperplastic colon polyp   . Hypertension   . Kidney stones   . MRSA infection   . Nephrolithiasis   . Neuropathy ([place])   . OSA (obstructive sleep apnea)   . Personal history of urinary calculi 05/25/2015   Pt is well controlled with home medications

## 2016-10-10 NOTE — Progress Notes (Signed)
Pharmacy Antibiotic Note  ELAZAR BETHKE is a 70 y.o. male admitted on 10/09/2016 with cellulitis.  Pharmacy has been consulted for vancomycin and Zosyn dosing.  Plan: DW 83kg  Vd 58L kei 0.087 hr-1  T1/2 8 hours Vancomycin 1 gram q 8 hours ordered with stacked dosing. Level before 5th dose. Goal trough 15-20.   Zosyn 4.5 grams q 8 hours ordered for Pseudomonas risk of recent abx usage.  Height: 5\' 8"  (172.7 cm) Weight: 229 lb 4 oz (104 kg) IBW/kg (Calculated) : 68.4  Temp (24hrs), Avg:97.8 F (36.6 C), Min:97.6 F (36.4 C), Max:98 F (36.7 C)   Recent Labs Lab 10/09/16 1809 10/09/16 2042  WBC 8.0  --   CREATININE 0.68  --   LATICACIDVEN  --  0.9    Estimated Creatinine Clearance: 100.4 mL/min (by C-G formula based on SCr of 0.68 mg/dL).    Allergies  Allergen Reactions  . Actos  [Pioglitazone]     Antimicrobials this admission: Vancomycin  11/1 >>  Zosyn 11/2  >>   Dose adjustments this admission:   Microbiology results: 11/1 BCx: pending    Thank you for allowing pharmacy to be a part of this patient's care.  Akeyla Molden S 10/10/2016 12:25 AM

## 2016-10-10 NOTE — Progress Notes (Signed)
Pt was alert and sitting up with family members present. Pt's wife was moved from next door room to Hospice today.  CH offered words of comfort and prayer.   10/10/16 2000  Clinical Encounter Type  Visited With Patient and family together  Visit Type Initial;Spiritual support  Referral From Nurse  Spiritual Encounters  Spiritual Needs Prayer;Emotional  Stress Factors  Patient Stress Factors None identified  Family Stress Factors Loss

## 2016-10-11 ENCOUNTER — Ambulatory Visit: Payer: Commercial Managed Care - HMO | Admitting: Family Medicine

## 2016-10-11 DIAGNOSIS — I10 Essential (primary) hypertension: Secondary | ICD-10-CM | POA: Diagnosis not present

## 2016-10-11 DIAGNOSIS — L03113 Cellulitis of right upper limb: Secondary | ICD-10-CM | POA: Diagnosis not present

## 2016-10-11 DIAGNOSIS — E119 Type 2 diabetes mellitus without complications: Secondary | ICD-10-CM | POA: Diagnosis not present

## 2016-10-11 DIAGNOSIS — I480 Paroxysmal atrial fibrillation: Secondary | ICD-10-CM | POA: Diagnosis not present

## 2016-10-11 LAB — VANCOMYCIN, TROUGH: VANCOMYCIN TR: 14 ug/mL — AB (ref 15–20)

## 2016-10-11 LAB — GLUCOSE, CAPILLARY: GLUCOSE-CAPILLARY: 143 mg/dL — AB (ref 65–99)

## 2016-10-11 LAB — HEMOGLOBIN A1C
Hgb A1c MFr Bld: 6.6 % — ABNORMAL HIGH (ref 4.8–5.6)
MEAN PLASMA GLUCOSE: 143 mg/dL

## 2016-10-11 MED ORDER — INSULIN DETEMIR 100 UNIT/ML ~~LOC~~ SOLN: 30.0000 [IU] | Freq: Two times a day (BID) | SUBCUTANEOUS | 1 refills | Status: DC

## 2016-10-11 MED ORDER — TRAMADOL HCL 50 MG PO TABS: 50.0000 mg | ORAL_TABLET | Freq: Three times a day (TID) | ORAL | 0 refills | Status: DC | PRN

## 2016-10-11 MED ORDER — LISINOPRIL 2.5 MG PO TABS: 2.5000 mg | ORAL_TABLET | Freq: Every day | ORAL | 2 refills | Status: DC

## 2016-10-11 MED ORDER — SULFAMETHOXAZOLE-TRIMETHOPRIM 800-160 MG PO TABS: 1.0000 | ORAL_TABLET | Freq: Two times a day (BID) | ORAL | 0 refills | Status: DC

## 2016-10-11 MED ORDER — INSULIN DETEMIR 100 UNIT/ML ~~LOC~~ SOLN: 20.0000 [IU] | Freq: Two times a day (BID) | SUBCUTANEOUS | 1 refills | Status: DC

## 2016-10-11 NOTE — Progress Notes (Signed)
Pharmacy Antibiotic Note  Sean Hayden is a 70 y.o. male admitted on 10/09/2016 with cellulitis.  Pharmacy has been consulted for vancomycin and Zosyn dosing.  Plan: DW 83kg  Vd 58L kei 0.087 hr-1  T1/2 8 hours Vancomycin 1 gram q 8 hours ordered with stacked dosing. Level before 5th dose. Goal trough 15-20.   Zosyn 4.5 grams q 8 hours ordered for Pseudomonas risk of recent abx usage.  Height: 5\' 8"  (172.7 cm) Weight: 229 lb 4 oz (104 kg) IBW/kg (Calculated) : 68.4  Temp (24hrs), Avg:98 F (36.7 C), Min:97.4 F (36.3 C), Max:98.7 F (37.1 C)   Recent Labs Lab 10/09/16 1809 10/09/16 2042 10/10/16 0521 10/11/16 0454  WBC 8.0  --  6.9  --   CREATININE 0.68  --  0.52*  --   LATICACIDVEN  --  0.9  --   --   VANCOTROUGH  --   --   --  14*    Estimated Creatinine Clearance: 100.4 mL/min (by C-G formula based on SCr of 0.52 mg/dL (L)).    Allergies  Allergen Reactions  . Actos  [Pioglitazone]     Antimicrobials this admission: Vancomycin  11/1 >>  Zosyn 11/2  >>   Dose adjustments this admission: 11/3 AM vanc trough 14. Per RN patient is to discharge today so will not change regimen for now. If patient Is to continue on vancomycin, recommend increasing dose to 1250 mg q 8 hours.   Microbiology results: 11/1 BCx: pending    Thank you for allowing pharmacy to be a part of this patient's care.  Demetrie Borge S 10/11/2016 5:34 AM

## 2016-10-11 NOTE — Discharge Summary (Signed)
Sean Hayden at [place] NAME: Sean Hayden    MR#:  [id number]  DATE OF BIRTH:  12-May-1946  DATE OF ADMISSION:  10/09/2016   ADMITTING PHYSICIAN: Sean Coon, MD  DATE OF DISCHARGE: 10/11/2016  9:41 AM  PRIMARY CARE PHYSICIAN: Sean Huh, MD   ADMISSION DIAGNOSIS:   Cellulitis of right upper extremity [id number]  DISCHARGE DIAGNOSIS:   Principal Problem:   Right arm cellulitis Active Problems:   Diabetes mellitus with neuropathy (HCC)   Hyperlipidemia   Hypertension   Paroxysmal atrial fibrillation ([place])   SECONDARY DIAGNOSIS:   Past Medical History:  Diagnosis Date  . B12 deficiency   . Diabetes mellitus without complication ([place])   . Diverticulosis   . Gout   . Gouty arthropathy   . Hemorrhoids   . History of right-sided carotid endarterectomy 04/17/12  . Hyperplastic colon polyp   . Hypertension   . Kidney stones   . MRSA infection   . Nephrolithiasis   . Neuropathy ([place])   . OSA (obstructive sleep apnea)   . Personal history of urinary calculi 05/25/2015    HOSPITAL COURSE:   70 year old male with past medical history significant for diabetes mellitus, atrial fibrillation on Xarelto, sleep apnea, hypertension presents to the hospital secondary to worsening right arm cellulitis.  #1Right arm cellulitis-open wound at the elbow. Known MRSA skin infections -Blood cultures are negative. History of MRSA infection in the past. Received vancomycin and Zosyn. - being discharged on bactrim -Keep the arm elevated. Improving cellulitis. Advised to use antiseptic soap for bathing -Failed outpatient treatment with Keflex and also doxycycline  #2 diabetes mellitus-sugars have been on the lower side since admission.  Decreased levemir dose -A1c is 6.6  #3 hypertension-continue lisinopril, likely renal protection for his diabetes  #4 atrial fibrillation-rate controlled. Continue on Xarelto  Being discharged home  today, able to ambulate well in the room  DISCHARGE CONDITIONS:   Guarded  CONSULTS OBTAINED:   None  DRUG ALLERGIES:   Allergies  Allergen Reactions  . Actos  [Pioglitazone]    DISCHARGE MEDICATIONS:     Medication List    STOP taking these medications   cephALEXin 500 MG capsule Commonly known as:  KEFLEX   doxycycline 100 MG tablet Commonly known as:  VIBRA-TABS   INVOKANA 300 MG Tabs tablet Generic drug:  canagliflozin   metFORMIN 850 MG tablet Commonly known as:  GLUCOPHAGE   SPECIALTY VITAMINS PRODUCTS PO   UNABLE TO FIND     TAKE these medications   acyclovir 200 MG capsule Commonly known as:  ZOVIRAX Take by mouth as needed. Reported on 04/10/2016   atorvastatin 20 MG tablet Commonly known as:  LIPITOR Take 20 mg by mouth.   capsaicin 0.025 % cream Commonly known as:  ZOSTRIX Apply topically as needed.   Fish Oil 1200 MG Caps Take by mouth.   Flaxseed Oil 1000 MG Caps Take 1,000 mg by mouth 2 (two) times daily.   fluticasone 50 MCG/ACT nasal spray Commonly known as:  FLONASE Place into the nose.   gabapentin 100 MG capsule Commonly known as:  NEURONTIN Take 100 mg by mouth 3 (three) times daily.   indomethacin 75 MG CR capsule Commonly known as:  INDOCIN SR Take by mouth as needed.   insulin detemir 100 UNIT/ML injection Commonly known as:  LEVEMIR Inject 0.3 mLs (30 Units total) into the skin 2 (two) times daily. What changed:  how much to take  lisinopril 2.5 MG tablet Commonly known as:  PRINIVIL,ZESTRIL Take 1 tablet (2.5 mg total) by mouth daily. What changed:  medication strength  how much to take   loratadine 10 MG tablet Commonly known as:  CLARITIN Take by mouth daily.   neomycin-polymyxin-hydrocortisone otic solution Commonly known as:  CORTISPORIN Place 4 drops into the left ear 4 (four) times daily.   OSTEO BI-FLEX TRIPLE STRENGTH PO Take 1 tablet by mouth 2 (two) times daily.   PARoxetine 20 MG  tablet Commonly known as:  PAXIL Take 1 tablet (20 mg total) by mouth daily.   rivaroxaban 20 MG Tabs tablet Commonly known as:  XARELTO Take 1 tablet (20 mg total) by mouth daily with supper.   sulfamethoxazole-trimethoprim 800-160 MG tablet Commonly known as:  BACTRIM DS,SEPTRA DS Take 1 tablet by mouth 2 (two) times daily. X 7 more days   THERA-GESIC EX Apply topically as needed.   traMADol 50 MG tablet Commonly known as:  ULTRAM Take 1 tablet (50 mg total) by mouth every 8 (eight) hours as needed.   triamcinolone cream 0.5 % Commonly known as:  KENALOG TRIAMCINOLONE ACETONIDE, 0.5% (External Cream)  1 application to affected area wo times daily, as needed for 0 days  Quantity: 30;  Refills: 1   Ordered :14-Jan-2014  Sean Huh MD;  Started 14-Jan-2014 Active Comments: Medication taken as needed.   VITAMIN B 12 PO Take 1 tablet by mouth 2 (two) times daily.   VITAMIN D PO Take 1 tablet by mouth daily.   zolpidem 10 MG tablet Commonly known as:  AMBIEN TAKE 1/2 TO 1 TABLET AT BEDTIME        DISCHARGE INSTRUCTIONS:   1. PCP f/u in 1-2 weeks  DIET:   Cardiac diet  ACTIVITY:   Activity as tolerated  OXYGEN:   Home Oxygen: No.  Oxygen Delivery: room air  DISCHARGE LOCATION:   home   If you experience worsening of your admission symptoms, develop shortness of breath, life threatening emergency, suicidal or homicidal thoughts you must seek medical attention immediately by calling 911 or calling your MD immediately  if symptoms less severe.  You Must read complete instructions/literature along with all the possible adverse reactions/side effects for all the Medicines you take and that have been prescribed to you. Take any new Medicines after you have completely understood and accpet all the possible adverse reactions/side effects.   Please note  You were cared for by a hospitalist during your hospital stay. If you have any questions about your discharge  medications or the care you received while you were in the hospital after you are discharged, you can call the unit and asked to speak with the hospitalist on call if the hospitalist that took care of you is not available. Once you are discharged, your primary care physician will handle any further medical issues. Please note that NO REFILLS for any discharge medications will be authorized once you are discharged, as it is imperative that you return to your primary care physician (or establish a relationship with a primary care physician if you do not have one) for your aftercare needs so that they can reassess your need for medications and monitor your lab values.    On the day of Discharge:  VITAL SIGNS:   Blood pressure (!) 118/51, pulse 66, temperature 98.1 F (36.7 C), temperature source Oral, resp. rate 19, height 5\' 8"  (1.727 m), weight 104 kg (229 lb 4 oz), SpO2 96 %.  PHYSICAL  EXAMINATION:    GENERAL:  70 y.o.-year-old patient lying in the bed with no acute distress.  EYES: Pupils equal, round, reactive to light and accommodation. No scleral icterus. Extraocular muscles intact.  HEENT: Head atraumatic, normocephalic. Oropharynx and nasopharynx clear.  NECK:  Supple, no jugular venous distention. No thyroid enlargement, no tenderness.  LUNGS: Normal breath sounds bilaterally, no wheezing, rales,rhonchi or crepitation. No use of accessory muscles of respiration.  CARDIOVASCULAR: S1, S2 normal. No murmurs, rubs, or gallops.  ABDOMEN: Soft, nontender, nondistended. Bowel sounds present. No organomegaly or mass.  EXTREMITIES: No pedal edema, cyanosis, or clubbing. Much improved swelling of the right arm, some erythema of the forearm still present NEUROLOGIC: Cranial nerves II through XII are intact. Muscle strength 5/5 in all extremities. Sensation intact. Gait not checked.  PSYCHIATRIC: The patient is alert and oriented x 3.  SKIN: No obvious rash, lesion, or ulcer.   DATA REVIEW:    CBC  Recent Labs Lab 10/10/16 0521  WBC 6.9  HGB 13.4  HCT 38.7*  PLT 197    Chemistries   Recent Labs Lab 10/09/16 1809 10/10/16 0521  NA 138 139  K 4.0 3.6  CL 103 106  CO2 28 26  GLUCOSE 138* 68  BUN 16 13  CREATININE 0.68 0.52*  CALCIUM 9.5 8.9  AST 18  --   ALT 15*  --   ALKPHOS 71  --   BILITOT 0.8  --      Microbiology Results  Results for orders placed or performed during the hospital encounter of 10/09/16  Culture, blood (Routine X 2) w Reflex to ID Panel     Status: None (Preliminary result)   Collection Time: 10/09/16  8:42 PM  Result Value Ref Range Status   Specimen Description BLOOD RIGHT FOREARM  Final   Special Requests BOTTLES DRAWN AEROBIC AND ANAEROBIC [place]  Final   Culture NO GROWTH 2 DAYS  Final   Report Status PENDING  Incomplete  Culture, blood (Routine X 2) w Reflex to ID Panel     Status: None (Preliminary result)   Collection Time: 10/09/16  8:42 PM  Result Value Ref Range Status   Specimen Description BLOOD RIGHT AC  Final   Special Requests BOTTLES DRAWN AEROBIC AND ANAEROBIC [place] ANA2CC  Final   Culture NO GROWTH 2 DAYS  Final   Report Status PENDING  Incomplete    RADIOLOGY:  No results found.   Management plans discussed with the patient, family and they are in agreement.  CODE STATUS:     Code Status Orders        Start     Ordered   10/09/16 2332  Full code  Continuous     10/09/16 2331    Code Status History    Date Active Date Inactive Code Status Order ID Comments User Context   02/21/2016  8:17 AM 02/24/2016  3:24 PM Full Code [id number]  Juluis Mire, MD ED      TOTAL TIME TAKING CARE OF THIS PATIENT: 36 minutes.    [name] Lighter M.D on 10/11/2016 at 2:23 PM  Between 7am to 6pm - Pager - [phone]  After 6pm go to [email or web address] - Proofreader  [facility]  Office  [phone]  CC: Primary care physician; Sean Huh, MD   Note: This  dictation was prepared with Dragon dictation along with smaller phrase technology. Any transcriptional errors that result from this process are unintentional.

## 2016-10-14 LAB — CULTURE, BLOOD (ROUTINE X 2)
CULTURE: NO GROWTH
CULTURE: NO GROWTH

## 2016-10-21 ENCOUNTER — Ambulatory Visit (INDEPENDENT_AMBULATORY_CARE_PROVIDER_SITE_OTHER): Payer: Commercial Managed Care - HMO | Admitting: *Deleted

## 2016-10-21 DIAGNOSIS — I639 Cerebral infarction, unspecified: Secondary | ICD-10-CM

## 2016-10-21 NOTE — Progress Notes (Signed)
Carelink Summary Report / Loop Recorder 

## 2016-10-24 ENCOUNTER — Other Ambulatory Visit: Payer: Self-pay | Admitting: Family Medicine

## 2016-10-24 DIAGNOSIS — I480 Paroxysmal atrial fibrillation: Secondary | ICD-10-CM

## 2016-10-24 LAB — CUP PACEART REMOTE DEVICE CHECK
Implantable Pulse Generator Implant Date: 20170317
MDC IDC SESS DTM: 20171116091639

## 2016-10-24 MED ORDER — RIVAROXABAN 20 MG PO TABS: 20.0000 mg | ORAL_TABLET | Freq: Every day | ORAL | 0 refills | Status: DC

## 2016-10-24 NOTE — Telephone Encounter (Signed)
Pt normally gets the Rx rivaroxaban (XARELTO) 20 MG TABS tablet from the [place].  Pt was late ordering this and is almost out of medication.  Can 10 pills be sent to the local pharmacy, [facility]?  [id number]

## 2016-10-24 NOTE — Telephone Encounter (Signed)
rx has been sent to [facility] pharmacy

## 2016-10-24 NOTE — Telephone Encounter (Signed)
Patient has been advised. KW 

## 2016-10-24 NOTE — Telephone Encounter (Signed)
Please advise 

## 2016-11-06 ENCOUNTER — Encounter (HOSPITAL_COMMUNITY): Payer: Self-pay | Admitting: Emergency Medicine

## 2016-11-06 ENCOUNTER — Emergency Department (HOSPITAL_COMMUNITY): Payer: Commercial Managed Care - HMO

## 2016-11-06 DIAGNOSIS — R531 Weakness: Secondary | ICD-10-CM | POA: Insufficient documentation

## 2016-11-06 DIAGNOSIS — R200 Anesthesia of skin: Secondary | ICD-10-CM | POA: Diagnosis not present

## 2016-11-06 DIAGNOSIS — Z794 Long term (current) use of insulin: Secondary | ICD-10-CM | POA: Diagnosis not present

## 2016-11-06 DIAGNOSIS — E119 Type 2 diabetes mellitus without complications: Secondary | ICD-10-CM | POA: Insufficient documentation

## 2016-11-06 DIAGNOSIS — Z8673 Personal history of transient ischemic attack (TIA), and cerebral infarction without residual deficits: Secondary | ICD-10-CM | POA: Diagnosis not present

## 2016-11-06 DIAGNOSIS — I6789 Other cerebrovascular disease: Secondary | ICD-10-CM | POA: Diagnosis not present

## 2016-11-06 DIAGNOSIS — Z87891 Personal history of nicotine dependence: Secondary | ICD-10-CM | POA: Diagnosis not present

## 2016-11-06 DIAGNOSIS — Z79899 Other long term (current) drug therapy: Secondary | ICD-10-CM | POA: Insufficient documentation

## 2016-11-06 DIAGNOSIS — G459 Transient cerebral ischemic attack, unspecified: Secondary | ICD-10-CM | POA: Insufficient documentation

## 2016-11-06 DIAGNOSIS — Z7901 Long term (current) use of anticoagulants: Secondary | ICD-10-CM | POA: Diagnosis not present

## 2016-11-06 LAB — BASIC METABOLIC PANEL
ANION GAP: 8 (ref 5–15)
BUN: 10 mg/dL (ref 6–20)
CHLORIDE: 104 mmol/L (ref 101–111)
CO2: 26 mmol/L (ref 22–32)
Calcium: 9.3 mg/dL (ref 8.9–10.3)
Creatinine, Ser: 0.73 mg/dL (ref 0.61–1.24)
Glucose, Bld: 162 mg/dL — ABNORMAL HIGH (ref 65–99)
POTASSIUM: 4.1 mmol/L (ref 3.5–5.1)
SODIUM: 138 mmol/L (ref 135–145)

## 2016-11-06 LAB — CBC
HEMATOCRIT: 42.3 % (ref 39.0–52.0)
HEMOGLOBIN: 14.3 g/dL (ref 13.0–17.0)
MCH: 31 pg (ref 26.0–34.0)
MCHC: 33.8 g/dL (ref 30.0–36.0)
MCV: 91.8 fL (ref 78.0–100.0)
Platelets: 166 10*3/uL (ref 150–400)
RBC: 4.61 MIL/uL (ref 4.22–5.81)
RDW: 13.6 % (ref 11.5–15.5)
WBC: 7.4 10*3/uL (ref 4.0–10.5)

## 2016-11-06 LAB — I-STAT TROPONIN, ED: Troponin i, poc: 0 ng/mL (ref 0.00–0.08)

## 2016-11-06 NOTE — ED Triage Notes (Addendum)
Pt. reports brief central chest discomfort " feels hot" radiating to left neck with mild left forearm numbness this evening , denies SOB / no nausea or diaphoresis . Pt. took ASA 324 mg prior to arrival .

## 2016-11-07 ENCOUNTER — Emergency Department (HOSPITAL_COMMUNITY): Payer: Commercial Managed Care - HMO

## 2016-11-07 ENCOUNTER — Emergency Department (HOSPITAL_COMMUNITY)
Admission: EM | Admit: 2016-11-07 | Discharge: 2016-11-07 | Disposition: A | Discharge status: Home / Self Care | Payer: Commercial Managed Care - HMO | Attending: Emergency Medicine | Admitting: Emergency Medicine

## 2016-11-07 DIAGNOSIS — R531 Weakness: Secondary | ICD-10-CM

## 2016-11-07 DIAGNOSIS — R200 Anesthesia of skin: Secondary | ICD-10-CM | POA: Diagnosis not present

## 2016-11-07 DIAGNOSIS — G459 Transient cerebral ischemic attack, unspecified: Secondary | ICD-10-CM

## 2016-11-07 LAB — COMPREHENSIVE METABOLIC PANEL
ALBUMIN: 3.5 g/dL (ref 3.5–5.0)
ALK PHOS: 68 U/L (ref 38–126)
ALT: 17 U/L (ref 17–63)
ANION GAP: 8 (ref 5–15)
AST: 18 U/L (ref 15–41)
BILIRUBIN TOTAL: 0.6 mg/dL (ref 0.3–1.2)
BUN: 9 mg/dL (ref 6–20)
CALCIUM: 9.1 mg/dL (ref 8.9–10.3)
CO2: 27 mmol/L (ref 22–32)
CREATININE: 0.71 mg/dL (ref 0.61–1.24)
Chloride: 104 mmol/L (ref 101–111)
GFR calc Af Amer: >60 mL/min (ref >60)
GFR calc non Af Amer: >60 mL/min (ref >60)
GLUCOSE: 134 mg/dL — AB (ref 65–99)
Potassium: 4 mmol/L (ref 3.5–5.1)
SODIUM: 139 mmol/L (ref 135–145)
TOTAL PROTEIN: 6.5 g/dL (ref 6.5–8.1)

## 2016-11-07 LAB — DIFFERENTIAL
Basophils Absolute: 0 10*3/uL (ref 0.0–0.1)
Basophils Relative: 0 %
EOS PCT: 3 %
Eosinophils Absolute: 0.2 10*3/uL (ref 0.0–0.7)
LYMPHS ABS: 1.9 10*3/uL (ref 0.7–4.0)
LYMPHS PCT: 27 %
Monocytes Absolute: 0.6 10*3/uL (ref 0.1–1.0)
Monocytes Relative: 9 %
NEUTROS ABS: 4.1 10*3/uL (ref 1.7–7.7)
NEUTROS PCT: 61 %

## 2016-11-07 LAB — RAPID URINE DRUG SCREEN, HOSP PERFORMED
AMPHETAMINES: NOT DETECTED
BARBITURATES: NOT DETECTED
Benzodiazepines: NOT DETECTED
Cocaine: NOT DETECTED
Opiates: NOT DETECTED
TETRAHYDROCANNABINOL: NOT DETECTED

## 2016-11-07 LAB — I-STAT TROPONIN, ED: Troponin i, poc: 0 ng/mL (ref 0.00–0.08)

## 2016-11-07 LAB — I-STAT CHEM 8, ED
BUN: 10 mg/dL (ref 6–20)
CALCIUM ION: 1.19 mmol/L (ref 1.15–1.40)
CHLORIDE: 99 mmol/L — AB (ref 101–111)
CREATININE: 0.7 mg/dL (ref 0.61–1.24)
GLUCOSE: 135 mg/dL — AB (ref 65–99)
HCT: 40 % (ref 39.0–52.0)
Hemoglobin: 13.6 g/dL (ref 13.0–17.0)
Potassium: 4 mmol/L (ref 3.5–5.1)
Sodium: 141 mmol/L (ref 135–145)
TCO2: 28 mmol/L (ref 0–100)

## 2016-11-07 LAB — APTT: aPTT: 38 seconds — ABNORMAL HIGH (ref 24–36)

## 2016-11-07 LAB — PROTIME-INR
INR: 1.7
Prothrombin Time: 20.1 seconds — ABNORMAL HIGH (ref 11.4–15.2)

## 2016-11-07 LAB — ETHANOL

## 2016-11-07 NOTE — ED Provider Notes (Signed)
[facility] DEPT Provider Note   CSN: [id number] Arrival date & time: 11/06/16  2224   By signing my name below, I, Delton Prairie, attest that this documentation has been prepared under the direction and in the presence of Everlene Balls, MD  Electronically Signed: Delton Prairie, ED Scribe. 11/07/16. 3:34 AM.   History   Chief Complaint Chief Complaint  Patient presents with  . Numbness   The history is provided by the patient. No language interpreter was used.   HPI Comments:  Sean Hayden is a 70 y.o. male, with a hx of A-Fib, DM, neuropathy, who presents to the Emergency Department complaining of sudden osnet left hand numbness which began while he was eating dinner around 9:30 PM yesterday. He notes associated left sided neck numbness, bilateral lower extremity weakness and rhinorrhea. He states his symptoms resided after about 10 minutes. Pt states he took 324 mg of ASA prior to arrival with no relief. Pt denies facial droop, chest pain, any current symptoms, any other associated symptoms and modifying factors at this time. He is on Xarelto.   Past Medical History:  Diagnosis Date  . B12 deficiency   . Diabetes mellitus without complication ([place])   . Diverticulosis   . Gout   . Gouty arthropathy   . Hemorrhoids   . History of right-sided carotid endarterectomy 04/17/12  . Hyperplastic colon polyp   . Hypertension   . Kidney stones   . MRSA infection   . Nephrolithiasis   . Neuropathy ([place])   . OSA (obstructive sleep apnea)   . Personal history of urinary calculi 05/25/2015    Patient Active Problem List   Diagnosis Date Noted  . Right arm cellulitis 10/09/2016  . Situational stress 04/16/2016  . Paroxysmal atrial fibrillation ([place]) 03/26/2016  . Vitamin D deficiency 02/28/2016  . Aortic valve stenosis, congenital   . History of CVA (cerebrovascular accident) without residual deficits 02/22/2016  . Diabetes mellitus with neuropathy ([place]) 02/21/2016  .  Hyperlipidemia 02/21/2016  . Hypertension 02/21/2016  . History of right-sided carotid endarterectomy 02/21/2016  . Allergic rhinitis 02/21/2016  . B12 deficiency 12/14/2015  . Arthritis 05/25/2015  . Diverticulosis of colon 05/25/2015  . Edema 05/25/2015  . Obstructive sleep apnea of adult 05/25/2015  . Psoriasis 05/25/2015  . RAD (reactive airway disease) 05/25/2015  . TIA (transient ischemic attack) 05/25/2015  . Carotid artery disease ([place]) 03/10/2012  . Obesity 01/18/2008  . Insomnia 12/09/2006  . Gouty arthritis 02/07/1999    Past Surgical History:  Procedure Laterality Date  . CAROTID ARTERY ANGIOPLASTY Right 04/17/2012   Dr. Delana Meyer, [facility]  . CAROTID ENDARTERECTOMY Right 04/17/2012   Dr. Delana Meyer, [facility]  . CHOLECYSTECTOMY  2005   Lap  . CHOLECYSTECTOMY  2005   lap  . EP IMPLANTABLE DEVICE N/A 02/23/2016   Procedure: Loop Recorder Insertion;  Surgeon: Thompson Grayer, MD;  Location: [facility] LAB;  Service: Cardiovascular;  Laterality: N/A;  . HEMORRHOID SURGERY  2009  . TEE WITHOUT CARDIOVERSION N/A 02/23/2016   Procedure: TRANSESOPHAGEAL ECHOCARDIOGRAM (TEE);  Surgeon: Pixie Casino, MD;  Location: [facility] ENDOSCOPY;  Service: Cardiovascular;  Laterality: N/A;  ALSO GETTING A LOOP       Home Medications    Prior to Admission medications   Medication Sig Start Date End Date Taking? Authorizing Provider  atorvastatin (LIPITOR) 20 MG tablet Take 20 mg by mouth every evening.  11/01/14  Yes Historical Provider, MD  Cholecalciferol (VITAMIN D PO) Take 1 tablet by mouth  daily.   Yes Historical Provider, MD  Cyanocobalamin (VITAMIN B 12 PO) Take 1 tablet by mouth 2 (two) times daily.   Yes Historical Provider, MD  Flaxseed, Linseed, (FLAXSEED OIL) 1000 MG CAPS Take 1,000 mg by mouth 2 (two) times daily.   Yes Historical Provider, MD  fluticasone (FLONASE) 50 MCG/ACT nasal spray Place 2 sprays into the nose daily.  03/21/12  Yes Historical Provider, MD  gabapentin (NEURONTIN)  100 MG capsule Take 100 mg by mouth 3 (three) times daily.   Yes Historical Provider, MD  insulin detemir (LEVEMIR) 100 UNIT/ML injection Inject 0.3 mLs (30 Units total) into the skin 2 (two) times daily. 10/11/16  Yes [name] Lighter, MD  lisinopril (PRINIVIL,ZESTRIL) 2.5 MG tablet Take 1 tablet (2.5 mg total) by mouth daily. 10/11/16  Yes [name] Lighter, MD  loratadine (CLARITIN) 10 MG tablet Take 10 mg by mouth daily.  03/24/12  Yes Historical Provider, MD  Misc Natural Products (OSTEO BI-FLEX TRIPLE STRENGTH PO) Take 1 tablet by mouth 2 (two) times daily.   Yes Historical Provider, MD  Omega-3 Fatty Acids (FISH OIL) 1200 MG CAPS Take 1,200 mg by mouth daily.    Yes Historical Provider, MD  PARoxetine (PAXIL) 20 MG tablet Take 1 tablet (20 mg total) by mouth daily. 05/13/16  Yes Birdie Sons, MD  rivaroxaban (XARELTO) 20 MG TABS tablet Take 1 tablet (20 mg total) by mouth daily with supper. 10/24/16  Yes Birdie Sons, MD  zolpidem (AMBIEN) 10 MG tablet TAKE 1/2 TO 1 TABLET AT BEDTIME 10/01/16  Yes Birdie Sons, MD  neomycin-polymyxin-hydrocortisone (CORTISPORIN) otic solution Place 4 drops into the left ear 4 (four) times daily. Patient not taking: Reported on 11/07/2016 04/10/16   Carmon Ginsberg, PA  sulfamethoxazole-trimethoprim (BACTRIM DS,SEPTRA DS) 800-160 MG tablet Take 1 tablet by mouth 2 (two) times daily. X 7 more days Patient not taking: Reported on 11/07/2016 10/11/16   [name] Lighter, MD  traMADol (ULTRAM) 50 MG tablet Take 1 tablet (50 mg total) by mouth every 8 (eight) hours as needed. Patient not taking: Reported on 11/07/2016 10/11/16   [name] Lighter, MD    Family History Family History  Problem Relation Age of Onset  . Hodgkin's lymphoma Father     Social History Social History  Substance Use Topics  . Smoking status: Former Smoker    Types: Cigars    Quit date: 04/08/2012  . Smokeless tobacco: Former Systems developer     Comment: cigarettes and cigars  . Alcohol use  No     Allergies   Actos  [pioglitazone]   Review of Systems Review of Systems  HENT: Positive for rhinorrhea.   Cardiovascular: Negative for chest pain.  Neurological: Positive for weakness and numbness. Negative for facial asymmetry.  10 Systems reviewed and are negative for acute change except as noted in the HPI.  Physical Exam Updated Vital Signs BP 123/71   Pulse 68   Temp 98 F (36.7 C) (Oral)   Resp 16   Ht 5\' 7"  (1.702 m)   Wt 233 lb (105.7 kg)   SpO2 97%   BMI 36.49 kg/m   Physical Exam  Constitutional: He is oriented to person, place, and time. Vital signs are normal. He appears well-developed and well-nourished.  Non-toxic appearance. He does not appear ill. No distress.  HENT:  Head: Normocephalic and atraumatic.  Nose: Nose normal.  Mouth/Throat: Oropharynx is clear and moist. No oropharyngeal exudate.  Eyes: Conjunctivae and EOM are normal. Pupils are  equal, round, and reactive to light. No scleral icterus.  Neck: Normal range of motion. Neck supple. No tracheal deviation, no edema, no erythema and normal range of motion present. No thyroid mass and no thyromegaly present.  Cardiovascular: Normal rate, regular rhythm, S1 normal, S2 normal, normal heart sounds, intact distal pulses and normal pulses.  Exam reveals no gallop and no friction rub.   No murmur heard. Pulmonary/Chest: Effort normal and breath sounds normal. No respiratory distress. He has no wheezes. He has no rhonchi. He has no rales.  Abdominal: Soft. Normal appearance and bowel sounds are normal. He exhibits no distension, no ascites and no mass. There is no hepatosplenomegaly. There is no tenderness. There is no rebound, no guarding and no CVA tenderness.  Musculoskeletal: Normal range of motion. He exhibits no edema or tenderness.  Lymphadenopathy:    He has no cervical adenopathy.  Neurological: He is alert and oriented to person, place, and time. He has normal strength. No cranial nerve  deficit or sensory deficit.  Normal strength and sensation in all extremities. Normal cerebellar testing.   Skin: Skin is warm, dry and intact. No petechiae and no rash noted. He is not diaphoretic. No erythema. No pallor.  Nursing note and vitals reviewed.    ED Treatments / Results  DIAGNOSTIC STUDIES:  Oxygen Saturation is 96% on RA, normal by my interpretation.    COORDINATION OF CARE:  3:26 AM Discussed treatment plan with pt at bedside and pt agreed to plan.  Labs (all labs ordered are listed, but only abnormal results are displayed) Labs Reviewed  BASIC METABOLIC PANEL - Abnormal; Notable for the following:       Result Value   Glucose, Bld 162 (*)    All other components within normal limits  PROTIME-INR - Abnormal; Notable for the following:    Prothrombin Time 20.1 (*)    All other components within normal limits  APTT - Abnormal; Notable for the following:    aPTT 38 (*)    All other components within normal limits  COMPREHENSIVE METABOLIC PANEL - Abnormal; Notable for the following:    Glucose, Bld 134 (*)    All other components within normal limits  I-STAT CHEM 8, ED - Abnormal; Notable for the following:    Chloride 99 (*)    Glucose, Bld 135 (*)    All other components within normal limits  CBC  ETHANOL  DIFFERENTIAL  RAPID URINE DRUG SCREEN, HOSP PERFORMED  I-STAT TROPOININ, ED  I-STAT TROPOININ, ED    EKG  EKG Interpretation  Date/Time:  Wednesday November 06 2016 22:38:19 EST Ventricular Rate:  76 PR Interval:  162 QRS Duration: 92 QT Interval:  388 QTC Calculation: 436 R Axis:   76 Text Interpretation:  Normal sinus rhythm Premature atrial complexes Interpretation limited secondary to artifact PACS are new Confirmed by Glynn Octave [phone]) on 11/07/2016 4:44:22 AM       Radiology Dg Chest 2 View  Result Date: 11/06/2016 CLINICAL DATA:  Left arm numbness prior to stroke EXAM: CHEST  2 VIEW COMPARISON:  None. FINDINGS: The  heart size and mediastinal contours are within normal limits. Both lungs are clear. The visualized skeletal structures are unremarkable. Left anterior implantable cardiac monitoring device is seen projecting over the left lung base. IMPRESSION: No active cardiopulmonary disease. Implantable left-sided anterior chest wall cardiac monitoring device noted. Electronically Signed   By: Ashley Royalty M.D.   On: 11/06/2016 23:21    Procedures Procedures (including  critical care time)  Medications Ordered in ED Medications - No data to display   Initial Impression / Assessment and Plan / ED Course  I have reviewed the triage vital signs and the nursing notes.  Pertinent labs & imaging results that were available during my care of the patient were reviewed by me and considered in my medical decision making (see chart for details).  Clinical Course     Patient presents to the ED for numbness he states is consistent with prior stroke.  All symptoms have now resolved. I have concern for possible stroke.  Will obtain MRI for further evaluation.    6:08 AM MRI is still pending.  Will sign out to oncoming provider for results.  If there is an acute stroke, patient will require admission.  If not, he is safe for DC and fu with neurology.  He is requesting a neurology referral.  Final Clinical Impressions(s) / ED Diagnoses   Final diagnoses:  None    New Prescriptions New Prescriptions   No medications on file    I personally performed the services described in this documentation, which was scribed in my presence. The recorded information has been reviewed and is accurate.       Everlene Balls, MD 11/07/16 [phone]

## 2016-11-07 NOTE — ED Provider Notes (Signed)
MRI shows no acute infarct.  Pt has remained symptom free while here.  He will be d/c home and instructed to f/u with neurology.   Isla Pence, MD 11/07/16 0830

## 2016-11-12 ENCOUNTER — Ambulatory Visit (INDEPENDENT_AMBULATORY_CARE_PROVIDER_SITE_OTHER): Payer: 59 | Admitting: Neurology

## 2016-11-12 ENCOUNTER — Encounter: Payer: Self-pay | Admitting: Neurology

## 2016-11-12 VITALS — BP 133/78 | HR 71 | Ht 67.0 in | Wt 233.5 lb

## 2016-11-12 DIAGNOSIS — G459 Transient cerebral ischemic attack, unspecified: Secondary | ICD-10-CM | POA: Diagnosis not present

## 2016-11-12 DIAGNOSIS — I480 Paroxysmal atrial fibrillation: Secondary | ICD-10-CM

## 2016-11-12 DIAGNOSIS — I779 Disorder of arteries and arterioles, unspecified: Secondary | ICD-10-CM | POA: Diagnosis not present

## 2016-11-12 DIAGNOSIS — G4733 Obstructive sleep apnea (adult) (pediatric): Secondary | ICD-10-CM

## 2016-11-12 DIAGNOSIS — I739 Peripheral vascular disease, unspecified: Secondary | ICD-10-CM

## 2016-11-12 MED ORDER — DIVALPROEX SODIUM ER 500 MG PO TB24: 500.0000 mg | ORAL_TABLET | Freq: Every day | ORAL | 11 refills | Status: DC

## 2016-11-12 MED ORDER — DIVALPROEX SODIUM ER 500 MG PO TB24: 500.0000 mg | ORAL_TABLET | Freq: Every day | ORAL | 4 refills | Status: DC

## 2016-11-12 NOTE — Progress Notes (Signed)
PATIENT: Sean Hayden DOB: 09/11/1946  Chief Complaint  Patient presents with  . Left-sided numbness    He is here with is son-in-law, Sean Hayden.  Reports having an episode of left-sided weakness and numbness on 11/07/16.  He went to the ED for treatment and would like to review his MRI.  He has a past history of CVA.  [name] PCP    Birdie Sons, MD, referred from ED     Sean Hayden is a 70 years old right-handed male, accompanied by his son-in-law, seen in refer by his primary care Dr. Lelon Huh for evaluation of stroke, initial evaluation was November 12 2016  He had a past medical history of diabetes, atrial fibrillation, hyperlipidemia, hypertension, is on chronic anticoagulation Xarelto treatments, he had history of right carotid artery endartectomy, stroke in the past,  He smokes. 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  On Oct 11, 2016, he had acute onset of left hand numbness, traveling to his left elbow, left shoulder, left upper and lower lip, he had difficulty talking, but denies confusion, difficulty using his left hand, symptoms last about 5 minutes.  He had a very similar episode in March 2017  In between episodes, he denies visual loss, dysarthria, gait abnormality, weakness.  We have personally reviewed MRI of the brain without contrast in November 2017, no acute abnormality, there was extensive supratentorium white matter disease   REVIEW OF SYSTEMS: Full 14 system review of systems performed and notable only for weight gain, skin sensitivity,  ALLERGIES: Allergies  Allergen Reactions  . Actos  [Pioglitazone]     HOME MEDICATIONS: Current Outpatient Prescriptions  Medication Sig Dispense Refill  . atorvastatin (LIPITOR) 20 MG tablet Take 20 mg by mouth every evening.     . Cholecalciferol (VITAMIN D PO) Take 1 tablet by mouth daily.    . Cyanocobalamin (VITAMIN B 12 PO) Take 1 tablet by mouth 2 (two) times daily.    . Flaxseed,  Linseed, (FLAXSEED OIL) 1000 MG CAPS Take 1,000 mg by mouth 2 (two) times daily.    . fluticasone (FLONASE) 50 MCG/ACT nasal spray Place 2 sprays into the nose daily.     [name] gabapentin (NEURONTIN) 100 MG capsule Take 100 mg by mouth 3 (three) times daily.    . insulin detemir (LEVEMIR) 100 UNIT/ML injection Inject 0.3 mLs (30 Units total) into the skin 2 (two) times daily. 100 mL 1  . lisinopril (PRINIVIL,ZESTRIL) 2.5 MG tablet Take 1 tablet (2.5 mg total) by mouth daily. 30 tablet 2  . loratadine (CLARITIN) 10 MG tablet Take 10 mg by mouth daily.     . Misc Natural Products (OSTEO BI-FLEX TRIPLE STRENGTH PO) Take 1 tablet by mouth 2 (two) times daily.    [name] neomycin-polymyxin-hydrocortisone (CORTISPORIN) otic solution Place 4 drops into the left ear 4 (four) times daily. 10 mL 1  . Omega-3 Fatty Acids (FISH OIL) 1200 MG CAPS Take 1,200 mg by mouth daily.     [name] PARoxetine (PAXIL) 20 MG tablet Take 1 tablet (20 mg total) by mouth daily. 30 tablet 6  . rivaroxaban (XARELTO) 20 MG TABS tablet Take 1 tablet (20 mg total) by mouth daily with supper. 10 tablet 0  . sulfamethoxazole-trimethoprim (BACTRIM DS,SEPTRA DS) 800-160 MG tablet Take 1 tablet by mouth 2 (two) times daily. X 7 more days 14 tablet 0  . traMADol (ULTRAM) 50 MG tablet Take 1 tablet (50 mg total) by mouth  every 8 (eight) hours as needed. 20 tablet 0  . zolpidem (AMBIEN) 10 MG tablet TAKE 1/2 TO 1 TABLET AT BEDTIME 30 tablet 5   No current facility-administered medications for this visit.     PAST MEDICAL HISTORY: Past Medical History:  Diagnosis Date  . B12 deficiency   . CVA (cerebral vascular accident) ([place])   . Diabetes mellitus without complication ([place])   . Diverticulosis   . Gout   . Gouty arthropathy   . Hemorrhoids   . History of right-sided carotid endarterectomy 04/17/12  . Hyperplastic colon polyp   . Hypertension   . Kidney stones   . MRSA infection   . Nephrolithiasis   . Neuropathy ([place])   . OSA (obstructive  sleep apnea)   . Personal history of urinary calculi 05/25/2015    PAST SURGICAL HISTORY: Past Surgical History:  Procedure Laterality Date  . CAROTID ARTERY ANGIOPLASTY Right 04/17/2012   Dr. Delana Meyer, [facility]  . CAROTID ENDARTERECTOMY Right 04/17/2012   Dr. Delana Meyer, [facility]  . CHOLECYSTECTOMY  2005   Lap  . CHOLECYSTECTOMY  2005   lap  . EP IMPLANTABLE DEVICE N/A 02/23/2016   Procedure: Loop Recorder Insertion;  Surgeon: Thompson Grayer, MD;  Location: [place] [facility];  Service: Cardiovascular;  Laterality: N/A;  . HEMORRHOID SURGERY  2009  . TEE WITHOUT CARDIOVERSION N/A 02/23/2016   Procedure: TRANSESOPHAGEAL ECHOCARDIOGRAM (TEE);  Surgeon: Pixie Casino, MD;  Location[facility] ENDOSCOPY;  Service: Cardiovascular;  Laterality: N/A;  ALSO GETTING A LOOP    FAMILY HISTORY: Family History  Problem Relation Age of Onset  . Hodgkin's lymphoma Father   . Diabetes Mother     SOCIAL HISTORY:  Social History   Social History  . Marital status: Married    Spouse name: N/A  . Number of children: 3  . Years of education: HS Grad   Occupational History  . Retired    Social History Main Topics  . Smoking status: Former Smoker    Types: Cigars    Quit date: 04/08/2012  . Smokeless tobacco: Former Systems developer     Comment: cigarettes and cigars  . Alcohol use No  . Drug use: No  . Sexual activity: Not on file   Other Topics Concern  . Not on file   Social History Narrative   Lives at home with his granddaughter.   Right-handed.   No caffeine use.         PHYSICAL EXAM   Vitals:   11/12/16 0955  BP: 133/78  Pulse: 71  Weight: 233 lb 8 oz (105.9 kg)  Height: 5\' 7"  (1.702 m)    Not recorded      Body mass index is 36.57 kg/m.  PHYSICAL EXAMNIATION:  Gen: NAD, conversant, well nourised, obese, well groomed                     Cardiovascular: Regular rate rhythm, no peripheral edema, warm, nontender. Eyes: Conjunctivae clear without exudates or hemorrhage Neck: Supple, no  carotid bruits. Pulmonary: Clear to auscultation bilaterally   NEUROLOGICAL EXAM:  MENTAL STATUS: Speech:    Speech is normal; fluent and spontaneous with normal comprehension.  Cognition:     Orientation to time, place and person     Normal recent and remote memory     Normal Attention span and concentration     Normal Language, naming, repeating,spontaneous speech     Fund of knowledge   CRANIAL NERVES: CN II: Visual fields  are full to confrontation. Fundoscopic exam is normal with sharp discs and no vascular changes. Pupils are round equal and briskly reactive to light. CN III, IV, VI: extraocular movement are normal. No ptosis. CN V: Facial sensation is intact to pinprick in all 3 divisions bilaterally. Corneal responses are intact.  CN VII: Face is symmetric with normal eye closure and smile. CN VIII: Hearing is normal to rubbing fingers CN IX, X: Palate elevates symmetrically. Phonation is normal. CN XI: Head turning and shoulder shrug are intact CN XII: Tongue is midline with normal movements and no atrophy.  MOTOR: There is no pronator drift of out-stretched arms. Muscle bulk and tone are normal. Muscle strength is normal.  REFLEXES: Reflexes are 2+ and symmetric at the biceps, triceps, knees, and ankles. Plantar responses are flexor.  SENSORY: Intact to light touch, pinprick, positional sensation and vibratory sensation are intact in fingers and toes.  COORDINATION: Rapid alternating movements and fine finger movements are intact. There is no dysmetria on finger-to-nose and heel-knee-shin.    GAIT/STANCE: Posture is normal. Gait is steady with normal steps, base, arm swing, and turning. Heel and toe walking are normal. Tandem gait is normal.  Romberg is absent.   DIAGNOSTIC DATA (LABS, IMAGING, TESTING) - I reviewed patient records, labs, notes, testing and imaging myself where available.   ASSESSMENT AND PLAN  OKEY CHALUPA is a 70 y.o. male   Recurrent  traveling left-sided paresthesia History of supratentorium extensive white matter disease   he is at risk for developing partial seizure,  Above episode could represent right frontal partial seizure,  Proceed with EEG  Start Depakote ER 500 mg every night History of right carotid artery disease, right carotid artery endartectomy Chronic atrial fibrillation  On xelreto treatment   Marcial Pacas, M.D. Ph.D.  [facility] [address] [place], [place] [address] Ph: [phone] Fax: [phone]  LA:[id number] Courtney Heys, MD

## 2016-11-19 ENCOUNTER — Ambulatory Visit (INDEPENDENT_AMBULATORY_CARE_PROVIDER_SITE_OTHER): Payer: Commercial Managed Care - HMO | Admitting: *Deleted

## 2016-11-19 DIAGNOSIS — I639 Cerebral infarction, unspecified: Secondary | ICD-10-CM | POA: Diagnosis not present

## 2016-11-20 NOTE — Progress Notes (Signed)
Carelink Summary Report / Loop Recorder 

## 2016-12-03 ENCOUNTER — Ambulatory Visit (INDEPENDENT_AMBULATORY_CARE_PROVIDER_SITE_OTHER): Payer: Commercial Managed Care - HMO | Admitting: Physician Assistant

## 2016-12-03 ENCOUNTER — Encounter: Payer: Self-pay | Admitting: Physician Assistant

## 2016-12-03 VITALS — BP 116/70 | HR 80 | Temp 98.3°F | Resp 20 | Wt 243.0 lb

## 2016-12-03 DIAGNOSIS — B001 Herpesviral vesicular dermatitis: Secondary | ICD-10-CM

## 2016-12-03 MED ORDER — ACYCLOVIR 400 MG PO TABS: 400.0000 mg | ORAL_TABLET | Freq: Three times a day (TID) | ORAL | 5 refills | Status: DC

## 2016-12-03 MED ORDER — DOCOSANOL 10 % EX CREA: TOPICAL_CREAM | CUTANEOUS | 3 refills | Status: DC

## 2016-12-03 NOTE — Progress Notes (Signed)
Patient: Sean Hayden Male    DOB: November 10, 1946   70 y.o.   MRN: [id number] Visit Date: 12/03/2016  Today's Provider: Mar Daring, PA-C   Chief Complaint  Patient presents with  . Mouth Lesions   Subjective:    Mouth Lesions   The problem has been gradually worsening. Associated symptoms include mouth sores and rhinorrhea. Pertinent negatives include no fever.  [facility] of sx. He reports he took one whole pill yesterday and half a pill today. He does report he has been under more stress recently because his wife recently passed on 11/10/23.  Also has been MRSA positive with sore on his hand that was treated by the [place] and he uses antiseptic wash in the shower when he bathes now.     Allergies  Allergen Reactions  . Actos  [Pioglitazone]      Current Outpatient Prescriptions:  .  atorvastatin (LIPITOR) 20 MG tablet, Take 20 mg by mouth every evening. , Disp: , Rfl:  .  Cholecalciferol (VITAMIN D PO), Take 1 tablet by mouth daily., Disp: , Rfl:  .  Cyanocobalamin (VITAMIN B 12 PO), Take 1 tablet by mouth 2 (two) times daily., Disp: , Rfl:  .  divalproex (DEPAKOTE ER) 500 MG 24 hr tablet, Take 1 tablet (500 mg total) by mouth at bedtime., Disp: 90 tablet, Rfl: 4 .  Flaxseed, Linseed, (FLAXSEED OIL) 1000 MG CAPS, Take 1,000 mg by mouth 2 (two) times daily., Disp: , Rfl:  .  fluticasone (FLONASE) 50 MCG/ACT nasal spray, Place 2 sprays into the nose daily. , Disp: , Rfl:  .  gabapentin (NEURONTIN) 100 MG capsule, Take 100 mg by mouth 3 (three) times daily., Disp: , Rfl:  .  insulin detemir (LEVEMIR) 100 UNIT/ML injection, Inject 0.3 mLs (30 Units total) into the skin 2 (two) times daily., Disp: 100 mL, Rfl: 1 .  lisinopril (PRINIVIL,ZESTRIL) 2.5 MG tablet, Take 1 tablet (2.5 mg total) by mouth daily., Disp: 30 tablet, Rfl: 2 .  loratadine (CLARITIN) 10 MG tablet, Take 10 mg by mouth daily. , Disp: , Rfl:  .   Misc Natural Products (OSTEO BI-FLEX TRIPLE STRENGTH PO), Take 1 tablet by mouth 2 (two) times daily., Disp: , Rfl:  .  neomycin-polymyxin-hydrocortisone (CORTISPORIN) otic solution, Place 4 drops into the left ear 4 (four) times daily., Disp: 10 mL, Rfl: 1 .  Omega-3 Fatty Acids (FISH OIL) 1200 MG CAPS, Take 1,200 mg by mouth daily. , Disp: , Rfl:  .  PARoxetine (PAXIL) 20 MG tablet, Take 1 tablet (20 mg total) by mouth daily., Disp: 30 tablet, Rfl: 6 .  rivaroxaban (XARELTO) 20 MG TABS tablet, Take 1 tablet (20 mg total) by mouth daily with supper., Disp: 10 tablet, Rfl: 0 .  valACYclovir (VALTREX) 500 MG tablet, Take 500 mg by mouth 2 (two) times daily., Disp: , Rfl:  .  zolpidem (AMBIEN) 10 MG tablet, TAKE 1/2 TO 1 TABLET AT BEDTIME, Disp: 30 tablet, Rfl: 5  Review of Systems  Constitutional: Negative for activity change, appetite change, chills, diaphoresis, fatigue, fever and unexpected weight change.  HENT: Positive for mouth sores and rhinorrhea.   Respiratory: Negative.   Cardiovascular: Negative.   Gastrointestinal: Negative.   Neurological: Negative.     Social History  Substance Use Topics  . Smoking status: Former Smoker    Types: Cigars    Quit date: 04/08/2012  .  Smokeless tobacco: Former Systems developer     Comment: cigarettes and cigars  . Alcohol use No   Objective:   BP 116/70 (BP Location: Left Arm, Patient Position: Sitting, Cuff Size: Large)   Pulse 80   Temp 98.3 F (36.8 C) (Oral)   Resp 20   Wt 243 lb (110.2 kg)   BMI 38.06 kg/m   Physical Exam  Constitutional: He appears well-developed and well-nourished. No distress.  HENT:  Head: Normocephalic and atraumatic.  Mouth/Throat:    Neck: Normal range of motion. Neck supple.  Cardiovascular: Normal rate, regular rhythm and normal heart sounds.  Exam reveals no gallop and no friction rub.   No murmur heard. Pulmonary/Chest: Effort normal and breath sounds normal. No respiratory distress. He has no wheezes. He has  no rales.  Skin: He is not diaphoretic.  Vitals reviewed.     Assessment & Plan:     1. Herpes labialis Will treat as below with acyclovir since he has not taken enough to treat the current outbreak. He is to call if symptoms do not improve or worsen. - acyclovir (ZOVIRAX) 400 MG tablet; Take 1 tablet (400 mg total) by mouth 3 (three) times daily.  Dispense: 15 tablet; Refill: 5 - Docosanol 10 % CREA; Apply topically up to 5 times per day as needed until heals  Dispense: 2 g; Refill: 3     Patient seen and examined by Mar Daring, PA-C, and note scribed by Renaldo Fiddler, CMA.  Mar Daring, PA-C  [facility]

## 2016-12-03 NOTE — Patient Instructions (Signed)
Cold Sore A cold sore (fever blister) is a skin infection caused by the herpes simplex virus (HSV-1). HSV-1 is closely related to the virus that causes genital herpes (HSV-2), but they are not the same even though both viruses can cause oral and genital infections. Cold sores are small, fluid-filled sores inside of the mouth or on the lips, gums, nose, chin, cheeks, or fingers.  The herpes simplex virus can be easily passed (contagious) to other people through close personal contact, such as kissing or sharing personal items. The virus can also spread to other parts of the body, such as the eyes or genitals. Cold sores are contagious until the sores crust over completely. They often heal within 2 weeks.  Once a person is infected, the herpes simplex virus remains permanently in the body. Therefore, there is no cure for cold sores, and they often recur when a person is tired, stressed, sick, or gets too much sun. Additional factors that can cause a recurrence include hormone changes in menstruation or pregnancy, certain drugs, and cold weather.  CAUSES  Cold sores are caused by the herpes simplex virus. The virus is spread from person to person through close contact, such as through kissing, touching the affected area, or sharing personal items such as lip balm, razors, or eating utensils.  SYMPTOMS  The first infection may not cause symptoms. If symptoms develop, the symptoms often go through different stages. Here is how a cold sore develops:   Tingling, itching, or burning is felt 1-2 days before the outbreak.   Fluid-filled blisters appear on the lips, inside the mouth, nose, or on the cheeks.   The blisters start to ooze clear fluid.   The blisters dry up and a yellow crust appears in its place.   The crust falls off.  Symptoms depend on whether it is the initial outbreak or a recurrence. Some other symptoms with the first outbreak may include:   Fever.   Sore throat.   Headache.    Muscle aches.   Swollen neck glands.  DIAGNOSIS  A diagnosis is often made based on your symptoms and looking at the sores. Sometimes, a sore may be swabbed and then examined in the lab to make a final diagnosis. If the sores are not present, blood tests can find the herpes simplex virus.  TREATMENT  There is no cure for cold sores and no vaccine for the herpes simplex virus. Within 2 weeks, most cold sores go away on their own without treatment. Medicines cannot make the infection go away, but medicine can help relieve some of the pain associated with the sores, can work to stop the virus from multiplying, and can also shorten healing time. Medicine may be in the form of creams, gels, pills, or a shot.  HOME CARE INSTRUCTIONS   Only take over-the-counter or prescription medicines for pain, discomfort, or fever as directed by your caregiver. Do not use aspirin.   Use a cotton-tip swab to apply creams or gels to your sores.   Do not touch the sores or pick the scabs. Wash your hands often. Do not touch your eyes without washing your hands first.   Avoid kissing, oral sex, and sharing personal items until sores heal.   Apply an ice pack on your sores for 10-15 minutes to ease any discomfort.   Avoid hot, cold, or salty foods because they may hurt your mouth. Eat a soft, bland diet to avoid irritating the sores. Use a straw to drink   Avoid hot, cold, or salty foods because they may hurt your mouth. Eat a soft, bland diet to avoid irritating the sores. Use a straw to drink if you have pain when drinking out of a glass.    Keep sores clean and dry to prevent an infection of other tissues.    Avoid the sun and limit stress if these things trigger outbreaks. If sun causes cold sores, apply sunscreen on the lips before being out in the sun.   SEEK MEDICAL CARE IF:    You have a fever or persistent symptoms for more than 2-3 days.    You have a fever and your symptoms suddenly get worse.    You have pus, not clear fluid, coming from the sores.    You have redness that is spreading.    You have pain or irritation in your  eye.    You get sores on your genitals.    Your sores do not heal within 2 weeks.    You have a weakened immune system.    You have frequent recurrences of cold sores.   MAKE SURE YOU:    Understand these instructions.   Will watch your condition.   Will get help right away if you are not doing well or get worse.     This information is not intended to replace advice given to you by your health care provider. Make sure you discuss any questions you have with your health care provider.     Document Released: 11/22/2000 Document Revised: 12/16/2014 Document Reviewed: 09/15/2015  Elsevier Interactive Patient Education 2017 [employer]vier Inc[employer]

## 2016-12-04 LAB — CUP PACEART REMOTE DEVICE CHECK
Implantable Pulse Generator Implant Date: 20170317
MDC IDC SESS DTM: 20171227180001

## 2016-12-06 ENCOUNTER — Ambulatory Visit (INDEPENDENT_AMBULATORY_CARE_PROVIDER_SITE_OTHER): Payer: Commercial Managed Care - HMO

## 2016-12-06 ENCOUNTER — Ambulatory Visit (INDEPENDENT_AMBULATORY_CARE_PROVIDER_SITE_OTHER): Payer: Commercial Managed Care - HMO | Admitting: Family Medicine

## 2016-12-06 VITALS — BP 112/68 | HR 72 | Temp 97.8°F | Ht 67.0 in | Wt 240.5 lb

## 2016-12-06 DIAGNOSIS — Z0000 Encounter for general adult medical examination without abnormal findings: Secondary | ICD-10-CM | POA: Diagnosis not present

## 2016-12-06 DIAGNOSIS — L0100 Impetigo, unspecified: Secondary | ICD-10-CM

## 2016-12-06 DIAGNOSIS — B001 Herpesviral vesicular dermatitis: Secondary | ICD-10-CM | POA: Diagnosis not present

## 2016-12-06 DIAGNOSIS — J301 Allergic rhinitis due to pollen: Secondary | ICD-10-CM

## 2016-12-06 MED ORDER — AMOXICILLIN-POT CLAVULANATE 875-125 MG PO TABS: 1.0000 | ORAL_TABLET | Freq: Two times a day (BID) | ORAL | 0 refills | Status: AC

## 2016-12-06 MED ORDER — FLUTICASONE PROPIONATE 50 MCG/ACT NA SUSP: 2.0000 | Freq: Every day | NASAL | 1 refills | Status: AC

## 2016-12-06 NOTE — Patient Instructions (Signed)
Sean Hayden , Thank you for taking time to come for your Medicare Wellness Visit. I appreciate your ongoing commitment to your health goals. Please review the following plan we discussed and let me know if I can assist you in the future.   These are the goals we discussed: Goals    None      This is a list of the screening recommended for you and due dates:  Health Maintenance  Topic Date Due  . Complete foot exam   11/26/1955  . Eye exam for diabetics  11/26/1955  . Colon Cancer Screening  12/10/2015  . Stool Blood Test  05/29/2016  . Flu Shot  07/09/2016  . Hemoglobin A1C  04/09/2017  . Tetanus Vaccine  07/24/2023  . Shingles Vaccine  Completed  .  Hepatitis C: One time screening is recommended by Center for Disease Control  (CDC) for  adults born from [birth year] through 1966.   Completed  . Pneumonia vaccines  Completed   Preventive Care for Adults  A healthy lifestyle and preventive care can promote health and wellness. Preventive health guidelines for adults include the following key practices.  . A routine yearly physical is a good way to check with your health care provider about your health and preventive screening. It is a chance to share any concerns and updates on your health and to receive a thorough exam.  . Visit your dentist for a routine exam and preventive care every 6 months. Brush your teeth twice a day and floss once a day. Good oral hygiene prevents tooth decay and gum disease.  . The frequency of eye exams is based on your age, health, family medical history, use  of contact lenses, and other factors. Follow your health care provider's ecommendations for frequency of eye exams.  . Eat a healthy diet. Foods like vegetables, fruits, whole grains, low-fat dairy products, and lean protein foods contain the nutrients you need without too many calories. Decrease your intake of foods high in solid fats, added sugars, and salt. Eat the right amount of calories for you. Get  information about a proper diet from your health care provider, if necessary.  . Regular physical exercise is one of the most important things you can do for your health. Most adults should get at least 150 minutes of moderate-intensity exercise (any activity that increases your heart rate and causes you to sweat) each week. In addition, most adults need muscle-strengthening exercises on 2 or more days a week.  Silver Sneakers may be a benefit available to you. To determine eligibility, you may visit the website: www.silversneakers.com or contact program at (828) 726-0955 Mon-Fri between 8AM-8PM.   . Maintain a healthy weight. The body mass index (BMI) is a screening tool to identify possible weight problems. It provides an estimate of body fat based on height and weight. Your health care provider can find your BMI and can help you achieve or maintain a healthy weight.   For adults 20 years and older: ? A BMI below 18.5 is considered underweight. ? A BMI of 18.5 to 24.9 is normal. ? A BMI of 25 to 29.9 is considered overweight. ? A BMI of 30 and above is considered obese.   . Maintain normal blood lipids and cholesterol levels by exercising and minimizing your intake of saturated fat. Eat a balanced diet with plenty of fruit and vegetables. Blood tests for lipids and cholesterol should begin at age 71 and be repeated every 5 years. If your  lipid or cholesterol levels are high, you are over 50, or you are at high risk for heart disease, you may need your cholesterol levels checked more frequently. Ongoing high lipid and cholesterol levels should be treated with medicines if diet and exercise are not working.  . If you smoke, find out from your health care provider how to quit. If you do not use tobacco, please do not start.  . If you choose to drink alcohol, please do not consume more than 2 drinks per day. One drink is considered to be 12 ounces (355 mL) of beer, 5 ounces (148 mL) of wine, or 1.5  ounces (44 mL) of liquor.  . If you are 74-75 years old, ask your health care provider if you should take aspirin to prevent strokes.  . Use sunscreen. Apply sunscreen liberally and repeatedly throughout the day. You should seek shade when your shadow is shorter than you. Protect yourself by wearing long sleeves, pants, a wide-brimmed hat, and sunglasses year round, whenever you are outdoors.  . Once a month, do a whole body skin exam, using a mirror to look at the skin on your back. Tell your health care provider of new moles, moles that have irregular borders, moles that are larger than a pencil eraser, or moles that have changed in shape or color.

## 2016-12-06 NOTE — Progress Notes (Signed)
Subjective:   Sean Hayden is a 70 y.o. male who presents for Medicare Annual/Subsequent preventive examination.  Review of Systems:  N/A  Cardiac Risk Factors include: advanced age (>57men, >60 women);diabetes mellitus;dyslipidemia;hypertension;male gender;obesity (BMI >30kg/m2)     Objective:    Vitals: BP 112/68 (BP Location: Left Arm)   Pulse 72   Temp 97.8 F (36.6 C) (Oral)   Ht 5\' 7"  (1.702 m)   Wt 240 lb 8 oz (109.1 kg)   BMI 37.67 kg/m   Body mass index is 37.67 kg/m.  Tobacco History  Smoking Status  . Former Smoker  . Types: Cigars  . Quit date: 04/08/2012  Smokeless Tobacco  . Never Used    Comment: cigarettes and cigars     Counseling given: Not Answered   Past Medical History:  Diagnosis Date  . B12 deficiency   . CVA (cerebral vascular accident) ([place])   . Diabetes mellitus without complication ([place])   . Diverticulosis   . Gout   . Gouty arthropathy   . Hemorrhoids   . History of right-sided carotid endarterectomy 04/17/12  . Hyperplastic colon polyp   . Hypertension   . Kidney stones   . MRSA infection   . Nephrolithiasis   . Neuropathy ([place])   . OSA (obstructive sleep apnea)   . Personal history of urinary calculi 05/25/2015   Past Surgical History:  Procedure Laterality Date  . CAROTID ARTERY ANGIOPLASTY Right 04/17/2012   Dr. Delana Meyer, [facility]  . CAROTID ENDARTERECTOMY Right 04/17/2012   Dr. Delana Meyer, [facility]  . CHOLECYSTECTOMY  2005   Lap  . CHOLECYSTECTOMY  2005   lap  . EP IMPLANTABLE DEVICE N/A 02/23/2016   Procedure: Loop Recorder Insertion;  Surgeon: Thompson Grayer, MD;  Location: [place] [facility];  Service: Cardiovascular;  Laterality: N/A;  . HEMORRHOID SURGERY  2009  . TEE WITHOUT CARDIOVERSION N/A 02/23/2016   Procedure: TRANSESOPHAGEAL ECHOCARDIOGRAM (TEE);  Surgeon: Pixie Casino, MD;  Location: [facility] END[facility]COPY;  Service: Cardiovascular;  Laterality: N/A;  ALSO GETTING A LOOP   Family History  Problem Relation Age of Onset    . Hodgkin's lymphoma Father   . Diabetes Mother    History  Sexual Activity  . Sexual activity: Not on file    Outpatient Encounter Prescriptions as of 12/06/2016  Medication Sig  . acyclovir (ZOVIRAX) 400 MG tablet Take 1 tablet (400 mg total) by mouth 3 (three) times daily.  [name] atorvastatin (LIPITOR) 20 MG tablet Take 20 mg by mouth every evening.   . Cholecalciferol (VITAMIN D PO) Take 1 tablet by mouth daily.  . Cyanocobalamin (VITAMIN B 12 PO) Take 1 tablet by mouth 2 (two) times daily.  . divalproex (DEPAKOTE ER) 500 MG 24 hr tablet Take 1 tablet (500 mg total) by mouth at bedtime.  . Flaxseed, Linseed, (FLAXSEED OIL) 1000 MG CAPS Take 1,000 mg by mouth 2 (two) times daily.  . fluticasone (FLONASE) 50 MCG/ACT nasal spray Place 2 sprays into the nose daily.   [name] gabapentin (NEURONTIN) 100 MG capsule Take 100 mg by mouth 3 (three) times daily.  . insulin detemir (LEVEMIR) 100 UNIT/ML injection Inject 0.3 mLs (30 Units total) into the skin 2 (two) times daily.  [name] lisinopril (PRINIVIL,ZESTRIL) 2.5 MG tablet Take 1 tablet (2.5 mg total) by mouth daily.  [name] loratadine (CLARITIN) 10 MG tablet Take 10 mg by mouth daily.   . Misc Natural Products (OSTEO BI-FLEX TRIPLE STRENGTH PO) Take 1 tablet by mouth 2 (two)  times daily.   [name] neomycin-polymyxin-hydrocortisone (CORTISPORIN) otic solution Place 4 drops into the left ear 4 (four) times daily.  . Omega-3 Fatty Acids (FISH OIL) 1200 MG CAPS Take 1,200 mg by mouth daily.   [name] PARoxetine (PAXIL) 20 MG tablet Take 1 tablet (20 mg total) by mouth daily.  . rivaroxaban (XARELTO) 20 MG TABS tablet Take 1 tablet (20 mg total) by mouth daily with supper.  . valACYclovir (VALTREX) 500 MG tablet Take 500 mg by mouth 2 (two) times daily.  [name] zolpidem (AMBIEN) 10 MG tablet TAKE 1/2 TO 1 TABLET AT BEDTIME  . benzocaine (ORAJEL) 10 % mucosal gel Use as directed 1 application in the mouth or throat as needed for mouth pain.  . Docosanol 10 % CREA Apply  topically up to 5 times per day as needed until heals (Patient not taking: Reported on 12/06/2016)   No facility-administered encounter medications on file as of 12/06/2016.     Activities of Daily Living In your present state of health, do you have any difficulty performing the following activities: 12/06/2016 10/10/2016  Hearing? Tempie Donning  Vision? N N  Difficulty concentrating or making decisions? Y N  Walking or climbing stairs? N N  Dressing or bathing? N N  Doing errands, shopping? N N  Preparing Food and eating ? N -  Using the Toilet? N -  In the past six months, have you accidently leaked urine? Y -  Do you have problems with loss of bowel control? N -  Managing your Medications? N -  Managing your Finances? N -  Housekeeping or managing your Housekeeping? N -  Some recent data might be hidden    Patient Care Team: Birdie Sons, MD as PCP - General (Family Medicine) Algernon Huxley, MD as Referring Physician (Vascular Surgery) Marcial Pacas, MD as Consulting Physician (Neurology) Jennette Kettle, MD as Referring Physician (Internal Medicine) Ranee Gosselin, RN as Registered Nurse   Assessment:     Exercise Activities and Dietary recommendations Current Exercise Habits: The patient does not participate in regular exercise at present, Exercise limited by: None identified  Goals    None     Fall Risk Fall Risk  12/06/2016 05/03/2016  Falls in the past year? No No   Depression Screen PHQ 2/9 Scores 12/06/2016 05/03/2016 05/03/2016  PHQ - 2 Score 1 1 0    Cognitive Function     6CIT Screen 12/06/2016  What Year? 4 points  What month? 0 points  What time? 0 points  Count back from 20 0 points  Months in reverse 0 points  Repeat phrase 2 points  Total Score 6    Immunization History  Administered Date(s) Administered  . Influenza, High Dose Seasonal PF 08/25/2015  . Influenza-Unspecified 09/23/2014  . Pneumococcal Conjugate-13 11/01/2014  . Pneumococcal  Polysaccharide-23 11/15/2008, 04/27/2012  . Tdap 04/17/2010, 07/23/2013  . Zoster 09/23/2014   Screening Tests Health Maintenance  Topic Date Due  . OPHTHALMOLOGY EXAM  05/09/2017 (Originally 11/26/1956)  . COLON CANCER SCREENING ANNUAL FOBT  12/06/2026 (Originally 05/29/2016)  . COLONOSCOPY  12/06/2026 (Originally 12/10/2015)  . HEMOGLOBIN A1C  04/09/2017  . FOOT EXAM  09/08/2017  . TETANUS/TDAP  07/24/2023  . INFLUENZA VACCINE  Completed  . ZOSTAVAX  Completed  . Hepatitis C Screening  Completed  . PNA vac Low Risk Adult  Completed      Plan:  I have personally reviewed and addressed the Medicare Annual Wellness questionnaire and have noted the  following in the patient's chart:  A. Medical and social history B. Use of alcohol, tobacco or illicit drugs  C. Current medications and supplements D. Functional ability and status E.  Nutritional status F.  Physical activity G. Advance directives H. List of other physicians I.  Hospitalizations, surgeries, and ER visits in previous 12 months J.  [place] such as hearing and vision if needed, cognitive and depression L. Referrals and appointments - none  In addition, I have reviewed and discussed with patient certain preventive protocols, quality metrics, and best practice recommendations. A written personalized care plan for preventive services as well as general preventive health recommendations were provided to patient.  See attached scanned questionnaire for additional information.   Signed,  Fabio Neighbors, LPN Nurse Health Advisor   MD Recommendations: Follow up on colonoscopy, pt declined today.  I have reviewed the health advisor's note, was available for consultation, and agree with documentation and plan  Lelon Huh, MD

## 2016-12-06 NOTE — Progress Notes (Signed)
Patient: Sean Hayden Male    DOB: 07-29-1946   70 y.o.   MRN: [id number] Visit Date: 12/06/2016  Today's Provider: Lelon Huh, MD   Chief Complaint  Patient presents with  . Follow-up   Subjective:    HPI  Herpes labialis From 12/03/2016-patient was seen by Fenton Malling. Treated and increased acyclovir and prescribed docosanol 10 % CREAm. States swelling has improved, but now have crusty lesion on top lip.   Allergies  Allergen Reactions  . Actos  [Pioglitazone]      Current Outpatient Prescriptions:  .  acyclovir (ZOVIRAX) 400 MG tablet, Take 1 tablet (400 mg total) by mouth 3 (three) times daily., Disp: 15 tablet, Rfl: 5 .  atorvastatin (LIPITOR) 20 MG tablet, Take 20 mg by mouth every evening. , Disp: , Rfl:  .  benzocaine (ORAJEL) 10 % mucosal gel, Use as directed 1 application in the mouth or throat as needed for mouth pain., Disp: , Rfl:  .  Cholecalciferol (VITAMIN D PO), Take 1 tablet by mouth daily., Disp: , Rfl:  .  Cyanocobalamin (VITAMIN B 12 PO), Take 1 tablet by mouth 2 (two) times daily., Disp: , Rfl:  .  divalproex (DEPAKOTE ER) 500 MG 24 hr tablet, Take 1 tablet (500 mg total) by mouth at bedtime., Disp: 90 tablet, Rfl: 4 .  Docosanol 10 % CREA, Apply topically up to 5 times per day as needed until heals (Patient not taking: Reported on 12/06/2016), Disp: 2 g, Rfl: 3 .  Flaxseed, Linseed, (FLAXSEED OIL) 1000 MG CAPS, Take 1,000 mg by mouth 2 (two) times daily., Disp: , Rfl:  .  fluticasone (FLONASE) 50 MCG/ACT nasal spray, Place 2 sprays into the nose daily. , Disp: , Rfl:  .  gabapentin (NEURONTIN) 100 MG capsule, Take 100 mg by mouth 3 (three) times daily., Disp: , Rfl:  .  insulin detemir (LEVEMIR) 100 UNIT/ML injection, Inject 0.3 mLs (30 Units total) into the skin 2 (two) times daily., Disp: 100 mL, Rfl: 1 .  lisinopril (PRINIVIL,ZESTRIL) 2.5 MG tablet, Take 1 tablet (2.5 mg total) by mouth daily., Disp: 30 tablet, Rfl: 2 .  loratadine  (CLARITIN) 10 MG tablet, Take 10 mg by mouth daily. , Disp: , Rfl:  .  Misc Natural Products (OSTEO BI-FLEX TRIPLE STRENGTH PO), Take 1 tablet by mouth 2 (two) times daily. , Disp: , Rfl:  .  neomycin-polymyxin-hydrocortisone (CORTISPORIN) otic solution, Place 4 drops into the left ear 4 (four) times daily., Disp: 10 mL, Rfl: 1 .  Omega-3 Fatty Acids (FISH OIL) 1200 MG CAPS, Take 1,200 mg by mouth daily. , Disp: , Rfl:  .  PARoxetine (PAXIL) 20 MG tablet, Take 1 tablet (20 mg total) by mouth daily., Disp: 30 tablet, Rfl: 6 .  rivaroxaban (XARELTO) 20 MG TABS tablet, Take 1 tablet (20 mg total) by mouth daily with supper., Disp: 10 tablet, Rfl: 0 .  valACYclovir (VALTREX) 500 MG tablet, Take 500 mg by mouth 2 (two) times daily., Disp: , Rfl:  .  zolpidem (AMBIEN) 10 MG tablet, TAKE 1/2 TO 1 TABLET AT BEDTIME, Disp: 30 tablet, Rfl: 5  Review of Systems  Constitutional: Negative for appetite change, chills and fever.  Respiratory: Negative for chest tightness, shortness of breath and wheezing.   Cardiovascular: Negative for chest pain and palpitations.  Gastrointestinal: Negative for abdominal pain, nausea and vomiting.    Social History  Substance Use Topics  . Smoking status: Former Smoker  Types: Cigars    Quit date: 04/08/2012  . Smokeless tobacco: Never Used     Comment: cigarettes and cigars  . Alcohol use No   Objective:    Vital Signs - Last Recorded  Most recent update: 12/06/2016 9:40 AM by Fabio Neighbors, LPN  BP  [id number] (BP Location: Left Arm)     Pulse  72     Temp  97.8 F (36.6 C) (Oral)     Ht  5\' 7"  (1.702 m)     Wt  240 lb 8 oz (109.1 kg)      BMI  37.67 kg/m       Physical Exam  Several yellow crusty lesion above upper lip. No erythema. Several vesicular lesions on upper and lower lip noted.     Assessment & Plan:     1. Impetigo Likely secondary to herpes labialis.  - amoxicillin-clavulanate (AUGMENTIN) 875-125 MG tablet; Take 1 tablet  by mouth 2 (two) times daily.  Dispense: 20 tablet; Refill: 0  2. Herpes labialis Continue acyclovir  3. Allergic rhinitis due to pollen, unspecified chronicity, unspecified seasonality He also requests refill for fluticasone which he states is working well for allergies.   - fluticasone (FLONASE) 50 MCG/ACT nasal spray; Place 2 sprays into both nostrils daily.  Dispense: 16 g; Refill: 1       Lelon Huh, MD  [facility]

## 2016-12-19 ENCOUNTER — Ambulatory Visit (INDEPENDENT_AMBULATORY_CARE_PROVIDER_SITE_OTHER): Payer: Medicare HMO | Admitting: *Deleted

## 2016-12-19 ENCOUNTER — Telehealth: Payer: Self-pay | Admitting: Cardiology

## 2016-12-19 DIAGNOSIS — I639 Cerebral infarction, unspecified: Secondary | ICD-10-CM

## 2016-12-19 NOTE — Telephone Encounter (Signed)
Called pt back, walked pt through sending a remote transmission, pt received an error code on his transmitter, number given to [employer] for pt to call and troubleshoot monitor. Pt stated that he would call.

## 2016-12-19 NOTE — Telephone Encounter (Signed)
New Message:    Pt says he does not know how to transmit.

## 2016-12-20 NOTE — Progress Notes (Signed)
Carelink Summary Report / Loop Recorder 

## 2016-12-30 ENCOUNTER — Ambulatory Visit (INDEPENDENT_AMBULATORY_CARE_PROVIDER_SITE_OTHER): Payer: Medicare HMO | Admitting: Neurology

## 2016-12-30 ENCOUNTER — Telehealth: Payer: Self-pay | Admitting: Neurology

## 2016-12-30 DIAGNOSIS — I480 Paroxysmal atrial fibrillation: Secondary | ICD-10-CM

## 2016-12-30 DIAGNOSIS — G459 Transient cerebral ischemic attack, unspecified: Secondary | ICD-10-CM

## 2016-12-30 DIAGNOSIS — R2990 Unspecified symptoms and signs involving the nervous system: Secondary | ICD-10-CM

## 2016-12-30 DIAGNOSIS — I779 Disorder of arteries and arterioles, unspecified: Secondary | ICD-10-CM

## 2016-12-30 DIAGNOSIS — G4733 Obstructive sleep apnea (adult) (pediatric): Secondary | ICD-10-CM

## 2016-12-30 DIAGNOSIS — I739 Peripheral vascular disease, unspecified: Secondary | ICD-10-CM

## 2016-12-30 NOTE — Telephone Encounter (Signed)
I called the patient. The EEG showed mild diffuse slowing, no evidence of seizures.  He had another event of left sided numbness yesterday.  The patient is on Xarelto, I will have him add a baby aspirin a day to this. It is possible that the patient has small vessel disease which may be leading to intermittent symptoms. The patient denies any headache with the episodes of numbness.

## 2016-12-30 NOTE — Procedures (Signed)
     History: Sean Hayden is a 71 year old gentleman with a history of diabetes and atrial fibrillation and a history of cerebrovascular disease who has had episodes of left hand and arm numbness also involving the left face. Episodes will last about 5 minutes. MRI has not shown any acute abnormalities. The patient is being evaluated for possible seizures.  This is a routine EEG. No skull defects are noted. Medications include Lipitor, vitamin D supplementation, Flonase, gabapentin, insulin, lisinopril, Claritin, fish oil, Paxil, Xarelto, Ultram, and Ambien.  EEG classification: Dysrhythmia grade 1 generalized  Description of the recording: The background rhythms of this recording consists of a relatively well modulated medium amplitude 7-8 hertz background activity that is reactive to opening and closure. As the record progresses, photic stimulation is performed, and this results in a bilateral and symmetric photic driving response. Hyperventilation is also performed, and this results in a minimal buildup of the background rhythm activities without significant slowing seen. Towards the end of the recording, the patient appears to enter the drowsy state with an increase in the background slowing, and there is no definite transition into stage II sleep. At no time during the recording does there appear to be evidence of spike or spike-wave discharges or evidence of focal slowing. EKG monitor shows no evidence of cardiac rhythm abnormalities with a heart rate of 66.  Impression: This is a minimally abnormal EEG recording secondary to slight symmetric background slowing. This is a nonspecific recording, and can be seen with a mild toxic or metabolic encephalopathy or any dementing type illness. At no time does there appear to be evidence of epileptiform discharges.

## 2017-01-09 LAB — CUP PACEART REMOTE DEVICE CHECK
Date Time Interrogation Session: 20171213004850
MDC IDC PG IMPLANT DT: 20170317

## 2017-01-09 NOTE — Progress Notes (Signed)
Carelink summary report received. Battery status OK. Normal device function. No new symptom episodes, tachy episodes, brady, or pause episodes. 1 AF 0% +Xarelto. Monthly summary reports and ROV/PRN 

## 2017-01-20 ENCOUNTER — Ambulatory Visit (INDEPENDENT_AMBULATORY_CARE_PROVIDER_SITE_OTHER): Payer: Medicare HMO | Admitting: *Deleted

## 2017-01-20 DIAGNOSIS — I639 Cerebral infarction, unspecified: Secondary | ICD-10-CM | POA: Diagnosis not present

## 2017-01-20 LAB — CUP PACEART REMOTE DEVICE CHECK
Implantable Pulse Generator Implant Date: 20170317
MDC IDC SESS DTM: 20180312150302

## 2017-01-20 NOTE — Progress Notes (Signed)
Carelink Summary Report / Loop Recorder 

## 2017-01-21 ENCOUNTER — Ambulatory Visit: Payer: Commercial Managed Care - HMO | Admitting: Family Medicine

## 2017-01-22 ENCOUNTER — Telehealth: Payer: Self-pay | Admitting: Cardiology

## 2017-01-22 NOTE — Telephone Encounter (Signed)
Spoke w/ pt and requested that he send a manual transmission b/c his home monitor has not updated in at least 14 days.   

## 2017-01-23 LAB — BASIC METABOLIC PANEL
BUN: 12 mg/dL (ref 4–21)
CREATININE: 0.7 mg/dL (ref 0.6–1.3)
Glucose: 148 mg/dL

## 2017-01-23 LAB — CBC AND DIFFERENTIAL
HCT: 41 % (ref 41–53)
HEMOGLOBIN: 14.1 g/dL (ref 13.5–17.5)
Platelets: 184 10*3/uL (ref 150–399)
WBC: 5.3 10*3/mL

## 2017-01-23 LAB — HEPATIC FUNCTION PANEL
ALT: 20 U/L (ref 10–40)
AST: 14 U/L (ref 14–40)

## 2017-01-23 LAB — CBC: MCV: 93

## 2017-01-23 LAB — HEMOGLOBIN A1C: Hemoglobin A1C: 7.3

## 2017-01-29 LAB — CUP PACEART REMOTE DEVICE CHECK
Date Time Interrogation Session: 20180112003838
MDC IDC PG IMPLANT DT: 20170317

## 2017-01-31 ENCOUNTER — Ambulatory Visit (INDEPENDENT_AMBULATORY_CARE_PROVIDER_SITE_OTHER): Payer: Medicare HMO | Admitting: Family Medicine

## 2017-01-31 ENCOUNTER — Encounter: Payer: Self-pay | Admitting: Family Medicine

## 2017-01-31 VITALS — BP 110/64 | HR 70 | Temp 97.7°F | Resp 16 | Ht 67.0 in | Wt 247.0 lb

## 2017-01-31 DIAGNOSIS — I10 Essential (primary) hypertension: Secondary | ICD-10-CM

## 2017-01-31 DIAGNOSIS — E1140 Type 2 diabetes mellitus with diabetic neuropathy, unspecified: Secondary | ICD-10-CM

## 2017-01-31 DIAGNOSIS — Z794 Long term (current) use of insulin: Secondary | ICD-10-CM | POA: Diagnosis not present

## 2017-01-31 DIAGNOSIS — Z8614 Personal history of Methicillin resistant Staphylococcus aureus infection: Secondary | ICD-10-CM | POA: Insufficient documentation

## 2017-01-31 NOTE — Patient Instructions (Signed)
   Please contact your eyecare professional to schedule a routine eye exam  

## 2017-01-31 NOTE — Progress Notes (Signed)
Patient: Sean Hayden Male    DOB: July 30, 1946   71 y.o.   MRN: [id number] Visit Date: 01/31/2017  Today's Provider: Lelon Huh, MD   Chief Complaint  Patient presents with  . Follow-up  . Diabetes  . Hypertension   Subjective:    HPI  Situational stress From 07/31/2016-no changes. Doing well with paroxetine. No adverse effects and wishes to continue   Diabetes Mellitus Type II, Follow-up:   Lab Results  Component Value Date   HGBA1C 6.6 (H) 10/10/2016   HGBA1C 6.1 05/28/2016   HGBA1C 8.1 (H) 02/21/2016   Last seen for diabetes 6 months ago.  Management since then includes; no changes. He reports good compliance with treatment.  He is not having side effects. none Current symptoms include none and have been unchanged. Home blood sugar records: fasting range: 80-135  Episodes of hypoglycemia? no   Current Insulin Regimen: Levemir 40 in am  & 35 units in pm Most Recent Eye Exam: due Weight trend: stable Prior visit with dietician: no Current diet: well balanced  Current exercise: none  He had been on Invokana, but stopped about 6 months or so ago since A1c at [facility] were sonsistently in to low 6 range. He states he had blood drawn last week at [facility] and is waiting for results.  ----------------------------------------------------------------   Hypertension, follow-up:  BP Readings from Last 3 Encounters:  01/31/17 110/64  12/06/16 112/68  12/03/16 116/70    He was last seen for hypertension 6 months ago.  BP at that visit was 120/54. Management since that visit includes; advised to stop lisinopril if he develops any hypotension.He reports good compliance with treatment. He is not having side effects. none He is not exercising. He is adherent to low salt diet.   Outside blood pressures are not checking. He is experiencing none.  Patient denies none.   Cardiovascular risk factors include diabetes.  Use of agents associated with hypertension: none.     ----------------------------------------------------------------   Allergies  Allergen Reactions  . Actos  [Pioglitazone]      Current Outpatient Prescriptions:  .  acyclovir (ZOVIRAX) 400 MG tablet, Take 1 tablet (400 mg total) by mouth 3 (three) times daily., Disp: 15 tablet, Rfl: 5 .  atorvastatin (LIPITOR) 20 MG tablet, Take 20 mg by mouth every evening. , Disp: , Rfl:  .  benzocaine (ORAJEL) 10 % mucosal gel, Use as directed 1 application in the mouth or throat as needed for mouth pain., Disp: , Rfl:  .  Cholecalciferol (VITAMIN D PO), Take 1 tablet by mouth daily., Disp: , Rfl:  .  Cyanocobalamin (VITAMIN B 12 PO), Take 1 tablet by mouth 2 (two) times daily., Disp: , Rfl:  .  divalproex (DEPAKOTE ER) 500 MG 24 hr tablet, Take 1 tablet (500 mg total) by mouth at bedtime., Disp: 90 tablet, Rfl: 4 .  Docosanol 10 % CREA, Apply topically up to 5 times per day as needed until heals, Disp: 2 g, Rfl: 3 .  Flaxseed, Linseed, (FLAXSEED OIL) 1000 MG CAPS, Take 1,000 mg by mouth 2 (two) times daily., Disp: , Rfl:  .  fluticasone (FLONASE) 50 MCG/ACT nasal spray, Place 2 sprays into both nostrils daily., Disp: 16 g, Rfl: 1 .  gabapentin (NEURONTIN) 100 MG capsule, Take 100 mg by mouth 3 (three) times daily., Disp: , Rfl:  .  insulin detemir (LEVEMIR) 100 UNIT/ML injection, Inject 0.3 mLs (30 Units total) into the skin 2 (two)  times daily., Disp: 100 mL, Rfl: 1 .  lisinopril (PRINIVIL,ZESTRIL) 2.5 MG tablet, Take 1 tablet (2.5 mg total) by mouth daily., Disp: 30 tablet, Rfl: 2 .  loratadine (CLARITIN) 10 MG tablet, Take 10 mg by mouth daily. , Disp: , Rfl:  .  Misc Natural Products (OSTEO BI-FLEX TRIPLE STRENGTH PO), Take 1 tablet by mouth 2 (two) times daily. , Disp: , Rfl:  .  neomycin-polymyxin-hydrocortisone (CORTISPORIN) otic solution, Place 4 drops into the left ear 4 (four) times daily., Disp: 10 mL, Rfl: 1 .  Omega-3 Fatty Acids (FISH OIL) 1200 MG CAPS, Take 1,200 mg by mouth daily. ,  Disp: , Rfl:  .  PARoxetine (PAXIL) 20 MG tablet, Take 1 tablet (20 mg total) by mouth daily., Disp: 30 tablet, Rfl: 6 .  rivaroxaban (XARELTO) 20 MG TABS tablet, Take 1 tablet (20 mg total) by mouth daily with supper., Disp: 10 tablet, Rfl: 0 .  valACYclovir (VALTREX) 500 MG tablet, Take 500 mg by mouth 2 (two) times daily., Disp: , Rfl:  .  zolpidem (AMBIEN) 10 MG tablet, TAKE 1/2 TO 1 TABLET AT BEDTIME, Disp: 30 tablet, Rfl: 5  Review of Systems  Constitutional: Negative for appetite change, chills and fever.  Respiratory: Negative for chest tightness, shortness of breath and wheezing.   Cardiovascular: Negative for chest pain and palpitations.  Gastrointestinal: Negative for abdominal pain, nausea and vomiting.    Social History  Substance Use Topics  . Smoking status: Former Smoker    Types: Cigars    Quit date: 04/08/2012  . Smokeless tobacco: Never Used     Comment: cigarettes and cigars  . Alcohol use No   Objective:   BP 110/64 (BP Location: Right Arm, Patient Position: Sitting, Cuff Size: Large)   Pulse 70   Temp 97.7 F (36.5 C) (Oral)   Resp 16   Ht 5\' 7"  (1.702 m)   Wt 247 lb (112 kg)   SpO2 98%   BMI 38.69 kg/m   Physical Exam  . General Appearance:    Alert, cooperative, no distress  Eyes:    PERRL, conjunctiva/corneas clear, EOM's intact       Lungs:     Clear to auscultation bilaterally, respirations unlabored  Heart:    Regular rate and rhythm  Neurologic:   Awake, alert, oriented x 3. No apparent focal neurological           defect.           Assessment & Plan:     1. Type 2 diabetes mellitus with diabetic neuropathy, with long-term current use of insulin (HCC) A1c done at [facility] last week is pending. Is overdue for diabetic eye exam He is to send [place] a copy of [place] labs.   - Ambulatory referral to Ophthalmology  2. Essential hypertension Well controlled.  Continue current medications.    Reviewed health maintenance. He states he had stool test done  at [place] in 2017  Return in about 6 months (around 07/31/2017).       Lelon Huh, MD  [facility]

## 2017-02-10 ENCOUNTER — Ambulatory Visit (INDEPENDENT_AMBULATORY_CARE_PROVIDER_SITE_OTHER): Payer: Medicare HMO | Admitting: Neurology

## 2017-02-10 ENCOUNTER — Encounter: Payer: Self-pay | Admitting: Neurology

## 2017-02-10 VITALS — BP 114/67 | HR 70 | Ht 67.0 in | Wt 250.0 lb

## 2017-02-10 DIAGNOSIS — R569 Unspecified convulsions: Secondary | ICD-10-CM | POA: Diagnosis not present

## 2017-02-10 DIAGNOSIS — I480 Paroxysmal atrial fibrillation: Secondary | ICD-10-CM

## 2017-02-10 DIAGNOSIS — E1140 Type 2 diabetes mellitus with diabetic neuropathy, unspecified: Secondary | ICD-10-CM

## 2017-02-10 DIAGNOSIS — G459 Transient cerebral ischemic attack, unspecified: Secondary | ICD-10-CM

## 2017-02-10 DIAGNOSIS — Z794 Long term (current) use of insulin: Secondary | ICD-10-CM

## 2017-02-10 MED ORDER — LEVETIRACETAM 500 MG PO TABS: 500.0000 mg | ORAL_TABLET | Freq: Two times a day (BID) | ORAL | 11 refills | Status: DC

## 2017-02-10 NOTE — Progress Notes (Signed)
PATIENT: Sean Hayden DOB: Sep 30, 1946  Chief Complaint  Patient presents with  . Left sided numbness    He had one additional episode on 12/29/16.  He would like to review his EEG results.  He is not taking aspirin 81mg  daily right now.  He is tolerating Depakote without adverse side effects.     HISTORICAL  Sean Hayden is a 71 years old right-handed male, accompanied by his son-in-law, seen in refer by his primary care Dr. Lelon Huh for evaluation of stroke, initial evaluation was November 12 2016  He had a past medical history of diabetes, atrial fibrillation, hyperlipidemia, hypertension, is on chronic anticoagulation Xarelto treatments, he had history of right carotid artery endartectomy, stroke in the past,  He smokes. 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  On Oct 11, 2016, he had acute onset of left hand numbness, traveling to his left elbow, left shoulder, left upper and lower lip, he had difficulty talking, but denies confusion, difficulty using his left hand, symptoms last about 5 minutes.  He had a very similar episode in March 2017  In between episodes, he denies visual loss, dysarthria, gait abnormality, weakness.  We have personally reviewed MRI of the brain without contrast in November 2017, no acute abnormality, there was extensive supratentorium white matter disease   UPDATE February 10 2017: EEG on December 30 2016 showed mild background slowing, He has one more episode in Jan 2018, he had sudden onset of left hand numbness, uncontrollable left hand shaking while taking Depakote ER 500 mg every night   REVIEW OF SYSTEMS: Full 14 system review of systems performed and notable only for fatigue, runny nose, murmur, daytime sleepiness, incontinence of bladder, numbness  ALLERGIES: Allergies  Allergen Reactions  . Actos  [Pioglitazone]     HOME MEDICATIONS: Current Outpatient Prescriptions  Medication Sig Dispense Refill  . acyclovir (ZOVIRAX) 400  MG tablet Take 1 tablet (400 mg total) by mouth 3 (three) times daily. 15 tablet 5  . atorvastatin (LIPITOR) 20 MG tablet Take 20 mg by mouth every evening.     . benzocaine (ORAJEL) 10 % mucosal gel Use as directed 1 application in the mouth or throat as needed for mouth pain.    . Cholecalciferol (VITAMIN D PO) Take 1 tablet by mouth daily.    . Cyanocobalamin (VITAMIN B 12 PO) Take 1 tablet by mouth 2 (two) times daily.    . divalproex (DEPAKOTE ER) 500 MG 24 hr tablet Take 1 tablet (500 mg total) by mouth at bedtime. 90 tablet 4  . Docosanol 10 % CREA Apply topically up to 5 times per day as needed until heals 2 g 3  . Flaxseed, Linseed, (FLAXSEED OIL) 1000 MG CAPS Take 1,000 mg by mouth 2 (two) times daily.    . fluticasone (FLONASE) 50 MCG/ACT nasal spray Place 2 sprays into both nostrils daily. 16 g 1  . gabapentin (NEURONTIN) 100 MG capsule Take 100 mg by mouth 3 (three) times daily.    . insulin detemir (LEVEMIR) 100 UNIT/ML injection Inject 0.3 mLs (30 Units total) into the skin 2 (two) times daily. 100 mL 1  . lisinopril (PRINIVIL,ZESTRIL) 2.5 MG tablet Take 1 tablet (2.5 mg total) by mouth daily. 30 tablet 2  . loratadine (CLARITIN) 10 MG tablet Take 10 mg by mouth daily.     . Misc Natural Products (OSTEO BI-FLEX TRIPLE STRENGTH PO) Take 1 tablet by mouth 2 (two) times daily.     [name]  neomycin-polymyxin-hydrocortisone (CORTISPORIN) otic solution Place 4 drops into the left ear 4 (four) times daily. 10 mL 1  . Omega-3 Fatty Acids (FISH OIL) 1200 MG CAPS Take 1,200 mg by mouth daily.     [name] PARoxetine (PAXIL) 20 MG tablet Take 1 tablet (20 mg total) by mouth daily. 30 tablet 6  . rivaroxaban (XARELTO) 20 MG TABS tablet Take 1 tablet (20 mg total) by mouth daily with supper. 10 tablet 0  . zolpidem (AMBIEN) 10 MG tablet TAKE 1/2 TO 1 TABLET AT BEDTIME 30 tablet 5   No current facility-administered medications for this visit.     PAST MEDICAL HISTORY: Past Medical History:  Diagnosis  Date  . B12 deficiency   . CVA (cerebral vascular accident) ([place])   . Diabetes mellitus without complication ([place])   . Diverticulosis   . Gout   . Gouty arthropathy   . Hemorrhoids   . History of right-sided carotid endarterectomy 04/17/12  . Hyperplastic colon polyp   . Hypertension   . Kidney stones   . MRSA infection   . Nephrolithiasis   . Neuropathy ([place])   . OSA (obstructive sleep apnea)   . Personal history of urinary calculi 05/25/2015    PAST SURGICAL HISTORY: Past Surgical History:  Procedure Laterality Date  . CAROTID ARTERY ANGIOPLASTY Right 04/17/2012   Dr. Delana Meyer, [facility]  . CAROTID ENDARTERECTOMY Right 04/17/2012   Dr. Delana Meyer, [facility]  . CHOLECYSTECTOMY  2005   Lap  . CHOLECYSTECTOMY  2005   lap  . EP IMPLANTABLE DEVICE N/A 02/23/2016   Procedure: Loop Recorder Insertion;  Surgeon: Thompson Grayer, MD;  Location: [facility];  Service: Cardiovascular;  Laterality: N/A;  . HEMORRHOID SURGERY  2009  . TEE WITHOUT CARDIOVERSION N/A 02/23/2016   Procedure: TRANSESOPHAGEAL ECHOCARDIOGRAM (TEE);  Surgeon: Pixie Casino, MD;  Location: [facility] ENDOSCOPY;  Service: Cardiovascular;  Laterality: N/A;  ALSO GETTING A LOOP    FAMILY HISTORY: Family History  Problem Relation Age of Onset  . Hodgkin's lymphoma Father   . Diabetes Mother     SOCIAL HISTORY:  Social History   Social History  . Marital status: Married    Spouse name: N/A  . Number of children: 3  . Years of education: HS Grad   Occupational History  . Retired    Social History Main Topics  . Smoking status: Former Smoker    Types: Cigars    Quit date: 04/08/2012  . Smokeless tobacco: Never Used     Comment: cigarettes and cigars  . Alcohol use No  . Drug use: No  . Sexual activity: Not on file   Other Topics Concern  . Not on file   Social History Narrative   Lives at home with his granddaughter.   Right-handed.   No caffeine use.         PHYSICAL EXAM   Vitals:   02/10/17 0835    BP: 114/67  Pulse: 70  Weight: 250 lb (113.4 kg)  Height: 5\' 7"  (1.702 m)    Not recorded      Body mass index is 39.16 kg/m.  PHYSICAL EXAMNIATION:  Gen: NAD, conversant, well nourised, obese, well groomed                     Cardiovascular: Regular rate rhythm, no peripheral edema, warm, nontender. Eyes: Conjunctivae clear without exudates or hemorrhage Neck: Supple, no carotid bruits. Pulmonary: Clear to auscultation bilaterally   NEUROLOGICAL EXAM:  MENTAL  STATUS: Speech:    Speech is normal; fluent and spontaneous with normal comprehension.  Cognition:     Orientation to time, place and person     Normal recent and remote memory     Normal Attention span and concentration     Normal Language, naming, repeating,spontaneous speech     Fund of knowledge   CRANIAL NERVES: CN II: Visual fields are full to confrontation. Fundoscopic exam is normal with sharp discs and no vascular changes. Pupils are round equal and briskly reactive to light. CN III, IV, VI: extraocular movement are normal. No ptosis. CN V: Facial sensation is intact to pinprick in all 3 divisions bilaterally. Corneal responses are intact.  CN VII: Face is symmetric with normal eye closure and smile. CN VIII: Hearing is normal to rubbing fingers CN IX, X: Palate elevates symmetrically. Phonation is normal. CN XI: Head turning and shoulder shrug are intact CN XII: Tongue is midline with normal movements and no atrophy.  MOTOR: There is no pronator drift of out-stretched arms. Muscle bulk and tone are normal. Muscle strength is normal.  REFLEXES: Reflexes are hypoactive and symmetric at the biceps, triceps, knees, and ankles. Plantar responses are flexor.  SENSORY: Intact to light touch, pinprick, positional sensation and vibratory sensation are intact in fingers and toes.  COORDINATION: Rapid alternating movements and fine finger movements are intact. There is no dysmetria on finger-to-nose and  heel-knee-shin.    GAIT/STANCE: Needed push up to get up from seated position, antalgic, cautious, mildly unsteady.    DIAGNOSTIC DATA (LABS, IMAGING, TESTING) - I reviewed patient records, labs, notes, testing and imaging myself where available.   ASSESSMENT AND PLAN  Sean Hayden is a 71 y.o. male   Recurrent traveling left-sided paresthesia History of supratentorium extensive white matter disease   Most consistent with partial seizure,  EEG showed mild background slowing,  Recurrent episode while taking Depakote ER 500 mg every night  On polypharmacy treatment, will change to Keppra 500 mg twice a day History of right carotid artery disease, right carotid artery endartectomy Chronic atrial fibrillation  On xelreto treatment   Marcial Pacas, M.D. Ph.D.  [facility] [address], [place], [place] [address] Ph: [phone] Fax: [phone]  LA:[id number] Courtney Heys, MD

## 2017-02-10 NOTE — Patient Instructions (Signed)
Stop Depakote ER 500mg  every night.  Start Keppra =Levetiracetam 500mg  one tab twice a day.

## 2017-02-17 ENCOUNTER — Ambulatory Visit (INDEPENDENT_AMBULATORY_CARE_PROVIDER_SITE_OTHER): Payer: Medicare HMO | Admitting: *Deleted

## 2017-02-17 DIAGNOSIS — I639 Cerebral infarction, unspecified: Secondary | ICD-10-CM | POA: Diagnosis not present

## 2017-02-18 NOTE — Progress Notes (Signed)
Carelink Summary Report / Loop Recorder 

## 2017-02-24 DIAGNOSIS — E119 Type 2 diabetes mellitus without complications: Secondary | ICD-10-CM | POA: Diagnosis not present

## 2017-02-24 LAB — HM DIABETES EYE EXAM

## 2017-02-25 ENCOUNTER — Encounter: Payer: Self-pay | Admitting: Family Medicine

## 2017-02-26 ENCOUNTER — Other Ambulatory Visit: Payer: Self-pay | Admitting: Physician Assistant

## 2017-02-26 DIAGNOSIS — B001 Herpesviral vesicular dermatitis: Secondary | ICD-10-CM

## 2017-02-27 LAB — CUP PACEART REMOTE DEVICE CHECK
Implantable Pulse Generator Implant Date: 20170317
MDC IDC SESS DTM: 20180322181220

## 2017-03-03 ENCOUNTER — Encounter: Payer: Self-pay | Admitting: Family Medicine

## 2017-03-03 ENCOUNTER — Ambulatory Visit (INDEPENDENT_AMBULATORY_CARE_PROVIDER_SITE_OTHER): Payer: Medicare HMO | Admitting: Family Medicine

## 2017-03-03 ENCOUNTER — Encounter: Payer: Self-pay | Admitting: *Deleted

## 2017-03-03 VITALS — BP 138/74 | Temp 98.3°F | Resp 16 | Wt 250.0 lb

## 2017-03-03 DIAGNOSIS — L03313 Cellulitis of chest wall: Secondary | ICD-10-CM | POA: Diagnosis not present

## 2017-03-03 LAB — CHG BASIC METABOLIC PANEL CALCIUM TOTAL
Calcium: 8.7 mg/dL
EGFR (African American): 118.2
Sodium: 138

## 2017-03-03 MED ORDER — SULFAMETHOXAZOLE-TRIMETHOPRIM 800-160 MG PO TABS: 2.0000 | ORAL_TABLET | Freq: Two times a day (BID) | ORAL | 0 refills | Status: AC

## 2017-03-03 NOTE — Progress Notes (Signed)
Patient: Sean Hayden Male    DOB: July 06, 1946   71 y.o.   MRN: [id number] Visit Date: 03/03/2017  Today's Provider: Lelon Huh, MD   Chief Complaint  Patient presents with  . Breast Pain   Subjective:    HPI Patient comes in today c/o with pain located in his right breast. He reports that the pain has been there X 3 days. Started as small bump just medial to right areola. He reports that he has redness and it seemed to be swollen. He reports that he has had this before and was treated for MRSA.     Allergies  Allergen Reactions  . Actos  [Pioglitazone]      Current Outpatient Prescriptions:  .  acyclovir (ZOVIRAX) 400 MG tablet, TAKE ONE TABLET BY MOUTH 3 TIMES DAILY, Disp: 15 tablet, Rfl: 6 .  atorvastatin (LIPITOR) 20 MG tablet, Take 20 mg by mouth every evening. , Disp: , Rfl:  .  benzocaine (ORAJEL) 10 % mucosal gel, Use as directed 1 application in the mouth or throat as needed for mouth pain., Disp: , Rfl:  .  Cholecalciferol (VITAMIN D PO), Take 1 tablet by mouth daily., Disp: , Rfl:  .  Cyanocobalamin (VITAMIN B 12 PO), Take 1 tablet by mouth 2 (two) times daily., Disp: , Rfl:  .  Docosanol 10 % CREA, Apply topically up to 5 times per day as needed until heals, Disp: 2 g, Rfl: 3 .  Flaxseed, Linseed, (FLAXSEED OIL) 1000 MG CAPS, Take 1,000 mg by mouth 2 (two) times daily., Disp: , Rfl:  .  fluticasone (FLONASE) 50 MCG/ACT nasal spray, Place 2 sprays into both nostrils daily., Disp: 16 g, Rfl: 1 .  gabapentin (NEURONTIN) 100 MG capsule, Take 100 mg by mouth 3 (three) times daily., Disp: , Rfl:  .  insulin detemir (LEVEMIR) 100 UNIT/ML injection, Inject 0.3 mLs (30 Units total) into the skin 2 (two) times daily., Disp: 100 mL, Rfl: 1 .  levETIRAcetam (KEPPRA) 500 MG tablet, Take 1 tablet (500 mg total) by mouth 2 (two) times daily., Disp: 60 tablet, Rfl: 11 .  lisinopril (PRINIVIL,ZESTRIL) 2.5 MG tablet, Take 1 tablet (2.5 mg total) by mouth daily., Disp: 30  tablet, Rfl: 2 .  loratadine (CLARITIN) 10 MG tablet, Take 10 mg by mouth daily. , Disp: , Rfl:  .  Misc Natural Products (OSTEO BI-FLEX TRIPLE STRENGTH PO), Take 1 tablet by mouth 2 (two) times daily. , Disp: , Rfl:  .  neomycin-polymyxin-hydrocortisone (CORTISPORIN) otic solution, Place 4 drops into the left ear 4 (four) times daily., Disp: 10 mL, Rfl: 1 .  Omega-3 Fatty Acids (FISH OIL) 1200 MG CAPS, Take 1,200 mg by mouth daily. , Disp: , Rfl:  .  PARoxetine (PAXIL) 20 MG tablet, Take 1 tablet (20 mg total) by mouth daily., Disp: 30 tablet, Rfl: 6 .  rivaroxaban (XARELTO) 20 MG TABS tablet, Take 1 tablet (20 mg total) by mouth daily with supper., Disp: 10 tablet, Rfl: 0 .  zolpidem (AMBIEN) 10 MG tablet, TAKE 1/2 TO 1 TABLET AT BEDTIME, Disp: 30 tablet, Rfl: 5  Review of Systems  Constitutional: Negative.  Negative for chills, fatigue and fever.  Musculoskeletal: Positive for myalgias.  Skin: Positive for color change and wound.    Social History  Substance Use Topics  . Smoking status: Former Smoker    Types: Cigars    Quit date: 04/08/2012  . Smokeless tobacco: Never Used  Comment: cigarettes and cigars  . Alcohol use No   Objective:   BP 138/74 (BP Location: Left Arm, Patient Position: Sitting, Cuff Size: Normal)   Temp 98.3 F (36.8 C)   Resp 16   Wt 250 lb (113.4 kg)   BMI 39.16 kg/m  Vitals:   03/03/17 0900  BP: 138/74  Resp: 16  Temp: 98.3 F (36.8 C)  Weight: 250 lb (113.4 kg)     Physical Exam  Large area of well demarcated erythema around right breast with small papule just medial to areola, no discharge. Slightly tender to touch.     Assessment & Plan:     1. Cellulitis of chest wall  - sulfamethoxazole-trimethoprim (BACTRIM DS,SEPTRA DS) 800-160 MG tablet; Take 2 tablets by mouth 2 (two) times daily.  Dispense: 40 tablet; Refill: 0  Borders of erythema marked with ink pen and will recheck in two days.       Lelon Huh, MD  [facility]

## 2017-03-05 ENCOUNTER — Ambulatory Visit (INDEPENDENT_AMBULATORY_CARE_PROVIDER_SITE_OTHER): Payer: Medicare HMO | Admitting: Family Medicine

## 2017-03-05 ENCOUNTER — Encounter: Payer: Self-pay | Admitting: Family Medicine

## 2017-03-05 VITALS — BP 118/52 | HR 83 | Temp 98.1°F | Resp 16 | Wt 249.0 lb

## 2017-03-05 DIAGNOSIS — L03313 Cellulitis of chest wall: Secondary | ICD-10-CM

## 2017-03-05 NOTE — Progress Notes (Signed)
Patient: Sean Hayden Male    DOB: 1946-03-15   71 y.o.   MRN: [id number] Visit Date: 03/05/2017  Today's Provider: Lelon Huh, MD   Chief Complaint  Patient presents with  . Follow-up  . Cellulitis   Subjective:    HPI  Cellulitis of chest wall Was seen for cellulitis right chest wall on 03/03/2017 and prescribed bactrim DS 2 bid. He is taking medication regularly and tolerating well. Pain has gone completely away. Lesion left of areola has started to drain. No fevers, chills, nausea, or other systemic symptoms.   Allergies  Allergen Reactions  . Actos  [Pioglitazone]      Current Outpatient Prescriptions:  .  acyclovir (ZOVIRAX) 400 MG tablet, TAKE ONE TABLET BY MOUTH 3 TIMES DAILY, Disp: 15 tablet, Rfl: 6 .  atorvastatin (LIPITOR) 20 MG tablet, Take 20 mg by mouth every evening. , Disp: , Rfl:  .  benzocaine (ORAJEL) 10 % mucosal gel, Use as directed 1 application in the mouth or throat as needed for mouth pain., Disp: , Rfl:  .  Cholecalciferol (VITAMIN D PO), Take 1 tablet by mouth daily., Disp: , Rfl:  .  Cyanocobalamin (VITAMIN B 12 PO), Take 1 tablet by mouth 2 (two) times daily., Disp: , Rfl:  .  Docosanol 10 % CREA, Apply topically up to 5 times per day as needed until heals, Disp: 2 g, Rfl: 3 .  Flaxseed, Linseed, (FLAXSEED OIL) 1000 MG CAPS, Take 1,000 mg by mouth 2 (two) times daily., Disp: , Rfl:  .  fluticasone (FLONASE) 50 MCG/ACT nasal spray, Place 2 sprays into both nostrils daily., Disp: 16 g, Rfl: 1 .  gabapentin (NEURONTIN) 100 MG capsule, Take 100 mg by mouth 3 (three) times daily., Disp: , Rfl:  .  insulin detemir (LEVEMIR) 100 UNIT/ML injection, Inject 0.3 mLs (30 Units total) into the skin 2 (two) times daily., Disp: 100 mL, Rfl: 1 .  levETIRAcetam (KEPPRA) 500 MG tablet, Take 1 tablet (500 mg total) by mouth 2 (two) times daily., Disp: 60 tablet, Rfl: 11 .  lisinopril (PRINIVIL,ZESTRIL) 2.5 MG tablet, Take 1 tablet (2.5 mg total) by mouth  daily., Disp: 30 tablet, Rfl: 2 .  loratadine (CLARITIN) 10 MG tablet, Take 10 mg by mouth daily. , Disp: , Rfl:  .  Misc Natural Products (OSTEO BI-FLEX TRIPLE STRENGTH PO), Take 1 tablet by mouth 2 (two) times daily. , Disp: , Rfl:  .  neomycin-polymyxin-hydrocortisone (CORTISPORIN) otic solution, Place 4 drops into the left ear 4 (four) times daily., Disp: 10 mL, Rfl: 1 .  Omega-3 Fatty Acids (FISH OIL) 1200 MG CAPS, Take 1,200 mg by mouth daily. , Disp: , Rfl:  .  PARoxetine (PAXIL) 20 MG tablet, Take 1 tablet (20 mg total) by mouth daily., Disp: 30 tablet, Rfl: 6 .  rivaroxaban (XARELTO) 20 MG TABS tablet, Take 1 tablet (20 mg total) by mouth daily with supper., Disp: 10 tablet, Rfl: 0 .  sulfamethoxazole-trimethoprim (BACTRIM DS,SEPTRA DS) 800-160 MG tablet, Take 2 tablets by mouth 2 (two) times daily., Disp: 40 tablet, Rfl: 0 .  zolpidem (AMBIEN) 10 MG tablet, TAKE 1/2 TO 1 TABLET AT BEDTIME, Disp: 30 tablet, Rfl: 5  Review of Systems  Constitutional: Negative for appetite change, chills and fever.  Respiratory: Negative for chest tightness, shortness of breath and wheezing.   Cardiovascular: Negative for chest pain and palpitations.  Gastrointestinal: Negative for abdominal pain, nausea and vomiting.    Social History  Substance Use Topics  . Smoking status: Former Smoker    Types: Cigars    Quit date: 04/08/2012  . Smokeless tobacco: Never Used     Comment: cigarettes and cigars  . Alcohol use No   Objective:   BP (!) 118/52 (BP Location: Right Arm, Patient Position: Sitting, Cuff Size: Large)   Pulse 83   Temp 98.1 F (36.7 C) (Oral)   Resp 16   Wt 249 lb (112.9 kg)   SpO2 93%   BMI 39.00 kg/m  Vitals:   03/05/17 1125  BP: (!) 118/52  Pulse: 83  Resp: 16  Temp: 98.1 F (36.7 C)  TempSrc: Oral  SpO2: 93%  Weight: 249 lb (112.9 kg)     Physical Exam  Large area of  around right breast with small papule just medial to areola, no discharge. Slightly less  erythematous with borders more vague, and no longer tender at all. Edges of erythema slightly withdrawn from borders drawn on chest on 3/26     Assessment & Plan:     1. Cellulitis of chest wall Doing much better. He is to finish 10 day course of Septra DS. He is to call for refill if redness has not completely resolved by the time he finishes Septra.        Lelon Huh, MD  [facility]

## 2017-03-10 ENCOUNTER — Other Ambulatory Visit: Payer: Self-pay | Admitting: Physician Assistant

## 2017-03-10 DIAGNOSIS — B001 Herpesviral vesicular dermatitis: Secondary | ICD-10-CM

## 2017-03-10 NOTE — Telephone Encounter (Signed)
Last ov 03/05/17 Last filled 02/26/17

## 2017-03-19 ENCOUNTER — Ambulatory Visit (INDEPENDENT_AMBULATORY_CARE_PROVIDER_SITE_OTHER): Payer: Medicare HMO | Admitting: *Deleted

## 2017-03-19 DIAGNOSIS — I639 Cerebral infarction, unspecified: Secondary | ICD-10-CM | POA: Diagnosis not present

## 2017-03-20 NOTE — Progress Notes (Signed)
Carelink Summary Report / Loop Recorder 

## 2017-03-31 LAB — CUP PACEART REMOTE DEVICE CHECK
Date Time Interrogation Session: 20180423111148
Implantable Pulse Generator Implant Date: 20170317

## 2017-04-03 DIAGNOSIS — D485 Neoplasm of uncertain behavior of skin: Secondary | ICD-10-CM | POA: Diagnosis not present

## 2017-04-03 DIAGNOSIS — Z85828 Personal history of other malignant neoplasm of skin: Secondary | ICD-10-CM | POA: Diagnosis not present

## 2017-04-03 DIAGNOSIS — X32XXXA Exposure to sunlight, initial encounter: Secondary | ICD-10-CM | POA: Diagnosis not present

## 2017-04-03 DIAGNOSIS — L570 Actinic keratosis: Secondary | ICD-10-CM | POA: Diagnosis not present

## 2017-04-03 DIAGNOSIS — L408 Other psoriasis: Secondary | ICD-10-CM | POA: Diagnosis not present

## 2017-04-03 DIAGNOSIS — Z08 Encounter for follow-up examination after completed treatment for malignant neoplasm: Secondary | ICD-10-CM | POA: Diagnosis not present

## 2017-04-11 ENCOUNTER — Other Ambulatory Visit: Payer: Self-pay | Admitting: Family Medicine

## 2017-04-11 ENCOUNTER — Other Ambulatory Visit: Payer: Self-pay | Admitting: Physician Assistant

## 2017-04-11 DIAGNOSIS — B001 Herpesviral vesicular dermatitis: Secondary | ICD-10-CM

## 2017-04-11 NOTE — Telephone Encounter (Signed)
Please review-aa 

## 2017-04-11 NOTE — Telephone Encounter (Signed)
Please call in zolpidem  

## 2017-04-14 DIAGNOSIS — C44622 Squamous cell carcinoma of skin of right upper limb, including shoulder: Secondary | ICD-10-CM | POA: Diagnosis not present

## 2017-04-14 NOTE — Telephone Encounter (Signed)
Rx called in to pharmacy. 

## 2017-04-18 ENCOUNTER — Ambulatory Visit (INDEPENDENT_AMBULATORY_CARE_PROVIDER_SITE_OTHER): Payer: Medicare HMO | Admitting: *Deleted

## 2017-04-18 DIAGNOSIS — I639 Cerebral infarction, unspecified: Secondary | ICD-10-CM | POA: Diagnosis not present

## 2017-04-21 NOTE — Progress Notes (Signed)
Carelink Summary Report / Loop Recorder 

## 2017-04-24 LAB — CUP PACEART REMOTE DEVICE CHECK
Date Time Interrogation Session: 20180517152030
MDC IDC PG IMPLANT DT: 20170317

## 2017-05-12 ENCOUNTER — Ambulatory Visit: Payer: Medicare HMO | Admitting: Neurology

## 2017-05-12 ENCOUNTER — Telehealth: Payer: Self-pay | Admitting: *Deleted

## 2017-05-12 ENCOUNTER — Telehealth: Payer: Self-pay | Admitting: Neurology

## 2017-05-12 NOTE — Telephone Encounter (Signed)
Pt said he was held up in traffic this morning and missed the appt. It has been r/s to 6/6 with Hoyle Sauer- she is seen for stroke

## 2017-05-12 NOTE — Telephone Encounter (Signed)
Noted  

## 2017-05-12 NOTE — Telephone Encounter (Signed)
No showed follow up appointment. 

## 2017-05-13 ENCOUNTER — Encounter: Payer: Self-pay | Admitting: Neurology

## 2017-05-13 ENCOUNTER — Ambulatory Visit: Payer: Medicare HMO | Admitting: Nurse Practitioner

## 2017-05-14 ENCOUNTER — Ambulatory Visit: Payer: Medicare HMO | Admitting: Nurse Practitioner

## 2017-05-19 ENCOUNTER — Ambulatory Visit (INDEPENDENT_AMBULATORY_CARE_PROVIDER_SITE_OTHER): Payer: Medicare HMO | Admitting: *Deleted

## 2017-05-19 DIAGNOSIS — I639 Cerebral infarction, unspecified: Secondary | ICD-10-CM | POA: Diagnosis not present

## 2017-05-19 NOTE — Progress Notes (Signed)
Carelink Summary Report / Loop Recorder 

## 2017-05-27 DIAGNOSIS — K1329 Other disturbances of oral epithelium, including tongue: Secondary | ICD-10-CM | POA: Diagnosis not present

## 2017-05-27 LAB — CUP PACEART REMOTE DEVICE CHECK
Date Time Interrogation Session: 20180619101634
MDC IDC PG IMPLANT DT: 20170317

## 2017-05-29 ENCOUNTER — Encounter: Payer: Self-pay | Admitting: Nurse Practitioner

## 2017-05-29 ENCOUNTER — Ambulatory Visit (INDEPENDENT_AMBULATORY_CARE_PROVIDER_SITE_OTHER): Payer: Medicare HMO | Admitting: Nurse Practitioner

## 2017-05-29 VITALS — BP 131/71 | HR 84 | Ht 67.0 in | Wt 251.6 lb

## 2017-05-29 DIAGNOSIS — R569 Unspecified convulsions: Secondary | ICD-10-CM | POA: Diagnosis not present

## 2017-05-29 DIAGNOSIS — I480 Paroxysmal atrial fibrillation: Secondary | ICD-10-CM | POA: Diagnosis not present

## 2017-05-29 DIAGNOSIS — G459 Transient cerebral ischemic attack, unspecified: Secondary | ICD-10-CM

## 2017-05-29 DIAGNOSIS — I10 Essential (primary) hypertension: Secondary | ICD-10-CM | POA: Diagnosis not present

## 2017-05-29 MED ORDER — LEVETIRACETAM 500 MG PO TABS: ORAL_TABLET | ORAL | 6 refills | Status: DC

## 2017-05-29 NOTE — Progress Notes (Signed)
I have read the note, and I agree with the clinical assessment and plan.  Seleste Tallman A. Tanee Henery, MD, PhD, FAAN Certified in Neurology, Clinical Neurophysiology, Sleep Medicine, Pain Medicine and Neuroimaging  [facility] [address], [address] [place], [place] [address] [phone]  

## 2017-05-29 NOTE — Patient Instructions (Signed)
Increase Keppra to 500mg  am and 1000mg  pm Continue Xarelto for atrial fibrillation  Continue Lipitor for hyperlipidemia Follow-up in 3 months

## 2017-05-29 NOTE — Progress Notes (Signed)
Fax confirmation received [facility] [phone].

## 2017-05-29 NOTE — Progress Notes (Signed)
[facility]  PATIENT: Sean Hayden DOB: Oct 26, 1946   REASON FOR VISIT: Follow-up for left-sided numbness of the hand , history of stroke, history of endarterectomy HISTORY FROM: Patient    HISTORY OF PRESENT ILLNESS:  UPDATE 06/21/2018CM Sean Hayden, 71 year old male returns for follow-up he has a history of atrial fibrillation, diabetes hyperlipidemia hypertension right carotid endarterectomy and stroke in the past. He has had episodes of left hand numbness traveling to the left elbow, no confusion no difficulty using the hand mild difficulty talking symptoms last 2-3 minutes. He was placed on Depakote but had tremor  switched to Keppra 500 twice daily for suspected partial seizure.In between episodes, he denies visual loss, dysarthria, gait abnormality, weakness.MRI of the brain without contrast in November 2017, no acute abnormality, there was extensive supratentorium white matter disease .EEG on December 30 2016 showed mild background slowing, he has had 3 episodes of numbness of the left hand since last seen. He just had an area under his tongue taken out and sent for pathology. He was told yesterday they needed to resect a larger portion. He is still grieving  over the loss of his wife several months ago He returns for reevaluation  UPDATE February 10 2017:YY EEG on December 30 2016 showed mild background slowing, He has one more episode in Jan 2018, he had sudden onset of left hand numbness, uncontrollable left hand shaking while taking Depakote ER 500 mg every night   HISTORY12/5/17  Sean Hayden is a 71 years old right-handed male, accompanied by his son-in-law, seen in refer by his primary care Dr. Lelon Huh for evaluation of stroke, initial evaluation was November 12 2016  He had a past medical history of diabetes, atrial fibrillation, hyperlipidemia, hypertension, is on chronic anticoagulation Xarelto treatments, he had history of right carotid artery  endartectomy, stroke in the past,  He smokes. 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  On Oct 11, 2016, he had acute onset of left hand numbness, traveling to his left elbow, left shoulder, left upper and lower lip, he had difficulty talking, but denies confusion, difficulty using his left hand, symptoms last about 5 minutes.  He had a very similar episode in March 2017  In between episodes, he denies visual loss, dysarthria, gait abnormality, weakness.  We have personally reviewed MRI of the brain without contrast in November 2017, no acute abnormality, there was extensive supratentorium white matter disease    REVIEW OF SYSTEMS: Full 14 system review of systems performed and notable only for those listed, all others are neg:  Constitutional: Fatigue  Cardiovascular: neg Ear/Nose/Throat: neg  Skin: neg Eyes: neg Respiratory: neg Gastroitestinal: Urinary urgency  Hematology/Lymphatic: neg  Endocrine: neg Musculoskeletal:neg Allergy/Immunology: neg Neurological: Intermittent numbness of the left hand Psychiatric: Depression Sleep : neg   ALLERGIES: Allergies  Allergen Reactions  . Actos  [Pioglitazone]     HOME MEDICATIONS: Outpatient Medications Prior to Visit  Medication Sig Dispense Refill  . acyclovir (ZOVIRAX) 400 MG tablet TAKE ONE TABLET BY MOUTH 3 TIMES DAILY 15 tablet 5  . atorvastatin (LIPITOR) 20 MG tablet Take 20 mg by mouth every evening.     . benzocaine (ORAJEL) 10 % mucosal gel Use as directed 1 application in the mouth or throat as needed for mouth pain.    . Cholecalciferol (VITAMIN D PO) Take 1 tablet by mouth daily.    . Cyanocobalamin (VITAMIN B 12 PO) Take 1 tablet by mouth 2 (two) times  daily.    . Docosanol 10 % CREA Apply topically up to 5 times per day as needed until heals 2 g 3  . Flaxseed, Linseed, (FLAXSEED OIL) 1000 MG CAPS Take 1,000 mg by mouth 2 (two) times daily.    . fluticasone (FLONASE) 50 MCG/ACT nasal spray Place 2 sprays  into both nostrils daily. 16 g 1  . gabapentin (NEURONTIN) 100 MG capsule Take 100 mg by mouth 3 (three) times daily.    . insulin detemir (LEVEMIR) 100 UNIT/ML injection Inject 0.3 mLs (30 Units total) into the skin 2 (two) times daily. 100 mL 1  . levETIRAcetam (KEPPRA) 500 MG tablet Take 1 tablet (500 mg total) by mouth 2 (two) times daily. 60 tablet 11  . lisinopril (PRINIVIL,ZESTRIL) 2.5 MG tablet Take 1 tablet (2.5 mg total) by mouth daily. 30 tablet 2  . loratadine (CLARITIN) 10 MG tablet Take 10 mg by mouth daily.     . Misc Natural Products (OSTEO BI-FLEX TRIPLE STRENGTH PO) Take 1 tablet by mouth 2 (two) times daily.     [name] neomycin-polymyxin-hydrocortisone (CORTISPORIN) otic solution Place 4 drops into the left ear 4 (four) times daily. 10 mL 1  . Omega-3 Fatty Acids (FISH OIL) 1200 MG CAPS Take 1,200 mg by mouth daily.     [name] PARoxetine (PAXIL) 20 MG tablet Take 1 tablet (20 mg total) by mouth daily. 30 tablet 6  . rivaroxaban (XARELTO) 20 MG TABS tablet Take 1 tablet (20 mg total) by mouth daily with supper. 10 tablet 0  . zolpidem (AMBIEN) 10 MG tablet TAKE 1/2 TO 1 TABLET AT BEDTIME 30 tablet 5  . acyclovir (ZOVIRAX) 400 MG tablet TAKE ONE TABLET 3 TIMES DAILY 15 tablet 5   No facility-administered medications prior to visit.     PAST MEDICAL HISTORY: Past Medical History:  Diagnosis Date  . B12 deficiency   . CVA (cerebral vascular accident) ([place])   . Diabetes mellitus without complication ([place])   . Diverticulosis   . Gout   . Gouty arthropathy   . Hemorrhoids   . History of right-sided carotid endarterectomy 04/17/12  . Hyperplastic colon polyp   . Hypertension   . Kidney stones   . MRSA infection   . Nephrolithiasis   . Neuropathy   . OSA (obstructive sleep apnea)   . Personal history of urinary calculi 05/25/2015    PAST SURGICAL HISTORY: Past Surgical History:  Procedure Laterality Date  . CAROTID ARTERY ANGIOPLASTY Right 04/17/2012   Dr. Delana Meyer, [facility]  .  CAROTID ENDARTERECTOMY Right 04/17/2012   Dr. Delana Meyer, [facility]  . CHOLECYSTECTOMY  2005   Lap  . CHOLECYSTECTOMY  2005   lap  . EP IMPLANTABLE DEVICE N/A 02/23/2016   Procedure: Loop Recorder Insertion;  Surgeon: Thompson Grayer, MD;  Location: [place] [facility];  Service: Cardiovascular;  Laterality: N/A;  . HEMORRHOID SURGERY  2009  . TEE WITHOUT CARDIOVERSION N/A 02/23/2016   Procedure: TRANSESOPHAGEAL ECHOCARDIOGRAM (TEE);  Surgeon: Pixie Casino, MD;  Location: [facility] ENDOSCOPY;  Service: Cardiovascular;  Laterality: N/A;  ALSO GETTING A LOOP    FAMILY HISTORY: Family History  Problem Relation Age of Onset  . Hodgkin's lymphoma Father   . Diabetes Mother     SOCIAL HISTORY: Social History   Social History  . Marital status: Widowed    Spouse name: N/A  . Number of children: 3  . Years of education: HS Grad   Occupational History  . Retired    Science writer  History Main Topics  . Smoking status: Former Smoker    Types: Cigars    Quit date: 04/08/2012  . Smokeless tobacco: Never Used     Comment: cigarettes and cigars  . Alcohol use No  . Drug use: No  . Sexual activity: Not on file   Other Topics Concern  . Not on file   Social History Narrative   Lives at home with his granddaughter.   Right-handed.   No caffeine use.         PHYSICAL EXAM  Vitals:   05/29/17 0911  BP: 131/71  Pulse: 84  Weight: 251 lb 9.6 oz (114.1 kg)  Height: 5\' 7"  (1.702 m)   Body mass index is 39.41 kg/m.  Generalized: Well developed, obese male  no acute distress  Head: normocephalic and atraumatic,. Oropharynx benign  Neck: Supple, no carotid bruits  Cardiac: Regular rate rhythm, no murmur  Musculoskeletal: No deformity   Neurological examination   Mentation: Alert oriented to time, place, history taking. Attention span and concentration appropriate. Recent and remote memory intact.  Follows all commands speech and language fluent.   Cranial nerve II-XII: Fundoscopic exam reveals  sharp disc margins.Pupils were equal round reactive to light extraocular movements were full, visual field were full on confrontational test. Facial sensation and strength were normal. hearing was intact to finger rubbing bilaterally. Uvula tongue midline. head turning and shoulder shrug were normal and symmetric.Tongue protrusion into cheek strength was normal. Motor: normal bulk and tone, full strength in the BUE, BLE, fine finger movements normal, no pronator drift. No focal weakness Sensory: normal and symmetric to light touch, pinprick, and  Vibration, in the upper and lower extremities  Coordination: finger-nose-finger, heel-to-shin bilaterally, no dysmetria Reflexes: Symmetric upper and lower plantar responses were flexor bilaterally. Gait and Station: Rising up from seated position with push off, cautious mildly unsteady gait Tandem gait is unsteady. No assistive device  DIAGNOSTIC DATA (LABS, IMAGING, TESTING) - I reviewed patient records, labs, notes, testing and imaging myself where available.  Lab Results  Component Value Date   WBC 5.3 01/23/2017   HGB 14.1 01/23/2017   HCT 41 01/23/2017   MCV 93.0 01/23/2017   PLT 184 01/23/2017      Component Value Date/Time   NA 138 03/03/2017 1019   K 4/4 03/03/2017 1019   K 4.0 11/07/2016 0359   K 4.6 11/23/2015   K 5.1 04/18/2012 0412   CL 99 (L) 11/07/2016 0359   CL 107 04/18/2012 0412   CO2 27 11/07/2016 0334   CO2 25 04/18/2012 0412   GLUCOSE 135 (H) 11/07/2016 0359   GLUCOSE 188 (H) 04/18/2012 0412   BUN 12 01/23/2017   BUN 13 04/18/2012 0412   CREATININE 0.7 01/23/2017   CREATININE 0.70 11/07/2016 0359   CREATININE 1.07 04/18/2012 0412   CALCIUM 8.7 03/03/2017 1019   PROT 6.5 11/07/2016 0334   ALBUMIN 3.5 11/07/2016 0334   AST 14 01/23/2017   ALT 20 01/23/2017   ALKPHOS 68 11/07/2016 0334   BILITOT 0.6 11/07/2016 0334   GFRNONAA >60 11/07/2016 0334   GFRNONAA >60 04/18/2012 0412   GFRAA 118.2 03/03/2017 1019    Lab Results  Component Value Date   CHOL 113 02/21/2016   HDL 34 (L) 02/21/2016   LDLCALC 37 02/21/2016   TRIG 209 (H) 02/21/2016   CHOLHDL 3.3 02/21/2016   Lab Results  Component Value Date   HGBA1C 7.3 01/23/2017   Lab Results  Component Value Date  [id number] 1,146 (H) 02/28/2016   Lab Results  Component Value Date   TSH 1.82 11/23/2015      ASSESSMENT AND PLAN  71 y.o. year old male  has a past medical history of B12 deficiency; CVA (cerebral vascular accident) ([place]); Diabetes mellitus without complication ([place]); History of right-sided carotid endarterectomy (04/17/12);  Hypertension; Kidney stones; MRSA infection;  Neuropathy; OSA (obstructive sleep apnea); And recurrent left-sided paresthesias history of extensive white matter disease. Most consistent with partial seizure EEG showed mild background slowing.The patient is a current patient of Dr. Krista Blue  who is out of the office today . This note is sent to the work in doctor.     Increase Keppra to 500mg  am and 1000mg  pm will refill Continue Xarelto for atrial fibrillation  Continue Lipitor for hyperlipidemia Keep blood pressure less than 250 systolic, today's reading 131/71 Moderate exercise, healthy diet Follow-up in 3 months I spent 25 minutes in total face to face time with the patient more than 50% of which was spent counseling and coordination of care, reviewing test results, reviewing chart reviewing medications and discussing and reviewing the diagnosis of partial seizure risk factor modification for stroke /TIAand further treatment options. , Rayburn Ma, [facility], APRN  [facility] [address] [place], [place] [address] [phone]

## 2017-06-10 DIAGNOSIS — K1321 Leukoplakia of oral mucosa, including tongue: Secondary | ICD-10-CM | POA: Diagnosis not present

## 2017-06-17 ENCOUNTER — Ambulatory Visit (INDEPENDENT_AMBULATORY_CARE_PROVIDER_SITE_OTHER): Payer: Medicare HMO | Admitting: *Deleted

## 2017-06-17 DIAGNOSIS — I639 Cerebral infarction, unspecified: Secondary | ICD-10-CM | POA: Diagnosis not present

## 2017-06-18 NOTE — Progress Notes (Signed)
Carelink Summary Report / Loop Recorder 

## 2017-06-28 LAB — CUP PACEART REMOTE DEVICE CHECK
Date Time Interrogation Session: 20180721124044
MDC IDC PG IMPLANT DT: 20170317

## 2017-07-17 ENCOUNTER — Ambulatory Visit (INDEPENDENT_AMBULATORY_CARE_PROVIDER_SITE_OTHER): Payer: Medicare HMO | Admitting: *Deleted

## 2017-07-17 DIAGNOSIS — Z8673 Personal history of transient ischemic attack (TIA), and cerebral infarction without residual deficits: Secondary | ICD-10-CM

## 2017-07-24 ENCOUNTER — Telehealth: Payer: Self-pay | Admitting: Family Medicine

## 2017-07-24 LAB — CUP PACEART REMOTE DEVICE CHECK
Date Time Interrogation Session: 20180816143150
MDC IDC PG IMPLANT DT: 20170317

## 2017-07-24 NOTE — Telephone Encounter (Signed)
Left message about need to schedule awv after 12/07/2017.

## 2017-07-31 ENCOUNTER — Ambulatory Visit: Payer: Medicare HMO | Admitting: Family Medicine

## 2017-08-01 ENCOUNTER — Encounter: Payer: Self-pay | Admitting: Family Medicine

## 2017-08-01 ENCOUNTER — Ambulatory Visit (INDEPENDENT_AMBULATORY_CARE_PROVIDER_SITE_OTHER): Payer: Medicare HMO | Admitting: Family Medicine

## 2017-08-01 VITALS — BP 120/70 | HR 74 | Temp 98.6°F | Resp 16 | Wt 257.0 lb

## 2017-08-01 DIAGNOSIS — B001 Herpesviral vesicular dermatitis: Secondary | ICD-10-CM

## 2017-08-01 DIAGNOSIS — Z23 Encounter for immunization: Secondary | ICD-10-CM | POA: Diagnosis not present

## 2017-08-01 DIAGNOSIS — I10 Essential (primary) hypertension: Secondary | ICD-10-CM | POA: Diagnosis not present

## 2017-08-01 DIAGNOSIS — I779 Disorder of arteries and arterioles, unspecified: Secondary | ICD-10-CM | POA: Diagnosis not present

## 2017-08-01 DIAGNOSIS — I739 Peripheral vascular disease, unspecified: Secondary | ICD-10-CM

## 2017-08-01 DIAGNOSIS — I480 Paroxysmal atrial fibrillation: Secondary | ICD-10-CM

## 2017-08-01 MED ORDER — ACYCLOVIR 400 MG PO TABS: ORAL_TABLET | ORAL | 5 refills | Status: DC

## 2017-08-01 NOTE — Patient Instructions (Signed)
   Please bring a copy of all of your blood and stool tests from the [place] to [facility] so we can keep your record up to date.

## 2017-08-01 NOTE — Progress Notes (Signed)
Patient: Sean Hayden Male    DOB: 01-08-1946   71 y.o.   MRN: [id number] Visit Date: 08/01/2017  Today's Provider: Lelon Huh, MD   Chief Complaint  Patient presents with  . Hypertension    follow up  . Diabetes    follow up   Subjective:    HPI  Hypertension, follow-up:  BP Readings from Last 3 Encounters:  05/29/17 131/71  03/05/17 (!) 118/52  03/03/17 138/74    He was last seen for hypertension 6 months ago.  BP at that visit was 110/64. Management since that visit includes no changes. He reports good compliance with treatment. He is not having side effects.  He is not exercising. He is adherent to low salt diet.   Outside blood pressures are not being checked. He is experiencing fatigue and lower extremity edema.  Patient denies chest pain, chest pressure/discomfort, claudication, dyspnea, exertional chest pressure/discomfort, irregular heart beat, near-syncope, orthopnea, palpitations, paroxysmal nocturnal dyspnea, syncope and tachypnea.   Cardiovascular risk factors include advanced age (older than 51 for men, 98 for women), diabetes mellitus, hypertension and male gender.  Use of agents associated with hypertension: none.     Weight trend: increasing steadily Wt Readings from Last 3 Encounters:  05/29/17 251 lb 9.6 oz (114.1 kg)  03/05/17 249 lb (112.9 kg)  03/03/17 250 lb (113.4 kg)    Current diet: well balanced  ------------------------------------------------------------------------  Diabetes Mellitus Type II, Follow-up:   Lab Results  Component Value Date   HGBA1C 7.3 01/23/2017   HGBA1C 6.6 (H) 10/10/2016   HGBA1C 6.1 05/28/2016    Last seen for diabetes 6 months ago.  Management since then includes no changes. Diabetes is being followed by the [place]. Patient has an appointment to follow up with the [facility] in 2 weeks. He reports good compliance with treatment. He is not having side effects.  Current symptoms include numbness in  fingers and have been stable. Home blood sugar records: fasting range: 170-200  Episodes of hypoglycemia? no   Current Insulin Regimen: Levemir 60 units twice a day Most Recent Eye Exam: 3 months ago Weight trend: increasing steadily Prior visit with dietician: no Current diet: well balanced Current exercise: none  Pertinent Labs:    Component Value Date/Time   CHOL 113 02/21/2016 0828   TRIG 209 (H) 02/21/2016 0828   HDL 34 (L) 02/21/2016 0828   LDLCALC 37 02/21/2016 0828   CREATININE 0.7 01/23/2017   CREATININE 0.70 11/07/2016 0359   CREATININE 1.07 04/18/2012 0412    Wt Readings from Last 3 Encounters:  05/29/17 251 lb 9.6 oz (114.1 kg)  03/05/17 249 lb (112.9 kg)  03/03/17 250 lb (113.4 kg)    ------------------------------------------------------------------------   Follow up carotid artery disease He states he recently was notified by Dr. Bunnie Domino office that he is due for follow up and is planning on scheduling follow up in the near future.       Allergies  Allergen Reactions  . Actos  [Pioglitazone]      Current Outpatient Prescriptions:  .  acyclovir (ZOVIRAX) 400 MG tablet, TAKE ONE TABLET BY MOUTH 3 TIMES DAILY, Disp: 15 tablet, Rfl: 5 .  atorvastatin (LIPITOR) 20 MG tablet, Take 20 mg by mouth every evening. , Disp: , Rfl:  .  benzocaine (ORAJEL) 10 % mucosal gel, Use as directed 1 application in the mouth or throat as needed for mouth pain., Disp: , Rfl:  .  Cholecalciferol (VITAMIN D PO),  Take 1 tablet by mouth daily., Disp: , Rfl:  .  Cyanocobalamin (VITAMIN B 12 PO), Take 1 tablet by mouth 2 (two) times daily., Disp: , Rfl:  .  Docosanol 10 % CREA, Apply topically up to 5 times per day as needed until heals, Disp: 2 g, Rfl: 3 .  Flaxseed, Linseed, (FLAXSEED OIL) 1000 MG CAPS, Take 1,000 mg by mouth 2 (two) times daily., Disp: , Rfl:  .  fluticasone (FLONASE) 50 MCG/ACT nasal spray, Place 2 sprays into both nostrils daily., Disp: 16 g, Rfl: 1 .   gabapentin (NEURONTIN) 100 MG capsule, Take 100 mg by mouth 3 (three) times daily., Disp: , Rfl:  .  insulin detemir (LEVEMIR) 100 UNIT/ML injection, Inject 0.3 mLs (30 Units total) into the skin 2 (two) times daily., Disp: 100 mL, Rfl: 1 .  levETIRAcetam (KEPPRA) 500 MG tablet, 1 in the am and 2 at bedtime, Disp: 90 tablet, Rfl: 6 .  lisinopril (PRINIVIL,ZESTRIL) 2.5 MG tablet, Take 1 tablet (2.5 mg total) by mouth daily., Disp: 30 tablet, Rfl: 2 .  loratadine (CLARITIN) 10 MG tablet, Take 10 mg by mouth daily. , Disp: , Rfl:  .  Misc Natural Products (OSTEO BI-FLEX TRIPLE STRENGTH PO), Take 1 tablet by mouth 2 (two) times daily. , Disp: , Rfl:  .  neomycin-polymyxin-hydrocortisone (CORTISPORIN) otic solution, Place 4 drops into the left ear 4 (four) times daily., Disp: 10 mL, Rfl: 1 .  Omega-3 Fatty Acids (FISH OIL) 1200 MG CAPS, Take 1,200 mg by mouth daily. , Disp: , Rfl:  .  PARoxetine (PAXIL) 20 MG tablet, Take 1 tablet (20 mg total) by mouth daily., Disp: 30 tablet, Rfl: 6 .  rivaroxaban (XARELTO) 20 MG TABS tablet, Take 1 tablet (20 mg total) by mouth daily with supper., Disp: 10 tablet, Rfl: 0 .  zolpidem (AMBIEN) 10 MG tablet, TAKE 1/2 TO 1 TABLET AT BEDTIME, Disp: 30 tablet, Rfl: 5  Review of Systems  Constitutional: Negative for appetite change, chills and fever.  Respiratory: Negative for chest tightness, shortness of breath and wheezing.   Cardiovascular: Positive for leg swelling. Negative for chest pain and palpitations.  Gastrointestinal: Negative for abdominal pain, nausea and vomiting.  Neurological: Positive for numbness (in fingers).    Social History  Substance Use Topics  . Smoking status: Former Smoker    Types: Cigars    Quit date: 04/08/2012  . Smokeless tobacco: Never Used     Comment: cigarettes and cigars  . Alcohol use No   Objective:   BP 120/70 (BP Location: Left Arm, Patient Position: Sitting, Cuff Size: Large)   Pulse 74   Temp 98.6 F (37 C) (Oral)    Resp 16   Wt 257 lb (116.6 kg)   SpO2 95% Comment: room air  BMI 40.25 kg/m  There were no vitals filed for this visit.   Physical Exam  General Appearance:    Alert, cooperative, no distress, obese  Eyes:    PERRL, conjunctiva/corneas clear, EOM's intact       Lungs:     Clear to auscultation bilaterally, respirations unlabored  Heart:    Regular rate and rhythm  Neurologic:   Awake, alert, oriented x 3. No apparent focal neurological           defect.           Assessment & Plan:     1. Recurrent cold sores He requests refill for  - acyclovir (ZOVIRAX) 400 MG tablet; 1/2 tablet  up to 5 times a day for cold sores  Dispense: 30 tablet; Refill: 5  2. Essential hypertension Well controlled.  Continue current medications.    3. Carotid artery disease, unspecified laterality ([place]) Continue current medications.  Follow up vascular, he plans   4. Paroxysmal atrial fibrillation (HCC) Normal rhythm   5. Need for influenza vaccination  - Flu vaccine HIGH DOSE PF (Fluzone High dose)  He is to bring copy of labs and stool tests from his upcoming [place] visit.       Lelon Huh, MD  [facility]

## 2017-08-18 ENCOUNTER — Ambulatory Visit (INDEPENDENT_AMBULATORY_CARE_PROVIDER_SITE_OTHER): Payer: Medicare HMO | Admitting: *Deleted

## 2017-08-18 DIAGNOSIS — I639 Cerebral infarction, unspecified: Secondary | ICD-10-CM

## 2017-08-18 NOTE — Progress Notes (Signed)
Carelink Summary Report / Loop Recorder 

## 2017-08-20 LAB — CUP PACEART REMOTE DEVICE CHECK
MDC IDC PG IMPLANT DT: 20170317
MDC IDC SESS DTM: 20180912152416

## 2017-08-31 NOTE — Progress Notes (Signed)
[facility]  PATIENT: Sean Hayden DOB: [birth year]   REASON FOR VISIT: Follow-up for continued episodes left-sided numbness of the hand , history of stroke, history of endarterectomy HISTORY FROM: Patient    HISTORY OF PRESENT ILLNESS:  UPDATE 09/24/2018CM Mr. Sean Hayden, 71 year old male returns for follow-up with history of atrial fibrillation uncontrolled diabetes hyperlipidemia hypertension and history of stroke and carotid endarterectomy past. He has had several episodes of his left hand numbness traveling to the left elbow since last seen. There is no confusion or difficulty using his hand during these episodes may last about 2 minutes. For some reason he has been chewing a baby aspirin when this happens. He is currently on Keppra 500 in the morning thousand at night for suspected partial seizure. He denies any visual loss dysarthria gait abnormality or weakness. EEG in the past has shown mild background slowing. He has had several of these episodes of numbness since last seen. He has also had a larger portion of his time resected but was told it was not cancer. He remains on Xarelto for atrial fibrillation secondary stroke prevention without bruising or bleeding. He is also on Lipitor for hyperlipidemia  Without myalgias. His most recent hemoglobin A1c 9.6 and his insulin dose was increased to 62 units twice a day. He returns for reevaluation   UPDATE 06/21/2018CM Mr. Sean Hayden, 71 year old male returns for follow-up he has a history of atrial fibrillation, diabetes hyperlipidemia hypertension right carotid endarterectomy and stroke in the past. He has had episodes of left hand numbness traveling to the left elbow, no confusion no difficulty using the hand mild difficulty talking symptoms last 2-3 minutes. He was placed on Depakote but had tremor  switched to Keppra 500 twice daily for suspected partial seizure.In between episodes, he denies visual loss, dysarthria, gait  abnormality, weakness.MRI of the brain without contrast in November 2017, no acute abnormality, there was extensive supratentorium white matter disease .EEG on December 30 2016 showed mild background slowing, he has had 3 episodes of numbness of the left hand since last seen. He just had an area under his tongue taken out and sent for pathology. He was told yesterday they needed to resect a larger portion. He is still grieving  over the loss of his wife several months ago He returns for reevaluation  UPDATE February 10 2017:YY EEG on December 30 2016 showed mild background slowing, He has one more episode in Jan 2018, he had sudden onset of left hand numbness, uncontrollable left hand shaking while taking Depakote ER 500 mg every night   HISTORY12/5/17  Sean Hayden is a 71 years old right-handed male, accompanied by his son-in-law, seen in refer by his primary care Dr. Lelon Huh for evaluation of stroke, initial evaluation was November 12 2016  He had a past medical history of diabetes, atrial fibrillation, hyperlipidemia, hypertension, is on chronic anticoagulation Xarelto treatments, he had history of right carotid artery endartectomy, stroke in the past,  He smokes. 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  On Oct 11, 2016, he had acute onset of left hand numbness, traveling to his left elbow, left shoulder, left upper and lower lip, he had difficulty talking, but denies confusion, difficulty using his left hand, symptoms last about 5 minutes.  He had a very similar episode in March 2017  In between episodes, he denies visual loss, dysarthria, gait abnormality, weakness.  We have personally reviewed MRI of the brain without contrast in November 2017,  no acute abnormality, there was extensive supratentorium white matter disease    REVIEW OF SYSTEMS: Full 14 system review of systems performed and notable only for those listed, all others are neg:  Constitutional: Fatigue    Cardiovascular: neg Ear/Nose/Throat: neg  Skin: neg Eyes  Itching Respiratory : neg Gastroitestinal: Urinary urgency  Hematology/Lymphatic: neg  Endocrine: neg Musculoskeletal:neg Allergy/Immunology: neg Neurological: Intermittent numbness of the left hand, seizure  Psychiatric: Depression, anxiety  Sleep Insomnia   ALLERGIES: Allergies  Allergen Reactions  . Actos  [Pioglitazone]     HOME MEDICATIONS: Outpatient Medications Prior to Visit  Medication Sig Dispense Refill  . acyclovir (ZOVIRAX) 400 MG tablet 1/2 tablet up to 5 times a day for cold sores 30 tablet 5  . atorvastatin (LIPITOR) 20 MG tablet Take 20 mg by mouth every evening.     . benzocaine (ORAJEL) 10 % mucosal gel Use as directed 1 application in the mouth or throat as needed for mouth pain.    . Cholecalciferol (VITAMIN D PO) Take 1 tablet by mouth daily.    . Cyanocobalamin (VITAMIN B 12 PO) Take 1 tablet by mouth 2 (two) times daily.    . Docosanol 10 % CREA Apply topically up to 5 times per day as needed until heals 2 g 3  . Flaxseed, Linseed, (FLAXSEED OIL) 1000 MG CAPS Take 1,000 mg by mouth 2 (two) times daily.    . fluticasone (FLONASE) 50 MCG/ACT nasal spray Place 2 sprays into both nostrils daily. 16 g 1  . gabapentin (NEURONTIN) 100 MG capsule Take 100 mg by mouth 3 (three) times daily.    . insulin detemir (LEVEMIR) 100 UNIT/ML injection Inject 0.3 mLs (30 Units total) into the skin 2 (two) times daily. 100 mL 1  . levETIRAcetam (KEPPRA) 500 MG tablet 1 in the am and 2 at bedtime 90 tablet 6  . lisinopril (PRINIVIL,ZESTRIL) 2.5 MG tablet Take 1 tablet (2.5 mg total) by mouth daily. 30 tablet 2  . loratadine (CLARITIN) 10 MG tablet Take 10 mg by mouth daily.     . Misc Natural Products (OSTEO BI-FLEX TRIPLE STRENGTH PO) Take 1 tablet by mouth 2 (two) times daily.     [name] neomycin-polymyxin-hydrocortisone (CORTISPORIN) otic solution Place 4 drops into the left ear 4 (four) times daily. 10 mL 1  .  Omega-3 Fatty Acids (FISH OIL) 1200 MG CAPS Take 1,200 mg by mouth daily.     [name] PARoxetine (PAXIL) 20 MG tablet Take 1 tablet (20 mg total) by mouth daily. 30 tablet 6  . rivaroxaban (XARELTO) 20 MG TABS tablet Take 1 tablet (20 mg total) by mouth daily with supper. 10 tablet 0  . zolpidem (AMBIEN) 10 MG tablet TAKE 1/2 TO 1 TABLET AT BEDTIME 30 tablet 5   No facility-administered medications prior to visit.     PAST MEDICAL HISTORY: Past Medical History:  Diagnosis Date  . B12 deficiency   . CVA (cerebral vascular accident) ([place])   . Diabetes mellitus without complication ([place])   . Diverticulosis   . Gout   . Gouty arthropathy   . Hemorrhoids   . History of right-sided carotid endarterectomy 04/17/12  . Hyperplastic colon polyp   . Hypertension   . Kidney stones   . MRSA infection   . Nephrolithiasis   . Neuropathy   . OSA (obstructive sleep apnea)   . Personal history of urinary calculi 05/25/2015    PAST SURGICAL HISTORY: Past Surgical History:  Procedure Laterality Date  .  CAROTID ARTERY ANGIOPLASTY Right 04/17/2012   Dr. Delana Meyer, [facility]  . CAROTID ENDARTERECTOMY Right 04/17/2012   Dr. Delana Meyer, [facility]  . CHOLECYSTECTOMY  2005   lap  . EP IMPLANTABLE DEVICE N/A 02/23/2016   Procedure: Loop Recorder Insertion;  Surgeon: Thompson Grayer, MD;  Location: [name];  Service: Cardiovascular;  Laterality: N/A;  . HEMORRHOID SURGERY  2009  . TEE WITHOUT CARDIOVERSION N/A 02/23/2016   Procedure: TRANSESOPHAGEAL ECHOCARDIOGRAM (TEE);  Surgeon: Pixie Casino, MD;  Location: [facility];  Service: Cardiovascular;  Laterality: N/A;  ALSO GETTING A LOOP    FAMILY HISTORY: Family History  Problem Relation Age of Onset  . Hodgkin's lymphoma Father   . Diabetes Mother     SOCIAL HISTORY: Social History   Social History  . Marital status: Widowed    Spouse name: N/A  . Number of children: 3  . Years of education: HS Grad   Occupational History  . Retired    Social  History Main Topics  . Smoking status: Former Smoker    Types: Cigars    Quit date: 04/08/2012  . Smokeless tobacco: Never Used     Comment: cigarettes and cigars  . Alcohol use No  . Drug use: No  . Sexual activity: Not on file   Other Topics Concern  . Not on file   Social History Narrative   Lives at home with his granddaughter.   Right-handed.   No caffeine use.         PHYSICAL EXAM  Vitals:   09/01/17 0740  BP: 121/71  Pulse: 75  Weight: 256 lb (116.1 kg)  Height: 5\' 8"  (1.727 m)   Body mass index is 38.92 kg/m.  Generalized: Well developed, obese male  no acute distress  Head: normocephalic and atraumatic,. Oropharynx benign  Neck: Supple, no carotid bruits  Cardiac: Regular rate rhythm, no murmur  Musculoskeletal: No deformity   Neurological examination   Mentation: Alert oriented to time, place, history taking. Attention span and concentration appropriate. Recent and remote memory intact.  Follows all commands speech and language fluent.   Cranial nerve II-XII: Pupils were equal round reactive to light extraocular movements were full, visual field were full on confrontational test. Facial sensation and strength were normal. hearing was intact to finger rubbing bilaterally. Uvula tongue midline. head turning and shoulder shrug were normal and symmetric.Tongue protrusion into cheek strength was normal. Motor: normal bulk and tone, full strength in the BUE, BLE, fine finger movements normal, no pronator drift. No focal weakness Sensory: normal and symmetric to light touch, pinprick, and  Vibration, in the upper and lower extremities  Coordination: finger-nose-finger, heel-to-shin bilaterally, no dysmetria Reflexes: Symmetric upper and lower plantar responses were flexor bilaterally. Gait and Station: Rising up from seated position with push off, cautious mildly unsteady gait Tandem gait is unsteady. No assistive device  DIAGNOSTIC DATA (LABS, IMAGING, TESTING) -  I reviewed patient records, labs, notes, testing and imaging myself where available.  Lab Results  Component Value Date   WBC 5.3 01/23/2017   HGB 14.1 01/23/2017   HCT 41 01/23/2017   MCV 93.0 01/23/2017   PLT 184 01/23/2017      Component Value Date/Time   NA 138 03/03/2017 1019   K 4/4 03/03/2017 1019   K 4.0 11/07/2016 0359   K 4.6 11/23/2015   K 5.1 04/18/2012 0412   CL 99 (L) 11/07/2016 0359   CL 107 04/18/2012 0412   CO2 27 11/07/2016 0334  CO2 25 04/18/2012 0412   GLUCOSE 135 (H) 11/07/2016 0359   GLUCOSE 188 (H) 04/18/2012 0412   BUN 12 01/23/2017   BUN 13 04/18/2012 0412   CREATININE 0.7 01/23/2017   CREATININE 0.70 11/07/2016 0359   CREATININE 1.07 04/18/2012 0412   CALCIUM 8.7 03/03/2017 1019   PROT 6.5 11/07/2016 0334   ALBUMIN 3.5 11/07/2016 0334   AST 14 01/23/2017   ALT 20 01/23/2017   ALKPHOS 68 11/07/2016 0334   BILITOT 0.6 11/07/2016 0334   GFRNONAA >60 11/07/2016 0334   GFRNONAA >60 04/18/2012 0412   GFRAA 118.2 03/03/2017 1019   Lab Results  Component Value Date   CHOL 113 02/21/2016   HDL 34 (L) 02/21/2016   LDLCALC 37 02/21/2016   TRIG 209 (H) 02/21/2016   CHOLHDL 3.3 02/21/2016   Lab Results  Component Value Date   HGBA1C 7.3 01/23/2017   Lab Results  Component Value Date   VITAMINB12 1,146 (H) 02/28/2016   Lab Results  Component Value Date   TSH 1.82 11/23/2015      ASSESSMENT AND PLAN  71 y.o. year old male  has a past medical history of B12 deficiency; CVA (cerebral vascular accident) ([place]); Diabetes mellitus without complication ([place]); History of right-sided carotid endarterectomy (04/17/12);  Hypertension; Kidney stones;   Neuropathy; OSA (obstructive sleep apnea); And recurrent left-sided paresthesias history of extensive white matter disease. Most consistent with partial seizure EEG showed mild background slowing.   Increase Keppra to 1000mg  am and 1000mg  pm will refill Continue Xarelto for atrial fibrillation   Continue Lipitor for hyperlipidemia Keep blood pressure less than 502 systolic, today's reading 121/71 Moderate exercise, healthy diet Hemoglobin A1c less than 6.5 most recent 9.6, continue insulin Follow-up in 4 months next with Dr. Krista Blue I spent 25 minutes in total face to face time with the patient more than 50% of which was spent counseling and coordination of care, reviewing test results, reviewing chart reviewing medications and discussing and reviewing the diagnosis of partial seizure risk factor modification for stroke /TIAand further treatment options. , Rayburn Ma, [facility], APRN  [facility] [address], [place], [place] [address] [phone]

## 2017-09-01 ENCOUNTER — Encounter: Payer: Self-pay | Admitting: Nurse Practitioner

## 2017-09-01 ENCOUNTER — Ambulatory Visit (INDEPENDENT_AMBULATORY_CARE_PROVIDER_SITE_OTHER): Payer: Medicare HMO | Admitting: Nurse Practitioner

## 2017-09-01 VITALS — BP 121/71 | HR 75 | Ht 68.0 in | Wt 256.0 lb

## 2017-09-01 DIAGNOSIS — Z794 Long term (current) use of insulin: Secondary | ICD-10-CM

## 2017-09-01 DIAGNOSIS — E1140 Type 2 diabetes mellitus with diabetic neuropathy, unspecified: Secondary | ICD-10-CM | POA: Diagnosis not present

## 2017-09-01 DIAGNOSIS — Z8673 Personal history of transient ischemic attack (TIA), and cerebral infarction without residual deficits: Secondary | ICD-10-CM

## 2017-09-01 DIAGNOSIS — E785 Hyperlipidemia, unspecified: Secondary | ICD-10-CM | POA: Diagnosis not present

## 2017-09-01 DIAGNOSIS — I480 Paroxysmal atrial fibrillation: Secondary | ICD-10-CM

## 2017-09-01 DIAGNOSIS — R569 Unspecified convulsions: Secondary | ICD-10-CM

## 2017-09-01 MED ORDER — LEVETIRACETAM 500 MG PO TABS: 1000.0000 mg | ORAL_TABLET | Freq: Two times a day (BID) | ORAL | 6 refills | Status: DC

## 2017-09-01 NOTE — Patient Instructions (Signed)
Increase Keppra to 1000mg  am and 1000mg  pm will refill Continue Xarelto for atrial fibrillation  Continue Lipitor for hyperlipidemia Keep blood pressure less than 813 systolic, today's reading 121/71 Moderate exercise, healthy diet Hemoglobin A1c less than 6.5 most recent 9.6, continue insulin Follow-up in 4 months next with Dr. Krista Blue

## 2017-09-01 NOTE — Progress Notes (Signed)
I have reviewed and agreed above plan. 

## 2017-09-01 NOTE — Progress Notes (Signed)
Keppra refill Rx faxed to [facility] pharmacy.

## 2017-09-15 ENCOUNTER — Emergency Department (HOSPITAL_COMMUNITY)
Admission: EM | Admit: 2017-09-15 | Discharge: 2017-09-15 | Disposition: A | Discharge status: Home / Self Care | Payer: Medicare HMO | Attending: Emergency Medicine | Admitting: Emergency Medicine

## 2017-09-15 ENCOUNTER — Encounter (HOSPITAL_COMMUNITY): Payer: Self-pay

## 2017-09-15 ENCOUNTER — Encounter (HOSPITAL_COMMUNITY): Payer: Self-pay | Admitting: Emergency Medicine

## 2017-09-15 ENCOUNTER — Emergency Department (HOSPITAL_COMMUNITY): Payer: Medicare HMO

## 2017-09-15 ENCOUNTER — Emergency Department (HOSPITAL_COMMUNITY)
Admission: EM | Admit: 2017-09-15 | Discharge: 2017-09-15 | Disposition: A | Discharge status: Home / Self Care | Payer: Medicare HMO | Source: Home / Self Care | Attending: Emergency Medicine | Admitting: Emergency Medicine

## 2017-09-15 DIAGNOSIS — S61412A Laceration without foreign body of left hand, initial encounter: Secondary | ICD-10-CM | POA: Diagnosis not present

## 2017-09-15 DIAGNOSIS — Y9389 Activity, other specified: Secondary | ICD-10-CM | POA: Insufficient documentation

## 2017-09-15 DIAGNOSIS — M79601 Pain in right arm: Secondary | ICD-10-CM | POA: Diagnosis not present

## 2017-09-15 DIAGNOSIS — Z79899 Other long term (current) drug therapy: Secondary | ICD-10-CM | POA: Insufficient documentation

## 2017-09-15 DIAGNOSIS — Z87891 Personal history of nicotine dependence: Secondary | ICD-10-CM | POA: Insufficient documentation

## 2017-09-15 DIAGNOSIS — X58XXXA Exposure to other specified factors, initial encounter: Secondary | ICD-10-CM

## 2017-09-15 DIAGNOSIS — Y92009 Unspecified place in unspecified non-institutional (private) residence as the place of occurrence of the external cause: Secondary | ICD-10-CM | POA: Diagnosis not present

## 2017-09-15 DIAGNOSIS — W01118A Fall on same level from slipping, tripping and stumbling with subsequent striking against other sharp object, initial encounter: Secondary | ICD-10-CM | POA: Diagnosis not present

## 2017-09-15 DIAGNOSIS — Z4801 Encounter for change or removal of surgical wound dressing: Secondary | ICD-10-CM | POA: Diagnosis not present

## 2017-09-15 DIAGNOSIS — I10 Essential (primary) hypertension: Secondary | ICD-10-CM | POA: Insufficient documentation

## 2017-09-15 DIAGNOSIS — S65812D Laceration of other blood vessels at wrist and hand level of left arm, subsequent encounter: Secondary | ICD-10-CM

## 2017-09-15 DIAGNOSIS — Z23 Encounter for immunization: Secondary | ICD-10-CM | POA: Diagnosis not present

## 2017-09-15 DIAGNOSIS — Z9049 Acquired absence of other specified parts of digestive tract: Secondary | ICD-10-CM | POA: Insufficient documentation

## 2017-09-15 DIAGNOSIS — E119 Type 2 diabetes mellitus without complications: Secondary | ICD-10-CM

## 2017-09-15 DIAGNOSIS — T148XXA Other injury of unspecified body region, initial encounter: Secondary | ICD-10-CM | POA: Diagnosis not present

## 2017-09-15 DIAGNOSIS — W19XXXA Unspecified fall, initial encounter: Secondary | ICD-10-CM

## 2017-09-15 DIAGNOSIS — Z7901 Long term (current) use of anticoagulants: Secondary | ICD-10-CM

## 2017-09-15 DIAGNOSIS — Y998 Other external cause status: Secondary | ICD-10-CM | POA: Insufficient documentation

## 2017-09-15 DIAGNOSIS — Z794 Long term (current) use of insulin: Secondary | ICD-10-CM | POA: Insufficient documentation

## 2017-09-15 DIAGNOSIS — Z8673 Personal history of transient ischemic attack (TIA), and cerebral infarction without residual deficits: Secondary | ICD-10-CM

## 2017-09-15 DIAGNOSIS — Z5189 Encounter for other specified aftercare: Secondary | ICD-10-CM

## 2017-09-15 LAB — CBC
HEMATOCRIT: 39.9 % (ref 39.0–52.0)
Hemoglobin: 13.6 g/dL (ref 13.0–17.0)
MCH: 30.9 pg (ref 26.0–34.0)
MCHC: 34.1 g/dL (ref 30.0–36.0)
MCV: 90.7 fL (ref 78.0–100.0)
Platelets: 204 10*3/uL (ref 150–400)
RBC: 4.4 MIL/uL (ref 4.22–5.81)
RDW: 12.9 % (ref 11.5–15.5)
WBC: 10.5 10*3/uL (ref 4.0–10.5)

## 2017-09-15 LAB — BASIC METABOLIC PANEL
Anion gap: 10 (ref 5–15)
BUN: 8 mg/dL (ref 6–20)
CO2: 23 mmol/L (ref 22–32)
CREATININE: 0.7 mg/dL (ref 0.61–1.24)
Calcium: 9.2 mg/dL (ref 8.9–10.3)
Chloride: 100 mmol/L — ABNORMAL LOW (ref 101–111)
Glucose, Bld: 285 mg/dL — ABNORMAL HIGH (ref 65–99)
POTASSIUM: 4.5 mmol/L (ref 3.5–5.1)
SODIUM: 133 mmol/L — AB (ref 135–145)

## 2017-09-15 LAB — TYPE AND SCREEN
ABO/RH(D): O POS
Antibody Screen: NEGATIVE

## 2017-09-15 LAB — PROTIME-INR
INR: 1.08
Prothrombin Time: 13.9 seconds (ref 11.4–15.2)

## 2017-09-15 LAB — ABO/RH: ABO/RH(D): O POS

## 2017-09-15 LAB — CBG MONITORING, ED: GLUCOSE-CAPILLARY: 282 mg/dL — AB (ref 65–99)

## 2017-09-15 MED ORDER — FENTANYL CITRATE (PF) 100 MCG/2ML IJ SOLN
50.0000 ug | Freq: Once | INTRAMUSCULAR | Status: AC
  Administered 2017-09-15: 50 ug via INTRAVENOUS
  Filled 2017-09-15: qty 2

## 2017-09-15 MED ORDER — SODIUM CHLORIDE 0.9 % IV BOLUS (SEPSIS)
500.0000 mL | Freq: Once | INTRAVENOUS | Status: AC
  Administered 2017-09-15: 500 mL via INTRAVENOUS

## 2017-09-15 MED ORDER — ONDANSETRON HCL 4 MG/2ML IJ SOLN
4.0000 mg | Freq: Once | INTRAMUSCULAR | Status: AC
  Administered 2017-09-15: 4 mg via INTRAVENOUS
  Filled 2017-09-15: qty 2

## 2017-09-15 MED ORDER — TETANUS-DIPHTH-ACELL PERTUSSIS 5-2.5-18.5 LF-MCG/0.5 IM SUSP
0.5000 mL | Freq: Once | INTRAMUSCULAR | Status: AC
  Administered 2017-09-15: 0.5 mL via INTRAMUSCULAR
  Filled 2017-09-15: qty 0.5

## 2017-09-15 MED ORDER — SULFAMETHOXAZOLE-TRIMETHOPRIM 800-160 MG PO TABS: 1.0000 | ORAL_TABLET | Freq: Two times a day (BID) | ORAL | 0 refills | Status: AC

## 2017-09-15 MED ORDER — LIDOCAINE-EPINEPHRINE (PF) 2 %-1:200000 IJ SOLN
10.0000 mL | Freq: Once | INTRAMUSCULAR | Status: AC
  Administered 2017-09-15: 10 mL
  Filled 2017-09-15: qty 20

## 2017-09-15 NOTE — ED Triage Notes (Signed)
Per [facility], Pt had a mechanical fall at the house. Pt hit his hand on the way down and has approximately 4 inch laceration to the left hand. Hx of blood thinners. No LOC. No Head Trauma. Tourniquet was placed at 1615. Tourniquet was removed at 1700. Pt had significantly bleeding upon arrival to the ED and placed tourniquet on at 1718.   Vitals per EMS: 120/69, 85 HR, 20 RR, 94% on RA.   Hx of Stroke and Seizures with Keppra, Xarelto,   20 Gauge in Right Hand

## 2017-09-15 NOTE — ED Notes (Signed)
Pt has tourniquet at 120 with pressure dressing in place. Pt is able to tolerate and bleeding not noted through the dressing at this time.

## 2017-09-15 NOTE — ED Notes (Signed)
Resident at the bedside

## 2017-09-15 NOTE — ED Provider Notes (Signed)
[facility] DEPT Provider Note   CSN: [id number] Arrival date & time: 09/15/17  1716  History   Chief Complaint Chief Complaint  Patient presents with  . Laceration   HPI Sean Hayden is a 71 y.o. male.  The patient is a 71yo male with a past medical history significant for CVA on Xarelto, carotid endarterectomy, kidney stones, and HTN, who presents to the ED via EMS with a laceration to the left hand.  The patient fell onto a piece of tin on his well house when taking out the trash this afternoon.  This was a mechanical fall.  He denies dizziness or lightheadedness prior to the fall, as well as nausea and vomiting.  He denies head, neck, and back pain at this time.  Date of last tetanus shot unknown.   The history is provided by the patient, medical records, the EMS personnel and a relative. No language interpreter was used.    Past Medical History:  Diagnosis Date  . B12 deficiency   . CVA (cerebral vascular accident) ([place])   . Diabetes mellitus without complication ([place])   . Diverticulosis   . Gout   . Gouty arthropathy   . Hemorrhoids   . History of right-sided carotid endarterectomy 04/17/12  . Hyperplastic colon polyp   . Hypertension   . Kidney stones   . MRSA infection   . Nephrolithiasis   . Neuropathy   . OSA (obstructive sleep apnea)   . Personal history of urinary calculi 05/25/2015    Patient Active Problem List   Diagnosis Date Noted  . Partial seizure ([place]) 02/10/2017  . History of MRSA infection 01/31/2017  . Situational stress 04/16/2016  . Paroxysmal atrial fibrillation ([place]) 03/26/2016  . Vitamin D deficiency 02/28/2016  . Aortic valve stenosis, congenital   . History of CVA (cerebrovascular accident) without residual deficits 02/22/2016  . Diabetes mellitus with neuropathy ([place]) 02/21/2016  . Hyperlipidemia 02/21/2016  . Hypertension 02/21/2016  . History of right-sided carotid endarterectomy 02/21/2016  . Allergic rhinitis 02/21/2016  .  B12 deficiency 12/14/2015  . Arthritis 05/25/2015  . Diverticulosis of colon 05/25/2015  . Edema 05/25/2015  . Obstructive sleep apnea of adult 05/25/2015  . Psoriasis 05/25/2015  . RAD (reactive airway disease) 05/25/2015  . TIA (transient ischemic attack) 05/25/2015  . Carotid artery disease ([place]) 03/10/2012  . Obesity 01/18/2008  . Insomnia 12/09/2006  . Gouty arthritis 02/07/1999    Past Surgical History:  Procedure Laterality Date  . CAROTID ARTERY ANGIOPLASTY Right 04/17/2012   Dr. Delana Meyer, [facility]  . CAROTID ENDARTERECTOMY Right 04/17/2012   Dr. Delana Meyer, [facility]  . CHOLECYSTECTOMY  2005   lap  . EP IMPLANTABLE DEVICE N/A 02/23/2016   Procedure: Loop Recorder Insertion;  Surgeon: Thompson Grayer, MD;  Location: [facility];  Service: Cardiovascular;  Laterality: N/A;  . HEMORRHOID SURGERY  2009  . TEE WITHOUT CARDIOVERSION N/A 02/23/2016   Procedure: TRANSESOPHAGEAL ECHOCARDIOGRAM (TEE);  Surgeon: Pixie Casino, MD;  Location: [facility] ENDOSCOPY;  Service: Cardiovascular;  Laterality: N/A;  ALSO GETTING A LOOP       Home Medications    Prior to Admission medications   Medication Sig Start Date End Date Taking? Authorizing Provider  acyclovir (ZOVIRAX) 400 MG tablet 1/2 tablet up to 5 times a day for cold sores Patient taking differently: Take 200 mg by mouth daily as needed. 5 times a day for cold sores 08/01/17  Yes Birdie Sons, MD  atorvastatin (LIPITOR) 20 MG tablet Take  20 mg by mouth every evening.  11/01/14  Yes [provider]  benzocaine (ORAJEL) 10 % mucosal gel Use as directed 1 application in the mouth or throat as needed for mouth pain.   Yes [provider]  Cholecalciferol (VITAMIN D PO) Take 1 tablet by mouth daily.   Yes [provider]  Cyanocobalamin (VITAMIN B 12 PO) Take 1 tablet by mouth 2 (two) times daily.   Yes [provider]  Docosanol 10 % CREA Apply topically up to 5 times per day as needed until heals  12/03/16  Yes Burnette, Clearnce Sorrel, PA-C  Flaxseed, Linseed, (FLAXSEED OIL) 1000 MG CAPS Take 1,000 mg by mouth 2 (two) times daily.   Yes [provider]  fluticasone (FLONASE) 50 MCG/ACT nasal spray Place 2 sprays into both nostrils daily. 12/06/16  Yes Birdie Sons, MD  gabapentin (NEURONTIN) 100 MG capsule Take 100 mg by mouth 3 (three) times daily.   Yes [provider]  insulin detemir (LEVEMIR) 100 UNIT/ML injection Inject 0.3 mLs (30 Units total) into the skin 2 (two) times daily. Patient taking differently: Inject 62 Units into the skin 2 (two) times daily.  10/11/16  Yes [name] Lighter, MD  levETIRAcetam (KEPPRA) 500 MG tablet Take 2 tablets (1,000 mg total) by mouth 2 (two) times daily. 2 in the am and 2 at bedtime 09/01/17  Yes Dennie Bible, NP  lisinopril (PRINIVIL,ZESTRIL) 2.5 MG tablet Take 1 tablet (2.5 mg total) by mouth daily. Patient taking differently: Take 5 mg by mouth daily.  10/11/16  Yes [name] Lighter, MD  loratadine (CLARITIN) 10 MG tablet Take 10 mg by mouth daily.  03/24/12  Yes [provider]  metFORMIN (GLUCOPHAGE) 850 MG tablet Take 850 mg by mouth 3 (three) times daily.    Yes [provider]  Misc Natural Products (OSTEO BI-FLEX TRIPLE STRENGTH PO) Take 1 tablet by mouth 2 (two) times daily.    Yes [provider]  Omega-3 Fatty Acids (FISH OIL) 1200 MG CAPS Take 1,200 mg by mouth daily.    Yes [provider]  rivaroxaban (XARELTO) 20 MG TABS tablet Take 1 tablet (20 mg total) by mouth daily with supper. 10/24/16  Yes Birdie Sons, MD  traMADol (ULTRAM) 50 MG tablet Take 50-100 mg by mouth 4 (four) times daily as needed for moderate pain or severe pain. 4 to 6 hrs prn   Yes [provider]  zolpidem (AMBIEN) 10 MG tablet TAKE 1/2 TO 1 TABLET AT BEDTIME Patient taking differently: TAKE 0.5 mg TO 10 mg TABLET AT BEDTIME as needed 04/11/17  Yes Fisher, Kirstie Peri, MD    neomycin-polymyxin-hydrocortisone (CORTISPORIN) otic solution Place 4 drops into the left ear 4 (four) times daily. Patient not taking: Reported on 09/15/2017 04/10/16   Carmon Ginsberg, PA  PARoxetine (PAXIL) 20 MG tablet Take 1 tablet (20 mg total) by mouth daily. Patient not taking: Reported on 09/15/2017 05/13/16   Birdie Sons, MD  sulfamethoxazole-trimethoprim (BACTRIM DS,SEPTRA DS) 800-160 MG tablet Take 1 tablet by mouth 2 (two) times daily. 09/15/17 09/25/17  Charisse March, MD   Family History Family History  Problem Relation Age of Onset  . Hodgkin's lymphoma Father   . Diabetes Mother    Social History Social History  Substance Use Topics  . Smoking status: Former Smoker    Types: Cigars    Quit date: 04/08/2012  . Smokeless tobacco: Never Used     Comment: cigarettes and cigars  .  Alcohol use No    Allergies   Actos  [pioglitazone]  Review of Systems Review of Systems  Constitutional: Negative for chills and fever.  HENT: Negative.   Eyes: Negative for pain and visual disturbance.  Respiratory: Negative for cough and shortness of breath.   Cardiovascular: Negative for chest pain and palpitations.  Gastrointestinal: Negative for abdominal pain, diarrhea, nausea and vomiting.  Genitourinary: Negative for dysuria and hematuria.  Musculoskeletal: Negative for arthralgias and back pain.  Skin: Positive for wound. Negative for color change and rash.  Neurological: Negative for seizures and syncope.  Hematological: Bruises/bleeds easily (on Xarelto).  Psychiatric/Behavioral: Negative.   All other systems reviewed and are negative.  Physical Exam Updated Vital Signs BP 134/65   Pulse 81   Temp 98 F (36.7 C) (Oral)   Resp 15   SpO2 96%   Physical Exam  Constitutional: He is oriented to person, place, and time. He appears well-developed and well-nourished. No distress.  Overweight male  HENT:  Head: Normocephalic and atraumatic.  Eyes: Conjunctivae and EOM are  normal.  Neck: Neck supple.  No c-spine ttp  Cardiovascular: Normal rate, regular rhythm, normal heart sounds and intact distal pulses.   No murmur heard. Pulmonary/Chest: Effort normal and breath sounds normal. No stridor. No respiratory distress. He has no wheezes.  Abdominal: Soft. There is no tenderness. There is no guarding. A hernia is present.  Musculoskeletal: He exhibits tenderness. He exhibits no edema or deformity.  No t- or l-spine ttp  Neurological: He is alert and oriented to person, place, and time.  Skin: Skin is warm and dry. Capillary refill takes less than 2 seconds.  ~5cm linear laceration to left hand proximal to 5th digit with pulsation of bright red blood; normal sensation and motor function in all nerve distributions; normal ROM of wrist, elbow, and shoulder  Psychiatric: He has a normal mood and affect. His behavior is normal. Judgment and thought content normal.  Nursing note and vitals reviewed.  ED Treatments / Results  Labs (all labs ordered are listed, but only abnormal results are displayed) Labs Reviewed  BASIC METABOLIC PANEL - Abnormal; Notable for the following:       Result Value   Sodium 133 (*)    Chloride 100 (*)    Glucose, Bld 285 (*)    All other components within normal limits  CBG MONITORING, ED - Abnormal; Notable for the following:    Glucose-Capillary 282 (*)    All other components within normal limits  CBC  PROTIME-INR  TYPE AND SCREEN  ABO/RH    EKG  EKG Interpretation None      Radiology Dg Hand 2 View Left  Result Date: 09/15/2017 CLINICAL DATA:  Laceration at the ulnar aspect of the hand. EXAM: LEFT HAND - 2 VIEW COMPARISON:  None. FINDINGS: Soft tissue disruption at the ulnar aspect of the hand, corresponding to the described laceration. No radiopaque foreign body. No bony injury. No intra-articular gas. Moderate arthritic changes are present at many of the interphalangeal joints. IMPRESSION: No radiopaque foreign body.  No intra-articular gas. No bony injury. Electronically Signed   By: Andreas Newport M.D.   On: 09/15/2017 18:13   Procedures .Marland KitchenLaceration Repair Date/Time: 09/16/2017 1:29 AM Performed by: Charisse March Authorized by: Fatima Blank   Consent:    Consent obtained:  Verbal   Consent given by:  Patient Anesthesia (see MAR for exact dosages):    Anesthesia method:  Local infiltration   Local anesthetic:  Lidocaine 2% WITH epi Laceration details:    Location:  Hand   Hand location:  L palm   Length (cm):  5   Depth (mm):  3 Repair type:    Repair type:  Simple Pre-procedure details:    Preparation:  Patient was prepped and draped in usual sterile fashion Exploration:    Hemostasis achieved with:  Tourniquet, direct pressure and tied off vessels   Wound exploration: entire depth of wound probed and visualized     Contaminated: no   Treatment:    Area cleansed with:  Betadine and saline   Amount of cleaning:  Standard   Irrigation solution:  Sterile saline   Irrigation volume:  3L   Irrigation method:  Pressure wash   Visualized foreign bodies/material removed: no   Skin repair:    Repair method:  Sutures   Suture size:  4-0   Suture material:  Prolene   Suture technique:  Simple interrupted   Number of sutures:  12 Approximation:    Approximation:  Close   Vermilion border: well-aligned   Post-procedure details:    Dressing:  Antibiotic ointment   Patient tolerance of procedure:  Tolerated well, no immediate complications Comments:     Initial hemostasis attempted with prolene, however unable to control pulsatile bleeding.   (including critical care time)  Medications Ordered in ED Medications  Tdap (BOOSTRIX) injection 0.5 mL (0.5 mLs Intramuscular Given 09/15/17 1743)  lidocaine-EPINEPHrine (XYLOCAINE W/EPI) 2 %-1:200000 (PF) injection 10 mL (10 mLs Infiltration Given 09/15/17 1743)  ondansetron (ZOFRAN) injection 4 mg (4 mg Intravenous Given 09/15/17 1821)    fentaNYL (SUBLIMAZE) injection 50 mcg (50 mcg Intravenous Given 09/15/17 1822)  sodium chloride 0.9 % bolus 500 mL (0 mLs Intravenous Stopped 09/15/17 2059)   Initial Impression / Assessment and Plan / ED Course  I have reviewed the triage vital signs and the nursing notes.  Pertinent labs & imaging results that were available during my care of the patient were reviewed by me and considered in my medical decision making (see chart for details).    The patient arrived with a pressure bandage in place and a tourniquet was applied in the trauma bay.  Initial differential diagnosis included soft tissue laceration, venous injury, arterial injury, fracture, and foreign body.  Pertinent labs included CBC without leukocytosis, anemia, or an abnormal platelet count.  BMP with elevated glucose; no other acute electrolyte abnormalities, AKI, or anion gap. Normal coags. Imaging studies x-rays of the left hand with no foreign body or fracture noted.    The patient was given a Tetanus booster and a 500cc IV fluid bolus.  I discussed the patient's case with the Hand Surgeon who requested ED repair.  Pulsatile bleeding continued despite an initial attempt by the attending physician to repair the affected vessel.  A tourniquet was then reapplied.  I repaired the laceration as detailed above; direct pressure was applied following suture application and the tourniquet was removed without return of bleeding.    Based on the above findings, I suspect the laceration nicked a small arteriole in the patient's hand.  He complained of mild numbness to the left 4th/5th digits following the repair, however his motor function remained intact.  I discussed the above results with the patient who verbalized understanding.  Return precautions and follow-up plans discussed including return of bleeding to the affected extremity.  The patient was discharged in stable condition with a prescription for Bactrim.  He will follow-up for  suture  removal in 12-14 days.  Final Clinical Impressions(s) / ED Diagnoses   Final diagnoses:  Fall  Laceration of left hand without foreign body, initial encounter    New Prescriptions Discharge Medication List as of 09/15/2017  8:57 PM    START taking these medications   Details  sulfamethoxazole-trimethoprim (BACTRIM DS,SEPTRA DS) 800-160 MG tablet Take 1 tablet by mouth 2 (two) times daily., Starting Mon 09/15/2017, Until Thu 09/25/2017, Print         Charisse March, MD 09/16/17 [id number]    Fatima Blank, MD 09/17/17 [phone]

## 2017-09-15 NOTE — ED Triage Notes (Signed)
Pt just discharged, was at pharmacy and noted wound to L hand started bleeding again. Resolved at this time. Dressing intact, not saturated. States "I just saw one or tow drops."

## 2017-09-15 NOTE — ED Notes (Signed)
Xray at the bedside.

## 2017-09-15 NOTE — ED Notes (Signed)
Dressing removed from hand.  No bleeding noted at present.

## 2017-09-15 NOTE — ED Notes (Signed)
Provider at the bedside.  

## 2017-09-15 NOTE — Discharge Instructions (Addendum)
Please follow-up with your doctor or the hand surgeon as instructed during previous visit today.

## 2017-09-15 NOTE — ED Provider Notes (Signed)
[facility] DEPT Provider Note   CSN: [id number] Arrival date & time: 09/15/17  2243     History   Chief Complaint Chief Complaint  Patient presents with  . Wound Check    HPI Sean Hayden is a 71 y.o. male.  Patient presents to the ED with a chief complaint of wound check.  He states that he cut his palm earlier today.  He was seen earlier and had some arterial bleeding, which was tied off in the ED.  The laceration was closed, and the patient with discharged with outpatient follow-up.  Patient states that he had his hand hanging down and noticed a few drops of blood on the ground.  This concerned him and he came to the ED for repeat evaluation.  Of note, he is on Xarelto.   The history is provided by the patient. No language interpreter was used.    Past Medical History:  Diagnosis Date  . B12 deficiency   . CVA (cerebral vascular accident) ([place])   . Diabetes mellitus without complication ([place])   . Diverticulosis   . Gout   . Gouty arthropathy   . Hemorrhoids   . History of right-sided carotid endarterectomy 04/17/12  . Hyperplastic colon polyp   . Hypertension   . Kidney stones   . MRSA infection   . Nephrolithiasis   . Neuropathy   . OSA (obstructive sleep apnea)   . Personal history of urinary calculi 05/25/2015    Patient Active Problem List   Diagnosis Date Noted  . Partial seizure ([place]) 02/10/2017  . History of MRSA infection 01/31/2017  . Situational stress 04/16/2016  . Paroxysmal atrial fibrillation ([place]) 03/26/2016  . Vitamin D deficiency 02/28/2016  . Aortic valve stenosis, congenital   . History of CVA (cerebrovascular accident) without residual deficits 02/22/2016  . Diabetes mellitus with neuropathy ([place]) 02/21/2016  . Hyperlipidemia 02/21/2016  . Hypertension 02/21/2016  . History of right-sided carotid endarterectomy 02/21/2016  . Allergic rhinitis 02/21/2016  . B12 deficiency 12/14/2015  . Arthritis 05/25/2015  . Diverticulosis of  colon 05/25/2015  . Edema 05/25/2015  . Obstructive sleep apnea of adult 05/25/2015  . Psoriasis 05/25/2015  . RAD (reactive airway disease) 05/25/2015  . TIA (transient ischemic attack) 05/25/2015  . Carotid artery disease ([place]) 03/10/2012  . Obesity 01/18/2008  . Insomnia 12/09/2006  . Gouty arthritis 02/07/1999    Past Surgical History:  Procedure Laterality Date  . CAROTID ARTERY ANGIOPLASTY Right 04/17/2012   Dr. Delana Meyer, [facility]  . CAROTID ENDARTERECTOMY Right 04/17/2012   Dr. Delana Meyer, [facility]  . CHOLECYSTECTOMY  2005   lap  . EP IMPLANTABLE DEVICE N/A 02/23/2016   Procedure: Loop Recorder Insertion;  Surgeon: Thompson Grayer, MD;  Location: [facility];  Service: Cardiovascular;  Laterality: N/A;  . HEMORRHOID SURGERY  2009  . TEE WITHOUT CARDIOVERSION N/A 02/23/2016   Procedure: TRANSESOPHAGEAL ECHOCARDIOGRAM (TEE);  Surgeon: Pixie Casino, MD;  Location: [facility] ENDOSCOPY;  Service: Cardiovascular;  Laterality: N/A;  ALSO GETTING A LOOP       Home Medications    Prior to Admission medications   Medication Sig Start Date End Date Taking? Authorizing Provider  acyclovir (ZOVIRAX) 400 MG tablet 1/2 tablet up to 5 times a day for cold sores Patient taking differently: Take 200 mg by mouth daily as needed. 5 times a day for cold sores 08/01/17   Birdie Sons, MD  atorvastatin (LIPITOR) 20 MG tablet Take 20 mg by mouth every evening.  11/01/14   [provider]  benzocaine (ORAJEL) 10 % mucosal gel Use as directed 1 application in the mouth or throat as needed for mouth pain.    [provider]  Cholecalciferol (VITAMIN D PO) Take 1 tablet by mouth daily.    [provider]  Cyanocobalamin (VITAMIN B 12 PO) Take 1 tablet by mouth 2 (two) times daily.    [provider]  Docosanol 10 % CREA Apply topically up to 5 times per day as needed until heals 12/03/16   Mar Daring, PA-C  Flaxseed, Linseed, (FLAXSEED OIL) 1000 MG CAPS Take  1,000 mg by mouth 2 (two) times daily.    [provider]  fluticasone (FLONASE) 50 MCG/ACT nasal spray Place 2 sprays into both nostrils daily. 12/06/16   Birdie Sons, MD  gabapentin (NEURONTIN) 100 MG capsule Take 100 mg by mouth 3 (three) times daily.    [provider]  insulin detemir (LEVEMIR) 100 UNIT/ML injection Inject 0.3 mLs (30 Units total) into the skin 2 (two) times daily. Patient taking differently: Inject 62 Units into the skin 2 (two) times daily.  10/11/16   [name] Lighter, MD  levETIRAcetam (KEPPRA) 500 MG tablet Take 2 tablets (1,000 mg total) by mouth 2 (two) times daily. 2 in the am and 2 at bedtime 09/01/17   Dennie Bible, NP  lisinopril (PRINIVIL,ZESTRIL) 2.5 MG tablet Take 1 tablet (2.5 mg total) by mouth daily. Patient taking differently: Take 5 mg by mouth daily.  10/11/16   [name] Lighter, MD  loratadine (CLARITIN) 10 MG tablet Take 10 mg by mouth daily.  03/24/12   [provider]  metFORMIN (GLUCOPHAGE) 850 MG tablet Take 850 mg by mouth 3 (three) times daily.     [provider]  Misc Natural Products (OSTEO BI-FLEX TRIPLE STRENGTH PO) Take 1 tablet by mouth 2 (two) times daily.     [provider]  neomycin-polymyxin-hydrocortisone (CORTISPORIN) otic solution Place 4 drops into the left ear 4 (four) times daily. Patient not taking: Reported on 09/15/2017 04/10/16   Carmon Ginsberg, PA  Omega-3 Fatty Acids (FISH OIL) 1200 MG CAPS Take 1,200 mg by mouth daily.     [provider]  PARoxetine (PAXIL) 20 MG tablet Take 1 tablet (20 mg total) by mouth daily. Patient not taking: Reported on 09/15/2017 05/13/16   Birdie Sons, MD  rivaroxaban (XARELTO) 20 MG TABS tablet Take 1 tablet (20 mg total) by mouth daily with supper. 10/24/16   Birdie Sons, MD  sulfamethoxazole-trimethoprim (BACTRIM DS,SEPTRA DS) 800-160 MG tablet Take 1 tablet by mouth 2 (two) times daily. 09/15/17 09/25/17  Charisse March, MD    traMADol (ULTRAM) 50 MG tablet Take 50-100 mg by mouth 4 (four) times daily as needed for moderate pain or severe pain. 4 to 6 hrs prn    [provider]  zolpidem (AMBIEN) 10 MG tablet TAKE 1/2 TO 1 TABLET AT BEDTIME Patient taking differently: TAKE 0.5 mg TO 10 mg TABLET AT BEDTIME as needed 04/11/17   Birdie Sons, MD    Family History Family History  Problem Relation Age of Onset  . Hodgkin's lymphoma Father   . Diabetes Mother     Social History Social History  Substance Use Topics  . Smoking status: Former Smoker    Types: Cigars    Quit date: 04/08/2012  . Smokeless tobacco: Never Used     Comment: cigarettes and cigars  . Alcohol use No  Allergies   Actos  [pioglitazone]   Review of Systems Review of Systems  All other systems reviewed and are negative.    Physical Exam Updated Vital Signs BP 134/73 (BP Location: Left Arm)   Pulse (!) 103   Temp 98.2 F (36.8 C) (Oral)   Resp 16   SpO2 95%   Physical Exam  Constitutional: He is oriented to person, place, and time. He appears well-developed and well-nourished.  HENT:  Head: Normocephalic and atraumatic.  Eyes: Conjunctivae and EOM are normal.  Neck: Normal range of motion.  Cardiovascular: Normal rate.   Brisk capillary refill in 4th and 5th fingers  Pulmonary/Chest: Effort normal.  Abdominal: He exhibits no distension.  Musculoskeletal: Normal range of motion.  ROM and strength of left 4th and 5th fingers are normal  Neurological: He is alert and oriented to person, place, and time.  Diminished sensation in left 4th and 5th fingers  Skin: Skin is dry.  Sutures to left palm intact, no active bleeding Left 4th and 5th fingers are warm and normal color   Psychiatric: He has a normal mood and affect. His behavior is normal. Judgment and thought content normal.  Nursing note and vitals reviewed.    ED Treatments / Results  Labs (all labs ordered are listed, but only abnormal results  are displayed) Labs Reviewed - No data to display  EKG  EKG Interpretation None       Radiology Dg Hand 2 View Left  Result Date: 09/15/2017 CLINICAL DATA:  Laceration at the ulnar aspect of the hand. EXAM: LEFT HAND - 2 VIEW COMPARISON:  None. FINDINGS: Soft tissue disruption at the ulnar aspect of the hand, corresponding to the described laceration. No radiopaque foreign body. No bony injury. No intra-articular gas. Moderate arthritic changes are present at many of the interphalangeal joints. IMPRESSION: No radiopaque foreign body. No intra-articular gas. No bony injury. Electronically Signed   By: Andreas Newport M.D.   On: 09/15/2017 18:13    Procedures Procedures (including critical care time)  Medications Ordered in ED Medications - No data to display   Initial Impression / Assessment and Plan / ED Course  I have reviewed the triage vital signs and the nursing notes.  Pertinent labs & imaging results that were available during my care of the patient were reviewed by me and considered in my medical decision making (see chart for details).     Patient here for wound check of left hand laceration.  Had arterial bleeding earlier which was tied off in the ED.  He noticed a few drops of blood on the floor after discharge and was concerned that the bleeding had started again.  Repeat exam shows no active bleeding. He does have decreased sensation in the left 4th/5th fingers, but has good cap refill, fingers are warm, and has normal strength.  Discussed with Dr. Leonette Monarch, who agrees with plan for discharge and outpatient follow-up.  Will give patient a sling to keep hand elevated.  Patient understands and agrees with the plan.  Final Clinical Impressions(s) / ED Diagnoses   Final diagnoses:  Visit for wound check    New Prescriptions New Prescriptions   No medications on file     Montine Circle, Hershal Coria 09/15/17 2317    Fatima Blank, MD 09/17/17 [phone]

## 2017-09-15 NOTE — Discharge Instructions (Signed)
Please take the antibiotics as instructed.  Follow-up with your primary doctor in 12-14 days for suture removal.

## 2017-09-16 ENCOUNTER — Ambulatory Visit (INDEPENDENT_AMBULATORY_CARE_PROVIDER_SITE_OTHER): Payer: Medicare HMO | Admitting: *Deleted

## 2017-09-16 DIAGNOSIS — I639 Cerebral infarction, unspecified: Secondary | ICD-10-CM

## 2017-09-16 NOTE — Progress Notes (Signed)
Carelink Summary Report / Loop Recorder 

## 2017-09-18 ENCOUNTER — Telehealth: Payer: Self-pay | Admitting: Family Medicine

## 2017-09-18 NOTE — Telephone Encounter (Signed)
Pt was discharged on 09/15/17 from [facility] for a cut on his left hand.  I have scheduled a hospital follow up appointment/MW

## 2017-09-23 ENCOUNTER — Emergency Department
Admission: EM | Admit: 2017-09-23 | Discharge: 2017-09-23 | Disposition: A | Discharge status: Home / Self Care | Payer: Medicare HMO | Attending: Emergency Medicine | Admitting: Emergency Medicine

## 2017-09-23 ENCOUNTER — Encounter: Payer: Self-pay | Admitting: Emergency Medicine

## 2017-09-23 DIAGNOSIS — Z5189 Encounter for other specified aftercare: Secondary | ICD-10-CM

## 2017-09-23 DIAGNOSIS — I10 Essential (primary) hypertension: Secondary | ICD-10-CM | POA: Insufficient documentation

## 2017-09-23 DIAGNOSIS — Z7901 Long term (current) use of anticoagulants: Secondary | ICD-10-CM | POA: Diagnosis not present

## 2017-09-23 DIAGNOSIS — Z79899 Other long term (current) drug therapy: Secondary | ICD-10-CM | POA: Insufficient documentation

## 2017-09-23 DIAGNOSIS — Z87891 Personal history of nicotine dependence: Secondary | ICD-10-CM | POA: Insufficient documentation

## 2017-09-23 DIAGNOSIS — E1140 Type 2 diabetes mellitus with diabetic neuropathy, unspecified: Secondary | ICD-10-CM | POA: Diagnosis not present

## 2017-09-23 DIAGNOSIS — Z4801 Encounter for change or removal of surgical wound dressing: Secondary | ICD-10-CM | POA: Insufficient documentation

## 2017-09-23 DIAGNOSIS — Z794 Long term (current) use of insulin: Secondary | ICD-10-CM | POA: Diagnosis not present

## 2017-09-23 LAB — COMPREHENSIVE METABOLIC PANEL
ALT: 20 U/L (ref 17–63)
AST: 23 U/L (ref 15–41)
Albumin: 3.9 g/dL (ref 3.5–5.0)
Alkaline Phosphatase: 76 U/L (ref 38–126)
Anion gap: 9 (ref 5–15)
BUN: 11 mg/dL (ref 6–20)
CO2: 24 mmol/L (ref 22–32)
Calcium: 9.1 mg/dL (ref 8.9–10.3)
Chloride: 103 mmol/L (ref 101–111)
Creatinine, Ser: 0.81 mg/dL (ref 0.61–1.24)
GFR calc Af Amer: >60 mL/min (ref >60)
GFR calc non Af Amer: >60 mL/min (ref >60)
Glucose, Bld: 243 mg/dL — ABNORMAL HIGH (ref 65–99)
Potassium: 4.8 mmol/L (ref 3.5–5.1)
Sodium: 136 mmol/L (ref 135–145)
Total Bilirubin: 0.4 mg/dL (ref 0.3–1.2)
Total Protein: 7.5 g/dL (ref 6.5–8.1)

## 2017-09-23 LAB — CBC WITH DIFFERENTIAL/PLATELET
Basophils Absolute: 0.1 10*3/uL (ref 0–0.1)
Basophils Relative: 1 %
Eosinophils Absolute: 0.1 10*3/uL (ref 0–0.7)
Eosinophils Relative: 2 %
HCT: 35.3 % — ABNORMAL LOW (ref 40.0–52.0)
Hemoglobin: 12.3 g/dL — ABNORMAL LOW (ref 13.0–18.0)
Lymphocytes Relative: 17 %
Lymphs Abs: 1.1 10*3/uL (ref 1.0–3.6)
MCH: 31.6 pg (ref 26.0–34.0)
MCHC: 34.8 g/dL (ref 32.0–36.0)
MCV: 90.6 fL (ref 80.0–100.0)
Monocytes Absolute: 0.6 10*3/uL (ref 0.2–1.0)
Monocytes Relative: 9 %
Neutro Abs: 4.7 10*3/uL (ref 1.4–6.5)
Neutrophils Relative %: 71 %
Platelets: 242 10*3/uL (ref 150–440)
RBC: 3.89 MIL/uL — ABNORMAL LOW (ref 4.40–5.90)
RDW: 13.4 % (ref 11.5–14.5)
WBC: 6.5 10*3/uL (ref 3.8–10.6)

## 2017-09-23 LAB — CUP PACEART REMOTE DEVICE CHECK
MDC IDC PG IMPLANT DT: 20170317
MDC IDC SESS DTM: 20181016080837

## 2017-09-23 MED ORDER — CLINDAMYCIN HCL 300 MG PO CAPS: 300.0000 mg | ORAL_CAPSULE | Freq: Three times a day (TID) | ORAL | 0 refills | Status: AC

## 2017-09-23 MED ORDER — CLINDAMYCIN PHOSPHATE 600 MG/50ML IV SOLN
600.0000 mg | Freq: Once | INTRAVENOUS | Status: AC
  Administered 2017-09-23: 600 mg via INTRAVENOUS
  Filled 2017-09-23: qty 50

## 2017-09-23 NOTE — ED Triage Notes (Signed)
Patient states that he fell 8 weeks ago. Patient has sutures to left hand from fall. Patient reports that his hand started bleeding today. Patient takes anticoagulant.

## 2017-09-23 NOTE — ED Provider Notes (Signed)
[facility] Emergency Department Provider Note  ____________________________________________  Time seen: Approximately 8:32 PM  I have reviewed the triage vital signs and the nursing notes.   HISTORY  Chief Complaint Wound Check    HPI Sean Hayden is a 71 y.o. male with a history of xarelto use presents to the emergency department for a wound check of the left hand. Patient underwent laceration repair of the left hand on 09/15/2017 by Dr. Leonette Monarch. Patient states that he experienced mild bleeding at laceration site today. However, patient is also concerned about night sweats for one night last night and a self-perceived "bad" smell at laceration site after showering. Patient denies pain around laceration site. Patient is concerned about a faint red line that ascends up left arm that is not connected to laceration and appeared today. No alleviating measures have been attempted.    Past Medical History:  Diagnosis Date  . B12 deficiency   . CVA (cerebral vascular accident) ([place])   . Diabetes mellitus without complication ([place])   . Diverticulosis   . Gout   . Gouty arthropathy   . Hemorrhoids   . History of right-sided carotid endarterectomy 04/17/12  . Hyperplastic colon polyp   . Hypertension   . Kidney stones   . MRSA infection   . Nephrolithiasis   . Neuropathy   . OSA (obstructive sleep apnea)   . Personal history of urinary calculi 05/25/2015    Patient Active Problem List   Diagnosis Date Noted  . Partial seizure ([place]) 02/10/2017  . History of MRSA infection 01/31/2017  . Situational stress 04/16/2016  . Paroxysmal atrial fibrillation ([place]) 03/26/2016  . Vitamin D deficiency 02/28/2016  . Aortic valve stenosis, congenital   . History of CVA (cerebrovascular accident) without residual deficits 02/22/2016  . Diabetes mellitus with neuropathy ([place]) 02/21/2016  . Hyperlipidemia 02/21/2016  . Hypertension 02/21/2016  . History of right-sided  carotid endarterectomy 02/21/2016  . Allergic rhinitis 02/21/2016  . B12 deficiency 12/14/2015  . Arthritis 05/25/2015  . Diverticulosis of colon 05/25/2015  . Edema 05/25/2015  . Obstructive sleep apnea of adult 05/25/2015  . Psoriasis 05/25/2015  . RAD (reactive airway disease) 05/25/2015  . TIA (transient ischemic attack) 05/25/2015  . Carotid artery disease ([place]) 03/10/2012  . Obesity 01/18/2008  . Insomnia 12/09/2006  . Gouty arthritis 02/07/1999    Past Surgical History:  Procedure Laterality Date  . CAROTID ARTERY ANGIOPLASTY Right 04/17/2012   Dr. Delana Meyer, [facility]  . CAROTID ENDARTERECTOMY Right 04/17/2012   Dr. Delana Meyer, [facility]  . CHOLECYSTECTOMY  2005   lap  . EP IMPLANTABLE DEVICE N/A 02/23/2016   Procedure: Loop Recorder Insertion;  Surgeon: Thompson Grayer, MD;  Location: [facility];  Service: Cardiovascular;  Laterality: N/A;  . HEMORRHOID SURGERY  2009  . TEE WITHOUT CARDIOVERSION N/A 02/23/2016   Procedure: TRANSESOPHAGEAL ECHOCARDIOGRAM (TEE);  Surgeon: Pixie Casino, MD;  Location: [facility] ENDOSCOPY;  Service: Cardiovascular;  Laterality: N/A;  ALSO GETTING A LOOP    Prior to Admission medications   Medication Sig Start Date End Date Taking? Authorizing Provider  acyclovir (ZOVIRAX) 400 MG tablet 1/2 tablet up to 5 times a day for cold sores Patient taking differently: Take 200 mg by mouth daily as needed. 5 times a day for cold sores 08/01/17   Birdie Sons, MD  atorvastatin (LIPITOR) 20 MG tablet Take 20 mg by mouth every evening.  11/01/14   [provider]  benzocaine (ORAJEL) 10 % mucosal gel  Use as directed 1 application in the mouth or throat as needed for mouth pain.    [provider]  Cholecalciferol (VITAMIN D PO) Take 1 tablet by mouth daily.    [provider]  clindamycin (CLEOCIN) 300 MG capsule Take 1 capsule (300 mg total) by mouth 3 (three) times daily. 09/23/17 10/03/17  Lannie Fields, PA-C  Cyanocobalamin (VITAMIN  B 12 PO) Take 1 tablet by mouth 2 (two) times daily.    [provider]  Docosanol 10 % CREA Apply topically up to 5 times per day as needed until heals 12/03/16   Mar Daring, PA-C  Flaxseed, Linseed, (FLAXSEED OIL) 1000 MG CAPS Take 1,000 mg by mouth 2 (two) times daily.    [provider]  fluticasone (FLONASE) 50 MCG/ACT nasal spray Place 2 sprays into both nostrils daily. 12/06/16   Birdie Sons, MD  gabapentin (NEURONTIN) 100 MG capsule Take 100 mg by mouth 3 (three) times daily.    [provider]  insulin detemir (LEVEMIR) 100 UNIT/ML injection Inject 0.3 mLs (30 Units total) into the skin 2 (two) times daily. Patient taking differently: Inject 62 Units into the skin 2 (two) times daily.  10/11/16   [name] Lighter, MD  levETIRAcetam (KEPPRA) 500 MG tablet Take 2 tablets (1,000 mg total) by mouth 2 (two) times daily. 2 in the am and 2 at bedtime 09/01/17   Dennie Bible, NP  lisinopril (PRINIVIL,ZESTRIL) 2.5 MG tablet Take 1 tablet (2.5 mg total) by mouth daily. Patient taking differently: Take 5 mg by mouth daily.  10/11/16   [name] Lighter, MD  loratadine (CLARITIN) 10 MG tablet Take 10 mg by mouth daily.  03/24/12   [provider]  metFORMIN (GLUCOPHAGE) 850 MG tablet Take 850 mg by mouth 3 (three) times daily.     [provider]  Misc Natural Products (OSTEO BI-FLEX TRIPLE STRENGTH PO) Take 1 tablet by mouth 2 (two) times daily.     [provider]  neomycin-polymyxin-hydrocortisone (CORTISPORIN) otic solution Place 4 drops into the left ear 4 (four) times daily. Patient not taking: Reported on 09/15/2017 04/10/16   Carmon Ginsberg, PA  Omega-3 Fatty Acids (FISH OIL) 1200 MG CAPS Take 1,200 mg by mouth daily.     [provider]  PARoxetine (PAXIL) 20 MG tablet Take 1 tablet (20 mg total) by mouth daily. Patient not taking: Reported on 09/15/2017 05/13/16   Birdie Sons, MD  rivaroxaban (XARELTO) 20  MG TABS tablet Take 1 tablet (20 mg total) by mouth daily with supper. 10/24/16   Birdie Sons, MD  sulfamethoxazole-trimethoprim (BACTRIM DS,SEPTRA DS) 800-160 MG tablet Take 1 tablet by mouth 2 (two) times daily. 09/15/17 09/25/17  Charisse March, MD  traMADol (ULTRAM) 50 MG tablet Take 50-100 mg by mouth 4 (four) times daily as needed for moderate pain or severe pain. 4 to 6 hrs prn    [provider]  zolpidem (AMBIEN) 10 MG tablet TAKE 1/2 TO 1 TABLET AT BEDTIME Patient taking differently: TAKE 0.5 mg TO 10 mg TABLET AT BEDTIME as needed 04/11/17   Birdie Sons, MD    Allergies Actos  [pioglitazone]  Family History  Problem Relation Age of Onset  . Hodgkin's lymphoma Father   . Diabetes Mother     Social History Social History  Substance Use Topics  . Smoking status: Former Smoker    Types: Cigars    Quit date: 04/08/2012  . Smokeless tobacco: Never Used  Comment: cigarettes and cigars  . Alcohol use No     Review of Systems  Constitutional: No fever/chills Eyes: No visual changes. No discharge ENT: No upper respiratory complaints. Cardiovascular: no chest pain. Respiratory: no cough. No SOB. Gastrointestinal: No abdominal pain.  No nausea, no vomiting.  No diarrhea.  No constipation. Genitourinary: Negative for dysuria. No hematuria Musculoskeletal: Negative for musculoskeletal pain. Skin: Patient has left hand laceration.  Neurological: Negative for headaches, focal weakness or numbness.   ____________________________________________   PHYSICAL EXAM:  VITAL SIGNS: ED Triage Vitals  Enc Vitals Group     BP 09/23/17 1914 129/66     Pulse Rate 09/23/17 1914 89     Resp 09/23/17 1914 18     Temp 09/23/17 1914 98.6 F (37 C)     Temp Source 09/23/17 1914 Oral     SpO2 09/23/17 1914 96 %     Weight 09/23/17 1913 253 lb (114.8 kg)     Height 09/23/17 1913 5\' 8"  (1.727 m)     Head Circumference --      Peak Flow --      Pain Score --      Pain  Loc --      Pain Edu? --      Excl. in [place]? --      Constitutional: Alert and oriented. Well appearing and in no acute distress. Eyes: Conjunctivae are normal. PERRL. EOMI. Head: Atraumatic.  Cardiovascular: Normal rate, regular rhythm. Normal S1 and S2.  Good peripheral circulation. Respiratory: Normal respiratory effort without tachypnea or retractions. Lungs CTAB. Good air entry to the bases with no decreased or absent breath sounds. Musculoskeletal: Full range of motion to all extremities. No gross deformities appreciated. Neurologic:  Normal speech and language. No gross focal neurologic deficits are appreciated.  Skin: Mild bleeding visualized around left hand laceration. Mild erythema visualized at along the left margins of laceration. No palpable induration. No pain upon palpation. Potential streaking visualized along left arm. Palpable radial pulse, left.  Psychiatric: Mood and affect are normal. Speech and behavior are normal. Patient exhibits appropriate insight and judgement.  ____________________________________________   LABS (all labs ordered are listed, but only abnormal results are displayed)  Labs Reviewed  CBC WITH DIFFERENTIAL/PLATELET - Abnormal; Notable for the following:       Result Value   RBC 3.89 (*)    Hemoglobin 12.3 (*)    HCT 35.3 (*)    All other components within normal limits  COMPREHENSIVE METABOLIC PANEL - Abnormal; Notable for the following:    Glucose, Bld 243 (*)    All other components within normal limits   ____________________________________________  EKG   ____________________________________________  RADIOLOGY   No results found.  ____________________________________________    PROCEDURES  Procedure(s) performed:    Procedures    Medications  clindamycin (CLEOCIN) IVPB 600 mg (0 mg Intravenous Stopped 09/23/17 2043)     ____________________________________________   INITIAL IMPRESSION / ASSESSMENT AND PLAN /  ED COURSE  Pertinent labs & imaging results that were available during my care of the patient were reviewed by me and considered in my medical decision making (see chart for details).  Review of the [place] CSRS was performed in accordance of the [place] prior to dispensing any controlled drugs.     Assessment and Plan:  Visit for Wound Check Patient presents to the emergency department for a wound check. CBC and CMP conducted in the ED were reassuring. Patient was given IV clindamycin and discharged  with oral clindamycin. Patient was advised to continue taking his bactrim in addition to the clindamycin. Patient was advised to have wound re-checked by primary care in two days. He was advised to return to emergency department if streaking or erythema surrounding wound acutely worsens. Patient voiced understanding regarding this recommendation. All patient questions were answered.      ____________________________________________  FINAL CLINICAL IMPRESSION(S) / ED DIAGNOSES  Final diagnoses:  Visit for wound check      NEW MEDICATIONS STARTED DURING THIS VISIT:  Discharge Medication List as of 09/23/2017  8:41 PM    START taking these medications   Details  clindamycin (CLEOCIN) 300 MG capsule Take 1 capsule (300 mg total) by mouth 3 (three) times daily., Starting Tue 09/23/2017, Until Fri 10/03/2017, Print            This chart was dictated using voice recognition software/Dragon. Despite best efforts to proofread, errors can occur which can change the meaning. Any change was purely unintentional.    Karren Cobble 09/23/17 2119    Harvest Dark, MD 09/23/17 2250

## 2017-09-23 NOTE — ED Notes (Signed)
Pt reports he was seen at [facility] last Monday when he fell against a well house and cut his right hand. Pt received 14 stitches and had to have the artery cauterized. Pt to ED today after having fever last night, chills and today the wound started to bleed and more swelling noted per pt. Pt denies discharge other than blood.

## 2017-09-24 ENCOUNTER — Other Ambulatory Visit: Payer: Self-pay | Admitting: Family Medicine

## 2017-09-25 NOTE — Telephone Encounter (Signed)
Please call in zolpidem  

## 2017-09-25 NOTE — Telephone Encounter (Signed)
Rx called in to pharmacy. 

## 2017-09-29 ENCOUNTER — Encounter (HOSPITAL_COMMUNITY): Payer: Self-pay | Admitting: *Deleted

## 2017-09-29 ENCOUNTER — Emergency Department (HOSPITAL_COMMUNITY)
Admission: EM | Admit: 2017-09-29 | Discharge: 2017-09-29 | Disposition: A | Discharge status: Home / Self Care | Payer: Medicare HMO | Attending: Emergency Medicine | Admitting: Emergency Medicine

## 2017-09-29 ENCOUNTER — Encounter: Payer: Self-pay | Admitting: Family Medicine

## 2017-09-29 ENCOUNTER — Ambulatory Visit (INDEPENDENT_AMBULATORY_CARE_PROVIDER_SITE_OTHER): Payer: Medicare HMO | Admitting: General Surgery

## 2017-09-29 ENCOUNTER — Ambulatory Visit (INDEPENDENT_AMBULATORY_CARE_PROVIDER_SITE_OTHER): Payer: Medicare HMO | Admitting: Family Medicine

## 2017-09-29 ENCOUNTER — Encounter: Payer: Self-pay | Admitting: General Surgery

## 2017-09-29 VITALS — BP 128/62 | HR 82 | Temp 97.8°F | Resp 16 | Wt 258.0 lb

## 2017-09-29 VITALS — HR 82 | Resp 14 | Ht 70.0 in | Wt 258.0 lb

## 2017-09-29 DIAGNOSIS — S61412A Laceration without foreign body of left hand, initial encounter: Secondary | ICD-10-CM

## 2017-09-29 DIAGNOSIS — R059 Cough, unspecified: Secondary | ICD-10-CM

## 2017-09-29 DIAGNOSIS — Z7901 Long term (current) use of anticoagulants: Secondary | ICD-10-CM | POA: Diagnosis not present

## 2017-09-29 DIAGNOSIS — S61412D Laceration without foreign body of left hand, subsequent encounter: Secondary | ICD-10-CM | POA: Insufficient documentation

## 2017-09-29 DIAGNOSIS — Z87891 Personal history of nicotine dependence: Secondary | ICD-10-CM | POA: Insufficient documentation

## 2017-09-29 DIAGNOSIS — Z8614 Personal history of Methicillin resistant Staphylococcus aureus infection: Secondary | ICD-10-CM | POA: Diagnosis not present

## 2017-09-29 DIAGNOSIS — E1140 Type 2 diabetes mellitus with diabetic neuropathy, unspecified: Secondary | ICD-10-CM | POA: Diagnosis not present

## 2017-09-29 DIAGNOSIS — Z8673 Personal history of transient ischemic attack (TIA), and cerebral infarction without residual deficits: Secondary | ICD-10-CM | POA: Insufficient documentation

## 2017-09-29 DIAGNOSIS — Z794 Long term (current) use of insulin: Secondary | ICD-10-CM | POA: Diagnosis not present

## 2017-09-29 DIAGNOSIS — R58 Hemorrhage, not elsewhere classified: Secondary | ICD-10-CM

## 2017-09-29 DIAGNOSIS — W268XXD Contact with other sharp object(s), not elsewhere classified, subsequent encounter: Secondary | ICD-10-CM | POA: Diagnosis not present

## 2017-09-29 DIAGNOSIS — R05 Cough: Secondary | ICD-10-CM

## 2017-09-29 DIAGNOSIS — I10 Essential (primary) hypertension: Secondary | ICD-10-CM | POA: Diagnosis not present

## 2017-09-29 LAB — I-STAT CHEM 8, ED
BUN: 11 mg/dL (ref 6–20)
CHLORIDE: 99 mmol/L — AB (ref 101–111)
CREATININE: 0.7 mg/dL (ref 0.61–1.24)
Calcium, Ion: 1.18 mmol/L (ref 1.15–1.40)
Glucose, Bld: 282 mg/dL — ABNORMAL HIGH (ref 65–99)
HEMATOCRIT: 32 % — AB (ref 39.0–52.0)
Hemoglobin: 10.9 g/dL — ABNORMAL LOW (ref 13.0–17.0)
Potassium: 4.7 mmol/L (ref 3.5–5.1)
Sodium: 136 mmol/L (ref 135–145)
TCO2: 26 mmol/L (ref 22–32)

## 2017-09-29 LAB — CBG MONITORING, ED: Glucose-Capillary: 254 mg/dL — ABNORMAL HIGH (ref 65–99)

## 2017-09-29 MED ORDER — TRANEXAMIC ACID 1000 MG/10ML IV SOLN
500.0000 mg | Freq: Once | INTRAVENOUS | Status: AC
  Administered 2017-09-29: 500 mg via TOPICAL
  Filled 2017-09-29: qty 10

## 2017-09-29 NOTE — Discharge Instructions (Signed)
Please return to the ED immediately if the laceration begins to bleed again. Follow up with your primary care doctor and cardiologist to discuss the risk and benefits of holding your Xarelto.

## 2017-09-29 NOTE — ED Notes (Signed)
Pt verbalizes understanding of d/c instructions. Pt taken out to lobby in wheelchair at d/c with all belongings.   

## 2017-09-29 NOTE — ED Notes (Signed)
Pt provided with boxed lunch at this time, daughter remains bedside. Bed locked and lowered with call bell in reach

## 2017-09-29 NOTE — Progress Notes (Signed)
Patient ID: Sean Hayden, male   DOB: [birth year], 71 y.o.   MRN: [id number]  Chief Complaint  Patient presents with  . Other    HPI Sean Hayden is a 71 y.o. male here today for a evaluation bleeding cut on left hand. Two weeks ago he cut his hand and had it stitched up at [facility] in [place]. Presented to his PCP today for stitch removal during which his would started bleeding profusely. Pt was taken to this office for further evaluation of bleeding left hand wound. Pt is taking Xarelto.  HPI  Past Medical History:  Diagnosis Date  . B12 deficiency   . CVA (cerebral vascular accident) ([place])   . Diabetes mellitus without complication ([place])   . Diverticulosis   . Gout   . Gouty arthropathy   . Hemorrhoids   . History of right-sided carotid endarterectomy 04/17/12  . Hyperplastic colon polyp   . Hypertension   . Kidney stones   . MRSA infection   . Nephrolithiasis   . Neuropathy   . OSA (obstructive sleep apnea)   . Personal history of urinary calculi 05/25/2015    Past Surgical History:  Procedure Laterality Date  . CAROTID ARTERY ANGIOPLASTY Right 04/17/2012   Dr. Delana Meyer, [facility]  . CAROTID ENDARTERECTOMY Right 04/17/2012   Dr. Delana Meyer, [facility]  . CHOLECYSTECTOMY  2005   lap  . EP IMPLANTABLE DEVICE N/A 02/23/2016   Procedure: Loop Recorder Insertion;  Surgeon: Thompson Grayer, MD;  Location: [facility];  Service: Cardiovascular;  Laterality: N/A;  . HEMORRHOID SURGERY  2009  . TEE WITHOUT CARDIOVERSION N/A 02/23/2016   Procedure: TRANSESOPHAGEAL ECHOCARDIOGRAM (TEE);  Surgeon: Pixie Casino, MD;  Location: [facility] ENDOSCOPY;  Service: Cardiovascular;  Laterality: N/A;  ALSO GETTING A LOOP    Family History  Problem Relation Age of Onset  . Hodgkin's lymphoma Father   . Diabetes Mother     Social History Social History  Substance Use Topics  . Smoking status: Former Smoker    Types: Cigars    Quit date: 04/08/2012  . Smokeless tobacco: Never Used   Comment: cigarettes and cigars  . Alcohol use No    Allergies  Allergen Reactions  . Actos  [Pioglitazone]     Current Outpatient Prescriptions  Medication Sig Dispense Refill  . acyclovir (ZOVIRAX) 400 MG tablet 1/2 tablet up to 5 times a day for cold sores (Patient taking differently: Take 200 mg by mouth daily as needed. 5 times a day for cold sores) 30 tablet 5  . atorvastatin (LIPITOR) 20 MG tablet Take 20 mg by mouth every evening.     . benzocaine (ORAJEL) 10 % mucosal gel Use as directed 1 application in the mouth or throat as needed for mouth pain.    . Cholecalciferol (VITAMIN D PO) Take 1 tablet by mouth daily.    . clindamycin (CLEOCIN) 300 MG capsule Take 1 capsule (300 mg total) by mouth 3 (three) times daily. 30 capsule 0  . Cyanocobalamin (VITAMIN B 12 PO) Take 1 tablet by mouth 2 (two) times daily.    . Docosanol 10 % CREA Apply topically up to 5 times per day as needed until heals 2 g 3  . Flaxseed, Linseed, (FLAXSEED OIL) 1000 MG CAPS Take 1,000 mg by mouth 2 (two) times daily.    . fluticasone (FLONASE) 50 MCG/ACT nasal spray Place 2 sprays into both nostrils daily. 16 g 1  . gabapentin (NEURONTIN) 100 MG capsule Take  100 mg by mouth 3 (three) times daily.    . insulin detemir (LEVEMIR) 100 UNIT/ML injection Inject 0.3 mLs (30 Units total) into the skin 2 (two) times daily. (Patient taking differently: Inject 62 Units into the skin 2 (two) times daily. ) 100 mL 1  . levETIRAcetam (KEPPRA) 500 MG tablet Take 2 tablets (1,000 mg total) by mouth 2 (two) times daily. 2 in the am and 2 at bedtime 120 tablet 6  . lisinopril (PRINIVIL,ZESTRIL) 2.5 MG tablet Take 1 tablet (2.5 mg total) by mouth daily. (Patient taking differently: Take 5 mg by mouth daily. ) 30 tablet 2  . loratadine (CLARITIN) 10 MG tablet Take 10 mg by mouth daily.     . metFORMIN (GLUCOPHAGE) 850 MG tablet Take 850 mg by mouth 3 (three) times daily.     . Misc Natural Products (OSTEO BI-FLEX TRIPLE STRENGTH  PO) Take 1 tablet by mouth 2 (two) times daily.     [name] neomycin-polymyxin-hydrocortisone (CORTISPORIN) otic solution Place 4 drops into the left ear 4 (four) times daily. (Patient not taking: Reported on 09/29/2017) 10 mL 1  . Omega-3 Fatty Acids (FISH OIL) 1200 MG CAPS Take 1,200 mg by mouth daily.     [name] PARoxetine (PAXIL) 20 MG tablet Take 1 tablet (20 mg total) by mouth daily. 30 tablet 6  . rivaroxaban (XARELTO) 20 MG TABS tablet Take 1 tablet (20 mg total) by mouth daily with supper. 10 tablet 0  . zolpidem (AMBIEN) 10 MG tablet 1/2 TO 1 TABLET BY MOUTH AT BEDTIME (Patient taking differently: 5mg  to 10mg  tablet at bedtime) 30 tablet 5   No current facility-administered medications for this visit.     Review of Systems Review of Systems  Constitutional: Negative.   Respiratory: Negative.   Cardiovascular: Negative.     Pulse 82, resp. rate 14, height 5\' 10"  (1.778 m), weight 258 lb (117 kg).  Physical Exam Physical Exam  Constitutional: He is oriented to person, place, and time. He appears well-developed and well-nourished.  Pt brought down in a wheelchair from Dr. Maralyn Sago office because of significant bleeding  Eyes: Conjunctivae are normal.  Cardiovascular: Normal rate.   Neurological: He is alert and oriented to person, place, and time.  Skin: Skin is warm and dry.  The bandage was taken down in the left hand and the wound explored after betadine prep. He continues to have significant bleeding from the depth of the wound which was difficult to identify without use of proper lighting and possibly a tourniquet. Even though the wound is deep it does not appear that he has any tendon damage affecting the fingers. This is best brought under control in the OR by hand surgery. Wound was re-dressed and wrapped with Kling and coban.   Data Reviewed   Assessment    Laceration left hand with profuse bleeding with patient on Xarelto. Arrangement was made for orthopaedic/hand surgeon  evaluation and repair. Pt advised fully.     Plan    As above.      HPI, Physical Exam, Assessment and Plan have been scribed under the direction and in the presence of Mckinley Jewel, MD  Gaspar Cola, CMA I have completed the exam and reviewed the above documentation for accuracy and completeness.  I agree with the above.  Haematologist has been used and any errors in dictation or transcription are unintentional.  Skiler Tye G. Jamal Collin, M.D., F.A.C.S.  Junie Panning G 09/30/2017, 2:39 PM

## 2017-09-29 NOTE — ED Notes (Signed)
Surgicel applied to pt's hand lac per MD and wrapped in pressure bandage. MD and Resident aware of pt condition. Family bedside.

## 2017-09-29 NOTE — ED Triage Notes (Signed)
Pt reports being seen here on 10/8 for laceration to left hand. Reports the laceration involved an artery. Pt went to follow up appt today and to have sutures removed, states they removed 4 sutures and then had bleeding, states it was shooting into the air. Had pressure dressing applied pta, remains in place and not removing at triage. Pt is on xarelto.

## 2017-09-29 NOTE — ED Provider Notes (Signed)
[facility] EMERGENCY DEPARTMENT Provider Note  CSN: [id number] Arrival date & time: 09/29/17  1322  History   Chief Complaint Chief Complaint  Patient presents with  . Laceration  . Post-op Problem   HPI Sean Hayden is a 71 y.o. male.  The history is provided by the patient.  Laceration   The incident occurred more than 1 week ago. The laceration is located on the left hand. The laceration mechanism was a a metal edge. The patient is experiencing no pain. He reports no foreign bodies present. His tetanus status is UTD.  Presents with bleeding from the laceration site that was repaired approximately 2 weeks ago. The patient was at his PCP office today for suture removal when the wound began to bleed again. He was sent to the ED from his PCP's office for further evaluation.   Past Medical History:  Diagnosis Date  . B12 deficiency   . CVA (cerebral vascular accident) ([place])   . Diabetes mellitus without complication ([place])   . Diverticulosis   . Gout   . Gouty arthropathy   . Hemorrhoids   . History of right-sided carotid endarterectomy 04/17/12  . Hyperplastic colon polyp   . Hypertension   . Kidney stones   . MRSA infection   . Nephrolithiasis   . Neuropathy   . OSA (obstructive sleep apnea)   . Personal history of urinary calculi 05/25/2015   Patient Active Problem List   Diagnosis Date Noted  . Partial seizure ([place]) 02/10/2017  . History of MRSA infection 01/31/2017  . Situational stress 04/16/2016  . Paroxysmal atrial fibrillation ([place]) 03/26/2016  . Vitamin D deficiency 02/28/2016  . Aortic valve stenosis, congenital   . History of CVA (cerebrovascular accident) without residual deficits 02/22/2016  . Diabetes mellitus with neuropathy ([place]) 02/21/2016  . Hyperlipidemia 02/21/2016  . Hypertension 02/21/2016  . History of right-sided carotid endarterectomy 02/21/2016  . Allergic rhinitis 02/21/2016  . B12 deficiency 12/14/2015  . Arthritis  05/25/2015  . Diverticulosis of colon 05/25/2015  . Edema 05/25/2015  . Obstructive sleep apnea of adult 05/25/2015  . Psoriasis 05/25/2015  . RAD (reactive airway disease) 05/25/2015  . TIA (transient ischemic attack) 05/25/2015  . Carotid artery disease ([place]) 03/10/2012  . Obesity 01/18/2008  . Insomnia 12/09/2006  . Gouty arthritis 02/07/1999   Past Surgical History:  Procedure Laterality Date  . CAROTID ARTERY ANGIOPLASTY Right 04/17/2012   Dr. Delana Meyer, [facility]  . CAROTID ENDARTERECTOMY Right 04/17/2012   Dr. Delana Meyer, [facility]  . CHOLECYSTECTOMY  2005   lap  . EP IMPLANTABLE DEVICE N/A 02/23/2016   Procedure: Loop Recorder Insertion;  Surgeon: Thompson Grayer, MD;  Location: [facility];  Service: Cardiovascular;  Laterality: N/A;  . HEMORRHOID SURGERY  2009  . TEE WITHOUT CARDIOVERSION N/A 02/23/2016   Procedure: TRANSESOPHAGEAL ECHOCARDIOGRAM (TEE);  Surgeon: Pixie Casino, MD;  Location: [facility] ENDOSCOPY;  Service: Cardiovascular;  Laterality: N/A;  ALSO GETTING A LOOP    Home Medications    Prior to Admission medications   Medication Sig Start Date End Date Taking? Authorizing Provider  acyclovir (ZOVIRAX) 400 MG tablet 1/2 tablet up to 5 times a day for cold sores Patient taking differently: Take 200 mg by mouth daily as needed. 5 times a day for cold sores 08/01/17   Birdie Sons, MD  atorvastatin (LIPITOR) 20 MG tablet Take 20 mg by mouth every evening.  11/01/14   [provider]  benzocaine (ORAJEL) 10 % mucosal  gel Use as directed 1 application in the mouth or throat as needed for mouth pain.    [provider]  Cholecalciferol (VITAMIN D PO) Take 1 tablet by mouth daily.    [provider]  clindamycin (CLEOCIN) 300 MG capsule Take 1 capsule (300 mg total) by mouth 3 (three) times daily. 09/23/17 10/03/17  Lannie Fields, PA-C  Cyanocobalamin (VITAMIN B 12 PO) Take 1 tablet by mouth 2 (two) times daily.    [provider]    Docosanol 10 % CREA Apply topically up to 5 times per day as needed until heals 12/03/16   Mar Daring, PA-C  Flaxseed, Linseed, (FLAXSEED OIL) 1000 MG CAPS Take 1,000 mg by mouth 2 (two) times daily.    [provider]  fluticasone (FLONASE) 50 MCG/ACT nasal spray Place 2 sprays into both nostrils daily. 12/06/16   Birdie Sons, MD  gabapentin (NEURONTIN) 100 MG capsule Take 100 mg by mouth 3 (three) times daily.    [provider]  insulin detemir (LEVEMIR) 100 UNIT/ML injection Inject 0.3 mLs (30 Units total) into the skin 2 (two) times daily. Patient taking differently: Inject 62 Units into the skin 2 (two) times daily.  10/11/16   [name] Lighter, MD  levETIRAcetam (KEPPRA) 500 MG tablet Take 2 tablets (1,000 mg total) by mouth 2 (two) times daily. 2 in the am and 2 at bedtime 09/01/17   Dennie Bible, NP  lisinopril (PRINIVIL,ZESTRIL) 2.5 MG tablet Take 1 tablet (2.5 mg total) by mouth daily. Patient taking differently: Take 5 mg by mouth daily.  10/11/16   [name] Lighter, MD  loratadine (CLARITIN) 10 MG tablet Take 10 mg by mouth daily.  03/24/12   [provider]  metFORMIN (GLUCOPHAGE) 850 MG tablet Take 850 mg by mouth 3 (three) times daily.     [provider]  Misc Natural Products (OSTEO BI-FLEX TRIPLE STRENGTH PO) Take 1 tablet by mouth 2 (two) times daily.     [provider]  neomycin-polymyxin-hydrocortisone (CORTISPORIN) otic solution Place 4 drops into the left ear 4 (four) times daily. 04/10/16   Carmon Ginsberg, PA  Omega-3 Fatty Acids (FISH OIL) 1200 MG CAPS Take 1,200 mg by mouth daily.     [provider]  PARoxetine (PAXIL) 20 MG tablet Take 1 tablet (20 mg total) by mouth daily. 05/13/16   Birdie Sons, MD  rivaroxaban (XARELTO) 20 MG TABS tablet Take 1 tablet (20 mg total) by mouth daily with supper. 10/24/16   Birdie Sons, MD  traMADol (ULTRAM) 50 MG tablet Take 50-100 mg by mouth 4 (four)  times daily as needed for moderate pain or severe pain. 4 to 6 hrs prn    [provider]  zolpidem (AMBIEN) 10 MG tablet 1/2 TO 1 TABLET BY MOUTH AT BEDTIME 09/25/17   Birdie Sons, MD   Family History Family History  Problem Relation Age of Onset  . Hodgkin's lymphoma Father   . Diabetes Mother    Social History Social History  Substance Use Topics  . Smoking status: Former Smoker    Types: Cigars    Quit date: 04/08/2012  . Smokeless tobacco: Never Used     Comment: cigarettes and cigars  . Alcohol use No   Allergies   Actos  [pioglitazone]  Review of Systems Review of Systems  Constitutional: Negative for chills and fever.  HENT: Negative for ear pain and sore throat.   Eyes: Negative for pain and  visual disturbance.  Respiratory: Negative for cough and shortness of breath.   Cardiovascular: Negative for chest pain and palpitations.  Gastrointestinal: Negative for abdominal pain and vomiting.  Genitourinary: Negative for dysuria and hematuria.  Musculoskeletal: Negative for arthralgias and back pain.  Skin: Positive for wound. Negative for color change and rash.  Neurological: Negative for seizures and syncope.  All other systems reviewed and are negative.  Physical Exam Updated Vital Signs BP (!) 145/73 (BP Location: Right Arm)   Pulse 88   Temp 98.8 F (37.1 C) (Oral)   Resp 18   SpO2 99%   Physical Exam  Constitutional: He appears well-developed and well-nourished.  HENT:  Head: Normocephalic and atraumatic.  Eyes: Conjunctivae are normal.  Neck: Neck supple.  Cardiovascular: Normal rate and regular rhythm.   No murmur heard. Pulmonary/Chest: Effort normal and breath sounds normal. No respiratory distress.  Abdominal: Soft. There is no tenderness.  Musculoskeletal: Normal range of motion. He exhibits no edema.  Neurological: He is alert.  Skin: Skin is warm and dry.  Laceration over the left palm of the hand, actively bleeding with  approximately 9 sutures still in place.  Psychiatric: He has a normal mood and affect.  Nursing note and vitals reviewed.  ED Treatments / Results  Labs (all labs ordered are listed, but only abnormal results are displayed) Labs Reviewed  CBG MONITORING, ED - Abnormal; Notable for the following:       Result Value   Glucose-Capillary 254 (*)    All other components within normal limits  I-STAT CHEM 8, ED - Abnormal; Notable for the following:    Chloride 99 (*)    Glucose, Bld 282 (*)    Hemoglobin 10.9 (*)    HCT 32.0 (*)    All other components within normal limits   EKG  EKG Interpretation None      Radiology No results found.  Procedures Procedures (including critical care time)  Medications Ordered in ED Medications  tranexamic acid (CYKLOKAPRON) injection 500 mg (500 mg Topical Given by Other 09/29/17 2100)   Initial Impression / Assessment and Plan / ED Course  I have reviewed the triage vital signs and the nursing notes.  Pertinent labs & imaging results that were available during my care of the patient were reviewed by me and considered in my medical decision making (see chart for details).  Sean Hayden is a 71 y.o. male who is on Xarelto who presented with active bleeding from a laceration over the palm of the left hand that was repaired approximately 2 weeks ago.  The patient was seen on 2 other occasions for similar problem. Each time pressure dressing was applied which resolved the problem temporarily. Today he presented to his PCP's office for removal of the sutures when the wound began to bleed again.   On exam he still has 9 sutures in place with active bleeding present from small arteriole.  Initial efforts at stopping the bleeding include surgiseal and combat guaze which initially stopped the oozing however once dressing removed to re-evaluate the wound, it started bleeding again.  Follow attempts include use of TXA which was allowed to sit for  approximately 45 minutes, upon re-evaluation the wound did not appear to be bleeding anymore. The wound was re-dressed with TXA soaked gauze.  The patient was advised to follow up with both his PCP and cardiologist to discuss risk and benefits of possibly holding Xarelto for a day or two in order to improve  clotting. The patient and his wife are in agreement with the plan and will call his cardiologist first thing in the morning. The patient was given strict return precaution should the bleeding reoccur.   Patient discharged in stable condition.   Final Clinical Impressions(s) / ED Diagnoses   Final diagnoses:  Laceration of left hand without foreign body, subsequent encounter   New Prescriptions Discharge Medication List as of 09/29/2017 10:03 PM       Fenton Foy, MD 10/01/17 1021    Forde Dandy, MD 10/01/17 1535

## 2017-09-29 NOTE — ED Notes (Signed)
ED Provider at bedside. 

## 2017-09-29 NOTE — ED Notes (Signed)
Pt's old bandage on left hand changed; wrapped in clean gauze, pressure held, and tourniquet placed on forearm in attempt to slow bleeding. Per family, laceration is bleeding more now than the day the wound occurred. Pt calm, family bedside.

## 2017-09-29 NOTE — ED Notes (Signed)
MD bedside to evaluate pt at this time. Pt 's hand lac still bleeding but seems to have slowed; dripping from bandage.

## 2017-09-29 NOTE — Progress Notes (Signed)
Patient: Sean Hayden Male    DOB: August 03, 1946   71 y.o.   MRN: [id number] Visit Date: 09/29/2017  Today's Provider: Lelon Huh, MD   Chief Complaint  Patient presents with  . Follow-up   Subjective:    HPI  Follow up ER visit  Patient was seen at [facility] ER on 09/23/2017 for possible infection related to left palm laceration sustained 09/15/2017. Treatment for this included giving patient IV Clindamycin. Patient was discharged on oral Clindamycin in addition to Bactrim prescribed at 09-15-17 ER visit below, and was advised to follow up with PCP in 2 days to have wound rechecked.  Patient sustained a laceration on 09/15/2017 and had suture placed at [facility] ER in his left hand He sustained laceration when he fell on piece of tin. He initially had pulsatile bleeding which stopped after laceration was repaired. He had a bit of oozing off and on after sutures placed. He developed some redness in left arm prompting visit to ED on 09/23/2017 as above.   He reports good compliance with treatment. He reports this condition is Improved.Patient still has some swelling around the wound. He states he has been cleaning the wound daily with saline and apply neosporin and a dressing.   ------------------------------------------------------------------------------------  He also reports cough productive clear and yellow sputum for 4-5 days. No fevers, chills, sweats, dyspnea, nasal or sinus congestion.     Allergies  Allergen Reactions  . Actos  [Pioglitazone]      Current Outpatient Prescriptions:  .  acyclovir (ZOVIRAX) 400 MG tablet, 1/2 tablet up to 5 times a day for cold sores (Patient taking differently: Take 200 mg by mouth daily as needed. 5 times a day for cold sores), Disp: 30 tablet, Rfl: 5 .  atorvastatin (LIPITOR) 20 MG tablet, Take 20 mg by mouth every evening. , Disp: , Rfl:  .  benzocaine (ORAJEL) 10 % mucosal gel, Use as directed 1 application in the mouth or  throat as needed for mouth pain., Disp: , Rfl:  .  Cholecalciferol (VITAMIN D PO), Take 1 tablet by mouth daily., Disp: , Rfl:  .  clindamycin (CLEOCIN) 300 MG capsule, Take 1 capsule (300 mg total) by mouth 3 (three) times daily., Disp: 30 capsule, Rfl: 0 .  Cyanocobalamin (VITAMIN B 12 PO), Take 1 tablet by mouth 2 (two) times daily., Disp: , Rfl:  .  Docosanol 10 % CREA, Apply topically up to 5 times per day as needed until heals, Disp: 2 g, Rfl: 3 .  Flaxseed, Linseed, (FLAXSEED OIL) 1000 MG CAPS, Take 1,000 mg by mouth 2 (two) times daily., Disp: , Rfl:  .  fluticasone (FLONASE) 50 MCG/ACT nasal spray, Place 2 sprays into both nostrils daily., Disp: 16 g, Rfl: 1 .  gabapentin (NEURONTIN) 100 MG capsule, Take 100 mg by mouth 3 (three) times daily., Disp: , Rfl:  .  insulin detemir (LEVEMIR) 100 UNIT/ML injection, Inject 0.3 mLs (30 Units total) into the skin 2 (two) times daily. (Patient taking differently: Inject 62 Units into the skin 2 (two) times daily. ), Disp: 100 mL, Rfl: 1 .  levETIRAcetam (KEPPRA) 500 MG tablet, Take 2 tablets (1,000 mg total) by mouth 2 (two) times daily. 2 in the am and 2 at bedtime, Disp: 120 tablet, Rfl: 6 .  lisinopril (PRINIVIL,ZESTRIL) 2.5 MG tablet, Take 1 tablet (2.5 mg total) by mouth daily. (Patient taking differently: Take 5 mg by mouth daily. ), Disp: 30 tablet, Rfl:  2 .  loratadine (CLARITIN) 10 MG tablet, Take 10 mg by mouth daily. , Disp: , Rfl:  .  metFORMIN (GLUCOPHAGE) 850 MG tablet, Take 850 mg by mouth 3 (three) times daily. , Disp: , Rfl:  .  Misc Natural Products (OSTEO BI-FLEX TRIPLE STRENGTH PO), Take 1 tablet by mouth 2 (two) times daily. , Disp: , Rfl:  .  neomycin-polymyxin-hydrocortisone (CORTISPORIN) otic solution, Place 4 drops into the left ear 4 (four) times daily., Disp: 10 mL, Rfl: 1 .  Omega-3 Fatty Acids (FISH OIL) 1200 MG CAPS, Take 1,200 mg by mouth daily. , Disp: , Rfl:  .  PARoxetine (PAXIL) 20 MG tablet, Take 1 tablet (20 mg  total) by mouth daily., Disp: 30 tablet, Rfl: 6 .  rivaroxaban (XARELTO) 20 MG TABS tablet, Take 1 tablet (20 mg total) by mouth daily with supper., Disp: 10 tablet, Rfl: 0 .  traMADol (ULTRAM) 50 MG tablet, Take 50-100 mg by mouth 4 (four) times daily as needed for moderate pain or severe pain. 4 to 6 hrs prn, Disp: , Rfl:  .  zolpidem (AMBIEN) 10 MG tablet, 1/2 TO 1 TABLET BY MOUTH AT BEDTIME, Disp: 30 tablet, Rfl: 5  Review of Systems  Constitutional: Negative for appetite change, chills and fever.  Respiratory: Positive for cough (productive cough x 2 weeks). Negative for chest tightness, shortness of breath and wheezing.   Cardiovascular: Negative for chest pain and palpitations.  Gastrointestinal: Negative for abdominal pain, nausea and vomiting.    Social History  Substance Use Topics  . Smoking status: Former Smoker    Types: Cigars    Quit date: 04/08/2012  . Smokeless tobacco: Never Used     Comment: cigarettes and cigars  . Alcohol use No   Objective:   BP 128/62 (BP Location: Right Arm, Patient Position: Sitting, Cuff Size: Large)   Pulse 82   Temp 97.8 F (36.6 C) (Oral)   Resp 16   Wt 258 lb (117 kg)   SpO2 96% Comment: room air  BMI 39.23 kg/m  There were no vitals filed for this visit.   Physical Exam  General appearance: alert, well developed, well nourished, cooperative and in no distress  Respiratory: Respirations even and unlabored, normal respiratory rate. Occasional expiratory wheezing. Extremities: Wound on palm of left hand clean, dry intact with small amount of dried blood and purpura lateral aspect of laceration. No bleeding on initial exam. No drainage or surround erythema.    Removed 9 sutures without incident or bleeding, but after 10th suture was removed, there was pulsatile blood spurting 3-4 feet from lesion. Applied pressure with gauze for 10 minutes but lesion continued to spurt blood when pressure release.     Assessment & Plan:     1.  Cough Likely viral URI, is already on clindamycin. He is to call if any fevers, chills, dyspnea or if not resolved within 7 days.   2. Laceration of left palm, initial encounter   3. Bleeding  Hemostasis maintained with pressure applied to gauze dressing by hand. Requested evaluation by [facility] and Dr. Jamal Collin agreed to evaluate patient. He was sent via wheelchair to office of [place] surgery.       Lelon Huh, MD  [facility]

## 2017-09-30 ENCOUNTER — Telehealth: Payer: Self-pay | Admitting: Family Medicine

## 2017-09-30 DIAGNOSIS — S61412S Laceration without foreign body of left hand, sequela: Secondary | ICD-10-CM

## 2017-09-30 NOTE — Telephone Encounter (Signed)
Please refer [facility] for follow up of deep laceration left palm. Needs to be seen ASAP. Is 15 days out from hand being sutured and still bleeding.

## 2017-09-30 NOTE — Telephone Encounter (Signed)
Please advise 

## 2017-09-30 NOTE — ED Provider Notes (Signed)
I saw and evaluated the patient, reviewed the resident's note and I agree with the findings and plan.   EKG Interpretation None      71 year old male with history of atrial fibrillation on Xarelto who presents with bleeding from left hand laceration. Sustained left hand laceration for which he was seen at [facility] and had repaired on 09/15/2017. Has been seen in ED since for persistent bleeding. He had sutures removed today, and noted to recurrent bleeding.   With bleeding from small arteriole. Attempted surgicel and combat gauze with pressure, without success. Subsequently tried topical tranexamic acid with resolution of bleeding. Dressing applied. To let remainder of wound heal secondarily. He will follow-up with PCP and hand surgery. He will discuss holding Xarelto temporarily with cardiology tomorrow over phone. Strict return and follow-up instructions reviewed. He expressed understanding of all discharge instructions and felt comfortable with the plan of care.    Forde Dandy, MD 09/30/17 [phone]

## 2017-09-30 NOTE — Telephone Encounter (Signed)
Pt stated he was in the office yesterday 09/29/17 and had to go to hospital b/c his hand wouldn't stop bleeding. Pt is requesting a referral to a hand specialist. Please advise. Thanks TNP

## 2017-10-02 ENCOUNTER — Encounter (HOSPITAL_COMMUNITY): Payer: Self-pay | Admitting: *Deleted

## 2017-10-02 DIAGNOSIS — S61412A Laceration without foreign body of left hand, initial encounter: Secondary | ICD-10-CM | POA: Diagnosis not present

## 2017-10-02 NOTE — Progress Notes (Addendum)
Mr Raulston reports that he has had a lot of bleeding from his hand, additional stitches were placed.  Patient stopped Xarelto on Sunday, 10/21- "I was afraid I was going to bleed to death."  Patient reported that he called neurologist office at [facility].  Patient said that Dr Tora Kindred instructed him to get back on it as soon as possible.  Patient reports that CBG have been running high- as high as 300.  I explained to patient the importance of checking CBG at least 4 times a day for the next 2 days and see if he can determine if anything he is eating is causing it to spike. I told he the importance of keeping CBG under 180 so that he will heal and decrease chances of infection. I instructed patient to  Take 32 units of Levimir on Friday evening and if CBG > 70 on Sat am to take 32 units of Levimir.  I instructed patient to check CBG after awaking and every 2 hours until arrival  to the hospital.  I Instructed patient if CBG is less than 70 to drink 1/2 cup of a clear juice. Recheck CBG in 15 minutes then call pre- op desk at [phone] for further instructions. If scheduled to receive Insulin, do not take Insulin.  Patient reports that he does not see a cardiologist.  Denies chest pain or lightheadedness.  Patient said he was told to not take any medication, I instructed him to take Kepra with a sip of water.

## 2017-10-02 NOTE — Progress Notes (Signed)
   10/02/17 1745  OBSTRUCTIVE SLEEP APNEA  Have you ever been diagnosed with sleep apnea through a sleep study? No  Do you snore loudly (loud enough to be heard through closed doors)?  0  Do you often feel tired, fatigued, or sleepy during the daytime (such as falling asleep during driving or talking to someone)? 0  Has anyone observed you stop breathing during your sleep? 0  Do you have, or are you being treated for high blood pressure? 1  BMI more than 35 kg/m2? 1  Age > 50 (1-yes) 1  Neck circumference greater than:Male 16 inches or larger, Male 17inches or larger? 1  Male Gender (Yes=1) 1  Obstructive Sleep Apnea Score 5  Score 5 or greater  Results sent to PCP

## 2017-10-02 NOTE — H&P (Signed)
Sean Hayden is an 71 y.o. male.   Chief Complaint: LEFT HAND LACERATION  HPI: Sean Hayden is a 71 y/o right hand dominant male who sustained a laceration to the left hand on September 15, 2017. He was seen in the emergency department where the wound was thoroughly irrigated and repaired.  He did have a significant amount of bleeding thought to be caused by a laceration of arteriole. He was seen at his primary care office this Monday for suture removal.  As the sutures were being removed he began to have significant bleeding again.[name]  He was put in a new bandage and told to follow up with our office. He was seen in our office for further evaluation.  There is no active bleeding of the wound but he has numbness and decreased range of motion of the small finger.  Discussed the reason and rationale for wound exploration and repair of any damaged structures. Discussed the surgical procedure, including risks versus benefits, and the postoperative recovery. The wound was covered with a new bandage.  He was advised to keep the bandage clean and dry until time of surgery. The patient is here today for surgery.   Past Medical History:  Diagnosis Date  . B12 deficiency   . CVA (cerebral vascular accident) ([place])   . Diabetes mellitus without complication ([place])   . Diverticulosis   . Gout   . Gouty arthropathy   . Hemorrhoids   . History of right-sided carotid endarterectomy 04/17/12  . Hyperplastic colon polyp   . Hypertension   . Kidney stones   . MRSA infection   . Nephrolithiasis   . Neuropathy   . OSA (obstructive sleep apnea)   . Personal history of urinary calculi 05/25/2015    Past Surgical History:  Procedure Laterality Date  . CAROTID ARTERY ANGIOPLASTY Right 04/17/2012   Dr. Delana Meyer, [facility]  . CAROTID ENDARTERECTOMY Right 04/17/2012   Dr. Delana Meyer, [facility]  . CHOLECYSTECTOMY  2005   lap  . EP IMPLANTABLE DEVICE N/A 02/23/2016   Procedure: Loop Recorder Insertion;  Surgeon: Thompson Grayer, MD;  Location: [place] CV LAB;  Service: Cardiovascular;  Laterality: N/A;  . HEMORRHOID SURGERY  2009  . TEE WITHOUT CARDIOVERSION N/A 02/23/2016   Procedure: TRANSESOPHAGEAL ECHOCARDIOGRAM (TEE);  Surgeon: Pixie Casino, MD;  Location: [facility] ENDOSCOPY;  Service: Cardiovascular;  Laterality: N/A;  ALSO GETTING A LOOP    Family History  Problem Relation Age of Onset  . Hodgkin's lymphoma Father   . Diabetes Mother    Social History:  reports that he quit smoking about 5 years ago. His smoking use included Cigars. He has never used smokeless tobacco. He reports that he does not drink alcohol or use drugs.  Allergies:  Allergies  Allergen Reactions  . Actos  [Pioglitazone]     No prescriptions prior to admission.    No results found for this or any previous visit (from the past 48 hour(s)). No results found.  ROS NO RECENT ILLNESSES OR HOSPITALIZATIONS  There were no vitals taken for this visit. Physical Exam  General Appearance:  Alert, cooperative, no distress, appears stated age  Head:  Normocephalic, without obvious abnormality, atraumatic  Eyes:  Pupils equal, conjunctiva/corneas clear,         Throat: Lips, mucosa, and tongue normal; teeth and gums normal  Neck: No visible masses     Lungs:   respirations unlabored  Chest Wall:  No tenderness or deformity  Heart:  Regular  rate and rhythm,  Abdomen:   Soft, non-tender,         Extremities: LUE: Tenderness of the laceration with no active bleeding. No ecchymosis, erythema, or purulent drainage. Laceration is along the palmar crease at the ulnar aspect of the hand. Moderate swelling of the hand. Capillary refill less than 2 seconds. Dullness to sensation on the distal tip of the small finger. Sensation intact to light touch in all other digits. Able to make a partial fist. Limitation with extension of the fingers.  Pulses: 2+ and symmetric  Skin: Skin color, texture, turgor normal, no rashes or lesions      Neurologic: Normal    Assessment LEFT HAND LACERATION  WITH NERVE AND ARTERY INJURY  Plan LEFT HAND WOUND EXPLORATION WITH POSSIBLE NERVE AND ARTERY REPAIR  R/B/A DISCUSSED WITH PT IN OFFICE.  PT VOICED UNDERSTANDING OF PLAN CONSENT SIGNED DAY OF SURGERY PT SEEN AND EXAMINED PRIOR TO OPERATIVE PROCEDURE/DAY OF SURGERY SITE MARKED. QUESTIONS ANSWERED WILL GO HOME FOLLOWING SURGERY  WE ARE PLANNING SURGERY FOR YOUR UPPER EXTREMITY. THE RISKS AND BENEFITS OF SURGERY INCLUDE BUT NOT LIMITED TO BLEEDING INFECTION, DAMAGE TO NEARBY NERVES ARTERIES TENDONS, FAILURE OF SURGERY TO ACCOMPLISH ITS INTENDED GOALS, PERSISTENT SYMPTOMS AND NEED FOR FURTHER SURGICAL INTERVENTION. WITH THIS IN MIND WE WILL PROCEED. I HAVE DISCUSSED WITH THE PATIENT THE PRE AND POSTOPERATIVE REGIMEN AND THE DOS AND DON'TS. PT VOICED UNDERSTANDING AND INFORMED CONSENT SIGNED.   Sean Hayden 10/02/2017, 1:36 PM

## 2017-10-03 ENCOUNTER — Encounter (HOSPITAL_COMMUNITY): Payer: Self-pay | Admitting: Emergency Medicine

## 2017-10-03 MED ORDER — DEXTROSE 5 % IV SOLN
3.0000 g | INTRAVENOUS | Status: AC
  Administered 2017-10-04: 3 g via INTRAVENOUS
  Filled 2017-10-03: qty 3000

## 2017-10-03 NOTE — Progress Notes (Signed)
Anesthesia Chart Review:  Pt is a same day work up.   Pt is a 71 year old male scheduled for L hand wound exploration with nerve and artery repair, possible tendon repair on 10/04/2017 with Iran Planas, MD  - PCP is Lelon Huh, MD - Neurology care at the [facility] includes:  Aortic stenosis (moderate by 02/23/16 TEE), PAF (identified by loop recorder after stroke 2017), HTN, DM, stroke (02/2016), loop recorder (inserted 02/23/16), carotid artery disease (s/p R CEA 04/17/12), OSA, seizures. Former smoker. BMI 37  Medications include: lipitor, levemir, keppra, lisinopril, metformin, xarelto.  Stopped xarelto 09/28/17.   Labs 09/23/17 and 09/29/17 reviewed.  - Hgb dropped from 12.3 on 09/23/17 to 10.9 on 12/30/16 due to bleeding from hand wound.  Recheck CBC day of surgery - Glucose 282 on 09/29/17. No recent HbA1c in Epic.   EKG 11/06/16: NSR. PACs.  TEE 02/23/16:  - Left ventricle: The cavity size was normal. There was mild concentric hypertrophy. Systolic function was normal. The estimated ejection fraction was in the range of 55% to 60%. Wall motion was normal; there were no regional wall motion abnormalities. - Aortic valve: Trileaflet valve. Congenital aortic stenosis with fused basal leaflets. AVA 1.32 cm2, consistent with moderate stenosis. Mild regurgitation. - Mitral valve: No evidence of vegetation. There was mild regurgitation. - Left atrium: The atrium was dilated. No evidence of thrombus in the atrial cavity or appendage. - Pulmonary veins: No anomaly. - Right atrium: No evidence of thrombus in the atrial cavity or appendage. - Atrial septum: Mobile IAS - negative for PFO by saline   microbubble study and color doppler. - Tricuspid valve: There was trivial regurgitation. - Pulmonic valve: No evidence of vegetation. - Impressions: No cardiac source of emboli was indentified. Moderate congenital aortic stenosis - AVA 1.32 cm2 by planimetry. LVEF 55-60%. Mobile IAS -  negative for PFO.  Pt was not aware of aortic stenosis diagnosis, and has not seen cardiology for it.  TEE identifying AS done as part of stroke work up.   Pt will need further assessment day of surgery by assigned anesthesiologist.   Willeen Cass, FNP-BC [facility] Short Stay Surgical [facility]/Anesthesiology Phone: [phone] 10/03/2017 3:31 PM

## 2017-10-04 ENCOUNTER — Encounter (HOSPITAL_COMMUNITY): Payer: Self-pay | Admitting: *Deleted

## 2017-10-04 ENCOUNTER — Ambulatory Visit (HOSPITAL_COMMUNITY): Payer: Medicare HMO | Admitting: Emergency Medicine

## 2017-10-04 ENCOUNTER — Ambulatory Visit (HOSPITAL_COMMUNITY)
Admission: RE | Admit: 2017-10-04 | Discharge: 2017-10-04 | Disposition: A | Discharge status: Home / Self Care | Payer: Medicare HMO | Source: Ambulatory Visit | Attending: Orthopedic Surgery | Admitting: Orthopedic Surgery

## 2017-10-04 ENCOUNTER — Encounter (HOSPITAL_COMMUNITY)
Admission: RE | Disposition: A | Discharge status: Home / Self Care | Payer: Self-pay | Source: Ambulatory Visit | Attending: Orthopedic Surgery

## 2017-10-04 DIAGNOSIS — I10 Essential (primary) hypertension: Secondary | ICD-10-CM | POA: Insufficient documentation

## 2017-10-04 DIAGNOSIS — Z79899 Other long term (current) drug therapy: Secondary | ICD-10-CM | POA: Diagnosis not present

## 2017-10-04 DIAGNOSIS — E119 Type 2 diabetes mellitus without complications: Secondary | ICD-10-CM | POA: Diagnosis not present

## 2017-10-04 DIAGNOSIS — S64497A Injury of digital nerve of left little finger, initial encounter: Secondary | ICD-10-CM | POA: Insufficient documentation

## 2017-10-04 DIAGNOSIS — Z87891 Personal history of nicotine dependence: Secondary | ICD-10-CM | POA: Insufficient documentation

## 2017-10-04 DIAGNOSIS — E1140 Type 2 diabetes mellitus with diabetic neuropathy, unspecified: Secondary | ICD-10-CM | POA: Diagnosis not present

## 2017-10-04 DIAGNOSIS — S65517A Laceration of blood vessel of left little finger, initial encounter: Secondary | ICD-10-CM | POA: Diagnosis not present

## 2017-10-04 DIAGNOSIS — I350 Nonrheumatic aortic (valve) stenosis: Secondary | ICD-10-CM | POA: Insufficient documentation

## 2017-10-04 DIAGNOSIS — M1990 Unspecified osteoarthritis, unspecified site: Secondary | ICD-10-CM

## 2017-10-04 DIAGNOSIS — S61412A Laceration without foreign body of left hand, initial encounter: Secondary | ICD-10-CM | POA: Diagnosis present

## 2017-10-04 DIAGNOSIS — Z8614 Personal history of Methicillin resistant Staphylococcus aureus infection: Secondary | ICD-10-CM | POA: Insufficient documentation

## 2017-10-04 DIAGNOSIS — I480 Paroxysmal atrial fibrillation: Secondary | ICD-10-CM | POA: Diagnosis not present

## 2017-10-04 DIAGNOSIS — T79A12A Traumatic compartment syndrome of left upper extremity, initial encounter: Secondary | ICD-10-CM | POA: Diagnosis not present

## 2017-10-04 DIAGNOSIS — S65012A Laceration of ulnar artery at wrist and hand level of left arm, initial encounter: Secondary | ICD-10-CM | POA: Diagnosis not present

## 2017-10-04 DIAGNOSIS — X58XXXA Exposure to other specified factors, initial encounter: Secondary | ICD-10-CM | POA: Insufficient documentation

## 2017-10-04 DIAGNOSIS — Z8673 Personal history of transient ischemic attack (TIA), and cerebral infarction without residual deficits: Secondary | ICD-10-CM | POA: Insufficient documentation

## 2017-10-04 DIAGNOSIS — G4733 Obstructive sleep apnea (adult) (pediatric): Secondary | ICD-10-CM | POA: Insufficient documentation

## 2017-10-04 DIAGNOSIS — S61217A Laceration without foreign body of left little finger without damage to nail, initial encounter: Secondary | ICD-10-CM | POA: Diagnosis not present

## 2017-10-04 HISTORY — DX: Nonrheumatic aortic (valve) stenosis: I35.0

## 2017-10-04 HISTORY — DX: Unspecified convulsions: R56.9

## 2017-10-04 HISTORY — DX: Dyspnea, unspecified: R06.00

## 2017-10-04 HISTORY — PX: INCISION AND DRAINAGE WOUND WITH NERVE AND ARTERY REPAIR: SHX5640

## 2017-10-04 HISTORY — DX: Malignant (primary) neoplasm, unspecified: C80.1

## 2017-10-04 HISTORY — DX: Paroxysmal atrial fibrillation: I48.0

## 2017-10-04 HISTORY — DX: Pneumonia, unspecified organism: J18.9

## 2017-10-04 HISTORY — DX: Personal history of urinary calculi: Z87.442

## 2017-10-04 LAB — CBC
HCT: 30.1 % — ABNORMAL LOW (ref 39.0–52.0)
HEMOGLOBIN: 10 g/dL — AB (ref 13.0–17.0)
MCH: 30.6 pg (ref 26.0–34.0)
MCHC: 33.2 g/dL (ref 30.0–36.0)
MCV: 92 fL (ref 78.0–100.0)
Platelets: 196 10*3/uL (ref 150–400)
RBC: 3.27 MIL/uL — ABNORMAL LOW (ref 4.22–5.81)
RDW: 13.5 % (ref 11.5–15.5)
WBC: 5.8 10*3/uL (ref 4.0–10.5)

## 2017-10-04 LAB — GLUCOSE, CAPILLARY
Glucose-Capillary: 114 mg/dL — ABNORMAL HIGH (ref 65–99)
Glucose-Capillary: 128 mg/dL — ABNORMAL HIGH (ref 65–99)

## 2017-10-04 SURGERY — INCISION AND DRAINAGE WOUND WITH NERVE AND ARTERY REPAIR
Anesthesia: Choice | Site: Hand | Laterality: Left

## 2017-10-04 MED ORDER — MIDAZOLAM HCL 2 MG/2ML IJ SOLN
INTRAMUSCULAR | Status: AC
  Filled 2017-10-04: qty 2

## 2017-10-04 MED ORDER — PROPOFOL 500 MG/50ML IV EMUL
INTRAVENOUS | Status: DC | PRN
  Administered 2017-10-04: 50 ug/kg/min via INTRAVENOUS

## 2017-10-04 MED ORDER — SODIUM BICARBONATE 4 % IV SOLN
INTRAVENOUS | Status: AC
  Filled 2017-10-04: qty 5

## 2017-10-04 MED ORDER — PHENYLEPHRINE HCL 10 MG/ML IJ SOLN
INTRAVENOUS | Status: DC | PRN
  Administered 2017-10-04: 30 ug/min via INTRAVENOUS

## 2017-10-04 MED ORDER — SODIUM CHLORIDE 0.9 % IV SOLN
INTRAVENOUS | Status: DC | PRN
  Administered 2017-10-04: 12:00:00

## 2017-10-04 MED ORDER — PHENYLEPHRINE 40 MCG/ML (10ML) SYRINGE FOR IV PUSH (FOR BLOOD PRESSURE SUPPORT)
PREFILLED_SYRINGE | INTRAVENOUS | Status: DC | PRN
  Administered 2017-10-04 (×2): 80 ug via INTRAVENOUS

## 2017-10-04 MED ORDER — LACTATED RINGERS IV SOLN
INTRAVENOUS | Status: DC
  Administered 2017-10-04: 10:00:00 via INTRAVENOUS

## 2017-10-04 MED ORDER — FENTANYL CITRATE (PF) 100 MCG/2ML IJ SOLN
INTRAMUSCULAR | Status: DC | PRN
Start: 2017-10-04 — End: 2017-10-04
  Administered 2017-10-04 (×3): 50 ug via INTRAVENOUS

## 2017-10-04 MED ORDER — FENTANYL CITRATE (PF) 250 MCG/5ML IJ SOLN
INTRAMUSCULAR | Status: AC
  Filled 2017-10-04: qty 5

## 2017-10-04 MED ORDER — PROPOFOL 10 MG/ML IV BOLUS
INTRAVENOUS | Status: AC
  Filled 2017-10-04: qty 20

## 2017-10-04 MED ORDER — CHLORHEXIDINE GLUCONATE 4 % EX LIQD: 60.0000 mL | Freq: Once | CUTANEOUS | Status: DC

## 2017-10-04 MED ORDER — ONDANSETRON HCL 4 MG/2ML IJ SOLN
INTRAMUSCULAR | Status: DC | PRN
  Administered 2017-10-04: 4 mg via INTRAVENOUS

## 2017-10-04 MED ORDER — 0.9 % SODIUM CHLORIDE (POUR BTL) OPTIME
TOPICAL | Status: DC | PRN
  Administered 2017-10-04: 1000 mL

## 2017-10-04 MED ORDER — PROPOFOL 10 MG/ML IV BOLUS
INTRAVENOUS | Status: DC | PRN
  Administered 2017-10-04: 20 mg via INTRAVENOUS

## 2017-10-04 SURGICAL SUPPLY — 59 items
BANDAGE ACE 3X5.8 VEL STRL LF (GAUZE/BANDAGES/DRESSINGS) ×3 IMPLANT
BANDAGE ACE 4X5 VEL STRL LF (GAUZE/BANDAGES/DRESSINGS) ×3 IMPLANT
BANDAGE ELASTIC 4 VELCRO ST LF (GAUZE/BANDAGES/DRESSINGS) ×3 IMPLANT
BLADE SURG 15 STRL LF DISP TIS (BLADE) IMPLANT
BLADE SURG 15 STRL SS (BLADE)
BNDG ESMARK 4X9 LF (GAUZE/BANDAGES/DRESSINGS) ×3 IMPLANT
BNDG GAUZE ELAST 4 BULKY (GAUZE/BANDAGES/DRESSINGS) ×3 IMPLANT
CORDS BIPOLAR (ELECTRODE) ×3 IMPLANT
COVER MAYO STAND STRL (DRAPES) ×3 IMPLANT
CUFF TOURNIQUET SINGLE 18IN (TOURNIQUET CUFF) IMPLANT
CUFF TOURNIQUET SINGLE 24IN (TOURNIQUET CUFF) IMPLANT
DRAPE SURG 17X23 STRL (DRAPES) ×3 IMPLANT
DRSG ADAPTIC 3X8 NADH LF (GAUZE/BANDAGES/DRESSINGS) ×3 IMPLANT
DRSG EMULSION OIL 3X3 NADH (GAUZE/BANDAGES/DRESSINGS) ×3 IMPLANT
GAUZE SPONGE 4X4 12PLY STRL (GAUZE/BANDAGES/DRESSINGS) ×3 IMPLANT
GAUZE XEROFORM 5X9 LF (GAUZE/BANDAGES/DRESSINGS) ×3 IMPLANT
GLOVE BIOGEL PI IND STRL 8.5 (GLOVE) ×1 IMPLANT
GLOVE BIOGEL PI INDICATOR 8.5 (GLOVE) ×2
GLOVE SURG ORTHO 8.0 STRL STRW (GLOVE) ×3 IMPLANT
GOWN STRL REUS W/ TWL LRG LVL3 (GOWN DISPOSABLE) ×2 IMPLANT
GOWN STRL REUS W/ TWL XL LVL3 (GOWN DISPOSABLE) ×1 IMPLANT
GOWN STRL REUS W/TWL LRG LVL3 (GOWN DISPOSABLE) ×4
GOWN STRL REUS W/TWL XL LVL3 (GOWN DISPOSABLE) ×2
KIT BASIN OR (CUSTOM PROCEDURE TRAY) ×3 IMPLANT
LOOP VESSEL MAXI BLUE (MISCELLANEOUS) ×3 IMPLANT
LOOP VESSEL MINI RED (MISCELLANEOUS) ×3 IMPLANT
NEEDLE HYPO 25X1 1.5 SAFETY (NEEDLE) IMPLANT
NS IRRIG 1000ML POUR BTL (IV SOLUTION) ×3 IMPLANT
PACK ORTHO EXTREMITY (CUSTOM PROCEDURE TRAY) ×3 IMPLANT
PAD CAST 4YDX4 CTTN HI CHSV (CAST SUPPLIES) ×1 IMPLANT
PADDING CAST ABS 4INX4YD NS (CAST SUPPLIES) ×2
PADDING CAST ABS COTTON 4X4 ST (CAST SUPPLIES) ×1 IMPLANT
PADDING CAST COTTON 4X4 STRL (CAST SUPPLIES) ×2
SET CYSTO W/LG BORE CLAMP LF (SET/KITS/TRAYS/PACK) IMPLANT
SOAP 2 % CHG 4 OZ (WOUND CARE) ×3 IMPLANT
SPEAR EYE SURG WECK-CEL (MISCELLANEOUS) ×3 IMPLANT
SUCTION FRAZIER HANDLE 10FR (MISCELLANEOUS)
SUCTION TUBE FRAZIER 10FR DISP (MISCELLANEOUS) IMPLANT
SUT ETHIBOND 3-0 V-5 (SUTURE) IMPLANT
SUT ETHILON 4 0 PS 2 18 (SUTURE) IMPLANT
SUT ETHILON 8 0 BV130 4 (SUTURE) IMPLANT
SUT ETHILON 9 0 BV130 4 (SUTURE) IMPLANT
SUT FIBER WIRE 4.0 (SUTURE) IMPLANT
SUT FIBERWIRE 2-0 18 17.9 3/8 (SUTURE)
SUT FIBERWIRE 3-0 18 TAPR NDL (SUTURE)
SUT MERSILENE 4 0 P 3 (SUTURE) IMPLANT
SUT PROLENE 4 0 PS 2 18 (SUTURE) IMPLANT
SUT PROLENE 5 0 PS 2 (SUTURE) IMPLANT
SUT PROLENE 6 0 P 1 18 (SUTURE) IMPLANT
SUT VIC AB 2-0 SH 27 (SUTURE)
SUT VIC AB 2-0 SH 27XBRD (SUTURE) IMPLANT
SUT VICRYL 4-0 PS2 18IN ABS (SUTURE) ×3 IMPLANT
SUTURE FIBERWR 2-0 18 17.9 3/8 (SUTURE) IMPLANT
SUTURE FIBERWR 3-0 18 TAPR NDL (SUTURE) IMPLANT
SYR CONTROL 10ML LL (SYRINGE) IMPLANT
TUBE CONNECTING 12'X1/4 (SUCTIONS) ×1
TUBE CONNECTING 12X1/4 (SUCTIONS) ×2 IMPLANT
UNDERPAD 30X30 (UNDERPADS AND DIAPERS) ×3 IMPLANT
WATER STERILE IRR 1000ML POUR (IV SOLUTION) ×3 IMPLANT

## 2017-10-04 NOTE — Discharge Instructions (Signed)
KEEP BANDAGE CLEAN AND DRY CALL OFFICE FOR F/U APPT [phone] N 10 DAYS KEEP HAND ELEVATED ABOVE HEART OK TO APPLY ICE TO OPERATIVE AREA CONTACT OFFICE IF ANY WORSENING PAIN OR CONCERNS.

## 2017-10-04 NOTE — Op Note (Signed)
PREOPERATIVE DIAGNOSIS: Left hand laceration with nerve involvement  POSTOPERATIVE DIAGNOSIS: Left hand laceration with nerve and artery involvement  ATTENDING SURGEON: Dr. Gavin Pound who was scrubbed and present for the entire procedure  ASSISTANT SURGEON: None  ANESTHESIA: Block with IV sedation  OPERATIVE PROCEDURE: #1: Left hand ulnar digital nerve repair in the hand, small finger #2: Left hand ulnar digital artery repair in the hand, small finger #3: Microscope use #4: Decompressive fasciotomy and evacuation of hematoma left hand #5: Complex wound repair 3 and half centimeters left hand  IMPLANTS: None  RADIOGRAPHIC INTERPRETATION: None  SURGICAL INDICATIONS: Patient is a right-hand-dominant gentleman who sustained a laceration to the left hand. Patient was seen and evaluated in the office and recommended undergo the above procedure. Risks benefits and alternatives were discussed in detail the patient in a signed informed consent was obtained. Risks include but not limited to bleeding infection damage to nearby nerves arteries or tendons loss of motion of the wrists and digits incomplete relief of symptoms and need for further surgical intervention.  SURGICAL TECHNIQUE: Patient is probably identified in the preoperative holding area and a mark with a permanent marker made on the left hand indicate correct operative site. The patient was then brought back to the operating room and placed supine on anesthesia and table where IV sedation was administered. A well-padded tourniquet placed on the left brachium and sealed with the appropriate drape. A timeout was called the correct site was identified and the procedure was then begun. The traumatic laceration was then extended proximally and distally this was a 3-1/2 cm laceration. The tourniquet had been insufflated. Dissection carried down through the skin is subtendinous tissue where the large hematoma was evacuated along the course of the  hyperthenar musculature decompressive fasciotomy was then done of the hyperthenar musculature. Once the evacuation and decompression was completed attention was then turned to identification of the ulnar digital nerve and artery. These were the proper digital arteries to the small finger. Once these were properly identified attention was then turned to repair. The microscope was then brought in. Under the microscope using the epineurial technique of nerve repair the ulnar digital nerve to the small finger was then repaired. This was done with 8-0 nylon suture. After repair the nerve attention was then turned to the artery. The artery was then repaired tourniquet had been deflated. There was flow through the artery proximally and distally. Vessel clamps then applied and the artery. Under the microscope using 9-0 nylon suture the artery was repaired. The wound was then thoroughly irrigated. After thorough wound irrigation the pneumatic laceration was then closed with simple Prolene sutures. Adaptic dressing a sterile compressive bandage then applied. The patient tolerated the procedure well. The patient was placed in well-padded ulnar gutter splint  taken recovery room in good condition.  POSTOPERATIVE PLAN: Patient be discharged to home. Seen back now office in 10 days for wound check do not plan on suture removal at that visit we'll keep the splint in place him back in the splint likely sutures out around the 14-21 and a mark. No x-rays of the next visit.

## 2017-10-04 NOTE — Transfer of Care (Signed)
Immediate Anesthesia Transfer of Care Note  Patient: Sean Hayden  Procedure(s) Performed: LEFT HAND WOUND EXPLORATION repair as necessary (Left Hand)  Patient Location: PACU  Anesthesia Type:MAC combined with regional for post-op pain  Level of Consciousness: awake, alert  and oriented  Airway & Oxygen Therapy: Patient Spontanous Breathing  Post-op Assessment: Report given to RN and Post -op Vital signs reviewed and stable  Post vital signs: Reviewed and stable  Last Vitals:  Vitals:   10/04/17 1114 10/04/17 1307  BP:  91/60  Pulse: 78   Resp: 17 10  Temp:  (!) 36.3 C  SpO2: 97% 98%    Last Pain:  Vitals:   10/04/17 1307  TempSrc:   PainSc: (P) 0-No pain      Patients Stated Pain Goal: 4 (10/08/58 4585)  Complications: No apparent anesthesia complications

## 2017-10-04 NOTE — Progress Notes (Signed)
Late entry from Preop. Recommended to patient that he check with physician about need to continue fish oil, flaxseed oil, and osteobiflex while taking xarelto as these medications can also thin blood. Patient verbalized understanding.

## 2017-10-04 NOTE — Anesthesia Procedure Notes (Signed)
Anesthesia Regional Block: Axillary brachial plexus block   Pre-Anesthetic Checklist: ,, timeout performed, Correct Patient, Correct Site, Correct Laterality, Correct Procedure, Correct Position, site marked, Risks and benefits discussed,  Surgical consent,  Pre-op evaluation,  At surgeon's request and post-op pain management  Laterality: Left  Prep: chloraprep       Needles:  Injection technique: Single-shot  Needle Type: Echogenic Stimulator Needle     Needle Length: 9cm  Needle Gauge: 22     Additional Needles:   Narrative:  Start time: 10/04/2017 11:10 AM End time: 10/04/2017 11:15 AM Injection made incrementally with aspirations every 5 mL.  Performed by: Personally  Anesthesiologist: Mikhai Bienvenue  Additional Notes: 50 cc 1.5% lidocaine with 1:200 epi and 3 cc 4.2% NaCO3 injected easily

## 2017-10-04 NOTE — Anesthesia Preprocedure Evaluation (Signed)
Anesthesia Evaluation  Patient identified by MRN, date of birth, ID band Patient awake    Reviewed: Allergy & Precautions, NPO status , Patient's Chart, lab work & pertinent test results  Airway Mallampati: II  TM Distance: >3 FB Neck ROM: Full    Dental  (+) Teeth Intact, Dental Advisory Given, Partial Upper   Pulmonary former smoker,    breath sounds clear to auscultation       Cardiovascular hypertension,  Rhythm:Regular     Neuro/Psych    GI/Hepatic   Endo/Other  diabetes  Renal/GU      Musculoskeletal   Abdominal   Peds  Hematology   Anesthesia Other Findings   Reproductive/Obstetrics                             Anesthesia Physical Anesthesia Plan  ASA: III  Anesthesia Plan: Regional and MAC   Post-op Pain Management:    Induction:   PONV Risk Score and Plan: Ondansetron and Dexamethasone  Airway Management Planned: Natural Airway and Simple Face Mask  Additional Equipment:   Intra-op Plan:   Post-operative Plan:   Informed Consent: I have reviewed the patients History and Physical, chart, labs and discussed the procedure including the risks, benefits and alternatives for the proposed anesthesia with the patient or authorized representative who has indicated his/her understanding and acceptance.   Dental advisory given  Plan Discussed with: CRNA and Anesthesiologist  Anesthesia Plan Comments:         Anesthesia Quick Evaluation

## 2017-10-04 NOTE — Anesthesia Postprocedure Evaluation (Signed)
Anesthesia Post Note  Patient: Sean Hayden  Procedure(s) Performed: LEFT HAND WOUND EXPLORATION repair as necessary (Left Hand)     Patient location during evaluation: PACU Anesthesia Type: Regional Level of consciousness: awake, awake and alert and oriented Pain management: pain level controlled Vital Signs Assessment: post-procedure vital signs reviewed and stable Respiratory status: spontaneous breathing, nonlabored ventilation and respiratory function stable Cardiovascular status: blood pressure returned to baseline Postop Assessment: no headache Anesthetic complications: no    Last Vitals:  Vitals:   10/04/17 1320 10/04/17 1327  BP: (!) 106/56 (!) 105/55  Pulse: 66 66  Resp: 13 15  Temp:  (!) 36.2 C  SpO2: 96% 100%    Last Pain:  Vitals:   10/04/17 1327  TempSrc:   PainSc: 0-No pain                 [name] COKER

## 2017-10-05 ENCOUNTER — Encounter (HOSPITAL_COMMUNITY): Payer: Self-pay | Admitting: Orthopedic Surgery

## 2017-10-16 ENCOUNTER — Ambulatory Visit (INDEPENDENT_AMBULATORY_CARE_PROVIDER_SITE_OTHER): Payer: Medicare HMO | Admitting: *Deleted

## 2017-10-16 DIAGNOSIS — I639 Cerebral infarction, unspecified: Secondary | ICD-10-CM | POA: Diagnosis not present

## 2017-10-17 NOTE — Progress Notes (Signed)
Carelink Summary Report / Loop Recorder 

## 2017-10-23 DIAGNOSIS — M79642 Pain in left hand: Secondary | ICD-10-CM | POA: Diagnosis not present

## 2017-10-23 LAB — CUP PACEART REMOTE DEVICE CHECK
Implantable Pulse Generator Implant Date: 20170317
MDC IDC SESS DTM: 20181115090901

## 2017-11-04 DIAGNOSIS — M79642 Pain in left hand: Secondary | ICD-10-CM | POA: Diagnosis not present

## 2017-11-06 LAB — HEMOGLOBIN A1C: Hemoglobin A1C: 7.2

## 2017-11-10 ENCOUNTER — Ambulatory Visit: Payer: Medicare HMO | Admitting: Family Medicine

## 2017-11-10 ENCOUNTER — Encounter: Payer: Self-pay | Admitting: Family Medicine

## 2017-11-10 VITALS — BP 124/62 | HR 68 | Temp 97.7°F | Resp 16 | Wt 255.0 lb

## 2017-11-10 DIAGNOSIS — R053 Chronic cough: Secondary | ICD-10-CM

## 2017-11-10 DIAGNOSIS — R05 Cough: Secondary | ICD-10-CM | POA: Diagnosis not present

## 2017-11-10 MED ORDER — MONTELUKAST SODIUM 10 MG PO TABS: 10.0000 mg | ORAL_TABLET | Freq: Every day | ORAL | 2 refills | Status: DC

## 2017-11-10 NOTE — Progress Notes (Signed)
Patient: Sean Hayden Male    DOB: 05-30-1946   71 y.o.   MRN: [id number] Visit Date: 11/10/2017  Today's Provider: Lelon Huh, MD   Chief Complaint  Patient presents with  . Cough   Subjective:    Cough  This is a new problem. The current episode started more than 1 month ago. The problem has been unchanged. The problem occurs constantly. The cough is non-productive. Associated symptoms include postnasal drip and shortness of breath. Pertinent negatives include no ear congestion or fever.   Patient comes in today to be evaluated for a cough. He reports that he has had his cough for about 1 month, and it has not gotten any better. He has not been taking anything OTC for his symptoms. Cough has been waxing and waning. Cough is non-productive. No nasal discharge. Has tried Claritin which hasn't helped much.     Allergies  Allergen Reactions  . Actos [Pioglitazone]     UNSPECIFIED REACTION      Current Outpatient Medications:  .  acyclovir (ZOVIRAX) 400 MG tablet, 1/2 tablet up to 5 times a day for cold sores (Patient taking differently: Take 200 mg by mouth daily as needed. 5 times a day for cold sores), Disp: 30 tablet, Rfl: 5 .  atorvastatin (LIPITOR) 20 MG tablet, Take 20 mg by mouth every evening. , Disp: , Rfl:  .  benzocaine (ORAJEL) 10 % mucosal gel, Use as directed 1 application in the mouth or throat as needed for mouth pain., Disp: , Rfl:  .  Cholecalciferol (VITAMIN D PO), Take 1 tablet by mouth daily., Disp: , Rfl:  .  Docosanol 10 % CREA, Apply topically up to 5 times per day as needed until heals, Disp: 2 g, Rfl: 3 .  Flaxseed, Linseed, (FLAXSEED OIL) 1000 MG CAPS, Take 1,000 mg by mouth 2 (two) times daily., Disp: , Rfl:  .  fluticasone (FLONASE) 50 MCG/ACT nasal spray, Place 2 sprays into both nostrils daily., Disp: 16 g, Rfl: 1 .  gabapentin (NEURONTIN) 100 MG capsule, Take 100 mg by mouth 3 (three) times daily., Disp: , Rfl:  .  insulin detemir  (LEVEMIR) 100 UNIT/ML injection, Inject 0.3 mLs (30 Units total) into the skin 2 (two) times daily. (Patient taking differently: Inject 62 Units into the skin 2 (two) times daily. Taking 65 units hs and 65 units am), Disp: 100 mL, Rfl: 1 .  levETIRAcetam (KEPPRA) 500 MG tablet, Take 2 tablets (1,000 mg total) by mouth 2 (two) times daily. 2 in the am and 2 at bedtime, Disp: 120 tablet, Rfl: 6 .  lisinopril (PRINIVIL,ZESTRIL) 2.5 MG tablet, Take 1 tablet (2.5 mg total) by mouth daily. (Patient taking differently: Take 5 mg by mouth daily. ), Disp: 30 tablet, Rfl: 2 .  loratadine (CLARITIN) 10 MG tablet, Take 10 mg by mouth daily. , Disp: , Rfl:  .  metFORMIN (GLUCOPHAGE) 850 MG tablet, Take 850 mg by mouth 3 (three) times daily. , Disp: , Rfl:  .  Misc Natural Products (OSTEO BI-FLEX TRIPLE STRENGTH PO), Take 1 tablet by mouth 2 (two) times daily. , Disp: , Rfl:  .  Omega-3 Fatty Acids (FISH OIL) 1200 MG CAPS, Take 1,200 mg by mouth daily. , Disp: , Rfl:  .  PARoxetine (PAXIL) 20 MG tablet, Take 1 tablet (20 mg total) by mouth daily., Disp: 30 tablet, Rfl: 6 .  rivaroxaban (XARELTO) 20 MG TABS tablet, Take 1 tablet (20 mg total)  by mouth daily with supper., Disp: 10 tablet, Rfl: 0 .  zolpidem (AMBIEN) 10 MG tablet, 1/2 TO 1 TABLET BY MOUTH AT BEDTIME (Patient taking differently: 5mg  to 10mg  tablet at bedtime), Disp: 30 tablet, Rfl: 5 .  Cyanocobalamin (VITAMIN B 12 PO), Take 1 tablet by mouth 2 (two) times daily., Disp: , Rfl:  .  neomycin-polymyxin-hydrocortisone (CORTISPORIN) otic solution, Place 4 drops into the left ear 4 (four) times daily. (Patient not taking: Reported on 09/29/2017), Disp: 10 mL, Rfl: 1  Review of Systems  Constitutional: Negative for fever.  HENT: Positive for postnasal drip.   Respiratory: Positive for cough and shortness of breath.     Social History   Tobacco Use  . Smoking status: Former Smoker    Years: 45.00    Types: Cigars    Last attempt to quit: 04/08/2012     Years since quitting: 5.5  . Smokeless tobacco: Never Used  . Tobacco comment: cigarettes and cigars  Substance Use Topics  . Alcohol use: No    Alcohol/week: 0.0 oz    Comment: quit 2013   Objective:   BP 124/62 (BP Location: Left Arm, Patient Position: Sitting, Cuff Size: Large)   Pulse 68   Temp 97.7 F (36.5 C)   Resp 16   Wt 255 lb (115.7 kg)   BMI 36.59 kg/m  Vitals:   11/10/17 0952  BP: 124/62  Pulse: 68  Resp: 16  Temp: 97.7 F (36.5 C)  Weight: 255 lb (115.7 kg)     Physical Exam  General Appearance:    Alert, cooperative, no distress  HENT:   ENT exam normal, no neck nodes or sinus tenderness. Small amount post nasal drainage.   Eyes:    PERRL, conjunctiva/corneas clear, EOM's intact       Lungs:     Clear to auscultation bilaterally, respirations unlabored  Heart:    Regular rate and rhythm  Neurologic:   Awake, alert, oriented x 3. No apparent focal neurological           defect.           Assessment & Plan:     1. Chronic cough  - montelukast (SINGULAIR) 10 MG tablet; Take 1 tablet (10 mg total) by mouth daily. For chronic cough  Dispense: 30 tablet; Refill: 2       Lelon Huh, MD  [facility]

## 2017-11-11 DIAGNOSIS — M79642 Pain in left hand: Secondary | ICD-10-CM | POA: Diagnosis not present

## 2017-11-14 DIAGNOSIS — M79642 Pain in left hand: Secondary | ICD-10-CM | POA: Diagnosis not present

## 2017-11-17 ENCOUNTER — Ambulatory Visit (INDEPENDENT_AMBULATORY_CARE_PROVIDER_SITE_OTHER): Payer: Medicare HMO | Admitting: *Deleted

## 2017-11-17 DIAGNOSIS — I639 Cerebral infarction, unspecified: Secondary | ICD-10-CM

## 2017-11-17 NOTE — Progress Notes (Signed)
Carelink Summary Report / Loop Recorder 

## 2017-11-20 DIAGNOSIS — M79642 Pain in left hand: Secondary | ICD-10-CM | POA: Diagnosis not present

## 2017-11-20 LAB — CUP PACEART REMOTE DEVICE CHECK
Date Time Interrogation Session: 20181213143632
Implantable Pulse Generator Implant Date: 20170317

## 2017-11-24 ENCOUNTER — Other Ambulatory Visit: Payer: Self-pay | Admitting: Family Medicine

## 2017-11-24 ENCOUNTER — Telehealth: Payer: Self-pay | Admitting: Family Medicine

## 2017-11-24 DIAGNOSIS — M79642 Pain in left hand: Secondary | ICD-10-CM | POA: Diagnosis not present

## 2017-11-26 NOTE — Telephone Encounter (Signed)
LMTCB and schedule AWV with [facility] if interested.  -MM

## 2017-11-28 DIAGNOSIS — M79642 Pain in left hand: Secondary | ICD-10-CM | POA: Diagnosis not present

## 2017-12-04 ENCOUNTER — Ambulatory Visit (INDEPENDENT_AMBULATORY_CARE_PROVIDER_SITE_OTHER): Payer: Medicare HMO

## 2017-12-04 VITALS — BP 132/62 | HR 72 | Temp 97.7°F | Ht 70.0 in | Wt 259.0 lb

## 2017-12-04 DIAGNOSIS — Z0000 Encounter for general adult medical examination without abnormal findings: Secondary | ICD-10-CM | POA: Diagnosis not present

## 2017-12-04 NOTE — Progress Notes (Signed)
Subjective:   Sean Hayden is a 71 y.o. male who presents for Medicare Annual/Subsequent preventive examination.  Review of Systems:  N/A  Cardiac Risk Factors include: advanced age (>26men, >12 women);diabetes mellitus;dyslipidemia;hypertension;male gender;obesity (BMI >30kg/m2)     Objective:    Vitals: BP 132/62 (BP Location: Right Arm)   Pulse 72   Temp 97.7 F (36.5 C) (Oral)   Ht 5\' 10"  (1.778 m)   Wt 259 lb (117.5 kg)   BMI 37.16 kg/m   Body mass index is 37.16 kg/m.  Advanced Directives 12/04/2017 09/29/2017 09/23/2017 09/15/2017 09/15/2017 12/06/2016 11/06/2016  Does Patient Have a Medical Advance Directive? Yes No Yes Yes Yes Yes No  Type of Paramedic of [place];Living will - [place];Living will Living will;Healthcare Power of Attorney -  Does patient want to make changes to medical advance directive? - - - Yes (ED - Information included in AVS) - - -  Copy of [place] in Chart? No - copy requested - - - - No - copy requested -    Tobacco Social History   Tobacco Use  Smoking Status Former Smoker  . Years: 45.00  . Types: Cigars  . Last attempt to quit: 04/08/2012  . Years since quitting: 5.6  Smokeless Tobacco Never Used  Tobacco Comment   cigarettes and cigars     Counseling given: Not Answered Comment: cigarettes and cigars   Clinical Intake:  Pre-visit preparation completed: Yes  Pain : No/denies pain Pain Score: 0-No pain     Nutritional Status: BMI > 30  Obese Nutritional Risks: None(does have a wound on his left hand- pt is seeing Dr Apolonio Schneiders for this) Diabetes: Yes(type 2) CBG done?: No Did pt. bring in CBG monitor from home?: No  How often do you need to have someone help you when you read instructions, pamphlets, or other written materials from your doctor or pharmacy?: 1 - Never  Interpreter Needed?: No  Information entered by ::  [facility], LPN  Past Medical History:  Diagnosis Date  . Aortic stenosis    moderate by 02/2016 echo  . B12 deficiency   . Cancer ([place])    skin cancer - face   . CVA (cerebral vascular accident) ([place])    02/2016 no residual  . Diabetes mellitus without complication (HCC)    Type II  . Diverticulosis   . Dyspnea    with activity  . Gout   . Gouty arthropathy   . Hemorrhoids   . History of kidney stones   . History of right-sided carotid endarterectomy 04/17/12  . Hyperplastic colon polyp   . Hypertension   . MRSA infection   . Neuropathy   . OSA (obstructive sleep apnea)   . PAF (paroxysmal atrial fibrillation) ([place])   . Personal history of urinary calculi 05/25/2015  . Pneumonia    1990's  . Seizures ([place])    Past Surgical History:  Procedure Laterality Date  . Arm fracture Left   . CAROTID ARTERY ANGIOPLASTY Right 04/17/2012   Dr. Delana Meyer, [facility]  . CAROTID ENDARTERECTOMY Right 04/17/2012   Dr. Delana Meyer, [facility]  . CHOLECYSTECTOMY  2005   lap  . COLONOSCOPY    . CYST EXCISION     "between legs"  . EP IMPLANTABLE DEVICE N/A 02/23/2016   Procedure: Loop Recorder Insertion;  Surgeon: Thompson Grayer, MD;  Location: [place] [facility];  Service: Cardiovascular;  Laterality: N/A;  .  HEMORRHOID SURGERY  2009  . INCISION AND DRAINAGE WOUND WITH NERVE AND ARTERY REPAIR Left 10/04/2017   Procedure: LEFT HAND WOUND EXPLORATION repair as necessary;  Surgeon: Iran Planas, MD;  Location: [place];  Service: Orthopedics;  Laterality: Left;  . TEE WITHOUT CARDIOVERSION N/A 02/23/2016   Procedure: TRANSESOPHAGEAL ECHOCARDIOGRAM (TEE);  Surgeon: Pixie Casino, MD;  Location: [facility] ENDOSCOPY;  Service: Cardiovascular;  Laterality: N/A;  ALSO GETTING A LOOP   Family History  Problem Relation Age of Onset  . Hodgkin's lymphoma Father   . Diabetes Mother    Social History   Socioeconomic History  . Marital status: Widowed    Spouse name: None  . Number of children: 2  . Years of education:  HS Grad  . Highest education level: 12th grade  Social Needs  . Financial resource strain: Not hard at all  . Food insecurity - worry: Never true  . Food insecurity - inability: Never true  . Transportation needs - medical: No  . Transportation needs - non-medical: No  Occupational History  . Occupation: Retired  Tobacco Use  . Smoking status: Former Smoker    Years: 45.00    Types: Cigars    Last attempt to quit: 04/08/2012    Years since quitting: 5.6  . Smokeless tobacco: Never Used  . Tobacco comment: cigarettes and cigars  Substance and Sexual Activity  . Alcohol use: No    Alcohol/week: 0.0 oz    Comment: quit 2013  . Drug use: No  . Sexual activity: None  Other Topics Concern  . None  Social History Narrative   Lives at home with his granddaughter.   Right-handed.   No caffeine use.        Outpatient Encounter Medications as of 12/04/2017  Medication Sig  . acyclovir (ZOVIRAX) 400 MG tablet 1/2 tablet up to 5 times a day for cold sores (Patient taking differently: Take 200 mg by mouth daily as needed. 5 times a day for cold sores)  . atorvastatin (LIPITOR) 20 MG tablet Take 20 mg by mouth every evening.   . benzocaine (ORAJEL) 10 % mucosal gel Use as directed 1 application in the mouth or throat as needed for mouth pain.  . Cholecalciferol (VITAMIN D PO) Take 1 tablet by mouth daily.  . Docosanol 10 % CREA Apply topically up to 5 times per day as needed until heals  . Flaxseed, Linseed, (FLAXSEED OIL) 1000 MG CAPS Take 1,000 mg by mouth 2 (two) times daily.  . fluticasone (FLONASE) 50 MCG/ACT nasal spray Place 2 sprays into both nostrils daily.  [name] gabapentin (NEURONTIN) 100 MG capsule Take 100 mg by mouth 3 (three) times daily.  . insulin detemir (LEVEMIR) 100 UNIT/ML injection Inject 0.3 mLs (30 Units total) into the skin 2 (two) times daily. (Patient taking differently: Inject 65 Units into the skin 2 (two) times daily. Taking 65 units hs and 65 units am)  .  levETIRAcetam (KEPPRA) 500 MG tablet Take 2 tablets (1,000 mg total) by mouth 2 (two) times daily. 2 in the am and 2 at bedtime  . lisinopril (PRINIVIL,ZESTRIL) 2.5 MG tablet Take 1 tablet (2.5 mg total) by mouth daily.  [name] loratadine (CLARITIN) 10 MG tablet Take 10 mg by mouth daily.   . metFORMIN (GLUCOPHAGE) 850 MG tablet Take 850 mg by mouth 3 (three) times daily.   . Misc Natural Products (OSTEO BI-FLEX TRIPLE STRENGTH PO) Take 1 tablet by mouth 2 (two) times daily.   [name]  montelukast (SINGULAIR) 10 MG tablet Take 1 tablet (10 mg total) by mouth daily. For chronic cough  . neomycin-polymyxin-hydrocortisone (CORTISPORIN) otic solution Place 4 drops into the left ear 4 (four) times daily. (Patient taking differently: Place 4 drops into the left ear 4 (four) times daily. )  . Omega-3 Fatty Acids (FISH OIL) 1200 MG CAPS Take 1,200 mg by mouth daily.   [name] PARoxetine (PAXIL) 20 MG tablet TAKE ONE TABLET EVERY DAY  . rivaroxaban (XARELTO) 20 MG TABS tablet Take 1 tablet (20 mg total) by mouth daily with supper.  . zolpidem (AMBIEN) 10 MG tablet 1/2 TO 1 TABLET BY MOUTH AT BEDTIME (Patient taking differently: 5mg  to 10mg  tablet at bedtime)  . [DISCONTINUED] Cyanocobalamin (VITAMIN B 12 PO) Take 1 tablet by mouth 2 (two) times daily.   No facility-administered encounter medications on file as of 12/04/2017.     Activities of Daily Living In your present state of health, do you have any difficulty performing the following activities: 12/04/2017 10/04/2017  Hearing? Y N  Comment has seen an audiologist and was told he has 40% hearing loss  -  Vision? Y N  Comment wears glasses, has trouble seeing when a seizure is coming on -  Difficulty concentrating or making decisions? N N  Walking or climbing stairs? N Y  Dressing or bathing? N N  Doing errands, shopping? N -  Preparing Food and eating ? N -  Using the Toilet? N -  In the past six months, have you accidently leaked urine? Y -  Comment  occasionally -  Do you have problems with loss of bowel control? N -  Managing your Medications? N -  Managing your Finances? N -  Housekeeping or managing your Housekeeping? N -  Some recent data might be hidden    Patient Care Team: Birdie Sons, MD as PCP - General (Family Medicine) Dew, Erskine Squibb, MD as Referring Physician (Vascular Surgery) Marcial Pacas, MD as Consulting Physician (Neurology) Jennette Kettle, MD as Referring Physician (Internal Medicine) Long, Susette Racer, RN as Registered Nurse Christene Lye, MD (General Surgery)   Assessment:   This is a routine wellness examination for [place].  Exercise Activities and Dietary recommendations Current Exercise Habits: Home exercise routine(currently in rehab x 2 days and completes hand exercises), Frequency (Times/Week): 2, Intensity: Mild  Goals    . DIET - REDUCE SUGAR INTAKE     Recommend to cut out sugar and any desserts in diet to help aid in weight loss.        Fall Risk Fall Risk  12/04/2017 12/06/2016 05/03/2016  Falls in the past year? Yes No No  Number falls in past yr: 1 - -  Injury with Fall? Yes - -  Comment open wound on left hand and currently in rehab - -  Follow up Falls prevention discussed - -   Is the patient's home free of loose throw rugs in walkways, pet beds, electrical cords, etc?   yes      Grab bars in the bathroom? No, but pt plans to get these placed soon.      Handrails on the stairs?   yes      Adequate lighting?   yes  Timed Get Up and Go Performed: N/A  Depression Screen PHQ 2/9 Scores 12/04/2017 12/06/2016 05/03/2016 05/03/2016  PHQ - 2 Score 5 1 1  0  PHQ- 9 Score 15 - - -    Cognitive Function: Pt declined screening today.  6CIT Screen 12/06/2016  What Year? 4 points  What month? 0 points  What time? 0 points  Count back from 20 0 points  Months in reverse 0 points  Repeat phrase 2 points  Total Score 6    Immunization History  Administered Date(s)  Administered  . Influenza, High Dose Seasonal PF 08/25/2015, 08/01/2017  . Influenza-Unspecified 09/23/2014  . Pneumococcal Conjugate-13 11/01/2014  . Pneumococcal Polysaccharide-23 11/15/2008, 04/27/2012  . Tdap 04/17/2010, 07/23/2013, 09/15/2017  . Zoster 09/23/2014    Qualifies for Shingles Vaccine? Due for Shingles vaccine. Declined my offer to administer today. Education has been provided regarding the importance of this vaccine. Pt has been advised to call her insurance company to determine her out of pocket expense. Advised she may also receive this vaccine at her local pharmacy or Health Dept. Verbalized acceptance and understanding.  Screening Tests Health Maintenance  Topic Date Due  . COLON CANCER SCREENING ANNUAL FOBT  05/29/2016  . FOOT EXAM  09/08/2017  . OPHTHALMOLOGY EXAM  02/24/2018  . HEMOGLOBIN A1C  05/06/2018  . TETANUS/TDAP  09/16/2027  . INFLUENZA VACCINE  Completed  . Hepatitis C Screening  Completed  . PNA vac Low Risk Adult  Completed   Cancer Screenings: Lung: Low Dose CT Chest recommended if Age 51-80 years, 30 pack-year currently smoking OR have quit w/in 15years. Patient does qualify, however pt declines an order to have this complete. Colorectal: Completed at [facility] per pt. Requested records at next OV.     Additional Screenings:  Hepatitis B/HIV/Syphillis: Pt declines today.  Hepatitis C Screening: Pt declines today.      Plan:  I have personally reviewed and addressed the Medicare Annual Wellness questionnaire and have noted the following in the patient's chart:  A. Medical and social history B. Use of alcohol, tobacco or illicit drugs  C. Current medications and supplements D. Functional ability and status E.  Nutritional status F.  Physical activity G. Advance directives H. List of other physicians I.  Hospitalizations, surgeries, and ER visits in previous 12 months J.  [place] such as hearing and vision if needed, cognitive and  depression L. Referrals and appointments - none  In addition, I have reviewed and discussed with patient certain preventive protocols, quality metrics, and best practice recommendations. A written personalized care plan for preventive services as well as general preventive health recommendations were provided to patient.  See attached scanned questionnaire for additional information.   Signed,  Fabio Neighbors, LPN Nurse Health Advisor   Nurse Recommendations: Pt needs a diabetic foot exam at next OV. Pt states he had a FOBT at the [place]. Requested pt to bring results to next OV to update our records.

## 2017-12-04 NOTE — Patient Instructions (Signed)
Sean Hayden , Thank you for taking time to come for your Medicare Wellness Visit. I appreciate your ongoing commitment to your health goals. Please review the following plan we discussed and let me know if I can assist you in the future.   Screening recommendations/referrals: Colonoscopy: Pt had this completed through the [place]. Requested copy of this at next OV. Recommended yearly ophthalmology/optometry visit for glaucoma screening and checkup Recommended yearly dental visit for hygiene and checkup  Vaccinations: Influenza vaccine: Up to date Pneumococcal vaccine: Up to date Tdap vaccine: Up to date Shingles vaccine: Received the Zostavax in 2015. [facility] about receiving the new Shingrix vaccines.   Advanced directives: Please bring a copy of your POA (Power of Attorney) and/or Living Will to your next appointment.   Conditions/risks identified: Fall risk prevention; Obesity- recommend to cut out sugar and any desserts in diet to help aid in weight loss.   Next appointment: 12/10/17 @ 9:00 AM  Preventive Care 72 Years and Older, Male Preventive care refers to lifestyle choices and visits with your health care provider that can promote health and wellness. What does preventive care include?  A yearly physical exam. This is also called an annual well check.  Dental exams once or twice a year.  Routine eye exams. Ask your health care provider how often you should have your eyes checked.  Personal lifestyle choices, including:  Daily care of your teeth and gums.  Regular physical activity.  Eating a healthy diet.  Avoiding tobacco and drug use.  Limiting alcohol use.  Practicing safe sex.  Taking low doses of aspirin every day.  Taking vitamin and mineral supplements as recommended by your health care provider. What happens during an annual well check? The services and screenings done by your health care provider during your annual well check will depend on your  age, overall health, lifestyle risk factors, and family history of disease. Counseling  Your health care provider may ask you questions about your:  Alcohol use.  Tobacco use.  Drug use.  Emotional well-being.  Home and relationship well-being.  Sexual activity.  Eating habits.  History of falls.  Memory and ability to understand (cognition).  Work and work Statistician. Screening  You may have the following tests or measurements:  Height, weight, and BMI.  Blood pressure.  Lipid and cholesterol levels. These may be checked every 5 years, or more frequently if you are over 72 years old.  Skin check.  Lung cancer screening. You may have this screening every year starting at age 72 if you have a 30-pack-year history of smoking and currently smoke or have quit within the past 15 years.  Fecal occult blood test (FOBT) of the stool. You may have this test every year starting at age 72.  Flexible sigmoidoscopy or colonoscopy. You may have a sigmoidoscopy every 5 years or a colonoscopy every 10 years starting at age 72.  Prostate cancer screening. Recommendations will vary depending on your family history and other risks.  Hepatitis C blood test.  Hepatitis B blood test.  Sexually transmitted disease (STD) testing.  Diabetes screening. This is done by checking your blood sugar (glucose) after you have not eaten for a while (fasting). You may have this done every 1-3 years.  Abdominal aortic aneurysm (AAA) screening. You may need this if you are a current or former smoker.  Osteoporosis. You may be screened starting at age 72 if you are at high risk. Talk with your  health care provider about your test results, treatment options, and if necessary, the need for more tests. Vaccines  Your health care provider may recommend certain vaccines, such as:  Influenza vaccine. This is recommended every year.  Tetanus, diphtheria, and acellular pertussis (Tdap, Td) vaccine. You  may need a Td booster every 10 years.  Zoster vaccine. You may need this after age 72.  Pneumococcal 13-valent conjugate (PCV13) vaccine. One dose is recommended after age 65.  Pneumococcal polysaccharide (PPSV23) vaccine. One dose is recommended after age 72. Talk to your health care provider about which screenings and vaccines you need and how often you need them. This information is not intended to replace advice given to you by your health care provider. Make sure you discuss any questions you have with your health care provider. Document Released: 12/22/2015 Document Revised: 08/14/2016 Document Reviewed: 09/26/2015 Elsevier Interactive Patient Education  2017 [place] Prevention in the Home Falls can cause injuries. They can happen to people of all ages. There are many things you can do to make your home safe and to help prevent falls. What can I do on the outside of my home?  Regularly fix the edges of walkways and driveways and fix any cracks.  Remove anything that might make you trip as you walk through a door, such as a raised step or threshold.  Trim any bushes or trees on the path to your home.  Use bright outdoor lighting.  Clear any walking paths of anything that might make someone trip, such as rocks or tools.  Regularly check to see if handrails are loose or broken. Make sure that both sides of any steps have handrails.  Any raised decks and porches should have guardrails on the edges.  Have any leaves, snow, or ice cleared regularly.  Use sand or salt on walking paths during winter.  Clean up any spills in your garage right away. This includes oil or grease spills. What can I do in the bathroom?  Use night lights.  Install grab bars by the toilet and in the tub and shower. Do not use towel bars as grab bars.  Use non-skid mats or decals in the tub or shower.  If you need to sit down in the shower, use a plastic, non-slip stool.  Keep the floor  dry. Clean up any water that spills on the floor as soon as it happens.  Remove soap buildup in the tub or shower regularly.  Attach bath mats securely with double-sided non-slip rug tape.  Do not have throw rugs and other things on the floor that can make you trip. What can I do in the bedroom?  Use night lights.  Make sure that you have a light by your bed that is easy to reach.  Do not use any sheets or blankets that are too big for your bed. They should not hang down onto the floor.  Have a firm chair that has side arms. You can use this for support while you get dressed.  Do not have throw rugs and other things on the floor that can make you trip. What can I do in the kitchen?  Clean up any spills right away.  Avoid walking on wet floors.  Keep items that you use a lot in easy-to-reach places.  If you need to reach something above you, use a strong step stool that has a grab bar.  Keep electrical cords out of the way.  Do not use  floor polish or wax that makes floors slippery. If you must use wax, use non-skid floor wax.  Do not have throw rugs and other things on the floor that can make you trip. What can I do with my stairs?  Do not leave any items on the stairs.  Make sure that there are handrails on both sides of the stairs and use them. Fix handrails that are broken or loose. Make sure that handrails are as long as the stairways.  Check any carpeting to make sure that it is firmly attached to the stairs. Fix any carpet that is loose or worn.  Avoid having throw rugs at the top or bottom of the stairs. If you do have throw rugs, attach them to the floor with carpet tape.  Make sure that you have a light switch at the top of the stairs and the bottom of the stairs. If you do not have them, ask someone to add them for you. What else can I do to help prevent falls?  Wear shoes that:  Do not have high heels.  Have rubber bottoms.  Are comfortable and fit you  well.  Are closed at the toe. Do not wear sandals.  If you use a stepladder:  Make sure that it is fully opened. Do not climb a closed stepladder.  Make sure that both sides of the stepladder are locked into place.  Ask someone to hold it for you, if possible.  Clearly mark and make sure that you can see:  Any grab bars or handrails.  First and last steps.  Where the edge of each step is.  Use tools that help you move around (mobility aids) if they are needed. These include:  Canes.  Walkers.  Scooters.  Crutches.  Turn on the lights when you go into a dark area. Replace any light bulbs as soon as they burn out.  Set up your furniture so you have a clear path. Avoid moving your furniture around.  If any of your floors are uneven, fix them.  If there are any pets around you, be aware of where they are.  Review your medicines with your doctor. Some medicines can make you feel dizzy. This can increase your chance of falling. Ask your doctor what other things that you can do to help prevent falls. This information is not intended to replace advice given to you by your health care provider. Make sure you discuss any questions you have with your health care provider. Document Released: 09/21/2009 Document Revised: 05/02/2016 Document Reviewed: 12/30/2014 Elsevier Interactive Patient Education  2017 [employer].

## 2017-12-05 DIAGNOSIS — M79642 Pain in left hand: Secondary | ICD-10-CM | POA: Diagnosis not present

## 2017-12-08 DIAGNOSIS — M79642 Pain in left hand: Secondary | ICD-10-CM | POA: Diagnosis not present

## 2017-12-10 ENCOUNTER — Encounter: Payer: Medicare HMO | Admitting: Family Medicine

## 2017-12-11 ENCOUNTER — Ambulatory Visit: Payer: Self-pay | Admitting: Family Medicine

## 2017-12-12 DIAGNOSIS — M79642 Pain in left hand: Secondary | ICD-10-CM | POA: Diagnosis not present

## 2017-12-15 ENCOUNTER — Ambulatory Visit (INDEPENDENT_AMBULATORY_CARE_PROVIDER_SITE_OTHER): Payer: Medicare HMO | Admitting: *Deleted

## 2017-12-15 DIAGNOSIS — I639 Cerebral infarction, unspecified: Secondary | ICD-10-CM | POA: Diagnosis not present

## 2017-12-15 DIAGNOSIS — M25642 Stiffness of left hand, not elsewhere classified: Secondary | ICD-10-CM | POA: Diagnosis not present

## 2017-12-16 NOTE — Progress Notes (Signed)
Carelink Summary Report / Loop Recorder 

## 2017-12-19 DIAGNOSIS — M25642 Stiffness of left hand, not elsewhere classified: Secondary | ICD-10-CM | POA: Diagnosis not present

## 2017-12-22 ENCOUNTER — Encounter: Payer: Self-pay | Admitting: Family Medicine

## 2017-12-22 ENCOUNTER — Ambulatory Visit (INDEPENDENT_AMBULATORY_CARE_PROVIDER_SITE_OTHER): Payer: Medicare HMO | Admitting: Family Medicine

## 2017-12-22 VITALS — BP 132/68 | HR 68 | Temp 97.9°F | Resp 16 | Wt 261.0 lb

## 2017-12-22 DIAGNOSIS — L03113 Cellulitis of right upper limb: Secondary | ICD-10-CM | POA: Diagnosis not present

## 2017-12-22 MED ORDER — CEPHALEXIN 500 MG PO CAPS: 500.0000 mg | ORAL_CAPSULE | Freq: Four times a day (QID) | ORAL | 0 refills | Status: AC

## 2017-12-22 NOTE — Progress Notes (Signed)
Patient: Sean Hayden Male    DOB: Dec 25, 1945   72 y.o.   MRN: [id number] Visit Date: 12/22/2017  Today's Provider: Lelon Huh, MD   Chief Complaint  Patient presents with  . Abrasion  . Fall   Subjective:    HPI Patient c/o abrasion on his right elbow and his right knee due to a fall about 2 weeks ago. He feels like his abrasions is not healing. He also reports that he hit his right elbow on the outside of a door and now he has developed redness and swelling. He has only used neosporin for his symptoms.    Allergies  Allergen Reactions  . Actos [Pioglitazone]     UNSPECIFIED REACTION      Current Outpatient Medications:  .  acyclovir (ZOVIRAX) 400 MG tablet, 1/2 tablet up to 5 times a day for cold sores (Patient taking differently: Take 200 mg by mouth daily as needed. 5 times a day for cold sores), Disp: 30 tablet, Rfl: 5 .  atorvastatin (LIPITOR) 20 MG tablet, Take 20 mg by mouth every evening. , Disp: , Rfl:  .  benzocaine (ORAJEL) 10 % mucosal gel, Use as directed 1 application in the mouth or throat as needed for mouth pain., Disp: , Rfl:  .  Cholecalciferol (VITAMIN D PO), Take 1 tablet by mouth daily., Disp: , Rfl:  .  Docosanol 10 % CREA, Apply topically up to 5 times per day as needed until heals, Disp: 2 g, Rfl: 3 .  Flaxseed, Linseed, (FLAXSEED OIL) 1000 MG CAPS, Take 1,000 mg by mouth 2 (two) times daily., Disp: , Rfl:  .  fluticasone (FLONASE) 50 MCG/ACT nasal spray, Place 2 sprays into both nostrils daily., Disp: 16 g, Rfl: 1 .  gabapentin (NEURONTIN) 100 MG capsule, Take 100 mg by mouth 3 (three) times daily., Disp: , Rfl:  .  insulin detemir (LEVEMIR) 100 UNIT/ML injection, Inject 0.3 mLs (30 Units total) into the skin 2 (two) times daily. (Patient taking differently: Inject 65 Units into the skin 2 (two) times daily. Taking 65 units hs and 65 units am), Disp: 100 mL, Rfl: 1 .  levETIRAcetam (KEPPRA) 500 MG tablet, Take 2 tablets (1,000 mg total) by  mouth 2 (two) times daily. 2 in the am and 2 at bedtime, Disp: 120 tablet, Rfl: 6 .  lisinopril (PRINIVIL,ZESTRIL) 2.5 MG tablet, Take 1 tablet (2.5 mg total) by mouth daily., Disp: 30 tablet, Rfl: 2 .  loratadine (CLARITIN) 10 MG tablet, Take 10 mg by mouth daily. , Disp: , Rfl:  .  metFORMIN (GLUCOPHAGE) 850 MG tablet, Take 850 mg by mouth 3 (three) times daily. , Disp: , Rfl:  .  Misc Natural Products (OSTEO BI-FLEX TRIPLE STRENGTH PO), Take 1 tablet by mouth 2 (two) times daily. , Disp: , Rfl:  .  montelukast (SINGULAIR) 10 MG tablet, Take 1 tablet (10 mg total) by mouth daily. For chronic cough, Disp: 30 tablet, Rfl: 2 .  neomycin-polymyxin-hydrocortisone (CORTISPORIN) otic solution, Place 4 drops into the left ear 4 (four) times daily. (Patient taking differently: Place 4 drops into the left ear 4 (four) times daily. ), Disp: 10 mL, Rfl: 1 .  Omega-3 Fatty Acids (FISH OIL) 1200 MG CAPS, Take 1,200 mg by mouth daily. , Disp: , Rfl:  .  PARoxetine (PAXIL) 20 MG tablet, TAKE ONE TABLET EVERY DAY, Disp: 30 tablet, Rfl: 12 .  rivaroxaban (XARELTO) 20 MG TABS tablet, Take 1 tablet (20  mg total) by mouth daily with supper., Disp: 10 tablet, Rfl: 0 .  zolpidem (AMBIEN) 10 MG tablet, 1/2 TO 1 TABLET BY MOUTH AT BEDTIME (Patient taking differently: 5mg  to 10mg  tablet at bedtime), Disp: 30 tablet, Rfl: 5  Review of Systems  Constitutional: Negative for appetite change, chills and fever.  Respiratory: Negative for chest tightness, shortness of breath and wheezing.   Cardiovascular: Negative for chest pain and palpitations.  Gastrointestinal: Negative for abdominal pain, nausea and vomiting.  Skin: Positive for wound.    Social History   Tobacco Use  . Smoking status: Former Smoker    Years: 45.00    Types: Cigars    Last attempt to quit: 04/08/2012    Years since quitting: 5.7  . Smokeless tobacco: Never Used  . Tobacco comment: cigarettes and cigars  Substance Use Topics  . Alcohol use: No     Alcohol/week: 0.0 oz    Comment: quit 2013   Objective:   BP 132/68 (BP Location: Left Arm, Patient Position: Sitting, Cuff Size: Large)   Pulse 68   Temp 97.9 F (36.6 C)   Resp 16   Wt 261 lb (118.4 kg)   SpO2 98%   BMI 37.45 kg/m  Vitals:   12/22/17 1556  BP: 132/68  Pulse: 68  Resp: 16  Temp: 97.9 F (36.6 C)  SpO2: 98%  Weight: 261 lb (118.4 kg)     Physical Exam  General appearance: alert, well developed, well nourished, cooperative and in no distress Head: Normocephalic, without obvious abnormality, atraumatic Respiratory: Respirations even and unlabored, normal respiratory rate Extremities: Mild swelling and dull red over right olecranon. Scabbed abrasion, nickel sized, with 1 cm surrounding erythema      Assessment & Plan:     1. Cellulitis of right upper extremity  - cephALEXin (KEFLEX) 500 MG capsule; Take 1 capsule (500 mg total) by mouth 4 (four) times daily for 7 days.  Dispense: 28 capsule; Refill: 0       Lelon Huh, MD  [facility]

## 2017-12-25 DIAGNOSIS — M25642 Stiffness of left hand, not elsewhere classified: Secondary | ICD-10-CM | POA: Diagnosis not present

## 2017-12-26 LAB — CUP PACEART REMOTE DEVICE CHECK
Date Time Interrogation Session: 20190118145657
MDC IDC PG IMPLANT DT: 20170317

## 2017-12-29 ENCOUNTER — Encounter: Payer: Self-pay | Admitting: Neurology

## 2017-12-29 ENCOUNTER — Ambulatory Visit: Payer: Medicare HMO | Admitting: Neurology

## 2017-12-29 VITALS — BP 151/75 | HR 92 | Ht 70.0 in | Wt 259.0 lb

## 2017-12-29 DIAGNOSIS — R569 Unspecified convulsions: Secondary | ICD-10-CM | POA: Diagnosis not present

## 2017-12-29 DIAGNOSIS — I679 Cerebrovascular disease, unspecified: Secondary | ICD-10-CM | POA: Diagnosis not present

## 2017-12-29 MED ORDER — LEVETIRACETAM 500 MG PO TABS: ORAL_TABLET | ORAL | 4 refills | Status: DC

## 2017-12-29 NOTE — Progress Notes (Signed)
[facility]  PATIENT: Sean Hayden DOB: [birth year]  HISTORY OF PRESENT ILLNESS:     Sean Hayden is a 72 years old right-handed male, accompanied by his son-in-law, seen in refer by his primary care Dr. Lelon Huh for evaluation of stroke, initial evaluation was November 12 2016  He had a past medical history of diabetes, atrial fibrillation, hyperlipidemia, hypertension, is on chronic anticoagulation Xarelto treatments, he had history of right carotid artery endartectomy, stroke in the past,  He smokes. 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  On Oct 11, 2016, he had acute onset of left hand numbness, traveling to his left elbow, left shoulder, left upper and lower lip, he had difficulty talking, but denies confusion, difficulty using his left hand, symptoms last about 5 minutes.  He had a very similar episode in March 2017  In between episodes, he denies visual loss, dysarthria, gait abnormality, weakness.  We have personally reviewed MRI of the brain without contrast in November 2017, no acute abnormality, there was extensive supratentorium white matter disease   EEG on December 30 2016 showed mild background slowing, He has one more episode in Jan 2018, he had sudden onset of left hand numbness, uncontrollable left hand shaking while taking Depakote ER 500 mg every night   He was switched to Prien, titrating dose in 2018, now taking Keppra 500mg  2 tabs bid,   UPDATE Dec 29 2017: He is accompanied by his daughter-in-law at today's clinical visit, he is taking Keppra 500 mg 2 tablets twice a day, which has helped his spell significantly, still have recurrent spells once or twice each months, he describes as spells of sudden onset of numbness starting left fifth finger, spreading to involving whole left hand, stop at the wrist crease, then have left upper teeth numbness, no loss of consciousness, spell last about 5 minutes, before Keppra, numbness  trouble involving the whole left arm, sometimes right hand,  He also snores a lot complains of daytime sleepiness,  REVIEW OF SYSTEMS: Full 14 system review of systems performed and notable only for those listed, all others are neg:  As above  ALLERGIES: Allergies  Allergen Reactions  . Actos [Pioglitazone]     UNSPECIFIED REACTION     HOME MEDICATIONS: Outpatient Medications Prior to Visit  Medication Sig Dispense Refill  . acyclovir (ZOVIRAX) 400 MG tablet 1/2 tablet up to 5 times a day for cold sores (Patient taking differently: Take 200 mg by mouth daily as needed. 5 times a day for cold sores) 30 tablet 5  . atorvastatin (LIPITOR) 20 MG tablet Take 20 mg by mouth every evening.     . benzocaine (ORAJEL) 10 % mucosal gel Use as directed 1 application in the mouth or throat as needed for mouth pain.    . cephALEXin (KEFLEX) 500 MG capsule Take 1 capsule (500 mg total) by mouth 4 (four) times daily for 7 days. 28 capsule 0  . Cholecalciferol (VITAMIN D PO) Take 1 tablet by mouth daily.    . Docosanol 10 % CREA Apply topically up to 5 times per day as needed until heals 2 g 3  . Flaxseed, Linseed, (FLAXSEED OIL) 1000 MG CAPS Take 1,000 mg by mouth 2 (two) times daily.    . fluticasone (FLONASE) 50 MCG/ACT nasal spray Place 2 sprays into both nostrils daily. 16 g 1  . gabapentin (NEURONTIN) 100 MG capsule Take 100 mg by mouth 3 (three) times daily.    [name]  insulin detemir (LEVEMIR) 100 UNIT/ML injection Inject 0.3 mLs (30 Units total) into the skin 2 (two) times daily. (Patient taking differently: Inject 65 Units into the skin 2 (two) times daily. Taking 65 units hs and 65 units am) 100 mL 1  . levETIRAcetam (KEPPRA) 500 MG tablet Take 2 tablets (1,000 mg total) by mouth 2 (two) times daily. 2 in the am and 2 at bedtime 120 tablet 6  . lisinopril (PRINIVIL,ZESTRIL) 2.5 MG tablet Take 1 tablet (2.5 mg total) by mouth daily. 30 tablet 2  . loratadine (CLARITIN) 10 MG tablet Take 10 mg by  mouth daily.     . metFORMIN (GLUCOPHAGE) 850 MG tablet Take 850 mg by mouth 3 (three) times daily.     . Misc Natural Products (OSTEO BI-FLEX TRIPLE STRENGTH PO) Take 1 tablet by mouth 2 (two) times daily.     . montelukast (SINGULAIR) 10 MG tablet Take 1 tablet (10 mg total) by mouth daily. For chronic cough 30 tablet 2  . neomycin-polymyxin-hydrocortisone (CORTISPORIN) otic solution Place 4 drops into the left ear 4 (four) times daily. (Patient taking differently: Place 4 drops into the left ear 4 (four) times daily. ) 10 mL 1  . Omega-3 Fatty Acids (FISH OIL) 1200 MG CAPS Take 1,200 mg by mouth daily.     [name] PARoxetine (PAXIL) 20 MG tablet TAKE ONE TABLET EVERY DAY 30 tablet 12  . rivaroxaban (XARELTO) 20 MG TABS tablet Take 1 tablet (20 mg total) by mouth daily with supper. 10 tablet 0  . zolpidem (AMBIEN) 10 MG tablet 1/2 TO 1 TABLET BY MOUTH AT BEDTIME (Patient taking differently: 5mg  to 10mg  tablet at bedtime) 30 tablet 5   No facility-administered medications prior to visit.     PAST MEDICAL HISTORY: Past Medical History:  Diagnosis Date  . Aortic stenosis    moderate by 02/2016 echo  . B12 deficiency   . Cancer ([place])    skin cancer - face   . CVA (cerebral vascular accident) ([place])    02/2016 no residual  . Diabetes mellitus without complication (HCC)    Type II  . Diverticulosis   . Dyspnea    with activity  . Gout   . Gouty arthropathy   . Hemorrhoids   . History of kidney stones   . History of right-sided carotid endarterectomy 04/17/12  . Hyperplastic colon polyp   . Hypertension   . MRSA infection   . Neuropathy   . OSA (obstructive sleep apnea)   . PAF (paroxysmal atrial fibrillation) ([place])   . Personal history of urinary calculi 05/25/2015  . Pneumonia    1990's  . Seizures ([place])     PAST SURGICAL HISTORY: Past Surgical History:  Procedure Laterality Date  . Arm fracture Left   . CAROTID ARTERY ANGIOPLASTY Right 04/17/2012   Dr. Delana Meyer, [facility]  . CAROTID  ENDARTERECTOMY Right 04/17/2012   Dr. Delana Meyer, [facility]  . CHOLECYSTECTOMY  2005   lap  . COLONOSCOPY    . CYST EXCISION     "between legs"  . EP IMPLANTABLE DEVICE N/A 02/23/2016   Procedure: Loop Recorder Insertion;  Surgeon: Thompson Grayer, MD;  Location: [facility];  Service: Cardiovascular;  Laterality: N/A;  . HEMORRHOID SURGERY  2009  . INCISION AND DRAINAGE WOUND WITH NERVE AND ARTERY REPAIR Left 10/04/2017   Procedure: LEFT HAND WOUND EXPLORATION repair as necessary;  Surgeon: Iran Planas, MD;  Location: [facility];  Service: Orthopedics;  Laterality: Left;  .  TEE WITHOUT CARDIOVERSION N/A 02/23/2016   Procedure: TRANSESOPHAGEAL ECHOCARDIOGRAM (TEE);  Surgeon: Pixie Casino, MD;  Location: [facility];  Service: Cardiovascular;  Laterality: N/A;  ALSO GETTING A LOOP    FAMILY HISTORY: Family History  Problem Relation Age of Onset  . Hodgkin's lymphoma Father   . Diabetes Mother     SOCIAL HISTORY: Social History   Socioeconomic History  . Marital status: Widowed    Spouse name: Not on file  . Number of children: 2  . Years of education: HS Grad  . Highest education level: 12th grade  Social Needs  . Financial resource strain: Not hard at all  . Food insecurity - worry: Never true  . Food insecurity - inability: Never true  . Transportation needs - medical: No  . Transportation needs - non-medical: No  Occupational History  . Occupation: Retired  Tobacco Use  . Smoking status: Former Smoker    Years: 45.00    Types: Cigars    Last attempt to quit: 04/08/2012    Years since quitting: 5.7  . Smokeless tobacco: Never Used  . Tobacco comment: cigarettes and cigars  Substance and Sexual Activity  . Alcohol use: No    Alcohol/week: 0.0 oz    Comment: quit 2013  . Drug use: No  . Sexual activity: Not on file  Other Topics Concern  . Not on file  Social History Narrative   Lives at home with his granddaughter.   Right-handed.   No caffeine use.          PHYSICAL EXAM  Vitals:   12/29/17 0809  BP: (!) 151/75  Pulse: 92  Weight: 259 lb (117.5 kg)  Height: 5\' 10"  (1.778 m)   Body mass index is 37.16 kg/m.  Generalized: Well developed, obese male  no acute distress  Head: normocephalic and atraumatic,. Oropharynx benign  Neck: Supple, no carotid bruits  Cardiac: Regular rate rhythm, no murmur  Musculoskeletal: No deformity   Neurological examination   Mentation: Alert oriented to time, place, history taking. Attention span and concentration appropriate. Recent and remote memory intact.  Follows all commands speech and language fluent.   Cranial nerve II-XII: Pupils were equal round reactive to light extraocular movements were full, visual field were full on confrontational test. Facial sensation and strength were normal. hearing was intact to finger rubbing bilaterally. Uvula tongue midline. head turning and shoulder shrug were normal and symmetric.Tongue protrusion into cheek strength was normal.  Narrow oropharyngeal Motor: normal bulk and tone, full strength in the BUE, BLE, fine finger movements normal, no pronator drift. No focal weakness Sensory: normal and symmetric to light touch, pinprick, and  Vibration, in the upper and lower extremities  Coordination: finger-nose-finger, heel-to-shin bilaterally, no dysmetria Reflexes: Symmetric upper and lower plantar responses were flexor bilaterally. Gait and Station: Rising up from seated position with push off, cautious mildly unsteady gait Tandem gait is unsteady.  DIAGNOSTIC DATA (LABS, IMAGING, TESTING) - I reviewed patient records, labs, notes, testing and imaging myself where available.  Lab Results  Component Value Date   WBC 5.8 10/04/2017   HGB 10.0 (L) 10/04/2017   HCT 30.1 (L) 10/04/2017   MCV 92.0 10/04/2017   PLT 196 10/04/2017      Component Value Date/Time   NA 136 09/29/2017 2204   NA 138 03/03/2017 1019   K 4.7 09/29/2017 2204   K 4.6 11/23/2015   K  5.1 04/18/2012 0412   CL 99 (L) 09/29/2017 2204  CL 107 04/18/2012 0412   CO2 24 09/23/2017 1953   CO2 25 04/18/2012 0412   GLUCOSE 282 (H) 09/29/2017 2204   GLUCOSE 188 (H) 04/18/2012 0412   BUN 11 09/29/2017 2204   BUN 12 01/23/2017   BUN 13 04/18/2012 0412   CREATININE 0.70 09/29/2017 2204   CREATININE 1.07 04/18/2012 0412   CALCIUM 9.1 09/23/2017 1953   CALCIUM 8.7 03/03/2017 1019   PROT 7.5 09/23/2017 1953   ALBUMIN 3.9 09/23/2017 1953   AST 23 09/23/2017 1953   ALT 20 09/23/2017 1953   ALKPHOS 76 09/23/2017 1953   BILITOT 0.4 09/23/2017 1953   GFRNONAA >60 09/23/2017 1953   GFRNONAA >60 04/18/2012 0412   GFRAA >60 09/23/2017 1953   GFRAA 118.2 03/03/2017 1019   Lab Results  Component Value Date   CHOL 113 02/21/2016   HDL 34 (L) 02/21/2016   LDLCALC 37 02/21/2016   TRIG 209 (H) 02/21/2016   CHOLHDL 3.3 02/21/2016   Lab Results  Component Value Date   HGBA1C 7.2 11/06/2017   Lab Results  Component Value Date   VITAMINB12 1,146 (H) 02/28/2016   Lab Results  Component Value Date   TSH 1.82 11/23/2015    ASSESSMENT AND PLAN  72 y.o. year old male  Recurrent spells of traveling left hand numbness,  Suggestive of partial seizure,  He did respond significant improvement with Keppra, still has recurrent spells taking Keppra 500 mg 2 tablets twice a day, will increase nighttime dose to 3 tablets.  MRI of the brain showed sensitive left matter disease, more on the left hemisphere, History suggestive of obstructive sleep apnea  He also has multiple vascular risk factors, I have suggested sleep study, he wants to hold off,  Emphasized the importance of compliant with his medications, moderate exercise  Marcial Pacas, M.D. Ph.D.  [facility] [place], [place] [address] Phone: [phone] Fax:      [phone]

## 2017-12-31 DIAGNOSIS — M25642 Stiffness of left hand, not elsewhere classified: Secondary | ICD-10-CM | POA: Diagnosis not present

## 2018-01-07 DIAGNOSIS — M25642 Stiffness of left hand, not elsewhere classified: Secondary | ICD-10-CM | POA: Diagnosis not present

## 2018-01-14 ENCOUNTER — Ambulatory Visit (INDEPENDENT_AMBULATORY_CARE_PROVIDER_SITE_OTHER): Payer: Medicare HMO | Admitting: *Deleted

## 2018-01-14 DIAGNOSIS — I639 Cerebral infarction, unspecified: Secondary | ICD-10-CM

## 2018-01-14 DIAGNOSIS — M25642 Stiffness of left hand, not elsewhere classified: Secondary | ICD-10-CM | POA: Diagnosis not present

## 2018-01-14 NOTE — Progress Notes (Signed)
Carelink Summary Report / Loop Recorder 

## 2018-01-21 DIAGNOSIS — M25642 Stiffness of left hand, not elsewhere classified: Secondary | ICD-10-CM | POA: Diagnosis not present

## 2018-01-27 DIAGNOSIS — Z4789 Encounter for other orthopedic aftercare: Secondary | ICD-10-CM | POA: Diagnosis not present

## 2018-01-27 DIAGNOSIS — S61412D Laceration without foreign body of left hand, subsequent encounter: Secondary | ICD-10-CM | POA: Diagnosis not present

## 2018-01-27 DIAGNOSIS — M25642 Stiffness of left hand, not elsewhere classified: Secondary | ICD-10-CM | POA: Diagnosis not present

## 2018-02-02 ENCOUNTER — Ambulatory Visit: Payer: Self-pay | Admitting: Family Medicine

## 2018-02-02 ENCOUNTER — Ambulatory Visit (INDEPENDENT_AMBULATORY_CARE_PROVIDER_SITE_OTHER): Payer: Medicare HMO | Admitting: Family Medicine

## 2018-02-02 ENCOUNTER — Encounter: Payer: Self-pay | Admitting: Family Medicine

## 2018-02-02 VITALS — BP 118/68 | HR 68 | Temp 97.7°F | Resp 16 | Ht 70.0 in | Wt 257.0 lb

## 2018-02-02 DIAGNOSIS — I4891 Unspecified atrial fibrillation: Secondary | ICD-10-CM | POA: Diagnosis not present

## 2018-02-02 DIAGNOSIS — E1140 Type 2 diabetes mellitus with diabetic neuropathy, unspecified: Secondary | ICD-10-CM | POA: Diagnosis not present

## 2018-02-02 DIAGNOSIS — E118 Type 2 diabetes mellitus with unspecified complications: Secondary | ICD-10-CM | POA: Diagnosis not present

## 2018-02-02 DIAGNOSIS — I779 Disorder of arteries and arterioles, unspecified: Secondary | ICD-10-CM

## 2018-02-02 DIAGNOSIS — I10 Essential (primary) hypertension: Secondary | ICD-10-CM | POA: Diagnosis not present

## 2018-02-02 DIAGNOSIS — G4733 Obstructive sleep apnea (adult) (pediatric): Secondary | ICD-10-CM

## 2018-02-02 DIAGNOSIS — Z794 Long term (current) use of insulin: Secondary | ICD-10-CM

## 2018-02-02 DIAGNOSIS — Z1211 Encounter for screening for malignant neoplasm of colon: Secondary | ICD-10-CM

## 2018-02-02 DIAGNOSIS — G4700 Insomnia, unspecified: Secondary | ICD-10-CM | POA: Diagnosis not present

## 2018-02-02 DIAGNOSIS — I739 Peripheral vascular disease, unspecified: Secondary | ICD-10-CM

## 2018-02-02 LAB — POCT GLYCOSYLATED HEMOGLOBIN (HGB A1C)
Est. average glucose Bld gHb Est-mCnc: 232
Hemoglobin A1C: 9.7

## 2018-02-02 LAB — POCT UA - MICROALBUMIN: Microalbumin Ur, POC: 20 mg/L

## 2018-02-02 MED ORDER — EMPAGLIFLOZIN 10 MG PO TABS: 10.0000 mg | ORAL_TABLET | Freq: Every day | ORAL | 3 refills | Status: DC

## 2018-02-02 MED ORDER — ZOLPIDEM TARTRATE 10 MG PO TABS: ORAL_TABLET | ORAL | 1 refills | Status: DC

## 2018-02-02 NOTE — Progress Notes (Signed)
Patient: Sean Hayden Male    DOB: 1946/01/25   72 y.o.   MRN: [id number] Visit Date: 02/02/2018  Today's Provider: Lelon Huh, MD   Chief Complaint  Patient presents with  . Hypertension  . Coronary Artery Disease  . Atrial Fibrillation  . Insomnia   Subjective:    HPI  Hypertension, follow-up:  BP Readings from Last 3 Encounters:  02/02/18 118/68  12/29/17 (!) 151/75  12/22/17 132/68    He was last seen for hypertension 6 months ago.  BP at that visit was 120/70. Management since that visit includes no changes. He reports good compliance with treatment. He is not having side effects.  He is not exercising. He is adherent to low salt diet.   Outside blood pressures are checked occasionally. He is experiencing none.  Patient denies exertional chest pressure/discomfort, lower extremity edema and palpitations.   Cardiova scular risk factors include obesity (BMI >= 30 kg/m2) and sedentary lifestyle.   Weight trend: stable Wt Readings from Last 3 Encounters:  02/02/18 257 lb (116.6 kg)  12/29/17 259 lb (117.5 kg)  12/22/17 261 lb (118.4 kg)    Current diet: well balanced   CAD, follow up:  Patient was last seen in the office 6 months ago. No changes were made in his medications. Denies any chest pains or dyspnea.   A-fib follow up: Patient was last seen in the office 6 months ago. No changes were made in his medications. Patient is currently taking Xarelto 20mg , and reports he is tolerating well without adverse effects.   Insomnia, follow up: Patient was last seen in the office 6 months ago. He reports that his insurance no longer covers Ambien. He checked his pharmacy and insurance, and reports that he is willing to pay out of pocket for the medication. He is requesting a 90 day supply to be sent into the pharmacy. Patient reports that Ambien is highly effective, and can not sleep without it.   Follow up diabetes Had insulin increase to 70 in the  morning and 60 at [facility]    Allergies  Allergen Reactions  . Actos [Pioglitazone]     UNSPECIFIED REACTION      Current Outpatient Medications:  .  acyclovir (ZOVIRAX) 400 MG tablet, 1/2 tablet up to 5 times a day for cold sores (Patient taking differently: Take 200 mg by mouth daily as needed. 5 times a day for cold sores), Disp: 30 tablet, Rfl: 5 .  atorvastatin (LIPITOR) 20 MG tablet, Take 20 mg by mouth every evening. , Disp: , Rfl:  .  benzocaine (ORAJEL) 10 % mucosal gel, Use as directed 1 application in the mouth or throat as needed for mouth pain., Disp: , Rfl:  .  Cholecalciferol (VITAMIN D PO), Take 1 tablet by mouth daily., Disp: , Rfl:  .  Docosanol 10 % CREA, Apply topically up to 5 times per day as needed until heals, Disp: 2 g, Rfl: 3 .  Flaxseed, Linseed, (FLAXSEED OIL) 1000 MG CAPS, Take 1,000 mg by mouth 2 (two) times daily., Disp: , Rfl:  .  fluticasone (FLONASE) 50 MCG/ACT nasal spray, Place 2 sprays into both nostrils daily., Disp: 16 g, Rfl: 1 .  gabapentin (NEURONTIN) 100 MG capsule, Take 100 mg by mouth 3 (three) times daily., Disp: , Rfl:  .  insulin detemir (LEVEMIR) 100 UNIT/ML injection, Inject 0.3 mLs (30 Units total) into the skin 2 (two) times daily. (Patient taking  differently: Inject 65 Units into the skin 2 (two) times daily. Taking 65 units hs and 65 units am), Disp: 100 mL, Rfl: 1 .  levETIRAcetam (KEPPRA) 500 MG tablet, 2 in the am and 3 at bedtime, Disp: 450 tablet, Rfl: 4 .  lisinopril (PRINIVIL,ZESTRIL) 2.5 MG tablet, Take 1 tablet (2.5 mg total) by mouth daily., Disp: 30 tablet, Rfl: 2 .  loratadine (CLARITIN) 10 MG tablet, Take 10 mg by mouth daily. , Disp: , Rfl:  .  metFORMIN (GLUCOPHAGE) 850 MG tablet, Take 850 mg by mouth 3 (three) times daily. , Disp: , Rfl:  .  Misc Natural Products (OSTEO BI-FLEX TRIPLE STRENGTH PO), Take 1 tablet by mouth 2 (two) times daily. , Disp: , Rfl:  .  montelukast (SINGULAIR) 10 MG tablet, Take 1 tablet (10  mg total) by mouth daily. For chronic cough, Disp: 30 tablet, Rfl: 2 .  neomycin-polymyxin-hydrocortisone (CORTISPORIN) otic solution, Place 4 drops into the left ear 4 (four) times daily. (Patient taking differently: Place 4 drops into the left ear 4 (four) times daily. ), Disp: 10 mL, Rfl: 1 .  Omega-3 Fatty Acids (FISH OIL) 1200 MG CAPS, Take 1,200 mg by mouth daily. , Disp: , Rfl:  .  PARoxetine (PAXIL) 20 MG tablet, TAKE ONE TABLET EVERY DAY, Disp: 30 tablet, Rfl: 12 .  rivaroxaban (XARELTO) 20 MG TABS tablet, Take 1 tablet (20 mg total) by mouth daily with supper., Disp: 10 tablet, Rfl: 0 .  zolpidem (AMBIEN) 10 MG tablet, 1/2 TO 1 TABLET BY MOUTH AT BEDTIME (Patient taking differently: 5mg  to 10mg  tablet at bedtime), Disp: 30 tablet, Rfl: 5  Review of Systems  Constitutional: Negative.   Respiratory: Negative.   Cardiovascular: Negative for chest pain, palpitations and leg swelling.  Endocrine: Negative.   Musculoskeletal: Positive for arthralgias.  Neurological: Negative.   Psychiatric/Behavioral: Negative.     Social History   Tobacco Use  . Smoking status: Former Smoker    Years: 45.00    Types: Cigars    Last attempt to quit: 04/08/2012    Years since quitting: 5.8  . Smokeless tobacco: Never Used  . Tobacco comment: cigarettes and cigars  Substance Use Topics  . Alcohol use: No    Alcohol/week: 0.0 oz    Comment: quit 2013   Objective:   BP 118/68 (BP Location: Right Arm, Patient Position: Sitting, Cuff Size: Large)   Pulse 68   Temp 97.7 F (36.5 C)   Resp 16   Ht 5\' 10"  (1.778 m)   Wt 257 lb (116.6 kg)   BMI 36.88 kg/m  Vitals:   02/02/18 0817  BP: 118/68  Pulse: 68  Resp: 16  Temp: 97.7 F (36.5 C)  Weight: 257 lb (116.6 kg)  Height: 5\' 10"  (1.778 m)     Physical Exam   General Appearance:    Alert, cooperative, no distress, obese  Eyes:    PERRL, conjunctiva/corneas clear, EOM's intact       Lungs:     Clear to auscultation bilaterally,  respirations unlabored  Heart:    Regular rate and rhythm  Neurologic:   Awake, alert, oriented x 3. No apparent focal neurological           defect.         Lab Results  Component Value Date   HGBA1C 9.7 02/02/2018       Assessment & Plan:     1. Type 2 diabetes mellitus with diabetic neuropathy, with long-term current  use of insulin (HCC) Uncontrolled. Consider underlying vascular disease he would likely benefit from [place]. He is to call if his sugars stay under 100 or if BP stays under 110.  - POCT glycosylated hemoglobin (Hb A1C) - POCT UA - Microalbumin - empagliflozin (JARDIANCE) 10 MG TABS tablet; Take 10 mg by mouth daily.  Dispense: 30 tablet; Refill: 3  2. Carotid artery disease, unspecified laterality, unspecified type ([place]) Continue current dose of Xarelto.   3. Essential hypertension Well controlled.   4. Obstructive sleep apnea of adult He refuses another CPAP trial, but states he does sleep sitting up in chair every night. Counseled on potential risks of taking sedative medications with OSA. Recommend he talk to his dentist about jaw thrust dental appliance. I to keep sedatives at lowest effective dose.   5. Insomnia, unspecified type  - zolpidem (AMBIEN) 10 MG tablet; 1/2 TO 1 TABLET BY MOUTH AT BEDTIME  Dispense: 90 tablet; Refill: 1  6. Colon cancer screening  - Cologuard       Lelon Huh, MD  [facility]

## 2018-02-02 NOTE — Patient Instructions (Addendum)
   Talk to your dentist about appliance for sleep apnea   Call if you start seeing blood sugars consistently below 100, or blood pressure under 110

## 2018-02-05 DIAGNOSIS — M25642 Stiffness of left hand, not elsewhere classified: Secondary | ICD-10-CM | POA: Diagnosis not present

## 2018-02-06 LAB — CUP PACEART REMOTE DEVICE CHECK
Date Time Interrogation Session: 20190301155340
MDC IDC PG IMPLANT DT: 20170317

## 2018-02-13 DIAGNOSIS — M25642 Stiffness of left hand, not elsewhere classified: Secondary | ICD-10-CM | POA: Diagnosis not present

## 2018-02-16 ENCOUNTER — Ambulatory Visit (INDEPENDENT_AMBULATORY_CARE_PROVIDER_SITE_OTHER): Payer: Medicare HMO | Admitting: *Deleted

## 2018-02-16 ENCOUNTER — Telehealth: Payer: Self-pay | Admitting: Family Medicine

## 2018-02-16 DIAGNOSIS — I639 Cerebral infarction, unspecified: Secondary | ICD-10-CM

## 2018-02-16 NOTE — Telephone Encounter (Signed)
Order for cologuard faxed to [facility] °

## 2018-02-17 NOTE — Progress Notes (Signed)
Carelink Summary Report / Loop Recorder 

## 2018-02-18 DIAGNOSIS — M25642 Stiffness of left hand, not elsewhere classified: Secondary | ICD-10-CM | POA: Diagnosis not present

## 2018-02-20 DIAGNOSIS — Z1211 Encounter for screening for malignant neoplasm of colon: Secondary | ICD-10-CM | POA: Diagnosis not present

## 2018-02-21 LAB — COLOGUARD: COLOGUARD: NEGATIVE

## 2018-02-24 DIAGNOSIS — Z4789 Encounter for other orthopedic aftercare: Secondary | ICD-10-CM | POA: Diagnosis not present

## 2018-02-24 DIAGNOSIS — S61412D Laceration without foreign body of left hand, subsequent encounter: Secondary | ICD-10-CM | POA: Diagnosis not present

## 2018-02-24 DIAGNOSIS — M25642 Stiffness of left hand, not elsewhere classified: Secondary | ICD-10-CM | POA: Diagnosis not present

## 2018-03-03 ENCOUNTER — Telehealth: Payer: Self-pay

## 2018-03-03 NOTE — Telephone Encounter (Signed)
Pt advised.

## 2018-03-03 NOTE — Telephone Encounter (Signed)
-----   Message from Birdie Sons, MD sent at 03/03/2018  1:51 PM EDT ----- Please advise cologuard negative. Repeat in 3 years.

## 2018-03-23 ENCOUNTER — Ambulatory Visit (INDEPENDENT_AMBULATORY_CARE_PROVIDER_SITE_OTHER): Payer: Medicare HMO | Admitting: *Deleted

## 2018-03-23 DIAGNOSIS — I639 Cerebral infarction, unspecified: Secondary | ICD-10-CM

## 2018-03-23 NOTE — Progress Notes (Signed)
Carelink Summary Report / Loop Recorder 

## 2018-03-30 LAB — CUP PACEART REMOTE DEVICE CHECK
Date Time Interrogation Session: 20190422110231
MDC IDC PG IMPLANT DT: 20170317

## 2018-03-31 ENCOUNTER — Telehealth: Payer: Self-pay | Admitting: Cardiology

## 2018-03-31 NOTE — Telephone Encounter (Signed)
Spoke w/ pt and requested that he send a manual transmission b/c his home monitor has not updated in at least 14 days.   

## 2018-04-13 ENCOUNTER — Encounter: Payer: Self-pay | Admitting: Family Medicine

## 2018-04-13 ENCOUNTER — Ambulatory Visit (INDEPENDENT_AMBULATORY_CARE_PROVIDER_SITE_OTHER): Payer: Medicare HMO | Admitting: Family Medicine

## 2018-04-13 VITALS — BP 110/58 | HR 72 | Temp 98.0°F | Resp 16 | Wt 248.0 lb

## 2018-04-13 DIAGNOSIS — J40 Bronchitis, not specified as acute or chronic: Secondary | ICD-10-CM

## 2018-04-13 MED ORDER — LEVOFLOXACIN 750 MG PO TABS: 750.0000 mg | ORAL_TABLET | Freq: Every day | ORAL | 0 refills | Status: AC

## 2018-04-13 NOTE — Progress Notes (Signed)
Patient: Sean Hayden Male    DOB: 1946/10/25   72 y.o.   MRN: [id number] Visit Date: 04/13/2018  Today's Provider: Lelon Huh, MD   Chief Complaint  Patient presents with  . Cough   Subjective:    Cough  This is a new problem. The current episode started 1 to 4 weeks ago (2 weeks). The cough is productive of sputum. Associated symptoms include ear congestion, nasal congestion, rhinorrhea, a sore throat and wheezing. Pertinent negatives include no chest pain, chills, ear pain, fever, headaches, heartburn, hemoptysis, myalgias, postnasal drip, rash, shortness of breath, sweats or weight loss. The symptoms are aggravated by lying down and exercise. Treatments tried: claritin  The treatment provided mild relief. His past medical history is significant for bronchitis and pneumonia.    Patient has had cough and chest congestion for 2 weeks. Cough is productive. Patient also has symptoms of ear congestion, nasal congestion, runny nose, sore throat and wheezing. Patient has been taking Claritin with only mild relief.   Allergies  Allergen Reactions  . Actos [Pioglitazone]     UNSPECIFIED REACTION      Current Outpatient Medications:  .  acyclovir (ZOVIRAX) 400 MG tablet, 1/2 tablet up to 5 times a day for cold sores (Patient taking differently: Take 200 mg by mouth daily as needed. 5 times a day for cold sores), Disp: 30 tablet, Rfl: 5 .  atorvastatin (LIPITOR) 20 MG tablet, Take 20 mg by mouth every evening. , Disp: , Rfl:  .  benzocaine (ORAJEL) 10 % mucosal gel, Use as directed 1 application in the mouth or throat as needed for mouth pain., Disp: , Rfl:  .  Cholecalciferol (VITAMIN D PO), Take 1 tablet by mouth daily., Disp: , Rfl:  .  Docosanol 10 % CREA, Apply topically up to 5 times per day as needed until heals, Disp: 2 g, Rfl: 3 .  empagliflozin (JARDIANCE) 10 MG TABS tablet, Take 10 mg by mouth daily., Disp: 30 tablet, Rfl: 3 .  Flaxseed, Linseed, (FLAXSEED OIL) 1000  MG CAPS, Take 1,000 mg by mouth 2 (two) times daily., Disp: , Rfl:  .  fluticasone (FLONASE) 50 MCG/ACT nasal spray, Place 2 sprays into both nostrils daily., Disp: 16 g, Rfl: 1 .  gabapentin (NEURONTIN) 100 MG capsule, Take 100 mg by mouth 3 (three) times daily., Disp: , Rfl:  .  insulin detemir (LEVEMIR) 100 UNIT/ML injection, Inject 0.3 mLs (30 Units total) into the skin 2 (two) times daily. (Patient taking differently: Inject 65 Units into the skin 2 (two) times daily. Taking 65 units hs and 65 units am), Disp: 100 mL, Rfl: 1 .  levETIRAcetam (KEPPRA) 500 MG tablet, 2 in the am and 3 at bedtime, Disp: 450 tablet, Rfl: 4 .  lisinopril (PRINIVIL,ZESTRIL) 2.5 MG tablet, Take 1 tablet (2.5 mg total) by mouth daily., Disp: 30 tablet, Rfl: 2 .  loratadine (CLARITIN) 10 MG tablet, Take 10 mg by mouth daily. , Disp: , Rfl:  .  metFORMIN (GLUCOPHAGE) 850 MG tablet, Take 850 mg by mouth 3 (three) times daily. , Disp: , Rfl:  .  Misc Natural Products (OSTEO BI-FLEX TRIPLE STRENGTH PO), Take 1 tablet by mouth 2 (two) times daily. , Disp: , Rfl:  .  montelukast (SINGULAIR) 10 MG tablet, Take 1 tablet (10 mg total) by mouth daily. For chronic cough, Disp: 30 tablet, Rfl: 2 .  neomycin-polymyxin-hydrocortisone (CORTISPORIN) otic solution, Place 4 drops into the left ear 4 (  four) times daily. (Patient taking differently: Place 4 drops into the left ear 4 (four) times daily. ), Disp: 10 mL, Rfl: 1 .  Omega-3 Fatty Acids (FISH OIL) 1200 MG CAPS, Take 1,200 mg by mouth daily. , Disp: , Rfl:  .  PARoxetine (PAXIL) 20 MG tablet, TAKE ONE TABLET EVERY DAY, Disp: 30 tablet, Rfl: 12 .  rivaroxaban (XARELTO) 20 MG TABS tablet, Take 1 tablet (20 mg total) by mouth daily with supper., Disp: 10 tablet, Rfl: 0 .  zolpidem (AMBIEN) 10 MG tablet, 1/2 TO 1 TABLET BY MOUTH AT BEDTIME, Disp: 90 tablet, Rfl: 1  Review of Systems  Constitutional: Negative for appetite change, chills, fever and weight loss.  HENT: Positive for  congestion, rhinorrhea and sore throat. Negative for ear pain and postnasal drip.   Respiratory: Positive for cough and wheezing. Negative for hemoptysis, chest tightness and shortness of breath.   Cardiovascular: Negative for chest pain and palpitations.  Gastrointestinal: Negative for abdominal pain, heartburn, nausea and vomiting.  Musculoskeletal: Negative for myalgias.  Skin: Negative for rash.  Neurological: Negative for headaches.    Social History   Tobacco Use  . Smoking status: Former Smoker    Years: 45.00    Types: Cigars    Last attempt to quit: 04/08/2012    Years since quitting: 6.0  . Smokeless tobacco: Never Used  . Tobacco comment: cigarettes and cigars  Substance Use Topics  . Alcohol use: No    Alcohol/week: 0.0 oz    Comment: quit 2013   Objective:   BP (!) 110/58 (BP Location: Left Arm, Patient Position: Sitting, Cuff Size: Large)   Pulse 72   Temp 98 F (36.7 C) (Oral)   Resp 16   Wt 248 lb (112.5 kg)   SpO2 95%   BMI 35.58 kg/m     Physical Exam  General Appearance:    Alert, cooperative, no distress  HENT:   bilateral TM normal without fluid or infection, neck without nodes, throat normal without erythema or exudate and nasal mucosa pale and congested  Eyes:    PERRL, conjunctiva/corneas clear, EOM's intact       Lungs:     Occasional expiratory wheeze, no rales,  respirations unlabored  Heart:    Regular rate and rhythm  Neurologic:   Awake, alert, oriented x 3. No apparent focal neurological           defect.           Assessment & Plan:     1. Bronchitis  - levofloxacin (LEVAQUIN) 750 MG tablet; Take 1 tablet (750 mg total) by mouth daily for 7 days.  Dispense: 7 tablet; Refill: 0  Call if symptoms change or if not rapidly improving.          Lelon Huh, MD  [facility]

## 2018-04-14 ENCOUNTER — Encounter: Payer: Self-pay | Admitting: Family Medicine

## 2018-04-23 ENCOUNTER — Ambulatory Visit (INDEPENDENT_AMBULATORY_CARE_PROVIDER_SITE_OTHER): Payer: Medicare HMO | Admitting: *Deleted

## 2018-04-23 DIAGNOSIS — I639 Cerebral infarction, unspecified: Secondary | ICD-10-CM | POA: Diagnosis not present

## 2018-04-23 NOTE — Progress Notes (Addendum)
Did not send transmittion Carelink Summary Report / Loop Recorder

## 2018-04-27 LAB — CUP PACEART REMOTE DEVICE CHECK
Date Time Interrogation Session: 20190520102750
MDC IDC PG IMPLANT DT: 20170317

## 2018-05-15 LAB — CUP PACEART REMOTE DEVICE CHECK
Implantable Pulse Generator Implant Date: 20170317
MDC IDC SESS DTM: 20190607140557

## 2018-05-26 ENCOUNTER — Ambulatory Visit (INDEPENDENT_AMBULATORY_CARE_PROVIDER_SITE_OTHER): Payer: Medicare HMO | Admitting: *Deleted

## 2018-05-26 DIAGNOSIS — I639 Cerebral infarction, unspecified: Secondary | ICD-10-CM

## 2018-05-27 NOTE — Progress Notes (Signed)
Carelink Summary Report / Loop Recorder 

## 2018-06-01 ENCOUNTER — Other Ambulatory Visit: Payer: Self-pay | Admitting: Family Medicine

## 2018-06-01 DIAGNOSIS — G4700 Insomnia, unspecified: Secondary | ICD-10-CM

## 2018-06-02 ENCOUNTER — Ambulatory Visit: Payer: Self-pay | Admitting: Family Medicine

## 2018-06-08 ENCOUNTER — Ambulatory Visit (INDEPENDENT_AMBULATORY_CARE_PROVIDER_SITE_OTHER): Payer: Medicare HMO | Admitting: Family Medicine

## 2018-06-08 ENCOUNTER — Encounter: Payer: Self-pay | Admitting: Family Medicine

## 2018-06-08 VITALS — BP 110/54 | HR 72 | Temp 97.9°F | Resp 16 | Wt 248.0 lb

## 2018-06-08 DIAGNOSIS — I739 Peripheral vascular disease, unspecified: Secondary | ICD-10-CM

## 2018-06-08 DIAGNOSIS — I10 Essential (primary) hypertension: Secondary | ICD-10-CM

## 2018-06-08 DIAGNOSIS — E785 Hyperlipidemia, unspecified: Secondary | ICD-10-CM

## 2018-06-08 DIAGNOSIS — I779 Disorder of arteries and arterioles, unspecified: Secondary | ICD-10-CM

## 2018-06-08 DIAGNOSIS — E1140 Type 2 diabetes mellitus with diabetic neuropathy, unspecified: Secondary | ICD-10-CM | POA: Diagnosis not present

## 2018-06-08 DIAGNOSIS — Z794 Long term (current) use of insulin: Secondary | ICD-10-CM | POA: Diagnosis not present

## 2018-06-08 LAB — POCT GLYCOSYLATED HEMOGLOBIN (HGB A1C)
ESTIMATED AVERAGE GLUCOSE: 157
HEMOGLOBIN A1C: 7.1 % — AB (ref 4.0–5.6)

## 2018-06-08 MED ORDER — EMPAGLIFLOZIN 25 MG PO TABS: 25.0000 mg | ORAL_TABLET | Freq: Every day | ORAL | 4 refills | Status: DC

## 2018-06-08 NOTE — Progress Notes (Signed)
Patient: Sean Hayden Male    DOB: 08/11/1946   72 y.o.   MRN: [id number] Visit Date: 06/08/2018  Today's Provider: Lelon Huh, MD   Chief Complaint  Patient presents with  . Follow-up  . Diabetes  . Hypertension  . Coronary Artery Disease  . Insomnia   Subjective:    HPI   Diabetes Mellitus Type II, Follow-up:   Lab Results  Component Value Date   HGBA1C 9.7 02/02/2018   HGBA1C 7.2 11/06/2017   HGBA1C 7.3 01/23/2017   Last seen for diabetes 4 months ago.  Management since then includes; starting Jardiance 10mg  which he has been able to get from the [place].  He reports good compliance with treatment. He has since had electrolytes and creatinine checked at [facility] which were normal.  He is not having side effects. none Current symptoms include none and have been unchanged. Home blood sugar records: fasting range: 214  Episodes of hypoglycemia? no   Current Insulin Regimen: levemir 70 units in the morning and 65 at night.  Most Recent Eye Exam: 3/18 Weight trend: stable Prior visit with dietician: no Current diet: well balanced Current exercise: some  ----------------------------------------------------------------   Hypertension, follow-up:  BP Readings from Last 3 Encounters:  04/13/18 (!) 110/58  02/02/18 118/68  12/29/17 (!) 151/75    He was last seen for hypertension 4 months ago.  BP at that visit was 118/68. Management since that visit includes; no changes.He reports good compliance with treatment. He is not having side effects. none He is exercising. He is adherent to low salt diet.   Outside blood pressures are not checking. He is experiencing none.  Patient denies none.   Cardiovascular risk factors include diabetes mellitus.  Use of agents associated with hypertension: none.   ----------------------------------------------------------------  Wt Readings from Last 3 Encounters:  04/13/18 248 lb (112.5 kg)  02/02/18 257 lb (116.6 kg)    12/29/17 259 lb (117.5 kg)    Carotid artery disease, unspecified laterality, unspecified type ([place]) From 02/02/2018-no changes. Continued current dose of Xarelto. He was previously followed by Dr. Delana Meyer but has not had follow up for a few years.     Allergies  Allergen Reactions  . Actos [Pioglitazone]     UNSPECIFIED REACTION      Current Outpatient Medications:  .  acyclovir (ZOVIRAX) 400 MG tablet, 1/2 tablet up to 5 times a day for cold sores (Patient taking differently: Take 200 mg by mouth daily as needed. 5 times a day for cold sores), Disp: 30 tablet, Rfl: 5 .  atorvastatin (LIPITOR) 20 MG tablet, Take 20 mg by mouth every evening. , Disp: , Rfl:  .  benzocaine (ORAJEL) 10 % mucosal gel, Use as directed 1 application in the mouth or throat as needed for mouth pain., Disp: , Rfl:  .  Cholecalciferol (VITAMIN D PO), Take 1 tablet by mouth daily., Disp: , Rfl:  .  Docosanol 10 % CREA, Apply topically up to 5 times per day as needed until heals, Disp: 2 g, Rfl: 3 .  empagliflozin (JARDIANCE) 10 MG TABS tablet, Take 10 mg by mouth daily., Disp: 30 tablet, Rfl: 3 .  Flaxseed, Linseed, (FLAXSEED OIL) 1000 MG CAPS, Take 1,000 mg by mouth 2 (two) times daily., Disp: , Rfl:  .  fluticasone (FLONASE) 50 MCG/ACT nasal spray, Place 2 sprays into both nostrils daily., Disp: 16 g, Rfl: 1 .  gabapentin (NEURONTIN) 100 MG capsule, Take 100 mg by  mouth 3 (three) times daily., Disp: , Rfl:  .  insulin detemir (LEVEMIR) 100 UNIT/ML injection, Inject 0.3 mLs (30 Units total) into the skin 2 (two) times daily. (Patient taking differently: Inject 65 Units into the skin 2 (two) times daily. Taking 65 units hs and 65 units am), Disp: 100 mL, Rfl: 1 .  levETIRAcetam (KEPPRA) 500 MG tablet, 2 in the am and 3 at bedtime, Disp: 450 tablet, Rfl: 4 .  lisinopril (PRINIVIL,ZESTRIL) 2.5 MG tablet, Take 1 tablet (2.5 mg total) by mouth daily., Disp: 30 tablet, Rfl: 2 .  loratadine (CLARITIN) 10 MG tablet,  Take 10 mg by mouth daily. , Disp: , Rfl:  .  metFORMIN (GLUCOPHAGE) 850 MG tablet, Take 850 mg by mouth 3 (three) times daily. , Disp: , Rfl:  .  Misc Natural Products (OSTEO BI-FLEX TRIPLE STRENGTH PO), Take 1 tablet by mouth 2 (two) times daily. , Disp: , Rfl:  .  montelukast (SINGULAIR) 10 MG tablet, Take 1 tablet (10 mg total) by mouth daily. For chronic cough, Disp: 30 tablet, Rfl: 2 .  neomycin-polymyxin-hydrocortisone (CORTISPORIN) otic solution, Place 4 drops into the left ear 4 (four) times daily. (Patient taking differently: Place 4 drops into the left ear 4 (four) times daily. ), Disp: 10 mL, Rfl: 1 .  Omega-3 Fatty Acids (FISH OIL) 1200 MG CAPS, Take 1,200 mg by mouth daily. , Disp: , Rfl:  .  PARoxetine (PAXIL) 20 MG tablet, TAKE ONE TABLET EVERY DAY, Disp: 30 tablet, Rfl: 12 .  rivaroxaban (XARELTO) 20 MG TABS tablet, Take 1 tablet (20 mg total) by mouth daily with supper., Disp: 10 tablet, Rfl: 0 .  zolpidem (AMBIEN) 10 MG tablet, 1/2 TO 1 TABLET BY MOUTH AT BEDTIME, Disp: 90 tablet, Rfl: 1  Review of Systems  Constitutional: Negative for appetite change, chills and fever.  Respiratory: Negative for chest tightness, shortness of breath and wheezing.   Cardiovascular: Negative for chest pain and palpitations.  Gastrointestinal: Negative for abdominal pain, nausea and vomiting.    Social History   Tobacco Use  . Smoking status: Former Smoker    Years: 45.00    Types: Cigars    Last attempt to quit: 04/08/2012    Years since quitting: 6.1  . Smokeless tobacco: Never Used  . Tobacco comment: cigarettes and cigars  Substance Use Topics  . Alcohol use: No    Alcohol/week: 0.0 oz    Comment: quit 2013   Objective:   BP (!) 110/54 (BP Location: Right Arm, Patient Position: Sitting, Cuff Size: Large)   Pulse 72   Temp 97.9 F (36.6 C) (Oral)   Resp 16   Wt 248 lb (112.5 kg)   SpO2 97%   BMI 35.58 kg/m     Physical Exam  General Appearance:    Alert, cooperative, no  distress, obese  Eyes:    PERRL, conjunctiva/corneas clear, EOM's intact       Lungs:     Clear to auscultation bilaterally, respirations unlabored  Heart:    Regular rate and rhythm  Neurologic:   Awake, alert, oriented x 3. No apparent focal neurological           defect.       Results for orders placed or performed in visit on 06/08/18  POCT glycosylated hemoglobin (Hb A1C)  Result Value Ref Range   Hemoglobin A1C 7.1 (A) 4.0 - 5.6 %   HbA1c POC (<> result, manual entry)  4.0 - 5.6 %  HbA1c, POC (prediabetic range)  5.7 - 6.4 %   HbA1c, POC (controlled diabetic range)  0.0 - 7.0 %   Est. average glucose Bld gHb Est-mCnc 157        Assessment & Plan:     1. Type 2 diabetes mellitus with diabetic neuropathy, with long-term current use of insulin ([place]) Better since addition of Jardiance. Will increase to 25mg  with next refill and anticipate reducing insulin at follow up .  - POCT glycosylated hemoglobin (Hb A1C)  2. Carotid artery disease, unspecified laterality, unspecified type ([place]) Is overdue for follow up imaging. He prefers ultrasound follow up over CT. Will get carotids, if any signs of progression will refer back to vascular.   3. Essential hypertension Well controlled.  Continue current medications.    4. Hyperlipidemia, unspecified hyperlipidemia type Has been getting atorvastatin through [place]. He has follow up soon and will getting lipids checked. Consider increasing to higher intensity statin.        Lelon Huh, MD  [facility]

## 2018-06-08 NOTE — Addendum Note (Signed)
Addended by: Birdie Sons on: 06/08/2018 05:29 PM   Modules accepted: Orders

## 2018-06-08 NOTE — Patient Instructions (Addendum)
   You need to have a cholesterol panel and liver function tests done. If you have these checked at the [place] then send a copy for me to review.

## 2018-06-10 ENCOUNTER — Other Ambulatory Visit: Payer: Self-pay | Admitting: Family Medicine

## 2018-06-18 ENCOUNTER — Ambulatory Visit
Admission: RE | Admit: 2018-06-18 | Discharge: 2018-06-18 | Disposition: A | Discharge status: Home / Self Care | Payer: Medicare HMO | Source: Ambulatory Visit | Attending: Family Medicine | Admitting: Family Medicine

## 2018-06-18 DIAGNOSIS — I779 Disorder of arteries and arterioles, unspecified: Secondary | ICD-10-CM | POA: Insufficient documentation

## 2018-06-18 DIAGNOSIS — Z8679 Personal history of other diseases of the circulatory system: Secondary | ICD-10-CM | POA: Diagnosis not present

## 2018-06-18 DIAGNOSIS — I739 Peripheral vascular disease, unspecified: Secondary | ICD-10-CM

## 2018-06-24 ENCOUNTER — Telehealth: Payer: Self-pay | Admitting: *Deleted

## 2018-06-24 NOTE — Telephone Encounter (Signed)
LMOVM for pt to return call 

## 2018-06-24 NOTE — Telephone Encounter (Signed)
-----   Message from Birdie Sons, MD sent at 06/24/2018  8:12 AM EDT ----- Carotid arteries are open. Need to repeat yearly.

## 2018-06-25 NOTE — Telephone Encounter (Signed)
Pt returned call ° °teri °

## 2018-06-25 NOTE — Telephone Encounter (Signed)
Patient advised and verbally voiced understanding.  

## 2018-06-29 ENCOUNTER — Ambulatory Visit: Payer: Medicare HMO | Admitting: Nurse Practitioner

## 2018-06-29 ENCOUNTER — Ambulatory Visit (INDEPENDENT_AMBULATORY_CARE_PROVIDER_SITE_OTHER): Payer: Medicare HMO | Admitting: *Deleted

## 2018-06-29 DIAGNOSIS — I639 Cerebral infarction, unspecified: Secondary | ICD-10-CM | POA: Diagnosis not present

## 2018-06-29 NOTE — Progress Notes (Signed)
Carelink Summary Report / Loop Recorder 

## 2018-07-01 LAB — CUP PACEART REMOTE DEVICE CHECK
Date Time Interrogation Session: 20190724115652
MDC IDC PG IMPLANT DT: 20170317

## 2018-07-31 ENCOUNTER — Ambulatory Visit (INDEPENDENT_AMBULATORY_CARE_PROVIDER_SITE_OTHER): Payer: Medicare HMO | Admitting: *Deleted

## 2018-07-31 DIAGNOSIS — I639 Cerebral infarction, unspecified: Secondary | ICD-10-CM

## 2018-08-03 NOTE — Progress Notes (Signed)
Carelink Summary Report / Loop Recorder 

## 2018-08-13 NOTE — Progress Notes (Deleted)
[facility]  PATIENT: Sean Hayden DOB: 04-04-1946   REASON FOR VISIT: Follow-up for continued episodes left-sided numbness of the hand , history of stroke, history of endarterectomy HISTORY FROM: Patient    HISTORY OF PRESENT ILLNESS:  UPDATE Dec 29 2017:YY He is accompanied by his daughter-in-law at today's clinical visit, he is taking Keppra 500 mg 2 tablets twice a day, which has helped his spell significantly, still have recurrent spells once or twice each months, he describes as spells of sudden onset of numbness starting left fifth finger, spreading to involving whole left hand, stop at the wrist crease, then have left upper teeth numbness, no loss of consciousness, spell last about 5 minutes, before Keppra, numbness trouble involving the whole left arm, sometimes right hand,  He also snores a lot complains of daytime sleepiness,   UPDATE 09/24/2018CM Sean Hayden, 72 year old male returns for follow-up with history of atrial fibrillation uncontrolled diabetes hyperlipidemia hypertension and history of stroke and carotid endarterectomy past. He has had several episodes of his left hand numbness traveling to the left elbow since last seen. There is no confusion or difficulty using his hand during these episodes may last about 2 minutes. For some reason he has been chewing a baby aspirin when this happens. He is currently on Keppra 500 in the morning thousand at night for suspected partial seizure. He denies any visual loss dysarthria gait abnormality or weakness. EEG in the past has shown mild background slowing. He has had several of these episodes of numbness since last seen. He has also had a larger portion of his time resected but was told it was not cancer. He remains on Xarelto for atrial fibrillation secondary stroke prevention without bruising or bleeding. He is also on Lipitor for hyperlipidemia  Without myalgias. His most recent hemoglobin A1c 9.6 and his  insulin dose was increased to 62 units twice a day. He returns for reevaluation   UPDATE 06/21/2018CM Sean Hayden, 72 year old male returns for follow-up he has a history of atrial fibrillation, diabetes hyperlipidemia hypertension right carotid endarterectomy and stroke in the past. He has had episodes of left hand numbness traveling to the left elbow, no confusion no difficulty using the hand mild difficulty talking symptoms last 2-3 minutes. He was placed on Depakote but had tremor  switched to Keppra 500 twice daily for suspected partial seizure.In between episodes, he denies visual loss, dysarthria, gait abnormality, weakness.MRI of the brain without contrast in November 2017, no acute abnormality, there was extensive supratentorium white matter disease .EEG on December 30 2016 showed mild background slowing, he has had 3 episodes of numbness of the left hand since last seen. He just had an area under his tongue taken out and sent for pathology. He was told yesterday they needed to resect a larger portion. He is still grieving  over the loss of his wife several months ago He returns for reevaluation  UPDATE February 10 2017:YY EEG on December 30 2016 showed mild background slowing, He has one more episode in Jan 2018, he had sudden onset of left hand numbness, uncontrollable left hand shaking while taking Depakote ER 500 mg every night   HISTORY12/5/17  Sean Hayden is a 72 years old right-handed male, accompanied by his son-in-law, seen in refer by his primary care Dr. Lelon Huh for evaluation of stroke, initial evaluation was November 12 2016  He had a past medical history of diabetes, atrial fibrillation, hyperlipidemia, hypertension, is on chronic anticoagulation Xarelto treatments,  he had history of right carotid artery endartectomy, stroke in the past,  He smokes. 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  On Oct 11, 2016, he had acute onset of left hand numbness, traveling to his  left elbow, left shoulder, left upper and lower lip, he had difficulty talking, but denies confusion, difficulty using his left hand, symptoms last about 5 minutes.  He had a very similar episode in March 2017  In between episodes, he denies visual loss, dysarthria, gait abnormality, weakness.  We have personally reviewed MRI of the brain without contrast in November 2017, no acute abnormality, there was extensive supratentorium white matter disease    REVIEW OF SYSTEMS: Full 14 system review of systems performed and notable only for those listed, all others are neg:  Constitutional: Fatigue  Cardiovascular: neg Ear/Nose/Throat: neg  Skin: neg Eyes  Itching Respiratory : neg Gastroitestinal: Urinary urgency  Hematology/Lymphatic: neg  Endocrine: neg Musculoskeletal:neg Allergy/Immunology: neg Neurological: Intermittent numbness of the left hand, seizure  Psychiatric: Depression, anxiety  Sleep Insomnia   ALLERGIES: Allergies  Allergen Reactions  . Actos [Pioglitazone]     UNSPECIFIED REACTION     HOME MEDICATIONS: Outpatient Medications Prior to Visit  Medication Sig Dispense Refill  . acyclovir (ZOVIRAX) 400 MG tablet 1/2 tablet up to 5 times a day for cold sores (Patient taking differently: Take 200 mg by mouth daily as needed. 5 times a day for cold sores) 30 tablet 5  . atorvastatin (LIPITOR) 20 MG tablet Take 20 mg by mouth every evening.     . benzocaine (ORAJEL) 10 % mucosal gel Use as directed 1 application in the mouth or throat as needed for mouth pain.    . Cholecalciferol (VITAMIN D PO) Take 1 tablet by mouth daily.    . Docosanol 10 % CREA Apply topically up to 5 times per day as needed until heals 2 g 3  . empagliflozin (JARDIANCE) 10 MG TABS tablet Take 10 mg by mouth daily. 30 tablet 3  . empagliflozin (JARDIANCE) 25 MG TABS tablet Take 25 mg by mouth daily. 90 tablet 4  . Flaxseed, Linseed, (FLAXSEED OIL) 1000 MG CAPS Take 1,000 mg by mouth 2 (two)  times daily.    . fluticasone (FLONASE) 50 MCG/ACT nasal spray Place 2 sprays into both nostrils daily. 16 g 1  . gabapentin (NEURONTIN) 100 MG capsule Take 100 mg by mouth 3 (three) times daily.    . insulin detemir (LEVEMIR) 100 UNIT/ML injection Inject 0.3 mLs (30 Units total) into the skin 2 (two) times daily. (Patient taking differently: Inject 65 Units into the skin 2 (two) times daily. Taking 65 units hs and 65 units am) 100 mL 1  . levETIRAcetam (KEPPRA) 500 MG tablet 2 in the am and 3 at bedtime 450 tablet 4  . lisinopril (PRINIVIL,ZESTRIL) 2.5 MG tablet Take 1 tablet (2.5 mg total) by mouth daily. 30 tablet 2  . loratadine (CLARITIN) 10 MG tablet Take 10 mg by mouth daily.     . metFORMIN (GLUCOPHAGE) 850 MG tablet Take 850 mg by mouth 3 (three) times daily.     . Misc Natural Products (OSTEO BI-FLEX TRIPLE STRENGTH PO) Take 1 tablet by mouth 2 (two) times daily.     . montelukast (SINGULAIR) 10 MG tablet Take 1 tablet (10 mg total) by mouth daily. For chronic cough 30 tablet 2  . neomycin-polymyxin-hydrocortisone (CORTISPORIN) otic solution Place 4 drops into the left ear 4 (four) times daily. (Patient  taking differently: Place 4 drops into the left ear 4 (four) times daily. ) 10 mL 1  . Omega-3 Fatty Acids (FISH OIL) 1200 MG CAPS Take 1,200 mg by mouth daily.     [name] PARoxetine (PAXIL) 20 MG tablet TAKE ONE TABLET EVERY DAY 30 tablet 12  . rivaroxaban (XARELTO) 20 MG TABS tablet Take 1 tablet (20 mg total) by mouth daily with supper. 10 tablet 0  . zolpidem (AMBIEN) 10 MG tablet 1/2 TO 1 TABLET BY MOUTH AT BEDTIME 90 tablet 1   No facility-administered medications prior to visit.     PAST MEDICAL HISTORY: Past Medical History:  Diagnosis Date  . Aortic stenosis    moderate by 02/2016 echo  . B12 deficiency   . Cancer ([place])    skin cancer - face   . CVA (cerebral vascular accident) ([place])    02/2016 no residual  . Diabetes mellitus without complication (HCC)    Type II  .  Diverticulosis   . Dyspnea    with activity  . Gout   . Gouty arthropathy   . Hemorrhoids   . History of kidney stones   . History of right-sided carotid endarterectomy 04/17/12  . Hyperplastic colon polyp   . Hypertension   . MRSA infection   . Neuropathy   . OSA (obstructive sleep apnea)   . PAF (paroxysmal atrial fibrillation) ([place])   . Personal history of urinary calculi 05/25/2015  . Pneumonia    1990's  . Seizures ([place])     PAST SURGICAL HISTORY: Past Surgical History:  Procedure Laterality Date  . Arm fracture Left   . CAROTID ARTERY ANGIOPLASTY Right 04/17/2012   Dr. Delana Meyer, [facility]  . CAROTID ENDARTERECTOMY Right 04/17/2012   Dr. Delana Meyer, [facility]  . CHOLECYSTECTOMY  2005   lap  . COLONOSCOPY    . CYST EXCISION     "between legs"  . EP IMPLANTABLE DEVICE N/A 02/23/2016   Procedure: Loop Recorder Insertion;  Surgeon: Thompson Grayer, MD;  Location: [facility];  Service: Cardiovascular;  Laterality: N/A;  . HEMORRHOID SURGERY  2009  . INCISION AND DRAINAGE WOUND WITH NERVE AND ARTERY REPAIR Left 10/04/2017   Procedure: LEFT HAND WOUND EXPLORATION repair as necessary;  Surgeon: Iran Planas, MD;  Location: [facility];  Service: Orthopedics;  Laterality: Left;  . TEE WITHOUT CARDIOVERSION N/A 02/23/2016   Procedure: TRANSESOPHAGEAL ECHOCARDIOGRAM (TEE);  Surgeon: Pixie Casino, MD;  Location: [facility];  Service: Cardiovascular;  Laterality: N/A;  ALSO GETTING A LOOP    FAMILY HISTORY: Family History  Problem Relation Age of Onset  . Hodgkin's lymphoma Father   . Diabetes Mother     SOCIAL HISTORY: Social History   Socioeconomic History  . Marital status: Widowed    Spouse name: Not on file  . Number of children: 2  . Years of education: HS Grad  . Highest education level: 12th grade  Occupational History  . Occupation: Retired  Scientific laboratory technician  . Financial resource strain: Not hard at all  . Food insecurity:    Worry: Never true    Inability: Never true    . Transportation needs:    Medical: No    Non-medical: No  Tobacco Use  . Smoking status: Former Smoker    Years: 45.00    Types: Cigars    Last attempt to quit: 04/08/2012    Years since quitting: 6.3  . Smokeless tobacco: Never Used  . Tobacco comment: cigarettes and cigars  Substance and Sexual Activity  . Alcohol use: No    Alcohol/week: 0.0 standard drinks    Comment: quit 2013  . Drug use: No  . Sexual activity: Not on file  Lifestyle  . Physical activity:    Days per week: Not on file    Minutes per session: Not on file  . Stress: Rather much  Relationships  . Social connections:    Talks on phone: Not on file    Gets together: Not on file    Attends religious service: Not on file    Active member of club or organization: Not on file    Attends meetings of clubs or organizations: Not on file    Relationship status: Not on file  . Intimate partner violence:    Fear of current or ex partner: Not on file    Emotionally abused: Not on file    Physically abused: Not on file    Forced sexual activity: Not on file  Other Topics Concern  . Not on file  Social History Narrative   Lives at home with his granddaughter.   Right-handed.   No caffeine use.         PHYSICAL EXAM  There were no vitals filed for this visit. There is no height or weight on file to calculate BMI.  Generalized: Well developed, obese male  no acute distress  Head: normocephalic and atraumatic,. Oropharynx benign  Neck: Supple, no carotid bruits  Cardiac: Regular rate rhythm, no murmur  Musculoskeletal: No deformity   Neurological examination   Mentation: Alert oriented to time, place, history taking. Attention span and concentration appropriate. Recent and remote memory intact.  Follows all commands speech and language fluent.   Cranial nerve II-XII: Pupils were equal round reactive to light extraocular movements were full, visual field were full on confrontational test. Facial sensation  and strength were normal. hearing was intact to finger rubbing bilaterally. Uvula tongue midline. head turning and shoulder shrug were normal and symmetric.Tongue protrusion into cheek strength was normal. Motor: normal bulk and tone, full strength in the BUE, BLE, fine finger movements normal, no pronator drift. No focal weakness Sensory: normal and symmetric to light touch, pinprick, and  Vibration, in the upper and lower extremities  Coordination: finger-nose-finger, heel-to-shin bilaterally, no dysmetria Reflexes: Symmetric upper and lower plantar responses were flexor bilaterally. Gait and Station: Rising up from seated position with push off, cautious mildly unsteady gait Tandem gait is unsteady. No assistive device  DIAGNOSTIC DATA (LABS, IMAGING, TESTING) - I reviewed patient records, labs, notes, testing and imaging myself where available.  Lab Results  Component Value Date   WBC 5.8 10/04/2017   HGB 10.0 (L) 10/04/2017   HCT 30.1 (L) 10/04/2017   MCV 92.0 10/04/2017   PLT 196 10/04/2017      Component Value Date/Time   NA 136 09/29/2017 2204   NA 138 03/03/2017 1019   K 4.7 09/29/2017 2204   K 4.6 11/23/2015   K 5.1 04/18/2012 0412   CL 99 (L) 09/29/2017 2204   CL 107 04/18/2012 0412   CO2 24 09/23/2017 1953   CO2 25 04/18/2012 0412   GLUCOSE 282 (H) 09/29/2017 2204   GLUCOSE 188 (H) 04/18/2012 0412   BUN 11 09/29/2017 2204   BUN 12 01/23/2017   BUN 13 04/18/2012 0412   CREATININE 0.70 09/29/2017 2204   CREATININE 1.07 04/18/2012 0412   CALCIUM 9.1 09/23/2017 1953   CALCIUM 8.7 03/03/2017 1019   PROT 7.5  09/23/2017 1953   ALBUMIN 3.9 09/23/2017 1953   AST 23 09/23/2017 1953   ALT 20 09/23/2017 1953   ALKPHOS 76 09/23/2017 1953   BILITOT 0.4 09/23/2017 1953   GFRNONAA >60 09/23/2017 1953   GFRNONAA >60 04/18/2012 0412   GFRAA >60 09/23/2017 1953   GFRAA 118.2 03/03/2017 1019   Lab Results  Component Value Date   CHOL 113 02/21/2016   HDL 34 (L) 02/21/2016     LDLCALC 37 02/21/2016   TRIG 209 (H) 02/21/2016   CHOLHDL 3.3 02/21/2016   Lab Results  Component Value Date   HGBA1C 7.1 (A) 06/08/2018   Lab Results  Component Value Date   VITAMINB12 1,146 (H) 02/28/2016   Lab Results  Component Value Date   TSH 1.82 11/23/2015      ASSESSMENT AND PLAN  72 y.o. year old male  has a past medical history of B12 deficiency; CVA (cerebral vascular accident) ([place]); Diabetes mellitus without complication ([place]); History of right-sided carotid endarterectomy (04/17/12);  Hypertension; Kidney stones;   Neuropathy; OSA (obstructive sleep apnea); And recurrent left-sided paresthesias history of extensive white matter disease. Most consistent with partial seizure EEG showed mild background slowing.   Increase Keppra to 1000mg  am and 1000mg  pm will refill Continue Xarelto for atrial fibrillation  Continue Lipitor for hyperlipidemia Keep blood pressure less than 374 systolic, today's reading 121/71 Moderate exercise, healthy diet Hemoglobin A1c less than 6.5 most recent 9.6, continue insulin Follow-up in 4 months next with Dr. Krista Blue I spent 25 minutes in total face to face time with the patient more than 50% of which was spent counseling and coordination of care, reviewing test results, reviewing chart reviewing medications and discussing and reviewing the diagnosis of partial seizure risk factor modification for stroke /TIAand further treatment options. , Sean Hayden, GNP, [facility], APRN 72 y.o. year old male  Recurrent spells of traveling left hand numbness,             Suggestive of partial seizure,             He did respond significant improvement with Keppra, still has recurrent spells taking Keppra 500 mg 2 tablets twice a day, will increase nighttime dose to 3 tablets.             MRI of the brain showed sensitive left matter disease, more on the left hemisphere, History suggestive of obstructive sleep apnea             He also has multiple  vascular risk factors, I have suggested sleep study, he wants to hold off,             Emphasized the importance of compliant with his medications, moderate exercise  [facility] [address] [place], [place] [address] [phone]

## 2018-08-14 ENCOUNTER — Emergency Department
Admission: EM | Admit: 2018-08-14 | Discharge: 2018-08-14 | Disposition: A | Discharge status: Home / Self Care | Payer: Medicare HMO | Attending: Emergency Medicine | Admitting: Emergency Medicine

## 2018-08-14 DIAGNOSIS — Y999 Unspecified external cause status: Secondary | ICD-10-CM | POA: Diagnosis not present

## 2018-08-14 DIAGNOSIS — S30810A Abrasion of lower back and pelvis, initial encounter: Secondary | ICD-10-CM | POA: Diagnosis not present

## 2018-08-14 DIAGNOSIS — W07XXXA Fall from chair, initial encounter: Secondary | ICD-10-CM | POA: Diagnosis not present

## 2018-08-14 DIAGNOSIS — E119 Type 2 diabetes mellitus without complications: Secondary | ICD-10-CM | POA: Diagnosis not present

## 2018-08-14 DIAGNOSIS — I10 Essential (primary) hypertension: Secondary | ICD-10-CM | POA: Diagnosis not present

## 2018-08-14 DIAGNOSIS — M549 Dorsalgia, unspecified: Secondary | ICD-10-CM | POA: Diagnosis not present

## 2018-08-14 DIAGNOSIS — Z85828 Personal history of other malignant neoplasm of skin: Secondary | ICD-10-CM | POA: Diagnosis not present

## 2018-08-14 DIAGNOSIS — Y9389 Activity, other specified: Secondary | ICD-10-CM | POA: Diagnosis not present

## 2018-08-14 DIAGNOSIS — Z87891 Personal history of nicotine dependence: Secondary | ICD-10-CM | POA: Diagnosis not present

## 2018-08-14 DIAGNOSIS — Y929 Unspecified place or not applicable: Secondary | ICD-10-CM | POA: Diagnosis not present

## 2018-08-14 DIAGNOSIS — Z79899 Other long term (current) drug therapy: Secondary | ICD-10-CM | POA: Insufficient documentation

## 2018-08-14 DIAGNOSIS — Z794 Long term (current) use of insulin: Secondary | ICD-10-CM | POA: Insufficient documentation

## 2018-08-14 DIAGNOSIS — T148XXA Other injury of unspecified body region, initial encounter: Secondary | ICD-10-CM

## 2018-08-14 DIAGNOSIS — S20412A Abrasion of left back wall of thorax, initial encounter: Secondary | ICD-10-CM | POA: Diagnosis not present

## 2018-08-14 DIAGNOSIS — Z7901 Long term (current) use of anticoagulants: Secondary | ICD-10-CM | POA: Diagnosis not present

## 2018-08-14 LAB — CUP PACEART REMOTE DEVICE CHECK
Date Time Interrogation Session: 20190906135002
Implantable Pulse Generator Implant Date: 20170317

## 2018-08-14 LAB — GLUCOSE, CAPILLARY: Glucose-Capillary: 179 mg/dL — ABNORMAL HIGH (ref 70–99)

## 2018-08-14 NOTE — ED Provider Notes (Signed)
[facility] Emergency Department Provider Note  ____________________________________________   First MD Initiated Contact with Patient 08/14/18 1457     (approximate)  I have reviewed the triage vital signs and the nursing notes.   HISTORY  Chief Complaint Abrasion  HPI Sean Hayden is a 72 y.o. male with a history of atrial fibrillation on Xarelto who was presented to the emergency department today with persistent bleeding from an abrasion on his back.  He states that he was duck hunting on Tuesday when he fell backwards in a chair.  He says that he thinks he had a stump and sustained an impact and several abrasions.  He says that he has had persistent bleeding from an abrasion in the middle of his back.  He says that it is been bleeding through the bandages and onto his shirt.  He also says that he has pain to the bilateral flanks with movement but not at rest.  He says that this feels like a tightness.  Denies any difficulty with breathing or pain with deep breathing.  Does not report any head or neck trauma or pain to the head of the neck.  Past Medical History:  Diagnosis Date  . Aortic stenosis    moderate by 02/2016 echo  . B12 deficiency   . Cancer ([place])    skin cancer - face   . CVA (cerebral vascular accident) ([place])    02/2016 no residual  . Diabetes mellitus without complication (HCC)    Type II  . Diverticulosis   . Dyspnea    with activity  . Gout   . Gouty arthropathy   . Hemorrhoids   . History of kidney stones   . History of right-sided carotid endarterectomy 04/17/12  . Hyperplastic colon polyp   . Hypertension   . MRSA infection   . Neuropathy   . OSA (obstructive sleep apnea)   . PAF (paroxysmal atrial fibrillation) ([place])   . Personal history of urinary calculi 05/25/2015  . Pneumonia    1990's  . Seizures [facility])     Patient Active Problem List   Diagnosis Date Noted  . Cerebrovascular disease 12/29/2017  . Partial seizure  ([place]) 02/10/2017  . History of MRSA infection 01/31/2017  . Situational stress 04/16/2016  . Paroxysmal atrial fibrillation ([place]) 03/26/2016  . Vitamin D deficiency 02/28/2016  . Aortic valve stenosis, congenital   . History of CVA (cerebrovascular accident) without residual deficits 02/22/2016  . Diabetes mellitus with neuropathy ([place]) 02/21/2016  . Hyperlipidemia 02/21/2016  . Hypertension 02/21/2016  . History of right-sided carotid endarterectomy 02/21/2016  . Allergic rhinitis 02/21/2016  . B12 deficiency 12/14/2015  . Arthritis 05/25/2015  . Diverticulosis of colon 05/25/2015  . Edema 05/25/2015  . Obstructive sleep apnea of adult 05/25/2015  . Psoriasis 05/25/2015  . RAD (reactive airway disease) 05/25/2015  . TIA (transient ischemic attack) 05/25/2015  . Carotid artery disease ([place]) 03/10/2012  . Obesity 01/18/2008  . Insomnia 12/09/2006  . Gouty arthritis 02/07/1999    Past Surgical History:  Procedure Laterality Date  . Arm fracture Left   . CAROTID ARTERY ANGIOPLASTY Right 04/17/2012   Dr. Delana Meyer, [facility]  . CAROTID ENDARTERECTOMY Right 04/17/2012   Dr. Delana Meyer, [facility]  . CHOLECYSTECTOMY  2005   lap  . COLONOSCOPY    . CYST EXCISION     "between legs"  . EP IMPLANTABLE DEVICE N/A 02/23/2016   Procedure: Loop Recorder Insertion;  Surgeon: Thompson Grayer, MD;  Location: [facility]  INVASIVE CV LAB;  Service: Cardiovascular;  Laterality: N/A;  . HEMORRHOID SURGERY  2009  . INCISION AND DRAINAGE WOUND WITH NERVE AND ARTERY REPAIR Left 10/04/2017   Procedure: LEFT HAND WOUND EXPLORATION repair as necessary;  Surgeon: Iran Planas, MD;  Location: [facility];  Service: Orthopedics;  Laterality: Left;  . TEE WITHOUT CARDIOVERSION N/A 02/23/2016   Procedure: TRANSESOPHAGEAL ECHOCARDIOGRAM (TEE);  Surgeon: Pixie Casino, MD;  Location: [facility] ENDOSCOPY;  Service: Cardiovascular;  Laterality: N/A;  ALSO GETTING A LOOP    Prior to Admission medications   Medication Sig Start Date End Date  Taking? Authorizing Provider  acyclovir (ZOVIRAX) 400 MG tablet 1/2 tablet up to 5 times a day for cold sores Patient taking differently: Take 200 mg by mouth daily as needed. 5 times a day for cold sores 08/01/17  Yes Birdie Sons, MD  atorvastatin (LIPITOR) 20 MG tablet Take 20 mg by mouth every evening.  11/01/14  Yes [provider]  benzocaine (ORAJEL) 10 % mucosal gel Use as directed 1 application in the mouth or throat as needed for mouth pain.   Yes [provider]  Cholecalciferol (VITAMIN D PO) Take 1 tablet by mouth daily.   Yes [provider]  Docosanol 10 % CREA Apply topically up to 5 times per day as needed until heals 12/03/16  Yes Burnette, Clearnce Sorrel, PA-C  empagliflozin (JARDIANCE) 25 MG TABS tablet Take 25 mg by mouth daily. 06/08/18  Yes Fisher, Kirstie Peri, MD  Flaxseed, Linseed, (FLAXSEED OIL) 1000 MG CAPS Take 1,000 mg by mouth 2 (two) times daily.   Yes [provider]  fluticasone (FLONASE) 50 MCG/ACT nasal spray Place 2 sprays into both nostrils daily. 12/06/16  Yes Birdie Sons, MD  gabapentin (NEURONTIN) 100 MG capsule Take 100 mg by mouth 2 (two) times daily.    Yes [provider]  insulin detemir (LEVEMIR) 100 UNIT/ML injection Inject 0.3 mLs (30 Units total) into the skin 2 (two) times daily. Patient taking differently: Inject 60-70 Units into the skin 2 (two) times daily. Taking 60 units hs and 70 units am 10/11/16  Yes [name] Lighter, MD  levETIRAcetam (KEPPRA) 500 MG tablet 2 in the am and 3 at bedtime Patient taking differently: Take 1,000-1,500 mg by mouth 2 (two) times daily. 2 in the am and 3 at bedtime 12/29/17  Yes Marcial Pacas, MD  lisinopril (PRINIVIL,ZESTRIL) 2.5 MG tablet Take 1 tablet (2.5 mg total) by mouth daily. 10/11/16  Yes [name] Lighter, MD  loratadine (CLARITIN) 10 MG tablet Take 10 mg by mouth daily.  03/24/12  Yes [provider]  metFORMIN (GLUCOPHAGE) 850 MG tablet Take 850 mg by  mouth 2 (two) times daily with a meal.    Yes [provider]  Misc Natural Products (OSTEO BI-FLEX TRIPLE STRENGTH PO) Take 1 tablet by mouth 2 (two) times daily.    Yes [provider]  neomycin-polymyxin-hydrocortisone (CORTISPORIN) otic solution Place 4 drops into the left ear 4 (four) times daily. Patient taking differently: Place 4 drops into the left ear 4 (four) times daily.  04/10/16  Yes Chauvin, Herbie Baltimore, PA  Omega-3 Fatty Acids (FISH OIL) 1200 MG CAPS Take 1,200 mg by mouth 2 (two) times daily.    Yes [provider]  rivaroxaban (XARELTO) 20 MG TABS tablet Take 1 tablet (20 mg total) by mouth daily with supper. 10/24/16  Yes Birdie Sons, MD  zolpidem (AMBIEN) 10 MG tablet 1/2 TO 1 TABLET BY MOUTH  AT BEDTIME Patient taking differently: Take 5-10 mg by mouth at bedtime. 1/2 TO 1 TABLET BY MOUTH AT BEDTIME 06/01/18  Yes Birdie Sons, MD  empagliflozin (JARDIANCE) 10 MG TABS tablet Take 10 mg by mouth daily. Patient not taking: Reported on 08/14/2018 02/02/18   Birdie Sons, MD  montelukast (SINGULAIR) 10 MG tablet Take 1 tablet (10 mg total) by mouth daily. For chronic cough Patient not taking: Reported on 08/14/2018 11/10/17   Birdie Sons, MD  PARoxetine (PAXIL) 20 MG tablet TAKE ONE TABLET EVERY DAY Patient not taking: Reported on 08/14/2018 11/24/17   Birdie Sons, MD    Allergies Actos [pioglitazone]  Family History  Problem Relation Age of Onset  . Hodgkin's lymphoma Father   . Diabetes Mother     Social History Social History   Tobacco Use  . Smoking status: Former Smoker    Years: 45.00    Types: Cigars    Last attempt to quit: 04/08/2012    Years since quitting: 6.3  . Smokeless tobacco: Never Used  . Tobacco comment: cigarettes and cigars  Substance Use Topics  . Alcohol use: No    Alcohol/week: 0.0 standard drinks    Comment: quit 2013  . Drug use: No    Review of Systems  Constitutional: No fever/chills Eyes: No  visual changes. ENT: No sore throat. Cardiovascular: Denies chest pain. Respiratory: Denies shortness of breath. Gastrointestinal: No abdominal pain.  No nausea, no vomiting.  No diarrhea.  No constipation. Genitourinary: Negative for dysuria. Musculoskeletal: As above Skin: Negative for rash. Neurological: Negative for headaches, focal weakness or numbness.   ____________________________________________   PHYSICAL EXAM:  VITAL SIGNS: ED Triage Vitals  Enc Vitals Group     BP 08/14/18 1122 126/65     Pulse Rate 08/14/18 1122 67     Resp 08/14/18 1122 18     Temp 08/14/18 1122 97.6 F (36.4 C)     Temp Source 08/14/18 1122 Oral     SpO2 08/14/18 1122 97 %     Weight 08/14/18 1123 250 lb (113.4 kg)     Height 08/14/18 1123 5\' 7"  (1.702 m)     Head Circumference --      Peak Flow --      Pain Score 08/14/18 1122 0     Pain Loc --      Pain Edu? --      Excl. in [place]? --     Constitutional: Alert and oriented. Well appearing and in no acute distress. Eyes: Conjunctivae are normal.  Head: Atraumatic. Nose: No congestion/rhinnorhea. Mouth/Throat: Mucous membranes are moist.  Neck: No stridor.   Cardiovascular: Normal rate, regular rhythm. Grossly normal heart sounds.   Respiratory: Normal respiratory effort.  No retractions. Lungs CTAB. Gastrointestinal: Soft and nontender. No distention. No CVA tenderness. Musculoskeletal: No lower extremity tenderness nor edema.  No joint effusions. Patient without any midline thoracic nor lumbar tenderness to palpation.  There is no lateralizing tenderness as well.  No deformity or step-off.  There is an abrasion in approximately T12-L1 at the midline with a small amount of dead skin at the inferior left lateral border.  The abrasion is approximately 2 x 3 cm and superficial.  However, when I take off the Band-Aid and wiped with gauze there is a small amount of bleeding.  I was able to rub the dead skin off with just light wiping with the  gauze.  There is no erythema at the wound margins.  No exudate.  No induration.  There are 2 smaller abrasions approximately 1 cm and very superficial without any bleeding just superior to this lesion.  Neurologic:  Normal speech and language. No gross focal neurologic deficits are appreciated. Skin:  Skin is warm, dry and intact. No rash noted. Psychiatric: Mood and affect are normal. Speech and behavior are normal.  ____________________________________________   LABS (all labs ordered are listed, but only abnormal results are displayed)  Labs Reviewed  GLUCOSE, CAPILLARY - Abnormal; Notable for the following components:      Result Value   Glucose-Capillary 179 (*)    All other components within normal limits   ____________________________________________  EKG   ____________________________________________  RADIOLOGY   ____________________________________________   PROCEDURES  Procedure(s) performed:   Procedures  Critical Care performed:   ____________________________________________   INITIAL IMPRESSION / ASSESSMENT AND PLAN / ED COURSE  Pertinent labs & imaging results that were available during my care of the patient were reviewed by me and considered in my medical decision making (see chart for details).  DDX: Abrasion, muscle strain, contusion, coagulopathy with Eliquis. As part of my medical decision making, I reviewed the following data within the electronic MEDICAL RECORD NUMBER Notes from prior outpatient visits  ----------------------------------------- 4:01 PM on 08/14/2018 -----------------------------------------  I placed to Surgicel dressings and a Band-Aid on top of the oozing abrasion/skin tear.  After period of about 10 or 15 minutes I checked back on the patient and there was no blood that had gone through the bandages.  No bony tenderness to the entire back.  Pain only with movement.  Likely muscle strain/contusion.  I do not believe the patient  has a serious internal injury as the injury occurred this past Tuesday and he has reassuring vital signs.  He is taking Tylenol ibuprofen as well as a topical muscle cream and will continue this.  We also discussed stretching.  I gave him several Surgicel to use at home as well.  Will be discharged at this time.  Also recommended a topical antibacterial ointment.  However, this for prophylaxis and the wound does not appear infected. ____________________________________________   FINAL CLINICAL IMPRESSION(S) / ED DIAGNOSES  Abrasion.  Back pain.   NEW MEDICATIONS STARTED DURING THIS VISIT:  New Prescriptions   No medications on file     Note:  This document was prepared using Dragon voice recognition software and may include unintentional dictation errors.     Orbie Pyo, MD 08/14/18 [id number]

## 2018-08-14 NOTE — ED Notes (Signed)
Patient denies weakness, denies chest pain.  Patient states he has been eating "out a lot" and may have had a higher than normal intake of sodium.  Patient states he is anxious about being in the hospital and would rather not have his blood drawn if he doesn't have to.  Patient states he has been sleeping in a recliner for years.  Patient reports back pain when changing positions but not while walking or sitting.  Denies numbness/tingling to arms and legs.

## 2018-08-14 NOTE — ED Triage Notes (Addendum)
Patient presents to the ED with abrasions to his back after he fell backwards out of a chair while dove hunting on Tuesday.  Patient states he is a diabetic and is on xarelto and the wounds on his back are not healing well, they keep bleeding through any bandages he is putting on them.  Patient is in no obvious distress at this time.  Patient states he forgot to take his insulin shot this morning because he was at a different house than usual.  Patient also reports swelling to his lower legs since he fell.  Patient states he has had similar swelling intermittently in the past.

## 2018-08-17 ENCOUNTER — Encounter: Payer: Self-pay | Admitting: Nurse Practitioner

## 2018-08-17 ENCOUNTER — Telehealth: Payer: Self-pay | Admitting: *Deleted

## 2018-08-17 ENCOUNTER — Ambulatory Visit: Payer: Medicare HMO | Admitting: Nurse Practitioner

## 2018-08-17 NOTE — Telephone Encounter (Signed)
Patient was no show for follow up with NP today.  

## 2018-08-19 ENCOUNTER — Encounter: Payer: Self-pay | Admitting: Family Medicine

## 2018-08-19 ENCOUNTER — Ambulatory Visit (INDEPENDENT_AMBULATORY_CARE_PROVIDER_SITE_OTHER): Payer: Medicare HMO | Admitting: Family Medicine

## 2018-08-19 VITALS — BP 116/65 | HR 67 | Temp 97.6°F | Resp 16 | Wt 251.0 lb

## 2018-08-19 DIAGNOSIS — B001 Herpesviral vesicular dermatitis: Secondary | ICD-10-CM

## 2018-08-19 DIAGNOSIS — Z23 Encounter for immunization: Secondary | ICD-10-CM

## 2018-08-19 DIAGNOSIS — T148XXA Other injury of unspecified body region, initial encounter: Secondary | ICD-10-CM

## 2018-08-19 MED ORDER — ACYCLOVIR 400 MG PO TABS: ORAL_TABLET | ORAL | 5 refills | Status: DC

## 2018-08-19 NOTE — Patient Instructions (Addendum)
Hold Xarelto for 3 days.  Stop fish oil for 1-2 weeks, until abrasion is completely scabbed over.

## 2018-08-19 NOTE — Progress Notes (Signed)
Patient: Sean Hayden Male    DOB: 21-May-1946   72 y.o.   MRN: [id number] Visit Date: 08/19/2018  Today's Provider: Lelon Huh, MD   Chief Complaint  Patient presents with  . Follow-up   Subjective:    HPI  Follow up ER visit  Patient was seen in ER for Abrasion on 08/14/2018. He states he fell backwords while hunting and landed on his back onto a tree stump. Treatment for this included applying Surgicel and advising patient to continue usage at home. He reports good compliance with treatment. He reports this condition is Unchanged. Patient states he is still having bleeding from the wound, but is not painful.   ------------------------------------------------------------------------------------      Allergies  Allergen Reactions  . Actos [Pioglitazone]     UNSPECIFIED REACTION      Current Outpatient Medications:  .  acyclovir (ZOVIRAX) 400 MG tablet, 1/2 tablet up to 5 times a day for cold sores (Patient taking differently: Take 200 mg by mouth daily as needed. 5 times a day for cold sores), Disp: 30 tablet, Rfl: 5 .  atorvastatin (LIPITOR) 20 MG tablet, Take 20 mg by mouth every evening. , Disp: , Rfl:  .  benzocaine (ORAJEL) 10 % mucosal gel, Use as directed 1 application in the mouth or throat as needed for mouth pain., Disp: , Rfl:  .  Cholecalciferol (VITAMIN D PO), Take 1 tablet by mouth daily., Disp: , Rfl:  .  Docosanol 10 % CREA, Apply topically up to 5 times per day as needed until heals, Disp: 2 g, Rfl: 3 .  empagliflozin (JARDIANCE) 25 MG TABS tablet, Take 25 mg by mouth daily., Disp: 90 tablet, Rfl: 4 .  Flaxseed, Linseed, (FLAXSEED OIL) 1000 MG CAPS, Take 1,000 mg by mouth 2 (two) times daily., Disp: , Rfl:  .  fluticasone (FLONASE) 50 MCG/ACT nasal spray, Place 2 sprays into both nostrils daily., Disp: 16 g, Rfl: 1 .  gabapentin (NEURONTIN) 100 MG capsule, Take 100 mg by mouth 2 (two) times daily. , Disp: , Rfl:  .  insulin detemir (LEVEMIR)  100 UNIT/ML injection, Inject 0.3 mLs (30 Units total) into the skin 2 (two) times daily. (Patient taking differently: Inject 60-70 Units into the skin 2 (two) times daily. Taking 60 units hs and 70 units am), Disp: 100 mL, Rfl: 1 .  levETIRAcetam (KEPPRA) 500 MG tablet, 2 in the am and 3 at bedtime (Patient taking differently: Take 1,000-1,500 mg by mouth 2 (two) times daily. 2 in the am and 3 at bedtime), Disp: 450 tablet, Rfl: 4 .  lisinopril (PRINIVIL,ZESTRIL) 2.5 MG tablet, Take 1 tablet (2.5 mg total) by mouth daily., Disp: 30 tablet, Rfl: 2 .  loratadine (CLARITIN) 10 MG tablet, Take 10 mg by mouth daily. , Disp: , Rfl:  .  metFORMIN (GLUCOPHAGE) 850 MG tablet, Take 850 mg by mouth 2 (two) times daily with a meal. , Disp: , Rfl:  .  Misc Natural Products (OSTEO BI-FLEX TRIPLE STRENGTH PO), Take 1 tablet by mouth 2 (two) times daily. , Disp: , Rfl:  .  montelukast (SINGULAIR) 10 MG tablet, Take 1 tablet (10 mg total) by mouth daily. For chronic cough, Disp: 30 tablet, Rfl: 2 .  neomycin-polymyxin-hydrocortisone (CORTISPORIN) otic solution, Place 4 drops into the left ear 4 (four) times daily. (Patient taking differently: Place 4 drops into the left ear 4 (four) times daily. ), Disp: 10 mL, Rfl: 1 .  Omega-3 Fatty  Acids (FISH OIL) 1200 MG CAPS, Take 1,200 mg by mouth 2 (two) times daily. , Disp: , Rfl:  .  PARoxetine (PAXIL) 20 MG tablet, TAKE ONE TABLET EVERY DAY, Disp: 30 tablet, Rfl: 12 .  rivaroxaban (XARELTO) 20 MG TABS tablet, Take 1 tablet (20 mg total) by mouth daily with supper., Disp: 10 tablet, Rfl: 0 .  zolpidem (AMBIEN) 10 MG tablet, 1/2 TO 1 TABLET BY MOUTH AT BEDTIME (Patient taking differently: Take 5-10 mg by mouth at bedtime. 1/2 TO 1 TABLET BY MOUTH AT BEDTIME), Disp: 90 tablet, Rfl: 1 .  empagliflozin (JARDIANCE) 10 MG TABS tablet, Take 10 mg by mouth daily. (Patient not taking: Reported on 08/19/2018), Disp: 30 tablet, Rfl: 3  Review of Systems  Constitutional: Negative for  appetite change, chills and fever.  Respiratory: Negative for chest tightness, shortness of breath and wheezing.   Cardiovascular: Negative for chest pain and palpitations.  Gastrointestinal: Negative for abdominal pain, nausea and vomiting.  Skin: Positive for wound.  Hematological: Bruises/bleeds easily.    Social History   Tobacco Use  . Smoking status: Former Smoker    Years: 45.00    Types: Cigars    Last attempt to quit: 04/08/2012    Years since quitting: 6.3  . Smokeless tobacco: Never Used  . Tobacco comment: cigarettes and cigars  Substance Use Topics  . Alcohol use: No    Alcohol/week: 0.0 standard drinks    Comment: quit 2013   Objective:   BP 116/65   Pulse 67   Temp 97.6 F (36.4 C) (Oral)   Resp 16   Wt 251 lb (113.9 kg)   SpO2 96%   BMI 39.31 kg/m  Vitals:   08/19/18 1046  BP: 116/65  Pulse: 67  Resp: 16  Temp: 97.6 F (36.4 C)  TempSrc: Oral  SpO2: 96%  Weight: 251 lb (113.9 kg)     Physical Exam  General appearance: alert, well developed, well nourished, cooperative and in no distress Head: Normocephalic, without obvious abnormality, atraumatic Respiratory: Respirations even and unlabored, normal respiratory rate Extremities: No gross deformities Skin: about 3x2cm abrasions mid back oozing small amount of blood. No purulent discharge. No surrounding erythema.      Assessment & Plan:     1. Abrasion No sign of infection, but is not scabbing due to delayed blood clotting. He is to stop fish oil and put xarelto on hold for 2-3 days to allow wound to properly coagulate.  2. Recurrent cold sores refill- acyclovir (ZOVIRAX) 400 MG tablet; 1/2 tablet up to 5 times a day for cold sores  Dispense: 30 tablet; Refill: 5  3. Need for influenza vaccination  - Flu vaccine HIGH DOSE PF (Fluzone High dose)       Lelon Huh, MD  [facility]

## 2018-08-27 ENCOUNTER — Encounter: Payer: Self-pay | Admitting: Family Medicine

## 2018-08-27 ENCOUNTER — Ambulatory Visit (INDEPENDENT_AMBULATORY_CARE_PROVIDER_SITE_OTHER): Payer: Medicare HMO | Admitting: Family Medicine

## 2018-08-27 VITALS — BP 119/70 | HR 79 | Temp 98.0°F | Resp 16 | Ht 65.0 in | Wt 247.0 lb

## 2018-08-27 DIAGNOSIS — J0110 Acute frontal sinusitis, unspecified: Secondary | ICD-10-CM | POA: Diagnosis not present

## 2018-08-27 DIAGNOSIS — M545 Low back pain, unspecified: Secondary | ICD-10-CM

## 2018-08-27 DIAGNOSIS — R059 Cough, unspecified: Secondary | ICD-10-CM

## 2018-08-27 DIAGNOSIS — J40 Bronchitis, not specified as acute or chronic: Secondary | ICD-10-CM

## 2018-08-27 DIAGNOSIS — R05 Cough: Secondary | ICD-10-CM

## 2018-08-27 MED ORDER — CYCLOBENZAPRINE HCL 5 MG PO TABS: 5.0000 mg | ORAL_TABLET | Freq: Three times a day (TID) | ORAL | 0 refills | Status: AC | PRN

## 2018-08-27 MED ORDER — LEVOFLOXACIN 750 MG PO TABS: 750.0000 mg | ORAL_TABLET | Freq: Every day | ORAL | 0 refills | Status: DC

## 2018-08-27 NOTE — Progress Notes (Signed)
Patient: Sean Hayden Male    DOB: 1946/05/21   72 y.o.   MRN: [id number] Visit Date: 08/27/2018  Today's Provider: Lelon Huh, MD   Chief Complaint  Patient presents with  . Cough   Subjective:    Cough  This is a new problem. The current episode started in the past 7 days (6 days). The cough is productive of sputum. Associated symptoms include myalgias, nasal congestion, rhinorrhea, a sore throat, sweats and wheezing. Pertinent negatives include no chest pain, chills, ear congestion, ear pain, fever, headaches, heartburn, hemoptysis, postnasal drip, rash, shortness of breath or weight loss. Nothing aggravates the symptoms. Treatments tried: Zyrtec. His past medical history is significant for bronchitis.  Patient has had cough and chest congestion for 6 days. Patient states cough is productive of green muscous. Patient also has symptoms of sore throat, muscle aches, wheezing, runny nose, sinus pressure and back pian. Patient has been taking allergy medication with no relief.   He reports pain goes across lower back, does not radiate. No known injury, but thinks it may be from coughing.     Allergies  Allergen Reactions  . Actos [Pioglitazone]     UNSPECIFIED REACTION      Current Outpatient Medications:  .  acyclovir (ZOVIRAX) 400 MG tablet, 1/2 tablet up to 5 times a day for cold sores, Disp: 30 tablet, Rfl: 5 .  atorvastatin (LIPITOR) 20 MG tablet, Take 20 mg by mouth every evening. , Disp: , Rfl:  .  benzocaine (ORAJEL) 10 % mucosal gel, Use as directed 1 application in the mouth or throat as needed for mouth pain., Disp: , Rfl:  .  Cholecalciferol (VITAMIN D PO), Take 1 tablet by mouth daily., Disp: , Rfl:  .  Docosanol 10 % CREA, Apply topically up to 5 times per day as needed until heals, Disp: 2 g, Rfl: 3 .  empagliflozin (JARDIANCE) 25 MG TABS tablet, Take 25 mg by mouth daily., Disp: 90 tablet, Rfl: 4 .  Flaxseed, Linseed, (FLAXSEED OIL) 1000 MG CAPS, Take  1,000 mg by mouth 2 (two) times daily., Disp: , Rfl:  .  fluticasone (FLONASE) 50 MCG/ACT nasal spray, Place 2 sprays into both nostrils daily., Disp: 16 g, Rfl: 1 .  gabapentin (NEURONTIN) 100 MG capsule, Take 100 mg by mouth 2 (two) times daily. , Disp: , Rfl:  .  insulin detemir (LEVEMIR) 100 UNIT/ML injection, Inject 0.3 mLs (30 Units total) into the skin 2 (two) times daily. (Patient taking differently: Inject 60-70 Units into the skin 2 (two) times daily. Taking 60 units hs and 70 units am), Disp: 100 mL, Rfl: 1 .  levETIRAcetam (KEPPRA) 500 MG tablet, 2 in the am and 3 at bedtime (Patient taking differently: Take 1,000-1,500 mg by mouth 2 (two) times daily. 2 in the am and 3 at bedtime), Disp: 450 tablet, Rfl: 4 .  lisinopril (PRINIVIL,ZESTRIL) 2.5 MG tablet, Take 1 tablet (2.5 mg total) by mouth daily., Disp: 30 tablet, Rfl: 2 .  loratadine (CLARITIN) 10 MG tablet, Take 10 mg by mouth daily. , Disp: , Rfl:  .  metFORMIN (GLUCOPHAGE) 850 MG tablet, Take 850 mg by mouth 2 (two) times daily with a meal. , Disp: , Rfl:  .  Misc Natural Products (OSTEO BI-FLEX TRIPLE STRENGTH PO), Take 1 tablet by mouth 2 (two) times daily. , Disp: , Rfl:  .  montelukast (SINGULAIR) 10 MG tablet, Take 1 tablet (10 mg total) by mouth daily.  For chronic cough, Disp: 30 tablet, Rfl: 2 .  neomycin-polymyxin-hydrocortisone (CORTISPORIN) otic solution, Place 4 drops into the left ear 4 (four) times daily. (Patient taking differently: Place 4 drops into the left ear 4 (four) times daily. ), Disp: 10 mL, Rfl: 1 .  Omega-3 Fatty Acids (FISH OIL) 1200 MG CAPS, Take 1,200 mg by mouth 2 (two) times daily. , Disp: , Rfl:  .  PARoxetine (PAXIL) 20 MG tablet, TAKE ONE TABLET EVERY DAY, Disp: 30 tablet, Rfl: 12 .  rivaroxaban (XARELTO) 20 MG TABS tablet, Take 1 tablet (20 mg total) by mouth daily with supper., Disp: 10 tablet, Rfl: 0 .  zolpidem (AMBIEN) 10 MG tablet, 1/2 TO 1 TABLET BY MOUTH AT BEDTIME (Patient taking differently:  Take 5-10 mg by mouth at bedtime. 1/2 TO 1 TABLET BY MOUTH AT BEDTIME), Disp: 90 tablet, Rfl: 1  Review of Systems  Constitutional: Negative for appetite change, chills, fever and weight loss.  HENT: Positive for congestion, rhinorrhea and sore throat. Negative for ear pain and postnasal drip.   Respiratory: Positive for cough and wheezing. Negative for hemoptysis, chest tightness and shortness of breath.   Cardiovascular: Negative for chest pain and palpitations.  Gastrointestinal: Negative for abdominal pain, heartburn, nausea and vomiting.  Musculoskeletal: Positive for myalgias.  Skin: Negative for rash.  Neurological: Negative for headaches.    Social History   Tobacco Use  . Smoking status: Former Smoker    Years: 45.00    Types: Cigars    Last attempt to quit: 04/08/2012    Years since quitting: 6.3  . Smokeless tobacco: Never Used  . Tobacco comment: cigarettes and cigars  Substance Use Topics  . Alcohol use: No    Alcohol/week: 0.0 standard drinks    Comment: quit 2013   Objective:   BP 119/70 (BP Location: Right Arm, Patient Position: Sitting, Cuff Size: Large)   Pulse 79   Temp 98 F (36.7 C) (Oral)   Resp 16   Ht 5\' 5"  (1.651 m)   Wt 247 lb (112 kg)   SpO2 97%   BMI 41.10 kg/m  Vitals:   08/27/18 0854  BP: 119/70  Pulse: 79  Resp: 16  Temp: 98 F (36.7 C)  TempSrc: Oral  SpO2: 97%  Weight: 247 lb (112 kg)  Height: 5\' 5"  (1.651 m)     Physical Exam  General Appearance:    Alert, cooperative, no distress  HENT:   bilateral TM normal without fluid or infection, neck without nodes, frontal sinus tender and nasal mucosa pale and congested  Eyes:    PERRL, conjunctiva/corneas clear, EOM's intact       Lungs:     Occasional faint expiratory wheezing, no rales.  respirations unlabored  Heart:    Regular rate and rhythm  MS:   Tender para lumbar muscles bilaterally. No spine tenderness of swelling.           Assessment & Plan:     1. Cough   2.  Bronchitis  - levofloxacin (LEVAQUIN) 750 MG tablet; Take 1 tablet (750 mg total) by mouth daily for 7 days.  Dispense: 7 tablet; Refill: 0  3. Acute frontal sinusitis, recurrence not specified  - levofloxacin (LEVAQUIN) 750 MG tablet; Take 1 tablet (750 mg total) by mouth daily for 7 days.  Dispense: 7 tablet; Refill: 0  4. Acute bilateral low back pain without sciatica  - cyclobenzaprine (FLEXERIL) 5 MG tablet; Take 1 tablet (5 mg total) by mouth 3 (  three) times daily as needed for up to 10 days for muscle spasms.  Dispense: 30 tablet; Refill: 0  Call if symptoms change or if not rapidly improving.          Lelon Huh, MD  [facility]

## 2018-09-01 LAB — CUP PACEART REMOTE DEVICE CHECK
MDC IDC PG IMPLANT DT: 20170317
MDC IDC SESS DTM: 20190924055102

## 2018-09-02 ENCOUNTER — Ambulatory Visit (INDEPENDENT_AMBULATORY_CARE_PROVIDER_SITE_OTHER): Payer: Medicare HMO | Admitting: *Deleted

## 2018-09-02 ENCOUNTER — Other Ambulatory Visit: Payer: Self-pay | Admitting: Family Medicine

## 2018-09-02 DIAGNOSIS — J40 Bronchitis, not specified as acute or chronic: Secondary | ICD-10-CM

## 2018-09-02 DIAGNOSIS — J0110 Acute frontal sinusitis, unspecified: Secondary | ICD-10-CM

## 2018-09-02 DIAGNOSIS — I639 Cerebral infarction, unspecified: Secondary | ICD-10-CM

## 2018-09-02 MED ORDER — LEVOFLOXACIN 750 MG PO TABS: 750.0000 mg | ORAL_TABLET | Freq: Every day | ORAL | 0 refills | Status: AC

## 2018-09-02 NOTE — Telephone Encounter (Signed)
Pt contacted office for refill request on the following medications:  levofloxacin (LEVAQUIN) 750 MG tablet   [facility]  Pt stated that he will take the last dose today. Pt stated he is feeling much better and is requesting another round because he feels that will totally take care of it. Please advise. Thanks TNP

## 2018-09-03 NOTE — Progress Notes (Signed)
Carelink Summary Report / Loop Recorder 

## 2018-09-07 LAB — CUP PACEART REMOTE DEVICE CHECK
Implantable Pulse Generator Implant Date: 20170317
MDC IDC SESS DTM: 20190930080323

## 2018-09-17 DIAGNOSIS — E119 Type 2 diabetes mellitus without complications: Secondary | ICD-10-CM | POA: Diagnosis not present

## 2018-09-17 LAB — HM DIABETES EYE EXAM

## 2018-09-21 NOTE — Progress Notes (Signed)
[facility]  PATIENT: Sean Hayden DOB: [birth year]   REASON FOR VISIT: Follow-up for continued episodes left-sided numbness of the hand , history of stroke, history of endarterectomy HISTORY FROM: Patient    HISTORY OF PRESENT ILLNESS:  UPDATE 10/16/2019CM Sean Hayden, 72 year old male returns for follow-up accompanied with his girlfriend.  His Keppra dose was increased at last visit by Dr. Krista Blue and his episodes of left hand numbness have decreased from 1-2 spells per month now even less.  He continues to describe the spells as a sudden onset of numbness starting in the left fifth finger spreading to the whole hand no loss of consciousness may last 2 to 3 minutes.  He is currently taking Keppra thousand in the morning and 1500 at night.  He has not had further stroke or TIA symptoms he remains on Xarelto.  Loop recorder with episode of atrial fib.  He returns for reevaluation   UPDATE Dec 29 2017:YY He is accompanied by his daughter-in-law at today's clinical visit, he is taking Keppra 500 mg 2 tablets twice a day, which has helped his spell significantly, still have recurrent spells once or twice each months, he describes as spells of sudden onset of numbness starting left fifth finger, spreading to involving whole left hand, stop at the wrist crease, then have left upper teeth numbness, no loss of consciousness, spell last about 5 minutes, before Keppra, numbness trouble involving the whole left arm, sometimes right hand,  He also snores a lot complains of daytime sleepiness,    UPDATE 09/24/2018CM Sean Hayden, 72 year old male returns for follow-up with history of atrial fibrillation uncontrolled diabetes hyperlipidemia hypertension and history of stroke and carotid endarterectomy past. He has had several episodes of his left hand numbness traveling to the left elbow since last seen. There is no confusion or difficulty using his hand during these episodes may last  about 2 minutes. For some reason he has been chewing a baby aspirin when this happens. He is currently on Keppra 500 in the morning thousand at night for suspected partial seizure. He denies any visual loss dysarthria gait abnormality or weakness. EEG in the past has shown mild background slowing. He has had several of these episodes of numbness since last seen. He has also had a larger portion of his time resected but was told it was not cancer. He remains on Xarelto for atrial fibrillation secondary stroke prevention without bruising or bleeding. He is also on Lipitor for hyperlipidemia  Without myalgias. His most recent hemoglobin A1c 9.6 and his insulin dose was increased to 62 units twice a day. He returns for reevaluation   UPDATE 06/21/2018CM Sean Hayden, 72 year old male returns for follow-up he has a history of atrial fibrillation, diabetes hyperlipidemia hypertension right carotid endarterectomy and stroke in the past. He has had episodes of left hand numbness traveling to the left elbow, no confusion no difficulty using the hand mild difficulty talking symptoms last 2-3 minutes. He was placed on Depakote but had tremor  switched to Keppra 500 twice daily for suspected partial seizure.In between episodes, he denies visual loss, dysarthria, gait abnormality, weakness.MRI of the brain without contrast in November 2017, no acute abnormality, there was extensive supratentorium white matter disease .EEG on December 30 2016 showed mild background slowing, he has had 3 episodes of numbness of the left hand since last seen. He just had an area under his tongue taken out and sent for pathology. He was told yesterday they needed to resect  a larger portion. He is still grieving  over the loss of his wife several months ago He returns for reevaluation  UPDATE February 10 2017:YY EEG on December 30 2016 showed mild background slowing, He has one more episode in Jan 2018, he had sudden onset of left hand numbness,  uncontrollable left hand shaking while taking Depakote ER 500 mg every night   HISTORY12/5/17  Sean Hayden is a 72 years old right-handed male, accompanied by his son-in-law, seen in refer by his primary care Dr. Lelon Huh for evaluation of stroke, initial evaluation was November 12 2016  He had a past medical history of diabetes, atrial fibrillation, hyperlipidemia, hypertension, is on chronic anticoagulation Xarelto treatments, he had history of right carotid artery endartectomy, stroke in the past,  He smokes. 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  On Oct 11, 2016, he had acute onset of left hand numbness, traveling to his left elbow, left shoulder, left upper and lower lip, he had difficulty talking, but denies confusion, difficulty using his left hand, symptoms last about 5 minutes.  He had a very similar episode in March 2017  In between episodes, he denies visual loss, dysarthria, gait abnormality, weakness.  We have personally reviewed MRI of the brain without contrast in November 2017, no acute abnormality, there was extensive supratentorium white matter disease    REVIEW OF SYSTEMS: Full 14 system review of systems performed and notable only for those listed, all others are neg:  Constitutional: Fatigue  Cardiovascular: neg Ear/Nose/Throat: neg  Skin: neg Eyes  Itching Respiratory : neg Gastroitestinal: Urinary urgency  Hematology/Lymphatic: neg  Endocrine: neg Musculoskeletal: back pain Allergy/Immunology: neg Neurological: Intermittent numbness of the left hand, seizure  Psychiatric:  anxiety  Sleep Insomnia   ALLERGIES: Allergies  Allergen Reactions  . Actos [Pioglitazone]     UNSPECIFIED REACTION     HOME MEDICATIONS: Outpatient Medications Prior to Visit  Medication Sig Dispense Refill  . acyclovir (ZOVIRAX) 400 MG tablet 1/2 tablet up to 5 times a day for cold sores 30 tablet 5  . atorvastatin (LIPITOR) 20 MG tablet Take 20 mg by  mouth every evening.     . benzocaine (ORAJEL) 10 % mucosal gel Use as directed 1 application in the mouth or throat as needed for mouth pain.    . Cholecalciferol (VITAMIN D PO) Take 1 tablet by mouth daily.    . Docosanol 10 % CREA Apply topically up to 5 times per day as needed until heals 2 g 3  . empagliflozin (JARDIANCE) 25 MG TABS tablet Take 25 mg by mouth daily. 90 tablet 4  . Flaxseed, Linseed, (FLAXSEED OIL) 1000 MG CAPS Take 1,000 mg by mouth 2 (two) times daily.    . fluticasone (FLONASE) 50 MCG/ACT nasal spray Place 2 sprays into both nostrils daily. 16 g 1  . gabapentin (NEURONTIN) 100 MG capsule Take 100 mg by mouth 2 (two) times daily.     . insulin detemir (LEVEMIR) 100 UNIT/ML injection Inject 0.3 mLs (30 Units total) into the skin 2 (two) times daily. 100 mL 1  . levETIRAcetam (KEPPRA) 500 MG tablet 2 in the am and 3 at bedtime 450 tablet 4  . lisinopril (PRINIVIL,ZESTRIL) 2.5 MG tablet Take 1 tablet (2.5 mg total) by mouth daily. 30 tablet 2  . loratadine (CLARITIN) 10 MG tablet Take 10 mg by mouth daily.     . metFORMIN (GLUCOPHAGE) 850 MG tablet Take 850 mg by mouth 2 (two)  times daily with a meal.     . Misc Natural Products (OSTEO BI-FLEX TRIPLE STRENGTH PO) Take 1 tablet by mouth 2 (two) times daily.     . montelukast (SINGULAIR) 10 MG tablet Take 1 tablet (10 mg total) by mouth daily. For chronic cough 30 tablet 2  . neomycin-polymyxin-hydrocortisone (CORTISPORIN) otic solution Place 4 drops into the left ear 4 (four) times daily. 10 mL 1  . Omega-3 Fatty Acids (FISH OIL) 1200 MG CAPS Take 1,200 mg by mouth 2 (two) times daily.     [name] PARoxetine (PAXIL) 20 MG tablet TAKE ONE TABLET EVERY DAY 30 tablet 12  . rivaroxaban (XARELTO) 20 MG TABS tablet Take 1 tablet (20 mg total) by mouth daily with supper. 10 tablet 0  . zolpidem (AMBIEN) 10 MG tablet 1/2 TO 1 TABLET BY MOUTH AT BEDTIME 90 tablet 1   No facility-administered medications prior to visit.     PAST MEDICAL  HISTORY: Past Medical History:  Diagnosis Date  . Aortic stenosis    moderate by 02/2016 echo  . B12 deficiency   . Cancer ([place])    skin cancer - face   . CVA (cerebral vascular accident) ([place])    02/2016 no residual  . Diabetes mellitus without complication (HCC)    Type II  . Diverticulosis   . Dyspnea    with activity  . Gout   . Gouty arthropathy   . Hemorrhoids   . History of kidney stones   . History of right-sided carotid endarterectomy 04/17/12  . Hyperplastic colon polyp   . Hypertension   . MRSA infection   . Neuropathy   . OSA (obstructive sleep apnea)   . PAF (paroxysmal atrial fibrillation) ([place])   . Personal history of urinary calculi 05/25/2015  . Pneumonia    1990's  . Seizures ([place])     PAST SURGICAL HISTORY: Past Surgical History:  Procedure Laterality Date  . Arm fracture Left   . CAROTID ARTERY ANGIOPLASTY Right 04/17/2012   Dr. Delana Meyer, [facility]  . CAROTID ENDARTERECTOMY Right 04/17/2012   Dr. Delana Meyer, [facility]  . CHOLECYSTECTOMY  2005   lap  . COLONOSCOPY    . CYST EXCISION     "between legs"  . EP IMPLANTABLE DEVICE N/A 02/23/2016   Procedure: Loop Recorder Insertion;  Surgeon: Thompson Grayer, MD;  Location: [place] [facility];  Service: Cardiovascular;  Laterality: N/A;  . HEMORRHOID SURGERY  2009  . INCISION AND DRAINAGE WOUND WITH NERVE AND ARTERY REPAIR Left 10/04/2017   Procedure: LEFT HAND WOUND EXPLORATION repair as necessary;  Surgeon: Iran Planas, MD;  Location: [place];  Service: Orthopedics;  Laterality: Left;  . TEE WITHOUT CARDIOVERSION N/A 02/23/2016   Procedure: TRANSESOPHAGEAL ECHOCARDIOGRAM (TEE);  Surgeon: Pixie Casino, MD;  Location: [facility] ENDOSCOPY;  Service: Cardiovascular;  Laterality: N/A;  ALSO GETTING A LOOP    FAMILY HISTORY: Family History  Problem Relation Age of Onset  . Hodgkin's lymphoma Father   . Diabetes Mother     SOCIAL HISTORY: Social History   Socioeconomic History  . Marital status: Widowed    Spouse name:  Not on file  . Number of children: 2  . Years of education: HS Grad  . Highest education level: 12th grade  Occupational History  . Occupation: Retired  Scientific laboratory technician  . Financial resource strain: Not hard at all  . Food insecurity:    Worry: Never true    Inability: Never true  . Transportation needs:  Medical: No    Non-medical: No  Tobacco Use  . Smoking status: Former Smoker    Years: 45.00    Types: Cigars    Last attempt to quit: 04/08/2012    Years since quitting: 6.4  . Smokeless tobacco: Never Used  . Tobacco comment: cigarettes and cigars  Substance and Sexual Activity  . Alcohol use: No    Alcohol/week: 0.0 standard drinks    Comment: quit 2013  . Drug use: No  . Sexual activity: Not on file  Lifestyle  . Physical activity:    Days per week: Not on file    Minutes per session: Not on file  . Stress: Rather much  Relationships  . Social connections:    Talks on phone: Not on file    Gets together: Not on file    Attends religious service: Not on file    Active member of club or organization: Not on file    Attends meetings of clubs or organizations: Not on file    Relationship status: Not on file  . Intimate partner violence:    Fear of current or ex partner: Not on file    Emotionally abused: Not on file    Physically abused: Not on file    Forced sexual activity: Not on file  Other Topics Concern  . Not on file  Social History Narrative   Lives at home with his granddaughter.   Right-handed.   No caffeine use.         PHYSICAL EXAM  Vitals:   09/23/18 1033  BP: 120/60  Pulse: 75  Weight: 248 lb 9.6 oz (112.8 kg)  Height: 5\' 5"  (1.651 m)   Body mass index is 41.37 kg/m.  Generalized: Well developed, obese male  no acute distress  Head: normocephalic and atraumatic,. Oropharynx benign  Neck: Supple, no carotid bruits  Cardiac: Regular rate rhythm, no murmur  Musculoskeletal: No deformity   Neurological examination   Mentation: Alert  oriented to time, place, history taking. Attention span and concentration appropriate. Recent and remote memory intact.  Follows all commands speech and language fluent.   Cranial nerve II-XII: Pupils were equal round reactive to light extraocular movements were full, visual field were full on confrontational test. Facial sensation and strength were normal. hearing was intact to finger rubbing bilaterally. Uvula tongue midline. head turning and shoulder shrug were normal and symmetric.Tongue protrusion into cheek strength was normal. Motor: normal bulk and tone, full strength in the BUE, BLE, fine finger movements normal, no pronator drift. No focal weakness Sensory: normal and symmetric to light touch, pinprick, and  Vibration, in the upper and lower extremities  Coordination: finger-nose-finger, heel-to-shin bilaterally, no dysmetria Reflexes: Symmetric upper and lower plantar responses were flexor bilaterally. Gait and Station: Rising up from seated position with push off, cautious mildly unsteady gait , stooped posture tandem gait is unsteady. No assistive device  DIAGNOSTIC DATA (LABS, IMAGING, TESTING) - I reviewed patient records, labs, notes, testing and imaging myself where available.  Lab Results  Component Value Date   WBC 5.8 10/04/2017   HGB 10.0 (L) 10/04/2017   HCT 30.1 (L) 10/04/2017   MCV 92.0 10/04/2017   PLT 196 10/04/2017      Component Value Date/Time   NA 136 09/29/2017 2204   NA 138 03/03/2017 1019   K 4.7 09/29/2017 2204   K 4.6 11/23/2015   K 5.1 04/18/2012 0412   CL 99 (L) 09/29/2017 2204   CL 107 04/18/2012  0412   CO2 24 09/23/2017 1953   CO2 25 04/18/2012 0412   GLUCOSE 282 (H) 09/29/2017 2204   GLUCOSE 188 (H) 04/18/2012 0412   BUN 11 09/29/2017 2204   BUN 12 01/23/2017   BUN 13 04/18/2012 0412   CREATININE 0.70 09/29/2017 2204   CREATININE 1.07 04/18/2012 0412   CALCIUM 9.1 09/23/2017 1953   CALCIUM 8.7 03/03/2017 1019   PROT 7.5 09/23/2017 1953     ALBUMIN 3.9 09/23/2017 1953   AST 23 09/23/2017 1953   ALT 20 09/23/2017 1953   ALKPHOS 76 09/23/2017 1953   BILITOT 0.4 09/23/2017 1953   GFRNONAA >60 09/23/2017 1953   GFRNONAA >60 04/18/2012 0412   GFRAA >60 09/23/2017 1953   GFRAA 118.2 03/03/2017 1019   Lab Results  Component Value Date   CHOL 113 02/21/2016   HDL 34 (L) 02/21/2016   LDLCALC 37 02/21/2016   TRIG 209 (H) 02/21/2016   CHOLHDL 3.3 02/21/2016   Lab Results  Component Value Date   HGBA1C 7.1 (A) 06/08/2018   Lab Results  Component Value Date   VITAMINB12 1,146 (H) 02/28/2016   Lab Results  Component Value Date   TSH 1.82 11/23/2015      ASSESSMENT AND PLAN  72 y.o. year old male  has a past medical history of B12 deficiency; CVA (cerebral vascular accident) ([place]); Diabetes mellitus without complication ([place]); History of right-sided carotid endarterectomy (04/17/12);  Hypertension; Kidney stones;   Neuropathy; OSA (obstructive sleep apnea); And recurrent left-sided paresthesias history of extensive white matter disease. Most consistent with partial seizure EEG showed mild background slowing. MRI of the brain showed sensitive left matter disease, more on the left hemisphere,  Increase Keppra to 3 tabs am and 3 tabs  pm will refill Continue Xarelto Continue Lipitor He continues to hold off on sleep study Follow-up in 8 months  with Dr. Luan Pulling, [facility], [facility], [facility] [address], [place], [place] [address] [phone]

## 2018-09-23 ENCOUNTER — Encounter: Payer: Self-pay | Admitting: Nurse Practitioner

## 2018-09-23 ENCOUNTER — Ambulatory Visit: Payer: Medicare HMO | Admitting: Nurse Practitioner

## 2018-09-23 VITALS — BP 120/60 | HR 75 | Ht 65.0 in | Wt 248.6 lb

## 2018-09-23 DIAGNOSIS — R569 Unspecified convulsions: Secondary | ICD-10-CM

## 2018-09-23 DIAGNOSIS — I10 Essential (primary) hypertension: Secondary | ICD-10-CM

## 2018-09-23 DIAGNOSIS — E785 Hyperlipidemia, unspecified: Secondary | ICD-10-CM

## 2018-09-23 MED ORDER — LEVETIRACETAM 500 MG PO TABS: 1500.0000 mg | ORAL_TABLET | Freq: Two times a day (BID) | ORAL | 8 refills | Status: DC

## 2018-09-23 NOTE — Patient Instructions (Signed)
Increase Keppra to 3 tabs am and 3 tabs  pm will refill Follow-up in 8 months  with Dr. Krista Blue

## 2018-09-29 NOTE — Progress Notes (Signed)
I have reviewed and agreed above plan. 

## 2018-10-05 ENCOUNTER — Ambulatory Visit (INDEPENDENT_AMBULATORY_CARE_PROVIDER_SITE_OTHER): Payer: Medicare HMO | Admitting: *Deleted

## 2018-10-05 DIAGNOSIS — I639 Cerebral infarction, unspecified: Secondary | ICD-10-CM | POA: Diagnosis not present

## 2018-10-06 NOTE — Progress Notes (Signed)
Carelink Summary Report / Loop Recorder 

## 2018-10-12 ENCOUNTER — Ambulatory Visit: Payer: Self-pay | Admitting: Family Medicine

## 2018-10-16 IMAGING — MR MR HEAD W/O CM
9 of 10 series · 38 of 48 positions shown · non-contrast
Comparison: None

CLINICAL DATA: Left arm and neck numbness.  Facial droop

EXAM:
MRI HEAD WITHOUT CONTRAST
TECHNIQUE: Multiplanar, multiecho pulse sequences of the brain and surrounding
structures were obtained without intravenous contrast.

[Series 4: T1 · sagittal · 5.0mm · 0.47mm/px · 2 of 25 slices shown]
[im 1/25]
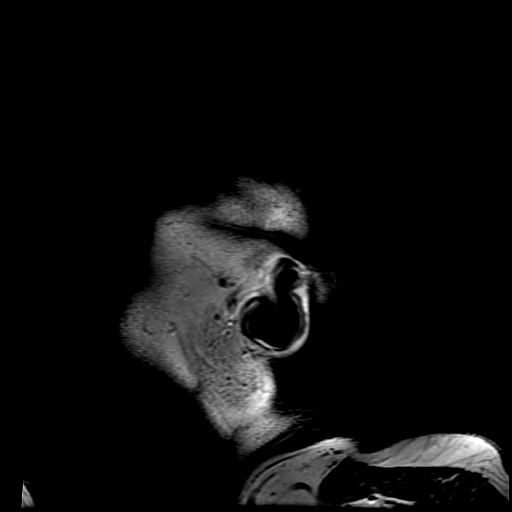
[im 25/25]
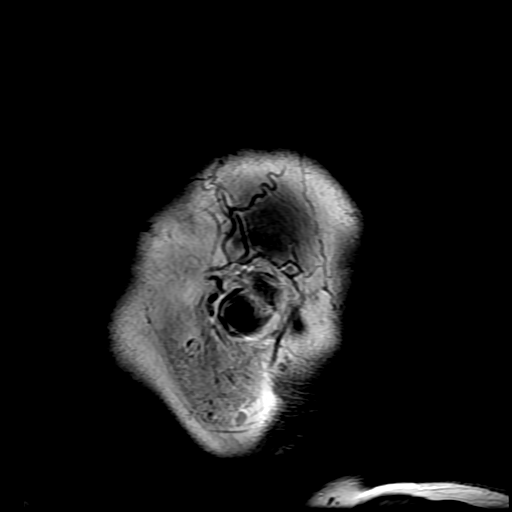

[Series 5: DWI · axial · 3.0mm · 1.09mm/px · z∈[+10,+156]mm · 10 of 102 slices shown (1 of 4)]
[im 1/102]
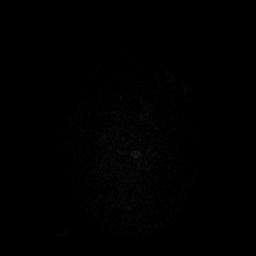
[im 12/102]
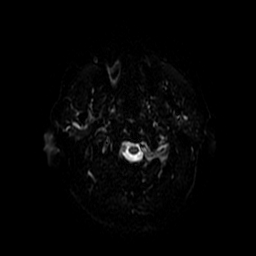
[im 23/102]
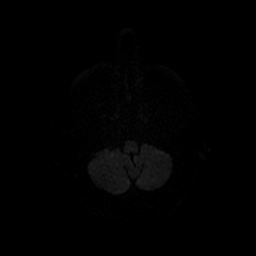
[im 34/102]
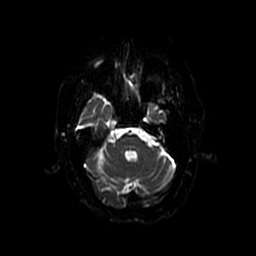
[im 45/102]
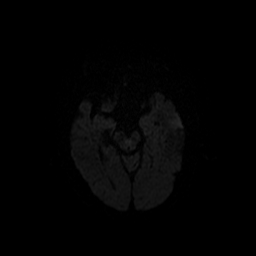
[im 57/102]
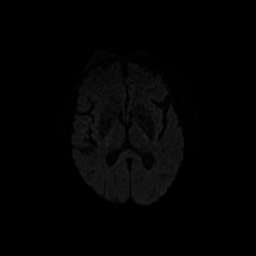
[im 68/102]
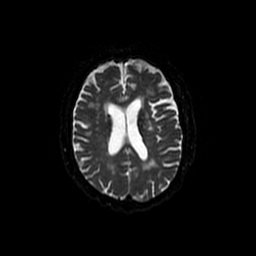
[im 79/102]
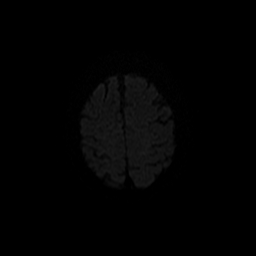
[im 90/102]
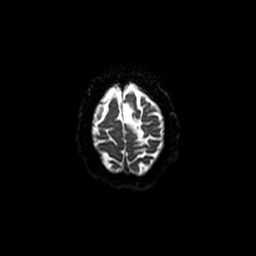
[im 102/102]
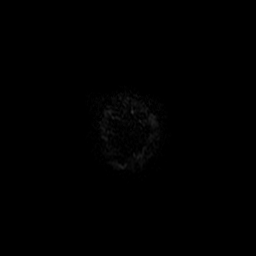

[Series 6: T2 · axial · 5.0mm · 0.55mm/px · z∈[+10,+156]mm · 3 of 26 slices shown (1 of 2)]
[im 1/26]
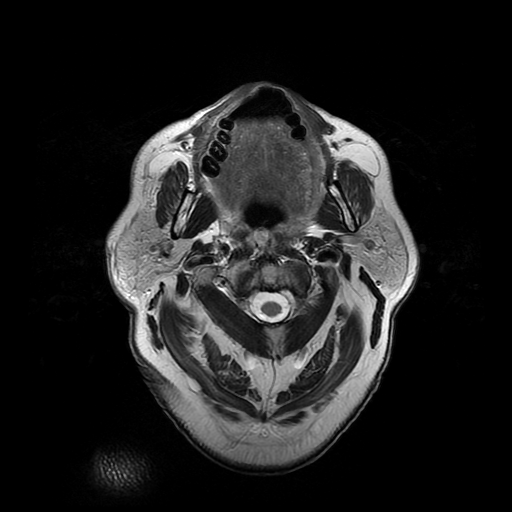
[im 13/26]
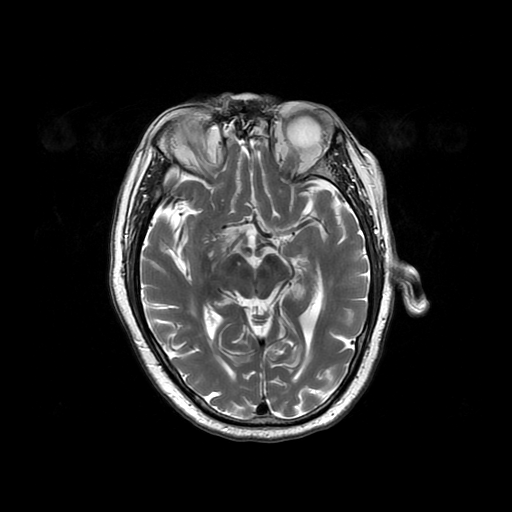
[im 26/26]
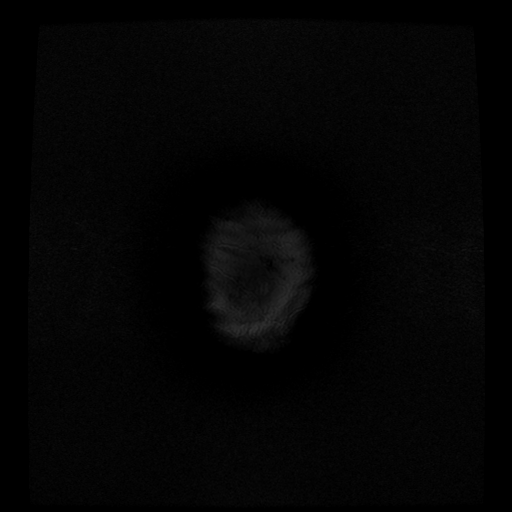

[Series 7: DWI · coronal · 5.0mm · 1.09mm/px · 7 of 72 slices shown (2 of 4)]
[im 1/72]
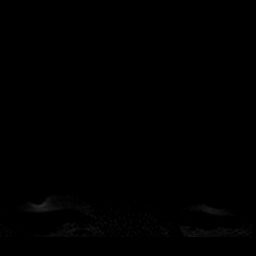
[im 12/72]
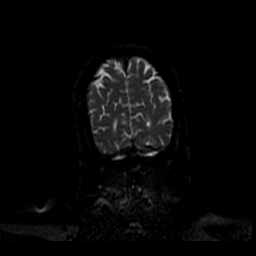
[im 24/72]
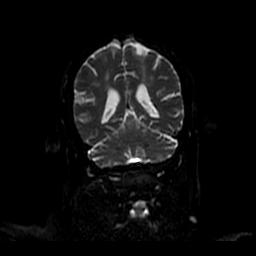
[im 36/72]
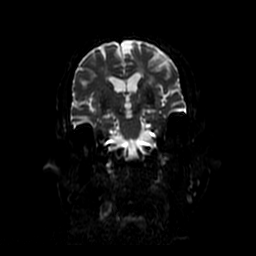
[im 48/72]
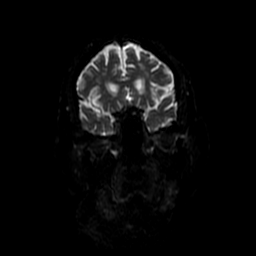
[im 60/72]
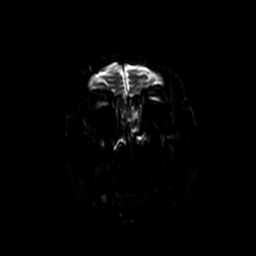
[im 72/72]
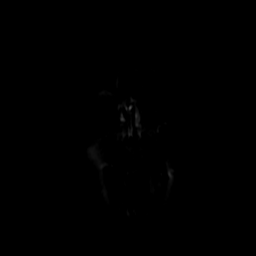

[Series 8: FLAIR · axial · 5.0mm · 0.55mm/px · z∈[+5,+148]mm · 2 of 22 slices shown]
[im 1/22]
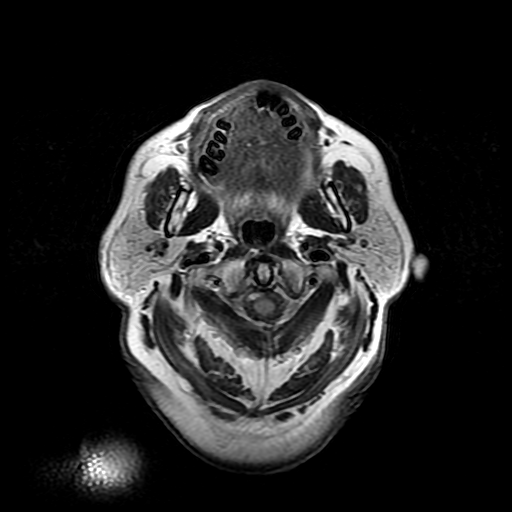
[im 22/22]
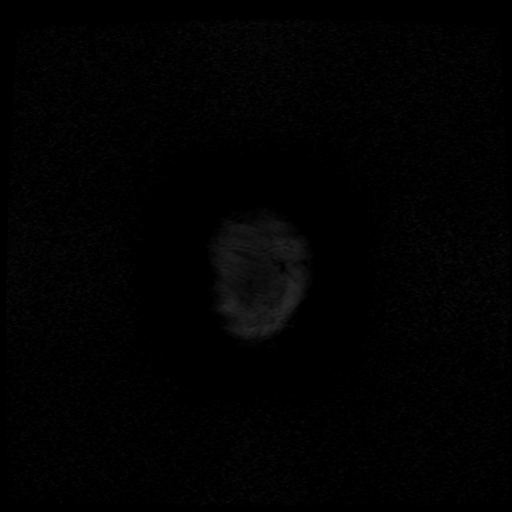

[Series 9: ax mpgr · axial · 5.0mm · 0.55mm/px · z∈[+5,+148]mm · 2 of 22 slices shown]
[im 1/22]
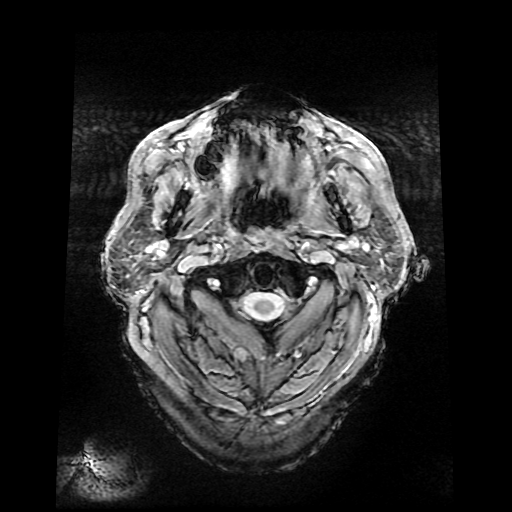
[im 22/22]
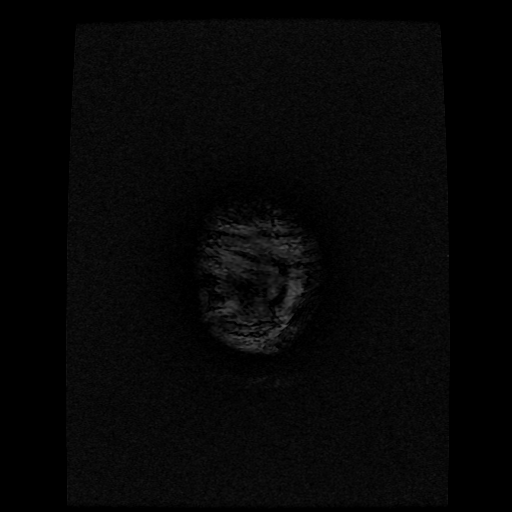

[Series 11: T2 · coronal · 5.0mm · 1.09mm/px · 3 of 28 slices shown (2 of 2)]
[im 1/28]
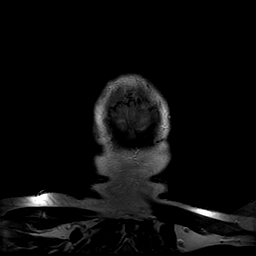
[im 14/28]
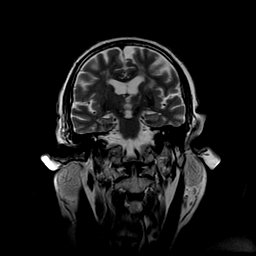
[im 28/28]
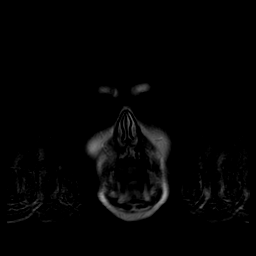

[Series 500: DWI · axial · 3.0mm · 1.09mm/px · z∈[+10,+156]mm · 5 of 51 slices shown (3 of 4)]
[im 1/51]
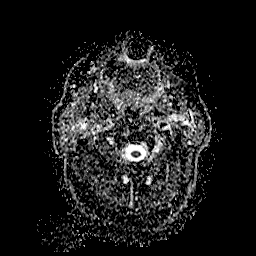
[im 13/51]
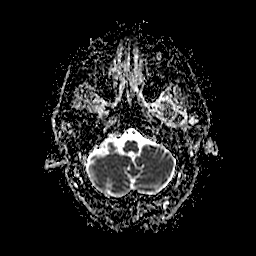
[im 26/51]
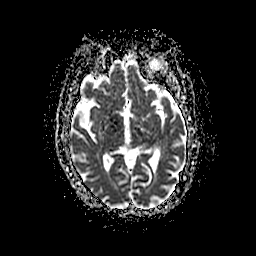
[im 38/51]
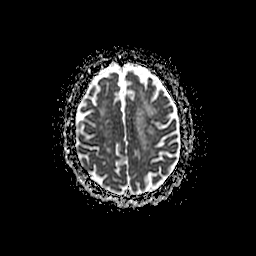
[im 51/51]
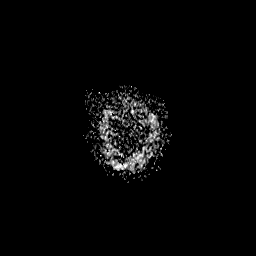

[Series 700: DWI · coronal · 5.0mm · 1.09mm/px · 4 of 36 slices shown (4 of 4)]
[im 1/36]
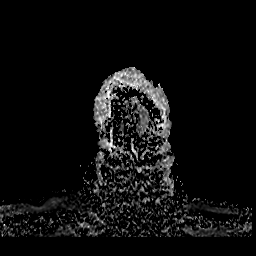
[im 12/36]
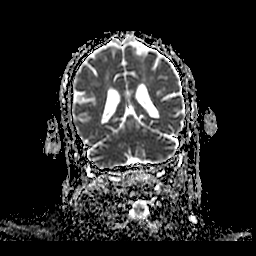
[im 24/36]
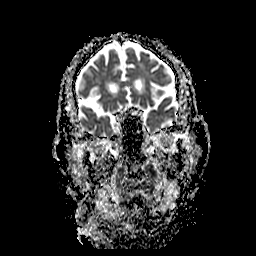
[im 36/36]
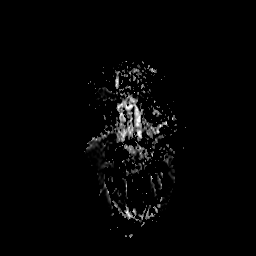

[38 of 48 positions shown; findings below may reference images not displayed]

FINDINGS: Brain: Negative for acute infarct. Chronic infarct left frontal
convexity. Chronic microvascular ischemic changes throughout the
white matter. Small chronic infarcts in the basal ganglia
bilaterally. Chronic microhemorrhage right caudate. Negative for
mass or edema. No shift of the midline structures.

Ventricle size normal.  Mild cortical atrophy.

Vascular: Normal arterial flow void.

Skull and upper cervical spine: Negative

Sinuses/Orbits: Mucosal edema throughout the paranasal sinuses.
Normal orbit.

Other: None
IMPRESSION: Negative for acute infarct. Chronic microvascular ischemic change in
chronic infarct left frontal cortex over the convexity.

## 2018-10-24 ENCOUNTER — Ambulatory Visit (INDEPENDENT_AMBULATORY_CARE_PROVIDER_SITE_OTHER): Payer: Medicare HMO | Admitting: Family Medicine

## 2018-10-24 ENCOUNTER — Encounter: Payer: Self-pay | Admitting: Family Medicine

## 2018-10-24 VITALS — BP 124/68 | HR 80 | Temp 97.7°F | Resp 16 | Wt 244.0 lb

## 2018-10-24 DIAGNOSIS — L0291 Cutaneous abscess, unspecified: Secondary | ICD-10-CM

## 2018-10-24 MED ORDER — DOXYCYCLINE HYCLATE 100 MG PO TABS: 100.0000 mg | ORAL_TABLET | Freq: Two times a day (BID) | ORAL | 0 refills | Status: AC

## 2018-10-24 NOTE — Patient Instructions (Signed)

## 2018-10-24 NOTE — Progress Notes (Signed)
Patient: Sean Hayden Male    DOB: March 16, 1946   72 y.o.   MRN: [id number] Visit Date: 10/24/2018  Today's Provider: Lavon Paganini, MD   Chief Complaint  Patient presents with  . Cyst    Under left arm  . URI   Subjective:    HPI  Abscess: Patient presents for evaluation of a cutaneous abscess. Lesion is located in the left axilla. Onset was 3 days ago. Symptoms have stabilized. Abscess has associated symptoms of none. Patient does have previous history of cutaneous abscesses. Patient does have diabetes.  Tried putting an onion on it to draw out the pus for 2 days. Has had slow drainage.  Denies fever, N/V/D, abd pain.      Allergies  Allergen Reactions  . Actos [Pioglitazone]     UNSPECIFIED REACTION      Current Outpatient Medications:  .  acyclovir (ZOVIRAX) 400 MG tablet, 1/2 tablet up to 5 times a day for cold sores, Disp: 30 tablet, Rfl: 5 .  atorvastatin (LIPITOR) 20 MG tablet, Take 20 mg by mouth every evening. , Disp: , Rfl:  .  benzocaine (ORAJEL) 10 % mucosal gel, Use as directed 1 application in the mouth or throat as needed for mouth pain., Disp: , Rfl:  .  Cholecalciferol (VITAMIN D PO), Take 1 tablet by mouth daily., Disp: , Rfl:  .  Docosanol 10 % CREA, Apply topically up to 5 times per day as needed until heals, Disp: 2 g, Rfl: 3 .  empagliflozin (JARDIANCE) 25 MG TABS tablet, Take 25 mg by mouth daily., Disp: 90 tablet, Rfl: 4 .  Flaxseed, Linseed, (FLAXSEED OIL) 1000 MG CAPS, Take 1,000 mg by mouth 2 (two) times daily., Disp: , Rfl:  .  fluticasone (FLONASE) 50 MCG/ACT nasal spray, Place 2 sprays into both nostrils daily., Disp: 16 g, Rfl: 1 .  gabapentin (NEURONTIN) 100 MG capsule, Take 100 mg by mouth 2 (two) times daily. , Disp: , Rfl:  .  insulin detemir (LEVEMIR) 100 UNIT/ML injection, Inject 0.3 mLs (30 Units total) into the skin 2 (two) times daily., Disp: 100 mL, Rfl: 1 .  levETIRAcetam (KEPPRA) 500 MG tablet, Take 3 tablets (1,500 mg  total) by mouth 2 (two) times daily. 3 in the am and 3 at bedtime, Disp: 180 tablet, Rfl: 8 .  lisinopril (PRINIVIL,ZESTRIL) 2.5 MG tablet, Take 1 tablet (2.5 mg total) by mouth daily., Disp: 30 tablet, Rfl: 2 .  loratadine (CLARITIN) 10 MG tablet, Take 10 mg by mouth daily. , Disp: , Rfl:  .  metFORMIN (GLUCOPHAGE) 850 MG tablet, Take 850 mg by mouth 2 (two) times daily with a meal. , Disp: , Rfl:  .  Misc Natural Products (OSTEO BI-FLEX TRIPLE STRENGTH PO), Take 1 tablet by mouth 2 (two) times daily. , Disp: , Rfl:  .  montelukast (SINGULAIR) 10 MG tablet, Take 1 tablet (10 mg total) by mouth daily. For chronic cough, Disp: 30 tablet, Rfl: 2 .  neomycin-polymyxin-hydrocortisone (CORTISPORIN) otic solution, Place 4 drops into the left ear 4 (four) times daily., Disp: 10 mL, Rfl: 1 .  Omega-3 Fatty Acids (FISH OIL) 1200 MG CAPS, Take 1,200 mg by mouth 2 (two) times daily. , Disp: , Rfl:  .  PARoxetine (PAXIL) 20 MG tablet, TAKE ONE TABLET EVERY DAY, Disp: 30 tablet, Rfl: 12 .  rivaroxaban (XARELTO) 20 MG TABS tablet, Take 1 tablet (20 mg total) by mouth daily with supper., Disp: 10 tablet, Rfl:  0 .  zolpidem (AMBIEN) 10 MG tablet, 1/2 TO 1 TABLET BY MOUTH AT BEDTIME, Disp: 90 tablet, Rfl: 1  Review of Systems  Constitutional: Negative.   HENT: Positive for congestion, postnasal drip and sore throat. Negative for ear discharge, ear pain, rhinorrhea, sinus pressure, sinus pain and sneezing.   Eyes: Negative.   Respiratory: Positive for cough. Negative for apnea, choking, chest tightness, shortness of breath, wheezing and stridor.   Cardiovascular: Negative.   Gastrointestinal: Negative.   Skin: Positive for color change. Negative for pallor and rash.  Neurological: Negative.   Psychiatric/Behavioral: Negative.     Social History   Tobacco Use  . Smoking status: Former Smoker    Years: 45.00    Types: Cigars    Last attempt to quit: 04/08/2012    Years since quitting: 6.5  . Smokeless  tobacco: Never Used  . Tobacco comment: cigarettes and cigars  Substance Use Topics  . Alcohol use: No    Alcohol/week: 0.0 standard drinks    Comment: quit 2013   Objective:   BP 124/68 (BP Location: Right Arm, Patient Position: Sitting, Cuff Size: Normal)   Pulse 80   Temp 97.7 F (36.5 C) (Oral)   Resp 16   Wt 244 lb (110.7 kg)   BMI 40.60 kg/m  Vitals:   10/24/18 0907  BP: 124/68  Pulse: 80  Resp: 16  Temp: 97.7 F (36.5 C)  TempSrc: Oral  Weight: 244 lb (110.7 kg)     Physical Exam  Constitutional: He is oriented to person, place, and time. He appears well-developed and well-nourished. No distress.  HENT:  Head: Normocephalic and atraumatic.  Right Ear: External ear normal.  Left Ear: External ear normal.  Nose: Nose normal.  Mouth/Throat: Oropharynx is clear and moist. No oropharyngeal exudate.  Eyes: Pupils are equal, round, and reactive to light. Conjunctivae are normal. Right eye exhibits no discharge. Left eye exhibits no discharge. No scleral icterus.  Neck: Neck supple. No thyromegaly present.  Cardiovascular: Normal rate, regular rhythm, normal heart sounds and intact distal pulses.  No murmur heard. Pulmonary/Chest: Effort normal and breath sounds normal. No respiratory distress. He has no wheezes. He has no rales.  Musculoskeletal: He exhibits no edema.  Lymphadenopathy:    He has no cervical adenopathy.  Neurological: He is alert and oriented to person, place, and time.  Skin: Skin is warm and dry. Capillary refill takes less than 2 seconds.  1-2cm area of erythema and induration in L axilla that is TTP.  No fluctuance  Vitals reviewed.       Assessment & Plan:   1. Abscess -Abscess of right axilla with no fluctuance and no indication for I&D -We will treat with 7-day course of doxycycline - Discussed warm compresses and Tylenol as needed -Return precautions discussed    Meds ordered this encounter  Medications  . doxycycline  (VIBRA-TABS) 100 MG tablet    Sig: Take 1 tablet (100 mg total) by mouth 2 (two) times daily for 7 days.    Dispense:  14 tablet    Refill:  0     Return if symptoms worsen or fail to improve.   The entirety of the information documented in the History of Present Illness, Review of Systems and Physical Exam were personally obtained by me. Portions of this information were initially documented by Ashley Royalty, CMA and reviewed by me for thoroughness and accuracy.    Virginia Crews, MD, MPH [facility] 10/24/2018 9:25  AM

## 2018-10-28 LAB — CUP PACEART REMOTE DEVICE CHECK
Date Time Interrogation Session: 20191120134157
Implantable Pulse Generator Implant Date: 20170317

## 2018-11-09 ENCOUNTER — Ambulatory Visit (INDEPENDENT_AMBULATORY_CARE_PROVIDER_SITE_OTHER): Payer: Medicare HMO

## 2018-11-09 DIAGNOSIS — I639 Cerebral infarction, unspecified: Secondary | ICD-10-CM | POA: Diagnosis not present

## 2018-11-09 NOTE — Progress Notes (Signed)
Carelink Summary Report / Loop Recorder 

## 2018-11-20 ENCOUNTER — Telehealth: Payer: Self-pay

## 2018-11-20 NOTE — Telephone Encounter (Signed)
Called pt to scheduled his AWV for the end of year and pt states he would like to wait until the new year. Pt requested a CB in January. Note made. -MM

## 2018-12-10 ENCOUNTER — Ambulatory Visit (INDEPENDENT_AMBULATORY_CARE_PROVIDER_SITE_OTHER): Payer: Medicare HMO

## 2018-12-10 DIAGNOSIS — I639 Cerebral infarction, unspecified: Secondary | ICD-10-CM | POA: Diagnosis not present

## 2018-12-11 ENCOUNTER — Other Ambulatory Visit: Payer: Self-pay | Admitting: Family Medicine

## 2018-12-11 DIAGNOSIS — R053 Chronic cough: Secondary | ICD-10-CM

## 2018-12-11 DIAGNOSIS — R05 Cough: Secondary | ICD-10-CM

## 2018-12-11 LAB — CUP PACEART REMOTE DEVICE CHECK
Date Time Interrogation Session: 20200103194839
MDC IDC PG IMPLANT DT: 20170317

## 2018-12-11 NOTE — Progress Notes (Signed)
Carelink Summary Report / Loop Recorder 

## 2018-12-14 NOTE — Telephone Encounter (Signed)
Scheduled AWV for 10/24/19 @ 840 AM. -MM

## 2018-12-23 ENCOUNTER — Ambulatory Visit (INDEPENDENT_AMBULATORY_CARE_PROVIDER_SITE_OTHER): Payer: Medicare HMO

## 2018-12-23 VITALS — BP 128/58 | HR 82 | Temp 99.0°F | Ht 65.0 in | Wt 239.8 lb

## 2018-12-23 DIAGNOSIS — Z0000 Encounter for general adult medical examination without abnormal findings: Secondary | ICD-10-CM

## 2018-12-23 NOTE — Patient Instructions (Signed)
Sean Hayden , Thank you for taking time to come for your Medicare Wellness Visit. I appreciate your ongoing commitment to your health goals. Please review the following plan we discussed and let me know if I can assist you in the future.   Screening recommendations/referrals: Colonoscopy: Cologuard up to date, due 02/20/21 Recommended yearly ophthalmology/optometry visit for glaucoma screening and checkup Recommended yearly dental visit for hygiene and checkup  Vaccinations: Influenza vaccine: Up to date Pneumococcal vaccine: Completed series Tdap vaccine: Up to date, due 09/2027 Shingles vaccine: Pt declines today.     Advanced directives: Please bring a copy of your POA (Power of Attorney) and/or Living Will to your next appointment.   Conditions/risks identified: Obesity- recommend to eat 2 servings of fruits and vegetables each a day.   Next appointment: 01/14/19 @ 10:00 AM with Dr Caryn Section.   Preventive Care 74 Years and Older, Male Preventive care refers to lifestyle choices and visits with your health care provider that can promote health and wellness. What does preventive care include?  A yearly physical exam. This is also called an annual well check.  Dental exams once or twice a year.  Routine eye exams. Ask your health care provider how often you should have your eyes checked.  Personal lifestyle choices, including:  Daily care of your teeth and gums.  Regular physical activity.  Eating a healthy diet.  Avoiding tobacco and drug use.  Limiting alcohol use.  Practicing safe sex.  Taking low doses of aspirin every day.  Taking vitamin and mineral supplements as recommended by your health care provider. What happens during an annual well check? The services and screenings done by your health care provider during your annual well check will depend on your age, overall health, lifestyle risk factors, and family history of disease. Counseling  Your health care  provider may ask you questions about your:  Alcohol use.  Tobacco use.  Drug use.  Emotional well-being.  Home and relationship well-being.  Sexual activity.  Eating habits.  History of falls.  Memory and ability to understand (cognition).  Work and work Statistician. Screening  You may have the following tests or measurements:  Height, weight, and BMI.  Blood pressure.  Lipid and cholesterol levels. These may be checked every 5 years, or more frequently if you are over 18 years old.  Skin check.  Lung cancer screening. You may have this screening every year starting at age 18 if you have a 30-pack-year history of smoking and currently smoke or have quit within the past 15 years.  Fecal occult blood test (FOBT) of the stool. You may have this test every year starting at age 74.  Flexible sigmoidoscopy or colonoscopy. You may have a sigmoidoscopy every 5 years or a colonoscopy every 10 years starting at age 74.  Prostate cancer screening. Recommendations will vary depending on your family history and other risks.  Hepatitis C blood test.  Hepatitis B blood test.  Sexually transmitted disease (STD) testing.  Diabetes screening. This is done by checking your blood sugar (glucose) after you have not eaten for a while (fasting). You may have this done every 1-3 years.  Abdominal aortic aneurysm (AAA) screening. You may need this if you are a current or former smoker.  Osteoporosis. You may be screened starting at age 43 if you are at high risk. Talk with your health care provider about your test results, treatment options, and if necessary, the need for more tests. Vaccines  Your health  care provider may recommend certain vaccines, such as:  Influenza vaccine. This is recommended every year.  Tetanus, diphtheria, and acellular pertussis (Tdap, Td) vaccine. You may need a Td booster every 10 years.  Zoster vaccine. You may need this after age 66.  Pneumococcal  13-valent conjugate (PCV13) vaccine. One dose is recommended after age 28.  Pneumococcal polysaccharide (PPSV23) vaccine. One dose is recommended after age 74. Talk to your health care provider about which screenings and vaccines you need and how often you need them. This information is not intended to replace advice given to you by your health care provider. Make sure you discuss any questions you have with your health care provider. Document Released: 12/22/2015 Document Revised: 08/14/2016 Document Reviewed: 09/26/2015 Elsevier Interactive Patient Education  2017 [place] [employer] Prevention in the Home Falls can cause injuries. They can happen to people of all ages. There are many things you can do to make your home safe and to help prevent falls. What can I do on the outside of my home?  Regularly fix the edges of walkways and driveways and fix any cracks.  Remove anything that might make you trip as you walk through a door, such as a raised step or threshold.  Trim any bushes or trees on the path to your home.  Use bright outdoor lighting.  Clear any walking paths of anything that might make someone trip, such as rocks or tools.  Regularly check to see if handrails are loose or broken. Make sure that both sides of any steps have handrails.  Any raised decks and porches should have guardrails on the edges.  Have any leaves, snow, or ice cleared regularly.  Use sand or salt on walking paths during winter.  Clean up any spills in your garage right away. This includes oil or grease spills. What can I do in the bathroom?  Use night lights.  Install grab bars by the toilet and in the tub and shower. Do not use towel bars as grab bars.  Use non-skid mats or decals in the tub or shower.  If you need to sit down in the shower, use a plastic, non-slip stool.  Keep the floor dry. Clean up any water that spills on the floor as soon as it happens.  Remove soap buildup in the  tub or shower regularly.  Attach bath mats securely with double-sided non-slip rug tape.  Do not have throw rugs and other things on the floor that can make you trip. What can I do in the bedroom?  Use night lights.  Make sure that you have a light by your bed that is easy to reach.  Do not use any sheets or blankets that are too big for your bed. They should not hang down onto the floor.  Have a firm chair that has side arms. You can use this for support while you get dressed.  Do not have throw rugs and other things on the floor that can make you trip. What can I do in the kitchen?  Clean up any spills right away.  Avoid walking on wet floors.  Keep items that you use a lot in easy-to-reach places.  If you need to reach something above you, use a strong step stool that has a grab bar.  Keep electrical cords out of the way.  Do not use floor polish or wax that makes floors slippery. If you must use wax, use non-skid floor wax.  Do not have  throw rugs and other things on the floor that can make you trip. What can I do with my stairs?  Do not leave any items on the stairs.  Make sure that there are handrails on both sides of the stairs and use them. Fix handrails that are broken or loose. Make sure that handrails are as long as the stairways.  Check any carpeting to make sure that it is firmly attached to the stairs. Fix any carpet that is loose or worn.  Avoid having throw rugs at the top or bottom of the stairs. If you do have throw rugs, attach them to the floor with carpet tape.  Make sure that you have a light switch at the top of the stairs and the bottom of the stairs. If you do not have them, ask someone to add them for you. What else can I do to help prevent falls?  Wear shoes that:  Do not have high heels.  Have rubber bottoms.  Are comfortable and fit you well.  Are closed at the toe. Do not wear sandals.  If you use a stepladder:  Make sure that it  is fully opened. Do not climb a closed stepladder.  Make sure that both sides of the stepladder are locked into place.  Ask someone to hold it for you, if possible.  Clearly mark and make sure that you can see:  Any grab bars or handrails.  First and last steps.  Where the edge of each step is.  Use tools that help you move around (mobility aids) if they are needed. These include:  Canes.  Walkers.  Scooters.  Crutches.  Turn on the lights when you go into a dark area. Replace any light bulbs as soon as they burn out.  Set up your furniture so you have a clear path. Avoid moving your furniture around.  If any of your floors are uneven, fix them.  If there are any pets around you, be aware of where they are.  Review your medicines with your doctor. Some medicines can make you feel dizzy. This can increase your chance of falling. Ask your doctor what other things that you can do to help prevent falls. This information is not intended to replace advice given to you by your health care provider. Make sure you discuss any questions you have with your health care provider. Document Released: 09/21/2009 Document Revised: 05/02/2016 Document Reviewed: 12/30/2014 Elsevier Interactive Patient Education  2017 [employer].

## 2018-12-23 NOTE — Progress Notes (Signed)
Subjective:   Sean Hayden is a 73 y.o. male who presents for Medicare Annual/Subsequent preventive examination.  Review of Systems:  N/A  Cardiac Risk Factors include: advanced age (>72men, >60 women);diabetes mellitus;dyslipidemia;hypertension;male gender;obesity (BMI >30kg/m2)     Objective:    Vitals: BP (!) 128/58 (BP Location: Right Arm)   Pulse 82   Temp 99 F (37.2 C) (Oral)   Ht 5\' 5"  (1.651 m)   Wt 239 lb 12.8 oz (108.8 kg)   BMI 39.90 kg/m   Body mass index is 39.9 kg/m.  Advanced Directives 12/23/2018 12/04/2017 09/29/2017 09/23/2017 09/15/2017 09/15/2017 12/06/2016  Does Patient Have a Medical Advance Directive? Yes Yes No Yes Yes Yes Yes  Type of Paramedic of [place];Living will [place];Living will - [place];Living will Living will;Healthcare Power of Attorney  Does patient want to make changes to medical advance directive? - - - - Yes (ED - Information included in AVS) - -  Copy of [place] in Chart? No - copy requested No - copy requested - - - - No - copy requested    Tobacco Social History   Tobacco Use  Smoking Status Former Smoker  . Years: 45.00  . Types: Cigars  . Last attempt to quit: 04/08/2012  . Years since quitting: 6.7  Smokeless Tobacco Never Used  Tobacco Comment   cigarettes and cigars     Counseling given: Not Answered Comment: cigarettes and cigars   Clinical Intake:  Pre-visit preparation completed: Yes  Pain : No/denies pain Pain Score: 0-No pain    Diabetes:  Is the patient diabetic?  Yes type 2 If diabetic, was a CBG obtained today?  No  Did the patient bring in their glucometer from home?  No  How often do you monitor your CBG's? Every other day.   Financial Strains and Diabetes Management:  Are you having any financial strains with the device, your supplies or your medication? No .  Does the  patient want to be seen by Chronic Care Management for management of their diabetes?  No  Would the patient like to be referred to a Nutritionist or for Diabetic Management?  No   Diabetic Exams:  Diabetic Eye Exam: Completed summer of 2019 per pt. Requested records be sent to our clinic.   Diabetic Foot Exam: Completed 09/08/16. Pt has been advised about the importance in completing this exam. Note made to complete this at next OV.   Nutritional Status: BMI > 30  Obese Nutritional Risks: None   How often do you need to have someone help you when you read instructions, pamphlets, or other written materials from your doctor or pharmacy?: 1 - Never  Interpreter Needed?: No  Information entered by :: [facility], LPN  Past Medical History:  Diagnosis Date  . Aortic stenosis    moderate by 02/2016 echo  . B12 deficiency   . Cancer ([place])    skin cancer - face   . CVA (cerebral vascular accident) ([place])    02/2016 no residual  . Diabetes mellitus without complication (HCC)    Type II  . Diverticulosis   . Dyspnea    with activity  . Gout   . Gouty arthropathy   . Hemorrhoids   . History of kidney stones   . History of right-sided carotid endarterectomy 04/17/12  . Hyperplastic colon polyp   . Hypertension   . MRSA infection   .  Neuropathy   . OSA (obstructive sleep apnea)   . PAF (paroxysmal atrial fibrillation) ([place])   . Personal history of urinary calculi 05/25/2015  . Pneumonia    1990's  . Seizures ([place])    Past Surgical History:  Procedure Laterality Date  . Arm fracture Left   . CAROTID ARTERY ANGIOPLASTY Right 04/17/2012   Dr. Delana Meyer, [facility]  . CAROTID ENDARTERECTOMY Right 04/17/2012   Dr. Delana Meyer, [facility]  . CHOLECYSTECTOMY  2005   lap  . COLONOSCOPY    . CYST EXCISION     "between legs"  . EP IMPLANTABLE DEVICE N/A 02/23/2016   Procedure: Loop Recorder Insertion;  Surgeon: Thompson Grayer, MD;  Location: [facility];  Service: Cardiovascular;  Laterality:  N/A;  . HEMORRHOID SURGERY  2009  . INCISION AND DRAINAGE WOUND WITH NERVE AND ARTERY REPAIR Left 10/04/2017   Procedure: LEFT HAND WOUND EXPLORATION repair as necessary;  Surgeon: Iran Planas, MD;  Location: [facility];  Service: Orthopedics;  Laterality: Left;  . TEE WITHOUT CARDIOVERSION N/A 02/23/2016   Procedure: TRANSESOPHAGEAL ECHOCARDIOGRAM (TEE);  Surgeon: Pixie Casino, MD;  Location: [facility] ENDOSCOPY;  Service: Cardiovascular;  Laterality: N/A;  ALSO GETTING A LOOP   Family History  Problem Relation Age of Onset  . Hodgkin's lymphoma Father   . Diabetes Mother    Social History   Socioeconomic History  . Marital status: Significant Other    Spouse name: Not on file  . Number of children: 2  . Years of education: HS Grad  . Highest education level: 12th grade  Occupational History  . Occupation: Retired  Scientific laboratory technician  . Financial resource strain: Not hard at all  . Food insecurity:    Worry: Never true    Inability: Never true  . Transportation needs:    Medical: No    Non-medical: No  Tobacco Use  . Smoking status: Former Smoker    Years: 45.00    Types: Cigars    Last attempt to quit: 04/08/2012    Years since quitting: 6.7  . Smokeless tobacco: Never Used  . Tobacco comment: cigarettes and cigars  Substance and Sexual Activity  . Alcohol use: No    Alcohol/week: 0.0 standard drinks    Comment: quit 2013  . Drug use: No  . Sexual activity: Not on file  Lifestyle  . Physical activity:    Days per week: 0 days    Minutes per session: 0 min  . Stress: Rather much  Relationships  . Social connections:    Talks on phone: Patient refused    Gets together: Patient refused    Attends religious service: Patient refused    Active member of club or organization: Patient refused    Attends meetings of clubs or organizations: Patient refused    Relationship status: Patient refused  Other Topics Concern  . Not on file  Social History Narrative   Lives at home with  his granddaughter.   Right-handed.   No caffeine use.        Outpatient Encounter Medications as of 12/23/2018  Medication Sig  . acyclovir (ZOVIRAX) 400 MG tablet 1/2 tablet up to 5 times a day for cold sores  . atorvastatin (LIPITOR) 20 MG tablet Take 20 mg by mouth every evening.   . benzocaine (ORAJEL) 10 % mucosal gel Use as directed 1 application in the mouth or throat as needed for mouth pain.  . Cholecalciferol (VITAMIN D PO) Take 1 tablet by mouth daily.  [name]  Docosanol 10 % CREA Apply topically up to 5 times per day as needed until heals  . empagliflozin (JARDIANCE) 25 MG TABS tablet Take 25 mg by mouth daily.  . Flaxseed, Linseed, (FLAXSEED OIL) 1000 MG CAPS Take 1,000 mg by mouth 2 (two) times daily.  . fluticasone (FLONASE) 50 MCG/ACT nasal spray Place 2 sprays into both nostrils daily.  [name] gabapentin (NEURONTIN) 100 MG capsule Take 100 mg by mouth 2 (two) times daily.   . insulin detemir (LEVEMIR) 100 UNIT/ML injection Inject 0.3 mLs (30 Units total) into the skin 2 (two) times daily. (Patient taking differently: Inject 60 Units into the skin 2 (two) times daily. )  . levETIRAcetam (KEPPRA) 500 MG tablet Take 3 tablets (1,500 mg total) by mouth 2 (two) times daily. 3 in the am and 3 at bedtime  . lisinopril (PRINIVIL,ZESTRIL) 2.5 MG tablet Take 1 tablet (2.5 mg total) by mouth daily.  [name] loratadine (CLARITIN) 10 MG tablet Take 10 mg by mouth daily.   . metFORMIN (GLUCOPHAGE) 850 MG tablet Take 850 mg by mouth 2 (two) times daily with a meal.   . Misc Natural Products (OSTEO BI-FLEX TRIPLE STRENGTH PO) Take 1 tablet by mouth 2 (two) times daily.   . montelukast (SINGULAIR) 10 MG tablet TAKE ONE TABLET EVERY DAY FOR CHRONIC COUGH (Patient taking differently: Take 10 mg by mouth as needed. )  . neomycin-polymyxin-hydrocortisone (CORTISPORIN) otic solution Place 4 drops into the left ear 4 (four) times daily. (Patient taking differently: Place 4 drops into the left ear 4 (four) times  daily. As needed)  . Omega-3 Fatty Acids (FISH OIL) 1200 MG CAPS Take 1,200 mg by mouth 2 (two) times daily.   [name] PARoxetine (PAXIL) 20 MG tablet TAKE ONE TABLET EVERY DAY  . rivaroxaban (XARELTO) 20 MG TABS tablet Take 1 tablet (20 mg total) by mouth daily with supper.  . zolpidem (AMBIEN) 10 MG tablet 1/2 TO 1 TABLET BY MOUTH AT BEDTIME   No facility-administered encounter medications on file as of 12/23/2018.     Activities of Daily Living In your present state of health, do you have any difficulty performing the following activities: 12/23/2018  Hearing? N  Vision? Y  Comment Wears eye glasses a daily.   Difficulty concentrating or making decisions? Y  Walking or climbing stairs? Y  Comment Due to back and leg pains.   Dressing or bathing? N  Doing errands, shopping? N  Preparing Food and eating ? N  Using the Toilet? N  In the past six months, have you accidently leaked urine? N  Do you have problems with loss of bowel control? N  Managing your Medications? N  Managing your Finances? N  Housekeeping or managing your Housekeeping? N  Some recent data might be hidden    Patient Care Team: Birdie Sons, MD as PCP - General (Family Medicine) Dew, Erskine Squibb, MD as Referring Physician (Vascular Surgery) Marcial Pacas, MD as Consulting Physician (Neurology) Long, Susette Racer, RN as Registered Nurse Pa, [facility])   Assessment:   This is a routine wellness examination for Sean Hayden.  Exercise Activities and Dietary recommendations Current Exercise Habits: The patient does not participate in regular exercise at present, Exercise limited by: None identified  Goals    . DIET - EAT MORE FRUITS AND VEGETABLES     Recommend to eat 2 servings of fruits and vegetables each a day.     [name] DIET - REDUCE SUGAR INTAKE  Recommend to cut out sugar and any desserts in diet to help aid in weight loss.        Fall Risk Fall Risk  12/23/2018 12/04/2017 12/06/2016 05/03/2016    Falls in the past year? 0 Yes No No  Number falls in past yr: - 1 - -  Injury with Fall? - Yes - -  Comment - open wound on left hand and currently in rehab - -  Follow up - Falls prevention discussed - -   FALL RISK PREVENTION PERTAINING TO THE HOME:  Any stairs in or around the home WITH handrails? No  Home free of loose throw rugs in walkways, pet beds, electrical cords, etc? Yes  Adequate lighting in your home to reduce risk of falls? Yes   ASSISTIVE DEVICES UTILIZED TO PREVENT FALLS:  Life alert? No  Use of a cane, walker or w/c? Yes  Grab bars in the bathroom? Yes  Shower chair or bench in shower? No  Elevated toilet seat or a handicapped toilet? No    TIMED UP AND GO:  Was the test performed? No .     Depression Screen PHQ 2/9 Scores 12/23/2018 12/04/2017 12/06/2016 05/03/2016  PHQ - 2 Score 2 5 1 1   PHQ- 9 Score 6 15 - -    Cognitive Function: Declined today.      6CIT Screen 12/06/2016  What Year? 4 points  What month? 0 points  What time? 0 points  Count back from 20 0 points  Months in reverse 0 points  Repeat phrase 2 points  Total Score 6    Immunization History  Administered Date(s) Administered  . Influenza, High Dose Seasonal PF 08/25/2015, 08/01/2017, 08/19/2018  . Influenza-Unspecified 09/23/2014  . Pneumococcal Conjugate-13 11/01/2014  . Pneumococcal Polysaccharide-23 11/15/2008, 04/27/2012  . Pneumococcal-Unspecified 07/09/2017  . Tdap 04/17/2010, 07/23/2013, 09/15/2017  . Zoster 09/23/2014    Qualifies for Shingles Vaccine? Yes  Zostavax completed 09/23/14. Due for Shingrix. Education has been provided regarding the importance of this vaccine. Pt has been advised to call insurance company to determine out of pocket expense. Advised may also receive vaccine at local pharmacy or Health Dept. Verbalized acceptance and understanding.  Tdap: Up to date  Flu Vaccine: Up to date  Pneumococcal Vaccine: Up to date   Screening Tests Health  Maintenance  Topic Date Due  . FOOT EXAM  09/08/2017  . HEMOGLOBIN A1C  12/09/2018  . OPHTHALMOLOGY EXAM  09/18/2019  . Fecal DNA (Cologuard)  02/20/2021  . TETANUS/TDAP  09/16/2027  . INFLUENZA VACCINE  Completed  . Hepatitis C Screening  Completed  . PNA vac Low Risk Adult  Completed   Cancer Screenings:  Colorectal Screening: Cologuard completed 02/20/18. Repeat every 3 years.  Lung Cancer Screening: (Low Dose CT Chest recommended if Age 14-80 years, 30 pack-year currently smoking OR have quit w/in 15years.) does qualify, however declines order today.    Additional Screening:  Hepatitis C Screening: Up to date  Vision Screening: Recommended annual ophthalmology exams for early detection of glaucoma and other disorders of the eye.  Dental Screening: Recommended annual dental exams for proper oral hygiene  Community Resource Referral:  CRR required this visit?  No        Plan:  I have personally reviewed and addressed the Medicare Annual Wellness questionnaire and have noted the following in the patient's chart:  A. Medical and social history B. Use of alcohol, tobacco or illicit drugs  C. Current medications and supplements D. Functional  ability and status E.  Nutritional status F.  Physical activity G. Advance directives H. List of other physicians I.  Hospitalizations, surgeries, and ER visits in previous 12 months J.  [place] such as hearing and vision if needed, cognitive and depression L. Referrals and appointments - none  In addition, I have reviewed and discussed with patient certain preventive protocols, quality metrics, and best practice recommendations. A written personalized care plan for preventive services as well as general preventive health recommendations were provided to patient.  See attached scanned questionnaire for additional information.   Signed,  Fabio Neighbors, LPN Nurse Health Advisor   Nurse Recommendations: Pt needs  a diabetic foot exam and Hgb A1c checked at next OV.

## 2018-12-30 ENCOUNTER — Other Ambulatory Visit: Payer: Self-pay | Admitting: Family Medicine

## 2018-12-30 DIAGNOSIS — G4700 Insomnia, unspecified: Secondary | ICD-10-CM

## 2019-01-14 ENCOUNTER — Encounter: Payer: Medicare HMO | Admitting: Family Medicine

## 2019-01-14 NOTE — Progress Notes (Deleted)
Patient: Sean Hayden, Male    DOB: 1946/03/27, 73 y.o.   MRN: [id number] Visit Date: 01/14/2019  Today's Provider: Lelon Huh, MD   No chief complaint on file.  Subjective:     Complete Physical Sean Hayden is a 73 y.o. male. He feels {DESC; WELL/FAIRLY WELL/POORLY:18703}. He reports exercising ***. He reports he is sleeping {DESC; WELL/FAIRLY WELL/POORLY:18703}.  -----------------------------------------------------------   Review of Systems  Social History   Socioeconomic History  . Marital status: Significant Other    Spouse name: Not on file  . Number of children: 2  . Years of education: HS Grad  . Highest education level: 12th grade  Occupational History  . Occupation: Retired  Scientific laboratory technician  . Financial resource strain: Not hard at all  . Food insecurity:    Worry: Never true    Inability: Never true  . Transportation needs:    Medical: No    Non-medical: No  Tobacco Use  . Smoking status: Former Smoker    Years: 45.00    Types: Cigars    Last attempt to quit: 04/08/2012    Years since quitting: 6.7  . Smokeless tobacco: Never Used  . Tobacco comment: cigarettes and cigars  Substance and Sexual Activity  . Alcohol use: No    Alcohol/week: 0.0 standard drinks    Comment: quit 2013  . Drug use: No  . Sexual activity: Not on file  Lifestyle  . Physical activity:    Days per week: 0 days    Minutes per session: 0 min  . Stress: Rather much  Relationships  . Social connections:    Talks on phone: Patient refused    Gets together: Patient refused    Attends religious service: Patient refused    Active member of club or organization: Patient refused    Attends meetings of clubs or organizations: Patient refused    Relationship status: Patient refused  . Intimate partner violence:    Fear of current or ex partner: Patient refused    Emotionally abused: Patient refused    Physically abused: Patient refused    Forced sexual activity:  Patient refused  Other Topics Concern  . Not on file  Social History Narrative   Lives at home with his granddaughter.   Right-handed.   No caffeine use.        Past Medical History:  Diagnosis Date  . Aortic stenosis    moderate by 02/2016 echo  . B12 deficiency   . Cancer ([place])    skin cancer - face   . CVA (cerebral vascular accident) ([place])    02/2016 no residual  . Diabetes mellitus without complication (HCC)    Type II  . Diverticulosis   . Dyspnea    with activity  . Gout   . Gouty arthropathy   . Hemorrhoids   . History of kidney stones   . History of right-sided carotid endarterectomy 04/17/12  . Hyperplastic colon polyp   . Hypertension   . MRSA infection   . Neuropathy   . OSA (obstructive sleep apnea)   . PAF (paroxysmal atrial fibrillation) ([place])   . Personal history of urinary calculi 05/25/2015  . Pneumonia    1990's  . Seizures [facility])      Patient Active Problem List   Diagnosis Date Noted  . Cerebrovascular disease 12/29/2017  . Partial seizure ([place]) 02/10/2017  . History of MRSA infection 01/31/2017  . Situational stress 04/16/2016  .  Paroxysmal atrial fibrillation ([place]) 03/26/2016  . Vitamin D deficiency 02/28/2016  . Aortic valve stenosis, congenital   . History of CVA (cerebrovascular accident) without residual deficits 02/22/2016  . Diabetes mellitus with neuropathy ([place]) 02/21/2016  . Hyperlipidemia 02/21/2016  . Hypertension 02/21/2016  . History of right-sided carotid endarterectomy 02/21/2016  . Allergic rhinitis 02/21/2016  . B12 deficiency 12/14/2015  . Arthritis 05/25/2015  . Diverticulosis of colon 05/25/2015  . Edema 05/25/2015  . Obstructive sleep apnea of adult 05/25/2015  . Psoriasis 05/25/2015  . RAD (reactive airway disease) 05/25/2015  . TIA (transient ischemic attack) 05/25/2015  . Carotid artery disease ([place]) 03/10/2012  . Obesity 01/18/2008  . Insomnia 12/09/2006  . Gouty arthritis 02/07/1999    Past Surgical  History:  Procedure Laterality Date  . Arm fracture Left   . CAROTID ARTERY ANGIOPLASTY Right 04/17/2012   Dr. Delana Meyer, [facility]  . CAROTID ENDARTERECTOMY Right 04/17/2012   Dr. Delana Meyer, [facility]  . CHOLECYSTECTOMY  2005   lap  . COLONOSCOPY    . CYST EXCISION     "between legs"  . EP IMPLANTABLE DEVICE N/A 02/23/2016   Procedure: Loop Recorder Insertion;  Surgeon: Thompson Grayer, MD;  Location: [facility];  Service: Cardiovascular;  Laterality: N/A;  . HEMORRHOID SURGERY  2009  . INCISION AND DRAINAGE WOUND WITH NERVE AND ARTERY REPAIR Left 10/04/2017   Procedure: LEFT HAND WOUND EXPLORATION repair as necessary;  Surgeon: Iran Planas, MD;  Location: [facility];  Service: Orthopedics;  Laterality: Left;  . TEE WITHOUT CARDIOVERSION N/A 02/23/2016   Procedure: TRANSESOPHAGEAL ECHOCARDIOGRAM (TEE);  Surgeon: Pixie Casino, MD;  Location: [facility];  Service: Cardiovascular;  Laterality: N/A;  ALSO GETTING A LOOP    His family history includes Diabetes in his mother; Hodgkin's lymphoma in his father.   Current Outpatient Medications:  .  acyclovir (ZOVIRAX) 400 MG tablet, 1/2 tablet up to 5 times a day for cold sores, Disp: 30 tablet, Rfl: 5 .  atorvastatin (LIPITOR) 20 MG tablet, Take 20 mg by mouth every evening. , Disp: , Rfl:  .  benzocaine (ORAJEL) 10 % mucosal gel, Use as directed 1 application in the mouth or throat as needed for mouth pain., Disp: , Rfl:  .  Cholecalciferol (VITAMIN D PO), Take 1 tablet by mouth daily., Disp: , Rfl:  .  Docosanol 10 % CREA, Apply topically up to 5 times per day as needed until heals, Disp: 2 g, Rfl: 3 .  empagliflozin (JARDIANCE) 25 MG TABS tablet, Take 25 mg by mouth daily., Disp: 90 tablet, Rfl: 4 .  Flaxseed, Linseed, (FLAXSEED OIL) 1000 MG CAPS, Take 1,000 mg by mouth 2 (two) times daily., Disp: , Rfl:  .  fluticasone (FLONASE) 50 MCG/ACT nasal spray, Place 2 sprays into both nostrils daily., Disp: 16 g, Rfl: 1 .  gabapentin (NEURONTIN) 100 MG  capsule, Take 100 mg by mouth 2 (two) times daily. , Disp: , Rfl:  .  insulin detemir (LEVEMIR) 100 UNIT/ML injection, Inject 0.3 mLs (30 Units total) into the skin 2 (two) times daily. (Patient taking differently: Inject 60 Units into the skin 2 (two) times daily. ), Disp: 100 mL, Rfl: 1 .  levETIRAcetam (KEPPRA) 500 MG tablet, Take 3 tablets (1,500 mg total) by mouth 2 (two) times daily. 3 in the am and 3 at bedtime, Disp: 180 tablet, Rfl: 8 .  lisinopril (PRINIVIL,ZESTRIL) 2.5 MG tablet, Take 1 tablet (2.5 mg total) by mouth daily., Disp: 30 tablet, Rfl: 2 .  loratadine (CLARITIN) 10 MG tablet, Take 10 mg by mouth daily. , Disp: , Rfl:  .  metFORMIN (GLUCOPHAGE) 850 MG tablet, Take 850 mg by mouth 2 (two) times daily with a meal. , Disp: , Rfl:  .  Misc Natural Products (OSTEO BI-FLEX TRIPLE STRENGTH PO), Take 1 tablet by mouth 2 (two) times daily. , Disp: , Rfl:  .  montelukast (SINGULAIR) 10 MG tablet, TAKE ONE TABLET EVERY DAY FOR CHRONIC COUGH (Patient taking differently: Take 10 mg by mouth as needed. ), Disp: 30 tablet, Rfl: 12 .  neomycin-polymyxin-hydrocortisone (CORTISPORIN) otic solution, Place 4 drops into the left ear 4 (four) times daily. (Patient taking differently: Place 4 drops into the left ear 4 (four) times daily. As needed), Disp: 10 mL, Rfl: 1 .  Omega-3 Fatty Acids (FISH OIL) 1200 MG CAPS, Take 1,200 mg by mouth 2 (two) times daily. , Disp: , Rfl:  .  PARoxetine (PAXIL) 20 MG tablet, TAKE ONE TABLET EVERY DAY, Disp: 30 tablet, Rfl: 12 .  rivaroxaban (XARELTO) 20 MG TABS tablet, Take 1 tablet (20 mg total) by mouth daily with supper., Disp: 10 tablet, Rfl: 0 .  zolpidem (AMBIEN) 10 MG tablet, TAKE 1/2 TO 1 TABLET AT BEDTIME, Disp: 90 tablet, Rfl: 5  Patient Care Team: Birdie Sons, MD as PCP - General (Family Medicine) Dew, Erskine Squibb, MD as Referring Physician (Vascular Surgery) Marcial Pacas, MD as Consulting Physician (Neurology) Long, Susette Racer, RN as Registered Nurse Pa,  [place] (Optometry)     Objective:    Vitals: There were no vitals taken for this visit.  Physical Exam  Activities of Daily Living In your present state of health, do you have any difficulty performing the following activities: 12/23/2018  Hearing? N  Vision? Y  Comment Wears eye glasses a daily.   Difficulty concentrating or making decisions? Y  Walking or climbing stairs? Y  Comment Due to back and leg pains.   Dressing or bathing? N  Doing errands, shopping? N  Preparing Food and eating ? N  Using the Toilet? N  In the past six months, have you accidently leaked urine? N  Do you have problems with loss of bowel control? N  Managing your Medications? N  Managing your Finances? N  Housekeeping or managing your Housekeeping? N  Some recent data might be hidden    Fall Risk Assessment Fall Risk  12/23/2018 12/04/2017 12/06/2016 05/03/2016  Falls in the past year? 0 Yes No No  Number falls in past yr: - 1 - -  Injury with Fall? - Yes - -  Comment - open wound on left hand and currently in rehab - -  Follow up - Falls prevention discussed - -     Depression Screen PHQ 2/9 Scores 12/23/2018 12/04/2017 12/06/2016 05/03/2016  PHQ - 2 Score 2 5 1 1   PHQ- 9 Score 6 15 - -    6CIT Screen 12/06/2016  What Year? 4 points  What month? 0 points  What time? 0 points  Count back from 20 0 points  Months in reverse 0 points  Repeat phrase 2 points  Total Score 6       Assessment & Plan:    Annual Physical Reviewed patient's Family Medical History Reviewed and updated list of patient's medical providers Assessment of cognitive impairment was done Assessed patient's functional ability Established a written schedule for health screening [place] Completed and Reviewed  Exercise Activities and Dietary recommendations  Goals    . DIET - EAT MORE FRUITS AND VEGETABLES     Recommend to eat 2 servings of fruits and vegetables each a day.     [name]  DIET - REDUCE SUGAR INTAKE     Recommend to cut out sugar and any desserts in diet to help aid in weight loss.        Immunization History  Administered Date(s) Administered  . Influenza, High Dose Seasonal PF 08/25/2015, 08/01/2017, 08/19/2018  . Influenza-Unspecified 09/23/2014  . Pneumococcal Conjugate-13 11/01/2014  . Pneumococcal Polysaccharide-23 11/15/2008, 04/27/2012  . Pneumococcal-Unspecified 07/09/2017  . Tdap 04/17/2010, 07/23/2013, 09/15/2017  . Zoster 09/23/2014    Health Maintenance  Topic Date Due  . FOOT EXAM  09/08/2017  . HEMOGLOBIN A1C  12/09/2018  . OPHTHALMOLOGY EXAM  09/18/2019  . Fecal DNA (Cologuard)  02/20/2021  . TETANUS/TDAP  09/16/2027  . INFLUENZA VACCINE  Completed  . Hepatitis C Screening  Completed  . PNA vac Low Risk Adult  Completed     Discussed health benefits of physical activity, and encouraged him to engage in regular exercise appropriate for his age and condition.    ------------------------------------------------------------------------------------------------------------    Lelon Huh, MD  [place]

## 2019-03-02 ENCOUNTER — Encounter: Payer: Medicare HMO | Admitting: Family Medicine

## 2019-04-13 ENCOUNTER — Ambulatory Visit (INDEPENDENT_AMBULATORY_CARE_PROVIDER_SITE_OTHER): Payer: Medicare HMO | Admitting: Neurology

## 2019-04-13 ENCOUNTER — Other Ambulatory Visit: Payer: Self-pay

## 2019-04-13 ENCOUNTER — Encounter: Payer: Self-pay | Admitting: Neurology

## 2019-04-13 DIAGNOSIS — R569 Unspecified convulsions: Secondary | ICD-10-CM

## 2019-04-13 DIAGNOSIS — R202 Paresthesia of skin: Secondary | ICD-10-CM | POA: Diagnosis not present

## 2019-04-13 MED ORDER — LEVETIRACETAM 750 MG PO TABS: 1500.0000 mg | ORAL_TABLET | Freq: Two times a day (BID) | ORAL | 4 refills | Status: DC

## 2019-04-13 NOTE — Progress Notes (Signed)
   [facility]  PATIENT: Sean Hayden DOB: [birth year]  HISTORY:  Sean Hayden is a 73 years old right-handed male, accompanied by his son-in-law, seen in refer by his primary care Dr. Lelon Huh for evaluation of stroke, initial evaluation was November 12 2016  He had a past medical history of diabetes, atrial fibrillation, hyperlipidemia, hypertension, is on chronic anticoagulation Xarelto treatments, he had history of right carotid artery endartectomy, stroke in the past,  He smokes.   On Oct 11, 2016, he had acute onset of left hand numbness, traveling to his left elbow, left shoulder, left upper and lower lip, he had difficulty talking, but denies confusion, difficulty using his left hand, symptoms last about 5 minutes.  He had a very similar episode in March 2017  In between episodes, he denies visual loss, dysarthria, gait abnormality, weakness.  MRI of the brain without contrast in November 2017, no acute abnormality, there was extensive supratentorium white matter disease    He was treated as probable partial seizure, EEG in January 2018 showed background slowing, no evidence of epileptiform discharge, he was tolerating Keppra well, currently taking Keppra 750 mg 2 tablets twice a day,  Virtual Visit via telephone  I connected with Sean Hayden on 04/13/19 at  by telephone and verified that I am speaking with the correct person using two identifiers.   I discussed the limitations, risks, security and privacy concerns of performing an evaluation and management service by telephone and the availability of in person appointments. I also discussed with the patient that there may be a patient responsible charge related to this service. The patient expressed understanding and agreed to proceed.   History of Present Illness: He has few episodes of hand numbness, this time was on right hand, involving all five fingers, mostly first 3 fingers,   He is  overall doing well, knows the dosage of Keppra that he is taking 750 mg 2 tablets twice a day   Observations/Objective: I have reviewed problem lists, medications, allergies.  He is awake alert oriented to history taking care of conversation  Assessment and Plan: Probable partial seizure Intermittent bilateral hands paresthesia  Keep current dose of Keppra 750 mg 2 tablets twice a day  Differentiation diagnoses also include carpal tunnel syndrome, proceed with EMG nerve conduction study   Follow Up Instructions:  EMG nerve conduction study    I discussed the assessment and treatment plan with the patient. The patient was provided an opportunity to ask questions and all were answered. The patient agreed with the plan and demonstrated an understanding of the instructions.   The patient was advised to call back or seek an in-person evaluation if the symptoms worsen or if the condition fails to improve as anticipated.  I provided 21 minutes of non-face-to-face time during this encounter.   Marcial Pacas, MD

## 2019-05-12 ENCOUNTER — Other Ambulatory Visit: Payer: Self-pay

## 2019-05-12 ENCOUNTER — Encounter: Payer: Self-pay | Admitting: Family Medicine

## 2019-05-12 ENCOUNTER — Ambulatory Visit (INDEPENDENT_AMBULATORY_CARE_PROVIDER_SITE_OTHER): Payer: Medicare HMO | Admitting: Family Medicine

## 2019-05-12 VITALS — BP 128/70 | HR 79 | Temp 98.0°F | Ht 65.0 in | Wt 242.0 lb

## 2019-05-12 DIAGNOSIS — B001 Herpesviral vesicular dermatitis: Secondary | ICD-10-CM

## 2019-05-12 DIAGNOSIS — I480 Paroxysmal atrial fibrillation: Secondary | ICD-10-CM

## 2019-05-12 DIAGNOSIS — Q230 Congenital stenosis of aortic valve: Secondary | ICD-10-CM

## 2019-05-12 DIAGNOSIS — E1140 Type 2 diabetes mellitus with diabetic neuropathy, unspecified: Secondary | ICD-10-CM

## 2019-05-12 DIAGNOSIS — E785 Hyperlipidemia, unspecified: Secondary | ICD-10-CM

## 2019-05-12 DIAGNOSIS — I739 Peripheral vascular disease, unspecified: Secondary | ICD-10-CM

## 2019-05-12 DIAGNOSIS — K5790 Diverticulosis of intestine, part unspecified, without perforation or abscess without bleeding: Secondary | ICD-10-CM | POA: Insufficient documentation

## 2019-05-12 DIAGNOSIS — Z8673 Personal history of transient ischemic attack (TIA), and cerebral infarction without residual deficits: Secondary | ICD-10-CM | POA: Diagnosis not present

## 2019-05-12 DIAGNOSIS — I10 Essential (primary) hypertension: Secondary | ICD-10-CM | POA: Diagnosis not present

## 2019-05-12 DIAGNOSIS — E559 Vitamin D deficiency, unspecified: Secondary | ICD-10-CM | POA: Diagnosis not present

## 2019-05-12 DIAGNOSIS — Z0000 Encounter for general adult medical examination without abnormal findings: Secondary | ICD-10-CM

## 2019-05-12 DIAGNOSIS — I779 Disorder of arteries and arterioles, unspecified: Secondary | ICD-10-CM

## 2019-05-12 DIAGNOSIS — Z794 Long term (current) use of insulin: Secondary | ICD-10-CM

## 2019-05-12 DIAGNOSIS — F439 Reaction to severe stress, unspecified: Secondary | ICD-10-CM

## 2019-05-12 MED ORDER — GABAPENTIN 100 MG PO CAPS: 100.0000 mg | ORAL_CAPSULE | Freq: Two times a day (BID) | ORAL | 1 refills | Status: AC

## 2019-05-12 MED ORDER — EMPAGLIFLOZIN 25 MG PO TABS: 25.0000 mg | ORAL_TABLET | Freq: Every day | ORAL | 4 refills | Status: DC

## 2019-05-12 MED ORDER — PAROXETINE HCL 20 MG PO TABS: 20.0000 mg | ORAL_TABLET | Freq: Every day | ORAL | 12 refills | Status: DC

## 2019-05-12 MED ORDER — ATORVASTATIN CALCIUM 20 MG PO TABS: 20.0000 mg | ORAL_TABLET | Freq: Every evening | ORAL | 3 refills | Status: DC

## 2019-05-12 MED ORDER — ACYCLOVIR 400 MG PO TABS: ORAL_TABLET | ORAL | 5 refills | Status: DC

## 2019-05-12 MED ORDER — METFORMIN HCL 850 MG PO TABS: 850.0000 mg | ORAL_TABLET | Freq: Two times a day (BID) | ORAL | 1 refills | Status: AC

## 2019-05-12 NOTE — Patient Instructions (Addendum)
.   Please review the attached list of medications and notify my office if there are any errors.   . Please bring all of your medications to every appointment so we can make sure that our medication list is the same as yours.    Please send a copy of all the labs you have done at the [place] this year  . It is recommended to engage in 150 minutes of moderate exercise every week.    Try OTC Debrox ear drop, or sweet oil ear drop to soften up and clear out the ear wax

## 2019-05-12 NOTE — Progress Notes (Signed)
Patient: Sean Hayden, Male    DOB: [birth year], 73 y.o.   MRN: [id number] Visit Date: 05/12/2019  Today's Provider: Lelon Huh, MD   Chief Complaint  Patient presents with  . Annual Exam   Subjective:     Complete Physical Sean Hayden is a 73 y.o. male. He feels well. He reports exercising some. He reports he is sleeping well.  Pt states he sleeps well with Zolpidem.   -----------------------------------------------------------  Diabetes Mellitus Type II, Follow-up:   Lab Results  Component Value Date   HGBA1C 7.1 (A) 06/08/2018   HGBA1C 9.7 02/02/2018   HGBA1C 7.2 11/06/2017   Last seen for diabetes 11 months ago.  Management since then includes continue the same medications, he has been going to [place] for labs and is planning on having them done this month. . He reports excellent compliance with treatment. He is not having side effects.   Home blood sugar records: fasting range: 120-130  Episodes of hypoglycemia? rarely  Weight trend: stable  Current diet: in general, a "healthy" diet   Current exercise: walking  ------------------------------------------------------------------------   Hypertension, follow-up:  BP Readings from Last 3 Encounters:  05/12/19 128/70  12/23/18 (!) 128/58  10/24/18 124/68    Outside blood pressures are not checked routinely. He is experiencing none.  Patient denies chest pain, dyspnea, exertional chest pressure/discomfort, fatigue, irregular heart beat, palpitations and paroxysmal nocturnal dyspnea.    ------------------------------------------------------------------------    Lipid/Cholesterol, Follow-up:   Continues on atorvastatin which he is tolerating well with no adverse effects. Has medications prescribed and labs monitored at [facility].   Wt Readings from Last 3 Encounters:  05/12/19 242 lb (109.8 kg)  12/23/18 239 lb 12.8 oz (108.8 kg)  10/24/18 244 lb (110.7 kg)     ------------------------------------------------------------------------    Review of Systems  Constitutional: Positive for fatigue. Negative for activity change, appetite change, chills, diaphoresis, fever and unexpected weight change.  HENT: Negative.   Eyes: Negative.   Respiratory: Negative.   Cardiovascular: Negative.   Gastrointestinal: Negative.   Endocrine: Positive for heat intolerance, polydipsia, polyphagia and polyuria. Negative for cold intolerance.  Genitourinary: Positive for frequency. Negative for decreased urine volume, difficulty urinating, discharge, dysuria, enuresis, flank pain, genital sores, hematuria, penile pain, penile swelling, scrotal swelling, testicular pain and urgency.  Musculoskeletal: Positive for back pain. Negative for arthralgias, gait problem, joint swelling, myalgias, neck pain and neck stiffness.  Skin: Positive for wound. Negative for color change, pallor and rash.  Allergic/Immunologic: Negative.   Neurological: Positive for numbness. Negative for dizziness, tremors, seizures, syncope, facial asymmetry, speech difficulty, weakness, light-headedness and headaches.  Hematological: Negative for adenopathy. Bruises/bleeds easily.  Psychiatric/Behavioral: Negative.     Social History   Socioeconomic History  . Marital status: Married    Spouse name: Not on file  . Number of children: 2  . Years of education: HS Grad  . Highest education level: 12th grade  Occupational History  . Occupation: Retired  Scientific laboratory technician  . Financial resource strain: Not hard at all  . Food insecurity:    Worry: Never true    Inability: Never true  . Transportation needs:    Medical: No    Non-medical: No  Tobacco Use  . Smoking status: Former Smoker    Years: 45.00    Types: Cigars    Last attempt to quit: 04/08/2012    Years since quitting: 7.0  . Smokeless tobacco: Never Used  . Tobacco comment: cigarettes  and cigars  Substance and Sexual Activity  .  Alcohol use: No    Alcohol/week: 0.0 standard drinks    Comment: quit 2013  . Drug use: No  . Sexual activity: Not on file  Lifestyle  . Physical activity:    Days per week: 0 days    Minutes per session: 0 min  . Stress: Rather much  Relationships  . Social connections:    Talks on phone: Patient refused    Gets together: Patient refused    Attends religious service: Patient refused    Active member of club or organization: Patient refused    Attends meetings of clubs or organizations: Patient refused    Relationship status: Patient refused  . Intimate partner violence:    Fear of current or ex partner: Patient refused    Emotionally abused: Patient refused    Physically abused: Patient refused    Forced sexual activity: Patient refused  Other Topics Concern  . Not on file  Social History Narrative   Lives at home with his granddaughter.   Right-handed.   No caffeine use.        Past Medical History:  Diagnosis Date  . Aortic stenosis    moderate by 02/2016 echo  . B12 deficiency   . Cancer ([place])    skin cancer - face   . CVA (cerebral vascular accident) ([place])    02/2016 no residual  . Diabetes mellitus without complication (HCC)    Type II  . Diverticulosis   . Dyspnea    with activity  . Gout   . Gouty arthropathy   . Hemorrhoids   . History of kidney stones   . History of right-sided carotid endarterectomy 04/17/12  . Hyperplastic colon polyp   . Hypertension   . MRSA infection   . Neuropathy   . OSA (obstructive sleep apnea)   . PAF (paroxysmal atrial fibrillation) ([place])   . Personal history of urinary calculi 05/25/2015  . Pneumonia    1990's  . Seizures [facility])      Patient Active Problem List   Diagnosis Date Noted  . Paresthesia 04/13/2019  . Cerebrovascular disease 12/29/2017  . Partial seizure ([place]) 02/10/2017  . History of MRSA infection 01/31/2017  . Situational stress 04/16/2016  . Paroxysmal atrial fibrillation ([place]) 03/26/2016  .  Vitamin D deficiency 02/28/2016  . Aortic valve stenosis, congenital   . History of CVA (cerebrovascular accident) without residual deficits 02/22/2016  . Diabetes mellitus with neuropathy ([place]) 02/21/2016  . Hyperlipidemia 02/21/2016  . Hypertension 02/21/2016  . History of right-sided carotid endarterectomy 02/21/2016  . Allergic rhinitis 02/21/2016  . B12 deficiency 12/14/2015  . Arthritis 05/25/2015  . Diverticulosis of colon 05/25/2015  . Edema 05/25/2015  . Obstructive sleep apnea of adult 05/25/2015  . Psoriasis 05/25/2015  . RAD (reactive airway disease) 05/25/2015  . TIA (transient ischemic attack) 05/25/2015  . Carotid artery disease ([place]) 03/10/2012  . Obesity 01/18/2008  . Insomnia 12/09/2006  . Gouty arthritis 02/07/1999    Past Surgical History:  Procedure Laterality Date  . Arm fracture Left   . CAROTID ARTERY ANGIOPLASTY Right 04/17/2012   Dr. Delana Meyer, [facility]  . CAROTID ENDARTERECTOMY Right 04/17/2012   Dr. Delana Meyer, [facility]  . CHOLECYSTECTOMY  2005   lap  . COLONOSCOPY    . CYST EXCISION     "between legs"  . EP IMPLANTABLE DEVICE N/A 02/23/2016   Procedure: Loop Recorder Insertion;  Surgeon: Thompson Grayer, MD;  Location: [facility] LAB;  Service: Cardiovascular;  Laterality: N/A;  . HEMORRHOID SURGERY  2009  . INCISION AND DRAINAGE WOUND WITH NERVE AND ARTERY REPAIR Left 10/04/2017   Procedure: LEFT HAND WOUND EXPLORATION repair as necessary;  Surgeon: Iran Planas, MD;  Location: [facility];  Service: Orthopedics;  Laterality: Left;  . TEE WITHOUT CARDIOVERSION N/A 02/23/2016   Procedure: TRANSESOPHAGEAL ECHOCARDIOGRAM (TEE);  Surgeon: Pixie Casino, MD;  Location[facility] ENDOSCOPY;  Service: Cardiovascular;  Laterality: N/A;  ALSO GETTING A LOOP    His family history includes Diabetes in his mother; Hodgkin's lymphoma in his father.   Current Outpatient Medications:  .  acyclovir (ZOVIRAX) 400 MG tablet, 1/2 tablet up to 5 times a day for cold sores, Disp: 30  tablet, Rfl: 5 .  atorvastatin (LIPITOR) 20 MG tablet, Take 20 mg by mouth every evening. , Disp: , Rfl:  .  benzocaine (ORAJEL) 10 % mucosal gel, Use as directed 1 application in the mouth or throat as needed for mouth pain., Disp: , Rfl:  .  Cholecalciferol (VITAMIN D PO), Take 1 tablet by mouth daily., Disp: , Rfl:  .  empagliflozin (JARDIANCE) 25 MG TABS tablet, Take 25 mg by mouth daily., Disp: 90 tablet, Rfl: 4 .  Flaxseed, Linseed, (FLAXSEED OIL) 1000 MG CAPS, Take 1,000 mg by mouth 2 (two) times daily., Disp: , Rfl:  .  fluticasone (FLONASE) 50 MCG/ACT nasal spray, Place 2 sprays into both nostrils daily., Disp: 16 g, Rfl: 1 .  gabapentin (NEURONTIN) 100 MG capsule, Take 100 mg by mouth 2 (two) times daily. , Disp: , Rfl:  .  insulin detemir (LEVEMIR) 100 UNIT/ML injection, Inject 0.3 mLs (30 Units total) into the skin 2 (two) times daily. (Patient taking differently: Inject 60 Units into the skin 2 (two) times daily. ), Disp: 100 mL, Rfl: 1 .  levETIRAcetam (KEPPRA) 750 MG tablet, Take 2 tablets (1,500 mg total) by mouth 2 (two) times daily. 3 in the am and 3 at bedtime, Disp: 360 tablet, Rfl: 4 .  lisinopril (PRINIVIL,ZESTRIL) 2.5 MG tablet, Take 1 tablet (2.5 mg total) by mouth daily., Disp: 30 tablet, Rfl: 2 .  loratadine (CLARITIN) 10 MG tablet, Take 10 mg by mouth daily. , Disp: , Rfl:  .  metFORMIN (GLUCOPHAGE) 850 MG tablet, Take 850 mg by mouth 2 (two) times daily with a meal. , Disp: , Rfl:  .  Misc Natural Products (OSTEO BI-FLEX TRIPLE STRENGTH PO), Take 1 tablet by mouth 2 (two) times daily. , Disp: , Rfl:  .  montelukast (SINGULAIR) 10 MG tablet, TAKE ONE TABLET EVERY DAY FOR CHRONIC COUGH (Patient taking differently: Take 10 mg by mouth as needed. ), Disp: 30 tablet, Rfl: 12 .  neomycin-polymyxin-hydrocortisone (CORTISPORIN) otic solution, Place 4 drops into the left ear 4 (four) times daily. (Patient taking differently: Place 4 drops into the left ear 4 (four) times daily. As  needed), Disp: 10 mL, Rfl: 1 .  Omega-3 Fatty Acids (FISH OIL) 1200 MG CAPS, Take 1,200 mg by mouth 2 (two) times daily. , Disp: , Rfl:  .  PARoxetine (PAXIL) 20 MG tablet, TAKE ONE TABLET EVERY DAY, Disp: 30 tablet, Rfl: 12 .  rivaroxaban (XARELTO) 20 MG TABS tablet, Take 1 tablet (20 mg total) by mouth daily with supper., Disp: 10 tablet, Rfl: 0 .  zolpidem (AMBIEN) 10 MG tablet, TAKE 1/2 TO 1 TABLET AT BEDTIME, Disp: 90 tablet, Rfl: 5 .  Docosanol 10 % CREA, Apply topically up to 5  times per day as needed until heals, Disp: 2 g, Rfl: 3  Patient Care Team: Birdie Sons, MD as PCP - General (Family Medicine) Dew, Erskine Squibb, MD as Referring Physician (Vascular Surgery) Marcial Pacas, MD as Consulting Physician (Neurology) Long, Susette Racer, RN (Inactive) as Registered Nurse Pa, [place] (Optometry)     Objective:    Vitals: BP 128/70 (BP Location: Right Arm, Patient Position: Sitting, Cuff Size: Normal)   Pulse 79   Temp 98 F (36.7 C) (Oral)   Ht 5\' 5"  (1.651 m)   Wt 242 lb (109.8 kg)   BMI 40.27 kg/m   Physical Exam   General Appearance:    Alert, cooperative, no distress, appears stated age  Head:    Normocephalic, without obvious abnormality, atraumatic  Eyes:    PERRL, conjunctiva/corneas clear, EOM's intact, fundi    benign, both eyes       Ears:    Normal TM's and external ear canals, both ears  Nose:   Nares normal, septum midline, mucosa normal, no drainage   or sinus tenderness  Throat:   Lips, mucosa, and tongue normal; teeth and gums normal  Neck:   Supple, symmetrical, trachea midline, no adenopathy;       thyroid:  No enlargement/tenderness/nodules; no carotid   bruit or JVD  Back:     Symmetric, no curvature, ROM normal, no CVA tenderness  Lungs:     Clear to auscultation bilaterally, respirations unlabored  Chest wall:    No tenderness or deformity  Heart:    Regular rate and rhythm, ii/vi systolic murmur RUSB, rub   or gallop  Abdomen:     Soft,  non-tender, bowel sounds active all four quadrants,    no masses, no organomegaly  Genitalia:    deferred  Rectal:    deferred  Extremities:   Extremities normal, atraumatic, no cyanosis or edema  Pulses:   2+ and symmetric all extremities  Skin:   Skin color, texture, turgor normal, no rashes or lesions  Lymph nodes:   Cervical, supraclavicular, and axillary nodes normal  Neurologic:   CNII-XII intact. Normal strength, sensation and reflexes      throughout    Activities of Daily Living In your present state of health, do you have any difficulty performing the following activities: 12/23/2018  Hearing? N  Vision? Y  Comment Wears eye glasses a daily.   Difficulty concentrating or making decisions? Y  Walking or climbing stairs? Y  Comment Due to back and leg pains.   Dressing or bathing? N  Doing errands, shopping? N  Preparing Food and eating ? N  Using the Toilet? N  In the past six months, have you accidently leaked urine? N  Do you have problems with loss of bowel control? N  Managing your Medications? N  Managing your Finances? N  Housekeeping or managing your Housekeeping? N  Some recent data might be hidden    Fall Risk Assessment Fall Risk  12/23/2018 12/04/2017 12/06/2016 05/03/2016  Falls in the past year? 0 Yes No No  Number falls in past yr: - 1 - -  Injury with Fall? - Yes - -  Comment - open wound on left hand and currently in rehab - -  Follow up - Falls prevention discussed - -     Depression Screen PHQ 2/9 Scores 12/23/2018 12/04/2017 12/06/2016 05/03/2016  PHQ - 2 Score 2 5 1 1   PHQ- 9 Score 6 15 - -  6CIT Screen 12/06/2016  What Year? 4 points  What month? 0 points  What time? 0 points  Count back from 20 0 points  Months in reverse 0 points  Repeat phrase 2 points  Total Score 6       Assessment & Plan:    Annual Physical Reviewed patient's Family Medical History Reviewed and updated list of patient's medical providers Assessment of  cognitive impairment was done Assessed patient's functional ability Established a written schedule for health screening [place] Completed and Reviewed  Exercise Activities and Dietary recommendations Goals    . DIET - EAT MORE FRUITS AND VEGETABLES     Recommend to eat 2 servings of fruits and vegetables each a day.     [name] DIET - REDUCE SUGAR INTAKE     Recommend to cut out sugar and any desserts in diet to help aid in weight loss.        Immunization History  Administered Date(s) Administered  . Influenza, High Dose Seasonal PF 08/25/2015, 08/01/2017, 08/19/2018  . Influenza-Unspecified 09/23/2014  . Pneumococcal Conjugate-13 11/01/2014  . Pneumococcal Polysaccharide-23 11/15/2008, 04/27/2012  . Pneumococcal-Unspecified 07/09/2017  . Tdap 04/17/2010, 07/23/2013, 09/15/2017  . Zoster 09/23/2014    Health Maintenance  Topic Date Due  . FOOT EXAM  09/08/2017  . HEMOGLOBIN A1C  12/09/2018  . INFLUENZA VACCINE  07/10/2019  . OPHTHALMOLOGY EXAM  09/18/2019  . Fecal DNA (Cologuard)  02/20/2021  . TETANUS/TDAP  09/16/2027  . Hepatitis C Screening  Completed  . PNA vac Low Risk Adult  Completed     Discussed health benefits of physical activity, and encouraged him to engage in regular exercise appropriate for his age and condition.    ------------------------------------------------------------------------------------------------------------  1. Annual physical exam Generally doing well. All labs are done at [place]. He is going to bring a copy for me to review after they are done later this month.   2. Type 2 diabetes mellitus with diabetic neuropathy, with long-term current use of insulin (HCC) Continue current medications.    3. Carotid artery disease, unspecified laterality, unspecified type ([place]) Asymptomatic. Compliant with medication.  Continue aggressive risk factor modification.    4. Paroxysmal atrial fibrillation (HCC) Now in [facility], [facility]  5. Hyperlipidemia, unspecified hyperlipidemia type He is tolerating atorvastatin well with no adverse effects.    6. Vitamin D deficiency To be checked at [facility]  7. Essential hypertension Well controlled.  Continue current medications.    8. History of CVA (cerebrovascular accident) without residual deficits Asymptomatic. Compliant with medication.  Continue aggressive risk factor modification.    9. Recurrent cold sores refill- acyclovir (ZOVIRAX) 400 MG tablet; 1/2 tablet up to 5 times a day for cold sores  Dispense: 30 tablet; Refill: 5  10. P. Aortic valve stenosis, congenital Stable, on xarelto  12. Situational stress Doing well on paroxetine.   13. Morbid Obesity Counseled regarding prudent diet and regular exercise.   Refills for meds sent to [facility] to keep on file in case he has trouble getting medications dispensed at [facility].   Lelon Huh, MD  [facility]

## 2019-05-25 ENCOUNTER — Ambulatory Visit: Payer: Medicare HMO | Admitting: Neurology

## 2019-06-04 ENCOUNTER — Other Ambulatory Visit: Payer: Self-pay | Admitting: Family Medicine

## 2019-06-04 ENCOUNTER — Telehealth: Payer: Self-pay | Admitting: Family Medicine

## 2019-06-04 NOTE — Chronic Care Management (AMB) (Signed)
°  Chronic Care Management   Outreach Note  06/04/2019 Name: Sean Hayden MRN: [id number] DOB: 1946/02/25  Referred by: Birdie Sons, MD Reason for referral : Chronic Care Management (Initial CCM outreach was unsuccessful.)   An unsuccessful telephone outreach was attempted today. The patient was referred to the case management team by for assistance with chronic care management and care coordination.   Follow Up Plan: A HIPPA compliant phone message was left for the patient providing contact information and requesting a return call.  The care management team will reach out to the patient again over the next 7 days.  If patient returns call to provider office, please advise to call [place]  at [place]  [email or web address]   ??[phone]

## 2019-06-04 NOTE — Chronic Care Management (AMB) (Signed)
Chronic Care Management   Note  06/04/2019 Name: Sean Hayden MRN: [id number] DOB: 25-Jan-1946  Sean Hayden is a 73 y.o. year old male who is a primary care patient of Caryn Section, Kirstie Peri, MD. I reached out to Sean Hayden by phone today in response to a referral sent by Mr. Camila Li Mahajan's health plan.    [name] was given information about Chronic Care Management services today including:  1. CCM service includes personalized support from designated clinical staff supervised by his physician, including individualized plan of care and coordination with other care providers 2. 24/7 contact phone numbers for assistance for urgent and routine care needs. 3. Service will only be billed when office clinical staff spend 20 minutes or more in a month to coordinate care. 4. Only one practitioner may furnish and bill the service in a calendar month. 5. The patient may stop CCM services at any time (effective at the end of the month) by phone call to the office staff. 6. The patient will be responsible for cost sharing (co-pay) of up to 20% of the service fee (after annual deductible is met).  Patient agreed to services and verbal consent obtained.   Follow up plan: Telephone appointment with CCM team member scheduled for: 06/28/2019  [place]  [email or web address]   ??[phone]

## 2019-06-08 NOTE — Progress Notes (Signed)
Encounter was made in error.  °

## 2019-06-28 ENCOUNTER — Ambulatory Visit (INDEPENDENT_AMBULATORY_CARE_PROVIDER_SITE_OTHER): Payer: Medicare HMO | Admitting: *Deleted

## 2019-06-28 ENCOUNTER — Ambulatory Visit: Payer: Self-pay | Admitting: Pharmacist

## 2019-06-28 ENCOUNTER — Telehealth: Payer: Medicare HMO

## 2019-06-28 DIAGNOSIS — I639 Cerebral infarction, unspecified: Secondary | ICD-10-CM

## 2019-06-28 LAB — CUP PACEART REMOTE DEVICE CHECK
Date Time Interrogation Session: 20200720181955
Implantable Pulse Generator Implant Date: 20170317

## 2019-06-28 NOTE — Chronic Care Management (AMB) (Signed)
  Chronic Care Management   Note  06/28/2019 Name: Sean Hayden MRN: [id number] DOB: [birth year]  73 y.o. year old male referred to Chronic Care Management clinical pharmacy services by patient's health plan. Chronic conditions include T2DM, CAD, afib. Last office visit with Birdie Sons, MD was 05/12/19.   Was unable to reach patient via telephone today and have left HIPAA compliant voicemail asking patient to return my call. (unsuccessful outreach #1).  Follow up plan: A HIPPA compliant phone message was left for the patient providing contact information and requesting a return call.  The care management team will reach out to the patient again over the next 5-7 days.   Ruben Reason, PharmD Clinical Pharmacist [place] [phone]

## 2019-07-05 ENCOUNTER — Telehealth: Payer: Self-pay

## 2019-07-05 ENCOUNTER — Encounter: Payer: Self-pay | Admitting: Pharmacist

## 2019-07-05 ENCOUNTER — Ambulatory Visit: Payer: Self-pay | Admitting: Pharmacist

## 2019-07-05 NOTE — Chronic Care Management (AMB) (Signed)
  Chronic Care Management   Note  07/05/2019 Name: Sean Hayden MRN: [id number] DOB: [birth year]  73 y.o. year old male referred to Chronic Care Management clinical pharmacy services by patient's health plan. Chronic conditions include T2DM, CAD, afib. Last office visit with Birdie Sons, MD was 05/12/19.   Was unable to reach patient via telephone today and have left HIPAA compliant voicemail asking patient to return my call. (unsuccessful outreach #2).  Follow up plan: A HIPPA compliant phone message was left for the patient providing contact information and requesting a return call.  The care management team will reach out to the patient again over the next 5-7 days.   Ruben Reason, PharmD Clinical Pharmacist [place] [phone]

## 2019-07-05 NOTE — Progress Notes (Signed)
This encounter was created in error - please disregard.

## 2019-07-12 ENCOUNTER — Ambulatory Visit: Payer: Self-pay | Admitting: Pharmacist

## 2019-07-12 ENCOUNTER — Telehealth: Payer: Self-pay

## 2019-07-12 NOTE — Chronic Care Management (AMB) (Signed)
  Chronic Care Management   Note  07/12/2019 Name: Sean Hayden MRN: [id number] DOB: [birth year]  73 y.o. year old male referred to Chronic Care Management team for clinical pharmacy services by his health plan. Patient scheduled an appointment with CCM pharmacist for 06/28/19 but did not attend telephone appointment and has not returned any messages left to date.    CCM team services are being closed due to three unsuccessful outreach attempts.   Patient has been provided CCM contact information if he/she wishes to engage with care managers in the future.    Ruben Reason, PharmD Clinical Pharmacist [place] [phone]'

## 2019-07-12 NOTE — Progress Notes (Signed)
Carelink Summary Report / Loop Recorder 

## 2019-07-13 ENCOUNTER — Telehealth: Payer: Self-pay

## 2019-07-13 NOTE — Telephone Encounter (Signed)
Spoke with patient to advise of disconnected monitor. 

## 2019-07-29 ENCOUNTER — Encounter: Payer: Medicare HMO | Admitting: *Deleted

## 2019-08-02 ENCOUNTER — Other Ambulatory Visit: Payer: Self-pay

## 2019-08-12 ENCOUNTER — Other Ambulatory Visit: Payer: Self-pay

## 2019-08-12 ENCOUNTER — Ambulatory Visit (INDEPENDENT_AMBULATORY_CARE_PROVIDER_SITE_OTHER): Payer: Medicare HMO | Admitting: Physician Assistant

## 2019-08-12 DIAGNOSIS — T148XXA Other injury of unspecified body region, initial encounter: Secondary | ICD-10-CM

## 2019-08-12 DIAGNOSIS — Z794 Long term (current) use of insulin: Secondary | ICD-10-CM | POA: Diagnosis not present

## 2019-08-12 DIAGNOSIS — E1140 Type 2 diabetes mellitus with diabetic neuropathy, unspecified: Secondary | ICD-10-CM | POA: Diagnosis not present

## 2019-08-12 DIAGNOSIS — L089 Local infection of the skin and subcutaneous tissue, unspecified: Secondary | ICD-10-CM | POA: Diagnosis not present

## 2019-08-12 MED ORDER — DOXYCYCLINE HYCLATE 100 MG PO TABS: 100.0000 mg | ORAL_TABLET | Freq: Two times a day (BID) | ORAL | 0 refills | Status: AC

## 2019-08-12 NOTE — Patient Instructions (Signed)
Wound Infection A wound infection happens when germs start to grow in a wound. Germs that cause wound infections are most often bacteria. Other types of infections can occur as well. An infection can cause the wound to break open. Wound infections need treatment. If a wound infection is not treated, problems can happen. What are the causes?  Most often caused by germs (bacteria) that grow in a wound.  Other germs, such as yeast and funguses, can also cause wound infections. What increases the risk?  Having a weak body defense system (immune system).  Having diabetes.  Taking certain medicines (steroids) for a long time.  Smoking.  Being an older person.  Being overweight.  Taking certain medicines for cancer treatment. What are the signs or symptoms?  Having more redness, swelling, or pain at the wound site.  Having more blood or fluid at the wound site.  A bad smell coming from a wound or bandage (dressing).  Having a fever.  Feeling very tired.  Having warmth at or around the wound.  Having pus at the wound site. How is this treated?  This condition is most often treated with an antibiotic medicine. ? The infection should improve 24-48 hours after you start antibiotics. ? After 24-48 hours, redness around the wound should stop spreading. The wound should also be less painful. Follow these instructions at home: Medicines  Take or apply over-the-counter and prescription medicines only as told by your doctor.  If you were prescribed an antibiotic medicine, take or apply it as told by your doctor. Do not stop using the antibiotic even if you start to feel better. Wound care   Clean the wound each day, or as told by your doctor. ? Wash the wound with mild soap and water. ? Rinse the wound with water to remove all soap. ? Pat the wound dry with a clean towel. Do not rub it.  Follow instructions from your doctor about how to take care of your wound. Make sure  you: ? Wash your hands with soap and water before and after you change your bandage. If you cannot use soap and water, use hand sanitizer. ? Change your bandage as told by your doctor. ? Leave stitches (sutures), skin glue, or skin tape (adhesive) strips in place if your wound has been closed. They may need to stay in place for 2 weeks or longer. If tape strips get loose and curl up, you may trim the loose edges. Do not remove tape strips completely unless your doctor says it is okay. Some wounds are left open to heal on their own.  Check your wound every day for signs of infection. Watch for: ? More redness, swelling, or pain. ? More fluid or blood. ? Warmth. ? Pus or a bad smell. General instructions  Keep the bandage dry until your doctor says it can be removed.  Do not take baths, swim, or use a hot tub until your doctor approves. Ask your doctor if you may take showers. You may only be allowed to take sponge baths.  Raise (elevate) the injured area above the level of your heart while you are sitting or lying down.  Do not scratch or pick at the wound.  Keep all follow-up visits as told by your doctor. This is important. Contact a doctor if:  Medicine does not help your pain.  You have more redness, swelling, or pain around your wound.  You have more fluid or blood coming from your wound.    Your wound feels warm to the touch.  You have pus coming from your wound.  You notice a bad smell coming from your wound or your bandage.  Your wound that was closed breaks open. Get help right away if:  You have a red streak going away from your wound.  You have a fever. Summary  A wound infection happens when germs start to grow in a wound.  This condition is usually treated with an antibiotic medicine.  Follow instructions from your doctor about how to take care of your wound.  Contact a doctor if your wound infection does not start to get better in 24-48 hours, or your  symptoms get worse.  Keep all follow-up visits as told by your doctor. This is important. This information is not intended to replace advice given to you by your health care provider. Make sure you discuss any questions you have with your health care provider. Document Released: 09/03/2008 Document Revised: 07/07/2018 Document Reviewed: 07/07/2018 Elsevier Patient Education  2020 [employer].

## 2019-08-12 NOTE — Progress Notes (Signed)
Patient: Sean Hayden Male    DOB: [birth year]   73 y.o.   MRN: [id number] Visit Date: 08/12/2019  Today's Provider: Trinna Post, PA-C   Chief Complaint  Patient presents with  . Fall   Subjective:    Patient with history of type II DM presents today with wound infection after a fall. He reports he fell on Sunday after slipping on his socks and falling onto his hardwood floors. He hit his knee and also his head. He did not lose consciousness. He had a tetanus shot last year. He has noticed that there is some increased redness and swelling around the area. He has a history of MRSA.   Fall The accident occurred 3 to 5 days ago. The point of impact was the head and right knee. The patient is experiencing no pain. Treatments tried: neosporin. The treatment provided mild relief.    Allergies  Allergen Reactions  . Actos [Pioglitazone]     UNSPECIFIED REACTION      Current Outpatient Medications:  .  acyclovir (ZOVIRAX) 400 MG tablet, 1/2 tablet up to 5 times a day for cold sores, Disp: 30 tablet, Rfl: 5 .  atorvastatin (LIPITOR) 20 MG tablet, Take 1 tablet (20 mg total) by mouth every evening., Disp: 90 tablet, Rfl: 3 .  benzocaine (ORAJEL) 10 % mucosal gel, Use as directed 1 application in the mouth or throat as needed for mouth pain., Disp: , Rfl:  .  Cholecalciferol (VITAMIN D PO), Take 1 tablet by mouth daily., Disp: , Rfl:  .  Docosanol 10 % CREA, Apply topically up to 5 times per day as needed until heals, Disp: 2 g, Rfl: 3 .  empagliflozin (JARDIANCE) 25 MG TABS tablet, Take 25 mg by mouth daily., Disp: 90 tablet, Rfl: 4 .  Flaxseed, Linseed, (FLAXSEED OIL) 1000 MG CAPS, Take 1,000 mg by mouth 2 (two) times daily., Disp: , Rfl:  .  fluticasone (FLONASE) 50 MCG/ACT nasal spray, Place 2 sprays into both nostrils daily., Disp: 16 g, Rfl: 1 .  gabapentin (NEURONTIN) 100 MG capsule, Take 1 capsule (100 mg total) by mouth 2 (two) times daily., Disp: 180 capsule, Rfl: 1 .   insulin detemir (LEVEMIR) 100 UNIT/ML injection, Inject 0.3 mLs (30 Units total) into the skin 2 (two) times daily. (Patient taking differently: Inject 60 Units into the skin 2 (two) times daily. ), Disp: 100 mL, Rfl: 1 .  levETIRAcetam (KEPPRA) 750 MG tablet, Take 2 tablets (1,500 mg total) by mouth 2 (two) times daily. 3 in the am and 3 at bedtime, Disp: 360 tablet, Rfl: 4 .  lisinopril (PRINIVIL,ZESTRIL) 2.5 MG tablet, Take 1 tablet (2.5 mg total) by mouth daily., Disp: 30 tablet, Rfl: 2 .  loratadine (CLARITIN) 10 MG tablet, Take 10 mg by mouth daily. , Disp: , Rfl:  .  metFORMIN (GLUCOPHAGE) 850 MG tablet, Take 1 tablet (850 mg total) by mouth 2 (two) times daily with a meal., Disp: 180 tablet, Rfl: 1 .  Misc Natural Products (OSTEO BI-FLEX TRIPLE STRENGTH PO), Take 1 tablet by mouth 2 (two) times daily. , Disp: , Rfl:  .  montelukast (SINGULAIR) 10 MG tablet, TAKE ONE TABLET EVERY DAY FOR CHRONIC COUGH (Patient taking differently: Take 10 mg by mouth as needed. ), Disp: 30 tablet, Rfl: 12 .  neomycin-polymyxin-hydrocortisone (CORTISPORIN) otic solution, Place 4 drops into the left ear 4 (four) times daily. (Patient taking differently: Place 4 drops into the left ear  4 (four) times daily. As needed), Disp: 10 mL, Rfl: 1 .  Omega-3 Fatty Acids (FISH OIL) 1200 MG CAPS, Take 1,200 mg by mouth 2 (two) times daily. , Disp: , Rfl:  .  PARoxetine (PAXIL) 20 MG tablet, Take 1 tablet (20 mg total) by mouth daily., Disp: 30 tablet, Rfl: 12 .  PARoxetine (PAXIL) 40 MG tablet, TAKE 1/2 TABLET EVERY DAY, Disp: 15 tablet, Rfl: 12 .  rivaroxaban (XARELTO) 20 MG TABS tablet, Take 1 tablet (20 mg total) by mouth daily with supper., Disp: 10 tablet, Rfl: 0 .  zolpidem (AMBIEN) 10 MG tablet, TAKE 1/2 TO 1 TABLET AT BEDTIME, Disp: 90 tablet, Rfl: 5 .  doxycycline (VIBRA-TABS) 100 MG tablet, Take 1 tablet (100 mg total) by mouth 2 (two) times daily for 7 days., Disp: 14 tablet, Rfl: 0  Review of Systems  Social  History   Tobacco Use  . Smoking status: Former Smoker    Years: 45.00    Types: Cigars    Quit date: 04/08/2012    Years since quitting: 7.3  . Smokeless tobacco: Never Used  . Tobacco comment: cigarettes and cigars  Substance Use Topics  . Alcohol use: No    Alcohol/week: 0.0 standard drinks    Comment: quit 2013      Objective:   There were no vitals taken for this visit. There were no vitals filed for this visit.There is no height or weight on file to calculate BMI.   Physical Exam Constitutional:      Appearance: Normal appearance.  Cardiovascular:     Pulses: Normal pulses.  Pulmonary:     Effort: Pulmonary effort is normal.  Musculoskeletal:       Legs:  Skin:    General: Skin is warm and dry.  Neurological:     Mental Status: He is alert and oriented to person, place, and time. Mental status is at baseline.  Psychiatric:        Mood and Affect: Mood normal.        Behavior: Behavior normal.      No results found for any visits on 08/12/19.     Assessment & Plan    1. Wound infection  Will give doxycycline due to history of MRSA infection.   - doxycycline (VIBRA-TABS) 100 MG tablet; Take 1 tablet (100 mg total) by mouth 2 (two) times daily for 7 days.  Dispense: 14 tablet; Refill: 0  2. Type 2 diabetes mellitus with diabetic neuropathy, with long-term current use of insulin (HCC)  A1c well controlled.  The entirety of the information documented in the History of Present Illness, Review of Systems and Physical Exam were personally obtained by me. Portions of this information were initially documented by Lynford Humphrey, CMA and reviewed by me for thoroughness and accuracy.        Trinna Post, PA-C  [facility]

## 2019-09-21 ENCOUNTER — Telehealth: Payer: Self-pay | Admitting: Family Medicine

## 2019-09-21 NOTE — Telephone Encounter (Signed)
Pt called back for Flu Shot questions.  Please call pt back.  Thanks, [employer]

## 2019-09-21 NOTE — Telephone Encounter (Signed)
Patient was returning call for covid-prescreen. KW

## 2019-09-28 ENCOUNTER — Other Ambulatory Visit: Payer: Self-pay | Admitting: Family Medicine

## 2019-09-28 ENCOUNTER — Ambulatory Visit (INDEPENDENT_AMBULATORY_CARE_PROVIDER_SITE_OTHER): Payer: Medicare HMO

## 2019-09-28 ENCOUNTER — Other Ambulatory Visit: Payer: Self-pay

## 2019-09-28 DIAGNOSIS — G4700 Insomnia, unspecified: Secondary | ICD-10-CM

## 2019-09-28 DIAGNOSIS — Z23 Encounter for immunization: Secondary | ICD-10-CM | POA: Diagnosis not present

## 2019-11-03 ENCOUNTER — Ambulatory Visit (INDEPENDENT_AMBULATORY_CARE_PROVIDER_SITE_OTHER): Payer: Medicare HMO | Admitting: *Deleted

## 2019-11-03 DIAGNOSIS — G459 Transient cerebral ischemic attack, unspecified: Secondary | ICD-10-CM | POA: Diagnosis not present

## 2019-11-07 LAB — CUP PACEART REMOTE DEVICE CHECK
Date Time Interrogation Session: 20201129082613
Implantable Pulse Generator Implant Date: 20170317

## 2019-12-06 ENCOUNTER — Ambulatory Visit (INDEPENDENT_AMBULATORY_CARE_PROVIDER_SITE_OTHER): Payer: Medicare HMO | Admitting: *Deleted

## 2019-12-06 DIAGNOSIS — G459 Transient cerebral ischemic attack, unspecified: Secondary | ICD-10-CM | POA: Diagnosis not present

## 2019-12-07 LAB — CUP PACEART REMOTE DEVICE CHECK
Date Time Interrogation Session: 20201229100821
Implantable Pulse Generator Implant Date: 20170317

## 2019-12-23 NOTE — Progress Notes (Signed)
Subjective:   Sean Hayden is a 74 y.o. male who presents for Medicare Annual/Subsequent preventive examination.    This visit is being conducted through telemedicine due to the COVID-19 pandemic. This patient has given me verbal consent via doximity to conduct this visit, patient states they are participating from their home address. Some vital signs may be absent or patient reported.    Patient identification: identified by name, DOB, and current address  Review of Systems:  N/A  Cardiac Risk Factors include: advanced age (>52men, >53 women);diabetes mellitus;dyslipidemia;hypertension;male gender;obesity (BMI >30kg/m2)     Objective:    Vitals: There were no vitals taken for this visit.  There is no height or weight on file to calculate BMI. Unable to obtain vitals due to visit being conducted via telephonically.   Advanced Directives 12/27/2019 12/23/2018 12/04/2017 09/29/2017 09/23/2017 09/15/2017 09/15/2017  Does Patient Have a Medical Advance Directive? Yes Yes Yes No Yes Yes Yes  Type of Paramedic of [place];Living will [place];Living will [place];Living will - [place];Living will  Does patient want to make changes to medical advance directive? - - - - - Yes (ED - Information included in AVS) -  Copy of [place] in Chart? No - copy requested No - copy requested No - copy requested - - - -    Tobacco Social History   Tobacco Use  Smoking Status Former Smoker  . Years: 45.00  . Types: Cigars  . Quit date: 04/08/2012  . Years since quitting: 7.7  Smokeless Tobacco Never Used  Tobacco Comment   cigarettes and cigars     Counseling given: Not Answered Comment: cigarettes and cigars   Clinical Intake:  Pre-visit preparation completed: Yes  Pain : No/denies pain Pain Score: 0-No pain     Nutritional Risks: None Diabetes: Yes   How often do you need to have someone help you when you read instructions, pamphlets, or other written materials from your doctor or pharmacy?: 1 - Never   Diabetes:  Is the patient diabetic?  Yes  If diabetic, was a CBG obtained today?  No  Did the patient bring in their glucometer from home?  No  How often do you monitor your CBG's? Once every 2-3 days.   Financial Strains and Diabetes Management:  Are you having any financial strains with the device, your supplies or your medication? No .  Does the patient want to be seen by Chronic Care Management for management of their diabetes?  No  Would the patient like to be referred to a Nutritionist or for Diabetic Management?  No   Diabetic Exams:  Diabetic Eye Exam: Completed 09/17/18. Overdue for diabetic eye exam. Pt has been advised about the importance in completing this exam.   Diabetic Foot Exam: Completed 09/08/16. Pt has been advised about the importance in completing this exam. Note made to follow up on this at next in office apt.    Interpreter Needed?: No  Information entered by :: [facility], LPN  Past Medical History:  Diagnosis Date  . Aortic stenosis    moderate by 02/2016 echo  . B12 deficiency   . Cancer ([place])    skin cancer - face   . CVA (cerebral vascular accident) ([place])    02/2016 no residual  . Diabetes mellitus without complication ([place])   . Gout   . Hemorrhoids   . History of kidney  stones   . Hyperlipidemia   . Hyperplastic colon polyp   . Hypertension   . MRSA infection   . Neuropathy   . PAF (paroxysmal atrial fibrillation) ([place])   . Personal history of urinary calculi 05/25/2015  . Pneumonia    1990's  . Seizures ([place])    Past Surgical History:  Procedure Laterality Date  . Arm fracture Left   . CAROTID ARTERY ANGIOPLASTY Right 04/17/2012   Dr. Delana Meyer, [facility]  . CAROTID ENDARTERECTOMY Right 04/17/2012   Dr. Delana Meyer, [facility]  . CHOLECYSTECTOMY  2005   lap  . COLONOSCOPY    . CYST EXCISION      "between legs"  . EP IMPLANTABLE DEVICE N/A 02/23/2016   Procedure: Loop Recorder Insertion;  Surgeon: Thompson Grayer, MD;  Location: [place];  Service: Cardiovascular;  Laterality: N/A;  . HEMORRHOID SURGERY  2009  . INCISION AND DRAINAGE WOUND WITH NERVE AND ARTERY REPAIR Left 10/04/2017   Procedure: LEFT HAND WOUND EXPLORATION repair as necessary;  Surgeon: Iran Planas, MD;  Location: [place];  Service: Orthopedics;  Laterality: Left;  . TEE WITHOUT CARDIOVERSION N/A 02/23/2016   Procedure: TRANSESOPHAGEAL ECHOCARDIOGRAM (TEE);  Surgeon: Pixie Casino, MD;  Location: [facility] ENDOSCOPY;  Service: Cardiovascular;  Laterality: N/A;  ALSO GETTING A LOOP   Family History  Problem Relation Age of Onset  . Hodgkin's lymphoma Father   . Diabetes Mother    Social History   Socioeconomic History  . Marital status: Married    Spouse name: Not on file  . Number of children: 2  . Years of education: HS Grad  . Highest education level: 12th grade  Occupational History  . Occupation: Retired  Tobacco Use  . Smoking status: Former Smoker    Years: 45.00    Types: Cigars    Quit date: 04/08/2012    Years since quitting: 7.7  . Smokeless tobacco: Never Used  . Tobacco comment: cigarettes and cigars  Substance and Sexual Activity  . Alcohol use: No    Alcohol/week: 0.0 standard drinks    Comment: quit 2013  . Drug use: No  . Sexual activity: Not on file  Other Topics Concern  . Not on file  Social History Narrative   Lives at home with his granddaughter.   Right-handed.   No caffeine use.       Social Determinants of Health   Financial Resource Strain: Low Risk   . Difficulty of Paying Living Expenses: Not hard at all  Food Insecurity: No Food Insecurity  . Worried About Charity fundraiser in the Last Year: Never true  . Ran Out of Food in the Last Year: Never true  Transportation Needs: No Transportation Needs  . Lack of Transportation (Medical): No  . Lack of  Transportation (Non-Medical): No  Physical Activity: Inactive  . Days of Exercise per Week: 0 days  . Minutes of Exercise per Session: 0 min  Stress: No Stress Concern Present  . Feeling of Stress : Only a little  Social Connections: Somewhat Isolated  . Frequency of Communication with Friends and Family: More than three times a week  . Frequency of Social Gatherings with Friends and Family: More than three times a week  . Attends Religious Services: Never  . Active Member of Clubs or Organizations: No  . Attends Archivist Meetings: Never  . Marital Status: Married    Outpatient Encounter Medications as of 12/27/2019  Medication Sig  . acyclovir (ZOVIRAX)  400 MG tablet 1/2 tablet up to 5 times a day for cold sores  . atorvastatin (LIPITOR) 20 MG tablet Take 1 tablet (20 mg total) by mouth every evening.  . benzocaine (ORAJEL) 10 % mucosal gel Use as directed 1 application in the mouth or throat as needed for mouth pain.  . Cholecalciferol (VITAMIN D PO) Take 1 tablet by mouth daily.  . Docosanol 10 % CREA Apply topically up to 5 times per day as needed until heals  . empagliflozin (JARDIANCE) 25 MG TABS tablet Take 25 mg by mouth daily.  . Flaxseed, Linseed, (FLAXSEED OIL) 1000 MG CAPS Take 1,000 mg by mouth 2 (two) times daily.  . fluticasone (FLONASE) 50 MCG/ACT nasal spray Place 2 sprays into both nostrils daily. (Patient taking differently: Place 2 sprays into both nostrils 2 (two) times daily. )  . gabapentin (NEURONTIN) 100 MG capsule Take 1 capsule (100 mg total) by mouth 2 (two) times daily.  . insulin detemir (LEVEMIR) 100 UNIT/ML injection Inject 0.3 mLs (30 Units total) into the skin 2 (two) times daily. (Patient taking differently: Inject 60 Units into the skin 2 (two) times daily. )  . levETIRAcetam (KEPPRA) 750 MG tablet Take 2 tablets (1,500 mg total) by mouth 2 (two) times daily. 3 in the am and 3 at bedtime (Patient taking differently: Take 1,500 mg by mouth 2  (two) times daily. )  . lisinopril (PRINIVIL,ZESTRIL) 2.5 MG tablet Take 1 tablet (2.5 mg total) by mouth daily.  [name] loratadine (CLARITIN) 10 MG tablet Take 10 mg by mouth daily.   . metFORMIN (GLUCOPHAGE) 850 MG tablet Take 1 tablet (850 mg total) by mouth 2 (two) times daily with a meal.  . Misc Natural Products (OSTEO BI-FLEX TRIPLE STRENGTH PO) Take 1 tablet by mouth 2 (two) times daily.   . montelukast (SINGULAIR) 10 MG tablet TAKE ONE TABLET EVERY DAY FOR CHRONIC COUGH (Patient taking differently: Take 10 mg by mouth as needed. )  . neomycin-polymyxin-hydrocortisone (CORTISPORIN) otic solution Place 4 drops into the left ear 4 (four) times daily. (Patient taking differently: Place 4 drops into the left ear 4 (four) times daily. As needed)  . Omega-3 Fatty Acids (FISH OIL) 1200 MG CAPS Take 1,200 mg by mouth 2 (two) times daily.   [name] PARoxetine (PAXIL) 40 MG tablet TAKE 1/2 TABLET EVERY DAY (Patient taking differently: Take 20 mg by mouth 2 (two) times daily. )  . rivaroxaban (XARELTO) 20 MG TABS tablet Take 1 tablet (20 mg total) by mouth daily with supper.  . zolpidem (AMBIEN) 10 MG tablet TAKE 1/2 TO 1 TABLET AT BEDTIME  . PARoxetine (PAXIL) 20 MG tablet Take 1 tablet (20 mg total) by mouth daily. (Patient not taking: Reported on 12/27/2019)   No facility-administered encounter medications on file as of 12/27/2019.    Activities of Daily Living In your present state of health, do you have any difficulty performing the following activities: 12/27/2019 05/12/2019  Hearing? N Y  Vision? N N  Difficulty concentrating or making decisions? Y Y  Comment Due to agitation. -  Walking or climbing stairs? Tempie Donning  Comment Due to tiring with climbing stairs. -  Dressing or bathing? N N  Doing errands, shopping? N N  Preparing Food and eating ? N -  Using the Toilet? N -  In the past six months, have you accidently leaked urine? Y -  Comment A couple times a week prior to making it to the bathroom. -  Do you have problems with loss of bowel control? N -  Managing your Medications? N -  Managing your Finances? Y -  Comment Sister in Sports administrator finances. -  Housekeeping or managing your Housekeeping? N -  Some recent data might be hidden    Patient Care Team: Birdie Sons, MD as PCP - General (Family Medicine) Dew, Erskine Squibb, MD as Referring Physician (Vascular Surgery) Marcial Pacas, MD as Consulting Physician (Neurology) [facility], [facility] (Optometry) Cathi Roan, [facility] (Pharmacist) [facility], [place] as Referring Physician ([place]) Laure Kidney, MD as Referring Physician (Internal Medicine)   Assessment:   This is a routine wellness examination for [place].  Exercise Activities and Dietary recommendations Current Exercise Habits: The patient does not participate in regular exercise at present, Exercise limited by: None identified  Goals    . DIET - EAT MORE FRUITS AND VEGETABLES     Recommend to eat 2 servings of fruits and vegetables each a day.     [name] DIET - REDUCE SUGAR INTAKE     Recommend to cut out sugar and any desserts in diet to help aid in weight loss.        Fall Risk Fall Risk  12/27/2019 05/12/2019 12/23/2018 12/04/2017 12/06/2016  Falls in the past year? 1 0 0 Yes No  Number falls in past yr: 0 - - 1 -  Injury with Fall? 0 - - Yes -  Comment - - - open wound on left hand and currently in rehab -  Follow up Falls prevention discussed - - Falls prevention discussed -   FALL RISK PREVENTION PERTAINING TO THE HOME:  Any stairs in or around the home? Yes  If so, are there any without handrails? No   Home free of loose throw rugs in walkways, pet beds, electrical cords, etc? Yes  Adequate lighting in your home to reduce risk of falls? Yes   ASSISTIVE DEVICES UTILIZED TO PREVENT FALLS:  Life alert? No  Use of a cane, walker or w/c? Yes  Grab bars in the bathroom? Yes  Shower chair or bench in shower? No  Elevated toilet seat or a  handicapped toilet? Yes    TIMED UP AND GO:  Was the test performed? No .    Depression Screen PHQ 2/9 Scores 12/27/2019 12/27/2019 05/12/2019 12/23/2018  PHQ - 2 Score 0 0 2 2  PHQ- 9 Score - - 8 6    Cognitive Function: Declined today.      6CIT Screen 12/06/2016  What Year? 4 points  What month? 0 points  What time? 0 points  Count back from 20 0 points  Months in reverse 0 points  Repeat phrase 2 points  Total Score 6    Immunization History  Administered Date(s) Administered  . Fluad Quad(high Dose 65+) 09/28/2019  . Influenza, High Dose Seasonal PF 08/25/2015, 08/01/2017, 08/19/2018  . Influenza-Unspecified 09/23/2014  . Pneumococcal Conjugate-13 11/01/2014  . Pneumococcal Polysaccharide-23 11/15/2008, 04/27/2012  . Pneumococcal-Unspecified 07/09/2017  . Tdap 04/17/2010, 07/23/2013, 09/15/2017  . Zoster 09/23/2014    Qualifies for Shingles Vaccine? Yes  Zostavax completed 09/23/14. Due for Shingrix. Pt has been advised to call insurance company to determine out of pocket expense. Advised may also receive vaccine at local pharmacy or Health Dept. Verbalized acceptance and understanding.  Tdap: Up to date  Flu Vaccine: Up to date  Pneumococcal Vaccine: Completed series  Screening Tests Health Maintenance  Topic Date Due  . FOOT  EXAM  09/08/2017  . HEMOGLOBIN A1C  12/09/2018  . OPHTHALMOLOGY EXAM  09/18/2019  . Fecal DNA (Cologuard)  02/20/2021  . TETANUS/TDAP  09/16/2027  . INFLUENZA VACCINE  Completed  . Hepatitis C Screening  Completed  . PNA vac Low Risk Adult  Completed   Cancer Screenings:  Colorectal Screening: Cologuard completed 02/20/18. Repeat every 3 years.   Lung Cancer Screening: (Low Dose CT Chest recommended if Age 55-80 years, 30 pack-year currently smoking OR have quit w/in 15years.) does qualify however declines order today.   Additional Screening:  Hepatitis C Screening: Up to date  Dental Screening: Recommended annual dental exams  for proper oral hygiene  Community Resource Referral:  CRR required this visit?  No        Plan:  I have personally reviewed and addressed the Medicare Annual Wellness questionnaire and have noted the following in the patient's chart:  A. Medical and social history B. Use of alcohol, tobacco or illicit drugs  C. Current medications and supplements D. Functional ability and status E.  Nutritional status F.  Physical activity G. Advance directives H. List of other physicians I.  Hospitalizations, surgeries, and ER visits in previous 12 months J.  [place] such as hearing and vision if needed, cognitive and depression L. Referrals and appointments   In addition, I have reviewed and discussed with patient certain preventive protocols, quality metrics, and best practice recommendations. A written personalized care plan for preventive services as well as general preventive health recommendations were provided to patient.   Glendora Score, [place]  075-GRM Nurse Health Advisor  Nurse Notes: Pt needs a diabetic foot exam and Hgb A1c check at next in office apt. Pt to set up an eye exam this year.

## 2019-12-27 ENCOUNTER — Ambulatory Visit (INDEPENDENT_AMBULATORY_CARE_PROVIDER_SITE_OTHER): Payer: Medicare HMO

## 2019-12-27 ENCOUNTER — Other Ambulatory Visit: Payer: Self-pay

## 2019-12-27 DIAGNOSIS — Z0000 Encounter for general adult medical examination without abnormal findings: Secondary | ICD-10-CM | POA: Diagnosis not present

## 2019-12-27 NOTE — Patient Instructions (Signed)
Sean Hayden , Thank you for taking time to come for your Medicare Wellness Visit. I appreciate your ongoing commitment to your health goals. Please review the following plan we discussed and let me know if I can assist you in the future.   Screening recommendations/referrals: Colonoscopy: Cologuard up to date, due 02/20/21 Recommended yearly ophthalmology/optometry visit for glaucoma screening and checkup Recommended yearly dental visit for hygiene and checkup  Vaccinations: Influenza vaccine: Up to date Pneumococcal vaccine: Completed series Tdap vaccine: Up to date, due 09/2027 Shingles vaccine: Pt declines today.     Advanced directives: Please bring a copy of your POA (Power of Attorney) and/or Living Will to your next appointment.   Conditions/risks identified: Recommend to continue to cut back on sugar foods and desserts in diet to help aid with weight loss and control diabetes.   Next appointment: 05/16/20 @ 9:00 AM with Dr Caryn Section. Declined scheduling an AWV for 2022 at this time.   Preventive Care 75 Years and Older, Male Preventive care refers to lifestyle choices and visits with your health care provider that can promote health and wellness. What does preventive care include?  A yearly physical exam. This is also called an annual well check.  Dental exams once or twice a year.  Routine eye exams. Ask your health care provider how often you should have your eyes checked.  Personal lifestyle choices, including:  Daily care of your teeth and gums.  Regular physical activity.  Eating a healthy diet.  Avoiding tobacco and drug use.  Limiting alcohol use.  Practicing safe sex.  Taking low doses of aspirin every day.  Taking vitamin and mineral supplements as recommended by your health care provider. What happens during an annual well check? The services and screenings done by your health care provider during your annual well check will depend on your age, overall  health, lifestyle risk factors, and family history of disease. Counseling  Your health care provider may ask you questions about your:  Alcohol use.  Tobacco use.  Drug use.  Emotional well-being.  Home and relationship well-being.  Sexual activity.  Eating habits.  History of falls.  Memory and ability to understand (cognition).  Work and work Statistician. Screening  You may have the following tests or measurements:  Height, weight, and BMI.  Blood pressure.  Lipid and cholesterol levels. These may be checked every 5 years, or more frequently if you are over 82 years old.  Skin check.  Lung cancer screening. You may have this screening every year starting at age 33 if you have a 30-pack-year history of smoking and currently smoke or have quit within the past 15 years.  Fecal occult blood test (FOBT) of the stool. You may have this test every year starting at age 69.  Flexible sigmoidoscopy or colonoscopy. You may have a sigmoidoscopy every 5 years or a colonoscopy every 10 years starting at age 10.  Prostate cancer screening. Recommendations will vary depending on your family history and other risks.  Hepatitis C blood test.  Hepatitis B blood test.  Sexually transmitted disease (STD) testing.  Diabetes screening. This is done by checking your blood sugar (glucose) after you have not eaten for a while (fasting). You may have this done every 1-3 years.  Abdominal aortic aneurysm (AAA) screening. You may need this if you are a current or former smoker.  Osteoporosis. You may be screened starting at age 32 if you are at high risk. Talk with your health care provider  about your test results, treatment options, and if necessary, the need for more tests. Vaccines  Your health care provider may recommend certain vaccines, such as:  Influenza vaccine. This is recommended every year.  Tetanus, diphtheria, and acellular pertussis (Tdap, Td) vaccine. You may need a Td  booster every 10 years.  Zoster vaccine. You may need this after age 88.  Pneumococcal 13-valent conjugate (PCV13) vaccine. One dose is recommended after age 70.  Pneumococcal polysaccharide (PPSV23) vaccine. One dose is recommended after age 38. Talk to your health care provider about which screenings and vaccines you need and how often you need them. This information is not intended to replace advice given to you by your health care provider. Make sure you discuss any questions you have with your health care provider. Document Released: 12/22/2015 Document Revised: 08/14/2016 Document Reviewed: 09/26/2015 Elsevier Interactive Patient Education  2017 [place] Prevention in the Home Falls can cause injuries. They can happen to people of all ages. There are many things you can do to make your home safe and to help prevent falls. What can I do on the outside of my home?  Regularly fix the edges of walkways and driveways and fix any cracks.  Remove anything that might make you trip as you walk through a door, such as a raised step or threshold.  Trim any bushes or trees on the path to your home.  Use bright outdoor lighting.  Clear any walking paths of anything that might make someone trip, such as rocks or tools.  Regularly check to see if handrails are loose or broken. Make sure that both sides of any steps have handrails.  Any raised decks and porches should have guardrails on the edges.  Have any leaves, snow, or ice cleared regularly.  Use sand or salt on walking paths during winter.  Clean up any spills in your garage right away. This includes oil or grease spills. What can I do in the bathroom?  Use night lights.  Install grab bars by the toilet and in the tub and shower. Do not use towel bars as grab bars.  Use non-skid mats or decals in the tub or shower.  If you need to sit down in the shower, use a plastic, non-slip stool.  Keep the floor dry. Clean up  any water that spills on the floor as soon as it happens.  Remove soap buildup in the tub or shower regularly.  Attach bath mats securely with double-sided non-slip rug tape.  Do not have throw rugs and other things on the floor that can make you trip. What can I do in the bedroom?  Use night lights.  Make sure that you have a light by your bed that is easy to reach.  Do not use any sheets or blankets that are too big for your bed. They should not hang down onto the floor.  Have a firm chair that has side arms. You can use this for support while you get dressed.  Do not have throw rugs and other things on the floor that can make you trip. What can I do in the kitchen?  Clean up any spills right away.  Avoid walking on wet floors.  Keep items that you use a lot in easy-to-reach places.  If you need to reach something above you, use a strong step stool that has a grab bar.  Keep electrical cords out of the way.  Do not use floor polish or  wax that makes floors slippery. If you must use wax, use non-skid floor wax.  Do not have throw rugs and other things on the floor that can make you trip. What can I do with my stairs?  Do not leave any items on the stairs.  Make sure that there are handrails on both sides of the stairs and use them. Fix handrails that are broken or loose. Make sure that handrails are as long as the stairways.  Check any carpeting to make sure that it is firmly attached to the stairs. Fix any carpet that is loose or worn.  Avoid having throw rugs at the top or bottom of the stairs. If you do have throw rugs, attach them to the floor with carpet tape.  Make sure that you have a light switch at the top of the stairs and the bottom of the stairs. If you do not have them, ask someone to add them for you. What else can I do to help prevent falls?  Wear shoes that:  Do not have high heels.  Have rubber bottoms.  Are comfortable and fit you well.  Are  closed at the toe. Do not wear sandals.  If you use a stepladder:  Make sure that it is fully opened. Do not climb a closed stepladder.  Make sure that both sides of the stepladder are locked into place.  Ask someone to hold it for you, if possible.  Clearly mark and make sure that you can see:  Any grab bars or handrails.  First and last steps.  Where the edge of each step is.  Use tools that help you move around (mobility aids) if they are needed. These include:  Canes.  Walkers.  Scooters.  Crutches.  Turn on the lights when you go into a dark area. Replace any light bulbs as soon as they burn out.  Set up your furniture so you have a clear path. Avoid moving your furniture around.  If any of your floors are uneven, fix them.  If there are any pets around you, be aware of where they are.  Review your medicines with your doctor. Some medicines can make you feel dizzy. This can increase your chance of falling. Ask your doctor what other things that you can do to help prevent falls. This information is not intended to replace advice given to you by your health care provider. Make sure you discuss any questions you have with your health care provider. Document Released: 09/21/2009 Document Revised: 05/02/2016 Document Reviewed: 12/30/2014 Elsevier Interactive Patient Education  2017 [employer].

## 2020-01-04 ENCOUNTER — Other Ambulatory Visit: Payer: Self-pay | Admitting: Family Medicine

## 2020-01-04 DIAGNOSIS — G4700 Insomnia, unspecified: Secondary | ICD-10-CM

## 2020-01-04 NOTE — Telephone Encounter (Signed)
Requested medication (s) are due for refill today: yes  Requested medication (s) are on the active medication list: yes  Last refill: 09/29/2019  Future visit scheduled: yes  Notes to clinic:  not delegated    Requested Prescriptions  Pending Prescriptions Disp Refills   zolpidem (AMBIEN) 10 MG tablet [Pharmacy Med Name: ZOLPIDEM TARTRATE 10 MG TAB] 90 tablet     Sig: TAKE 1/2 TO 1 TABLET AT BEDTIME      Not Delegated - Psychiatry:  Anxiolytics/Hypnotics Failed - 01/04/2020  1:10 PM      Failed - This refill cannot be delegated      Failed - Urine Drug Screen completed in last 360 days.      Passed - Valid encounter within last 6 months    Recent Outpatient Visits           4 months ago Wound infection   [facility] Carles Collet M, [place]   7 months ago Annual physical exam   [facility] Birdie Sons, MD   1 year ago [facility] [place], Dionne Bucy, MD   1 year ago Cough   [facility] Birdie Sons, MD   1 year ago Abrasion   [facility] Birdie Sons, MD

## 2020-02-07 LAB — HEPATIC FUNCTION PANEL
ALT: 36 (ref 10–40)
AST: 24 (ref 14–40)
Alkaline Phosphatase: 99 (ref 25–125)
Bilirubin, Total: 0.4

## 2020-02-07 LAB — MICROALBUMIN, URINE: Microalb, Ur: 15

## 2020-02-07 LAB — BASIC METABOLIC PANEL
BUN: 13 (ref 4–21)
Creatinine: 0.8 (ref 0.6–1.3)
Glucose: 127
Potassium: 4.3 (ref 3.4–5.3)
Sodium: 139 (ref 137–147)

## 2020-02-07 LAB — LIPID PANEL
Cholesterol: 123 (ref 0–200)
HDL: 37 (ref 35–70)
LDL Cholesterol: 49
Triglycerides: 184 — AB (ref 40–160)

## 2020-02-07 LAB — COMPREHENSIVE METABOLIC PANEL
Calcium: 9.3 (ref 8.7–10.7)
GFR calc non Af Amer: 100.4

## 2020-02-07 LAB — HEMOGLOBIN A1C: Hemoglobin A1C: 7.7

## 2020-02-07 LAB — VITAMIN B12: Vitamin B-12: 310

## 2020-04-06 ENCOUNTER — Encounter: Payer: Self-pay | Admitting: Physician Assistant

## 2020-04-06 ENCOUNTER — Ambulatory Visit (INDEPENDENT_AMBULATORY_CARE_PROVIDER_SITE_OTHER): Payer: Medicare HMO | Admitting: Physician Assistant

## 2020-04-06 ENCOUNTER — Other Ambulatory Visit: Payer: Self-pay

## 2020-04-06 VITALS — BP 156/79 | HR 97 | Temp 96.9°F | Wt 251.2 lb

## 2020-04-06 DIAGNOSIS — Z794 Long term (current) use of insulin: Secondary | ICD-10-CM | POA: Diagnosis not present

## 2020-04-06 DIAGNOSIS — L0291 Cutaneous abscess, unspecified: Secondary | ICD-10-CM

## 2020-04-06 DIAGNOSIS — E1140 Type 2 diabetes mellitus with diabetic neuropathy, unspecified: Secondary | ICD-10-CM

## 2020-04-06 DIAGNOSIS — Z6841 Body Mass Index (BMI) 40.0 and over, adult: Secondary | ICD-10-CM | POA: Diagnosis not present

## 2020-04-06 DIAGNOSIS — E11628 Type 2 diabetes mellitus with other skin complications: Secondary | ICD-10-CM | POA: Diagnosis not present

## 2020-04-06 MED ORDER — DOXYCYCLINE HYCLATE 100 MG PO TABS: 100.0000 mg | ORAL_TABLET | Freq: Two times a day (BID) | ORAL | 0 refills | Status: DC

## 2020-04-06 NOTE — Progress Notes (Signed)
Established patient visit   Patient: Sean Hayden   DOB: 18-Feb-1946   74 y.o. Male  MRN: [id number] Visit Date: 04/06/2020  Today's healthcare provider: Trinna Post, PA-C   Chief Complaint  Patient presents with  . Recurrent Skin Infections   I,Latasha Walston,acting as a scribe for [employer], PA-C.,have documented all relevant documentation on the behalf of Trinna Post, PA-C,as directed by  Trinna Post, PA-C while in the presence of Trinna Post, PA-C.  Subjective    HPI Boil:  Patient wit history of diabetes presents today C/O a boil on right inner upper thigh area and right knee. Patient states the onset was approximately 1 week ago. Area is unchanged. Patient denies pain. He has been trying home remedies and OTC creams with no relief. Denies fever, chills and N/V.     Medications: Outpatient Medications Prior to Visit  Medication Sig  . acyclovir (ZOVIRAX) 400 MG tablet 1/2 tablet up to 5 times a day for cold sores  . atorvastatin (LIPITOR) 20 MG tablet Take 1 tablet (20 mg total) by mouth every evening.  . benzocaine (ORAJEL) 10 % mucosal gel Use as directed 1 application in the mouth or throat as needed for mouth pain.  . Cholecalciferol (VITAMIN D PO) Take 1 tablet by mouth daily.  . Docosanol 10 % CREA Apply topically up to 5 times per day as needed until heals  . empagliflozin (JARDIANCE) 25 MG TABS tablet Take 25 mg by mouth daily.  . Flaxseed, Linseed, (FLAXSEED OIL) 1000 MG CAPS Take 1,000 mg by mouth 2 (two) times daily.  . fluticasone (FLONASE) 50 MCG/ACT nasal spray Place 2 sprays into both nostrils daily. (Patient taking differently: Place 2 sprays into both nostrils 2 (two) times daily. )  . gabapentin (NEURONTIN) 100 MG capsule Take 1 capsule (100 mg total) by mouth 2 (two) times daily.  . insulin detemir (LEVEMIR) 100 UNIT/ML injection Inject 0.3 mLs (30 Units total) into the skin 2 (two) times daily. (Patient taking differently:  Inject 60 Units into the skin 2 (two) times daily. )  . levETIRAcetam (KEPPRA) 750 MG tablet Take 2 tablets (1,500 mg total) by mouth 2 (two) times daily. 3 in the am and 3 at bedtime (Patient taking differently: Take 1,500 mg by mouth 2 (two) times daily. )  . lisinopril (PRINIVIL,ZESTRIL) 2.5 MG tablet Take 1 tablet (2.5 mg total) by mouth daily.  [name] loratadine (CLARITIN) 10 MG tablet Take 10 mg by mouth daily.   . metFORMIN (GLUCOPHAGE) 850 MG tablet Take 1 tablet (850 mg total) by mouth 2 (two) times daily with a meal.  . Misc Natural Products (OSTEO BI-FLEX TRIPLE STRENGTH PO) Take 1 tablet by mouth 2 (two) times daily.   . montelukast (SINGULAIR) 10 MG tablet TAKE ONE TABLET EVERY DAY FOR CHRONIC COUGH (Patient taking differently: Take 10 mg by mouth as needed. )  . neomycin-polymyxin-hydrocortisone (CORTISPORIN) otic solution Place 4 drops into the left ear 4 (four) times daily. (Patient taking differently: Place 4 drops into the left ear 4 (four) times daily. As needed)  . Omega-3 Fatty Acids (FISH OIL) 1200 MG CAPS Take 1,200 mg by mouth 2 (two) times daily.   [name] PARoxetine (PAXIL) 20 MG tablet Take 1 tablet (20 mg total) by mouth daily.  [name] PARoxetine (PAXIL) 40 MG tablet TAKE 1/2 TABLET EVERY DAY (Patient taking differently: Take 20 mg by mouth 2 (two) times daily. )  . rivaroxaban (XARELTO)  20 MG TABS tablet Take 1 tablet (20 mg total) by mouth daily with supper.  . zolpidem (AMBIEN) 10 MG tablet TAKE 1/2 TO 1 TABLET AT BEDTIME   No facility-administered medications prior to visit.    Review of Systems  Respiratory: Negative.   Cardiovascular: Negative.   Musculoskeletal: Negative.   Skin:       Boil       Objective    BP (!) 156/79 (BP Location: Right Arm, Patient Position: Sitting, Cuff Size: Large)   Pulse 97   Temp (!) 96.9 F (36.1 C) (Temporal)   Wt 251 lb 3.2 oz (113.9 kg)   BMI 41.80 kg/m    Physical Exam Constitutional:      Appearance: Normal appearance.   HENT:     Right Ear: Tympanic membrane, ear canal and external ear normal.     Left Ear: Tympanic membrane, ear canal and external ear normal.  Cardiovascular:     Rate and Rhythm: Normal rate and regular rhythm.     Pulses: Normal pulses.     Heart sounds: Normal heart sounds.  Pulmonary:     Effort: Pulmonary effort is normal.     Breath sounds: Normal breath sounds.  Skin:    General: Skin is warm and dry.     Comments: 2 cm abscess on right lateral knee 1/2 cm abscess in right groin area Lesions are minimally fluctuant    Neurological:     General: No focal deficit present.     Mental Status: He is alert and oriented to person, place, and time.  Psychiatric:        Mood and Affect: Mood normal.        Behavior: Behavior normal.     No results found for any visits on 04/06/20.  Assessment & Plan    1. Abscess  - doxycycline (VIBRA-TABS) 100 MG tablet; Take 1 tablet (100 mg total) by mouth 2 (two) times daily.  Dispense: 14 tablet; Refill: 0  2. Type II diabetes  Return if symptoms worsen or fail to improve.      ITrinna Post, PA-C, have reviewed all documentation for this visit. The documentation on 04/06/20 for the exam, diagnosis, procedures, and orders are all accurate and complete.    Paulene Floor  [facility] [phone] (phone) [phone] (fax)  [place]

## 2020-05-15 NOTE — Progress Notes (Signed)
Complete physical exam   Patient: Sean Hayden   DOB: 11/22/1946   74 y.o. Male  MRN: [id number] Visit Date: 05/16/2020  Today's healthcare provider: Lelon Huh, MD   Chief Complaint  Patient presents with  . Annual Exam   Subjective    Sean Hayden is a 74 y.o. male who presents today for a complete physical exam.  He reports consuming a general diet. The patient does not participate in regular exercise at present. He generally feels well. He reports sleeping fairly well. He does not have additional problems to discuss today.   Had AWV with [facility] on 12/27/2019  HPI  Diabetes Mellitus Type II, Follow-up He is seen at [facility] twice yearly where he has a1c checked.  Lab Results  Component Value Date   HGBA1C 7.7 02/07/2020   HGBA1C 7.1 (A) 06/08/2018   HGBA1C 9.7 02/02/2018   Wt Readings from Last 3 Encounters:  05/16/20 252 lb (114.3 kg)  04/06/20 251 lb 3.2 oz (113.9 kg)  05/12/19 242 lb (109.8 kg)   He reports excellent compliance with treatment. He is not having side effects.  Symptoms: No fatigue No foot ulcerations  No appetite changes No nausea  No paresthesia of the feet  No polydipsia  No polyuria No visual disturbances   No vomiting     Home blood sugar records: fasting range: 90-125  Episodes of hypoglycemia? No    Current insulin regiment: 60 units BID Most Recent Eye Exam: not UTD Current exercise: none Current diet habits: well balanced  Pertinent Labs: Lab Results  Component Value Date   CHOL 123 02/07/2020   HDL 37 02/07/2020   LDLCALC 49 02/07/2020   TRIG 184 (A) 02/07/2020   CHOLHDL 3.3 02/21/2016   Lab Results  Component Value Date   NA 139 02/07/2020   K 4.3 02/07/2020   CREATININE 0.8 02/07/2020   GFRNONAA 100.4 02/07/2020   GFRAA >60 09/23/2017   GLUCOSE 282 (H) 09/29/2017     --------------------------------------------------------------------------------------------------- Lipid/Cholesterol, Follow-up  Last lipid  panel Other pertinent labs  Lab Results  Component Value Date   CHOL 123 02/07/2020   HDL 37 02/07/2020   LDLCALC 49 02/07/2020   TRIG 184 (A) 02/07/2020   CHOLHDL 3.3 02/21/2016   Lab Results  Component Value Date   ALT 36 02/07/2020   AST 24 02/07/2020   PLT 196 10/04/2017   TSH 1.82 11/23/2015     He was last seen for this 1 year ago. He did have labs at [facility] in March as above.  Management since that visit includes no changes.  He reports excellent compliance with treatment. He is not having side effects.   Symptoms: No chest pain No chest pressure/discomfort  No dyspnea No lower extremity edema  No numbness or tingling of extremity No orthopnea  No palpitations No paroxysmal nocturnal dyspnea  No speech difficulty No syncope   Current diet: well balanced Current exercise: none  The ASCVD Risk score ([place]., et al., 2013) failed to calculate for the following reasons:   The patient has a prior MI or stroke diagnosis  ---------------------------------------------------------------------------------------------------  Hypertension, follow-up  BP Readings from Last 3 Encounters:  05/16/20 121/69  04/06/20 (!) 156/79  05/12/19 128/70   Wt Readings from Last 3 Encounters:  05/16/20 252 lb (114.3 kg)  04/06/20 251 lb 3.2 oz (113.9 kg)  05/12/19 242 lb (109.8 kg)     He was last seen for hypertension 1 years ago.  BP at that visit was 128/70. Management since that visit includes no changes.  He reports excellent compliance with treatment. He is not having side effects.  He is following a Regular diet. He is not exercising. He does not smoke.  Use of agents associated with hypertension: none.   Outside blood pressures are not being checked. Symptoms: No chest pain No chest pressure  No palpitations No syncope  No dyspnea No orthopnea  No paroxysmal nocturnal dyspnea No lower extremity edema   Pertinent labs: Lab Results  Component Value Date   CHOL  123 02/07/2020   HDL 37 02/07/2020   LDLCALC 49 02/07/2020   TRIG 184 (A) 02/07/2020   CHOLHDL 3.3 02/21/2016   Lab Results  Component Value Date   NA 139 02/07/2020   K 4.3 02/07/2020   CREATININE 0.8 02/07/2020   GFRNONAA 100.4 02/07/2020   GFRAA >60 09/23/2017   GLUCOSE 282 (H) 09/29/2017     The ASCVD Risk score Mikey Bussing DC Jr., et al., 2013) failed to calculate for the following reasons:   The patient has a prior MI or stroke diagnosis   ---------------------------------------------------------------------------------------------------  Past Medical History:  Diagnosis Date  . Aortic stenosis    moderate by 02/2016 echo  . B12 deficiency   . Cancer ([place])    skin cancer - face   . CVA (cerebral vascular accident) ([place])    02/2016 no residual  . Diabetes mellitus without complication ([place])   . Gout   . Hemorrhoids   . History of kidney stones   . Hyperlipidemia   . Hyperplastic colon polyp   . Hypertension   . MRSA infection   . Neuropathy   . PAF (paroxysmal atrial fibrillation) ([place])   . Personal history of urinary calculi 05/25/2015  . Pneumonia    1990's  . Seizures ([place])    Past Surgical History:  Procedure Laterality Date  . Arm fracture Left   . CAROTID ARTERY ANGIOPLASTY Right 04/17/2012   Dr. Delana Meyer, [facility]  . CAROTID ENDARTERECTOMY Right 04/17/2012   Dr. Delana Meyer, [facility]  . CHOLECYSTECTOMY  2005   lap  . COLONOSCOPY    . CYST EXCISION     "between legs"  . EP IMPLANTABLE DEVICE N/A 02/23/2016   Procedure: Loop Recorder Insertion;  Surgeon: Thompson Grayer, MD;  Location: [facility];  Service: Cardiovascular;  Laterality: N/A;  . HEMORRHOID SURGERY  2009  . INCISION AND DRAINAGE WOUND WITH NERVE AND ARTERY REPAIR Left 10/04/2017   Procedure: LEFT HAND WOUND EXPLORATION repair as necessary;  Surgeon: Iran Planas, MD;  Location: [facility];  Service: Orthopedics;  Laterality: Left;  . TEE WITHOUT CARDIOVERSION N/A 02/23/2016   Procedure: TRANSESOPHAGEAL  ECHOCARDIOGRAM (TEE);  Surgeon: Pixie Casino, MD;  Location: [facility] ENDOSCOPY;  Service: Cardiovascular;  Laterality: N/A;  ALSO GETTING A LOOP   Social History   Socioeconomic History  . Marital status: Married    Spouse name: Not on file  . Number of children: 2  . Years of education: HS Grad  . Highest education level: 12th grade  Occupational History  . Occupation: Retired  Tobacco Use  . Smoking status: Former Smoker    Years: 45.00    Types: Cigars    Quit date: 04/08/2012    Years since quitting: 8.1  . Smokeless tobacco: Never Used  . Tobacco comment: cigarettes and cigars  Substance and Sexual Activity  . Alcohol use: No    Alcohol/week: 0.0 standard drinks  Comment: quit 2013  . Drug use: No  . Sexual activity: Not on file  Other Topics Concern  . Not on file  Social History Narrative   Lives at home with his granddaughter.   Right-handed.   No caffeine use.       Social Determinants of Health   Financial Resource Strain: Low Risk   . Difficulty of Paying Living Expenses: Not hard at all  Food Insecurity: No Food Insecurity  . Worried About Charity fundraiser in the Last Year: Never true  . Ran Out of Food in the Last Year: Never true  Transportation Needs: No Transportation Needs  . Lack of Transportation (Medical): No  . Lack of Transportation (Non-Medical): No  Physical Activity: Inactive  . Days of Exercise per Week: 0 days  . Minutes of Exercise per Session: 0 min  Stress: No Stress Concern Present  . Feeling of Stress : Only a little  Social Connections: Somewhat Isolated  . Frequency of Communication with Friends and Family: More than three times a week  . Frequency of Social Gatherings with Friends and Family: More than three times a week  . Attends Religious Services: Never  . Active Member of Clubs or Organizations: No  . Attends Archivist Meetings: Never  . Marital Status: Married  Human resources officer Violence: Not At Risk  . Fear  of Current or Ex-Partner: No  . Emotionally Abused: No  . Physically Abused: No  . Sexually Abused: No   Family Status  Relation Name Status  . Father  Deceased at age 91       Hodgkins  . Mother  Deceased at age 75s       DM, Htn  . Sister  Alive  . Sister  Alive       trouble with knees;Obesity   Family History  Problem Relation Age of Onset  . Hodgkin's lymphoma Father   . Diabetes Mother    Allergies  Allergen Reactions  . Actos [Pioglitazone]     UNSPECIFIED REACTION     Patient Care Team: Birdie Sons, MD as PCP - General (Family Medicine) Lucky Cowboy, Erskine Squibb, MD as Referring Physician (Vascular Surgery) Marcial Pacas, MD as Consulting Physician (Neurology) [facility], [place] (Optometry) Cathi Roan, [facility] (Pharmacist) [facility], [place] as Referring Physician ([place]) Laure Kidney, MD as Referring Physician (Internal Medicine)   Medications: Outpatient Medications Prior to Visit  Medication Sig  . acyclovir (ZOVIRAX) 400 MG tablet 1/2 tablet up to 5 times a day for cold sores  . atorvastatin (LIPITOR) 20 MG tablet Take 1 tablet (20 mg total) by mouth every evening.  . benzocaine (ORAJEL) 10 % mucosal gel Use as directed 1 application in the mouth or throat as needed for mouth pain.  . Cholecalciferol (VITAMIN D PO) Take 1 tablet by mouth daily.  . Docosanol 10 % CREA Apply topically up to 5 times per day as needed until heals  . empagliflozin (JARDIANCE) 25 MG TABS tablet Take 25 mg by mouth daily.  . Flaxseed, Linseed, (FLAXSEED OIL) 1000 MG CAPS Take 1,000 mg by mouth 2 (two) times daily.  . fluticasone (FLONASE) 50 MCG/ACT nasal spray Place 2 sprays into both nostrils daily. (Patient taking differently: Place 2 sprays into both nostrils 2 (two) times daily. )  . gabapentin (NEURONTIN) 100 MG capsule Take 1 capsule (100 mg total) by mouth 2 (two) times daily.  . insulin detemir (LEVEMIR) 100 UNIT/ML injection  Inject 0.3 mLs (30 Units total)  into the skin 2 (two) times daily. (Patient taking differently: Inject 60 Units into the skin 2 (two) times daily. )  . levETIRAcetam (KEPPRA) 750 MG tablet Take 2 tablets (1,500 mg total) by mouth 2 (two) times daily. 3 in the am and 3 at bedtime (Patient taking differently: Take 1,500 mg by mouth 2 (two) times daily. )  . lisinopril (PRINIVIL,ZESTRIL) 2.5 MG tablet Take 1 tablet (2.5 mg total) by mouth daily.  [name] loratadine (CLARITIN) 10 MG tablet Take 10 mg by mouth daily.   . metFORMIN (GLUCOPHAGE) 850 MG tablet Take 1 tablet (850 mg total) by mouth 2 (two) times daily with a meal.  . Misc Natural Products (OSTEO BI-FLEX TRIPLE STRENGTH PO) Take 1 tablet by mouth 2 (two) times daily.   . montelukast (SINGULAIR) 10 MG tablet TAKE ONE TABLET EVERY DAY FOR CHRONIC COUGH (Patient taking differently: Take 10 mg by mouth as needed. )  . neomycin-polymyxin-hydrocortisone (CORTISPORIN) otic solution Place 4 drops into the left ear 4 (four) times daily. (Patient taking differently: Place 4 drops into the left ear 4 (four) times daily. As needed)  . Omega-3 Fatty Acids (FISH OIL) 1200 MG CAPS Take 1,200 mg by mouth 2 (two) times daily.   [name] PARoxetine (PAXIL) 40 MG tablet TAKE 1/2 TABLET EVERY DAY (Patient taking differently: Take 20 mg by mouth daily. )  . rivaroxaban (XARELTO) 20 MG TABS tablet Take 1 tablet (20 mg total) by mouth daily with supper.  . zolpidem (AMBIEN) 10 MG tablet TAKE 1/2 TO 1 TABLET AT BEDTIME  . [DISCONTINUED] doxycycline (VIBRA-TABS) 100 MG tablet Take 1 tablet (100 mg total) by mouth 2 (two) times daily.  . [DISCONTINUED] PARoxetine (PAXIL) 20 MG tablet Take 1 tablet (20 mg total) by mouth daily. (Patient not taking: Reported on 05/16/2020)   No facility-administered medications prior to visit.    Review of Systems  Constitutional: Negative for appetite change, chills, fatigue and fever.  HENT: Negative for congestion, ear pain, hearing loss, nosebleeds and trouble swallowing.    Eyes: Negative for pain and visual disturbance.  Respiratory: Negative for cough, chest tightness, shortness of breath and wheezing.   Cardiovascular: Negative for chest pain, palpitations and leg swelling.  Gastrointestinal: Negative for abdominal pain, blood in stool, constipation, diarrhea, nausea and vomiting.  Endocrine: Positive for polydipsia and polyuria. Negative for polyphagia.  Genitourinary: Positive for frequency. Negative for dysuria and flank pain.  Musculoskeletal: Negative for arthralgias, back pain, joint swelling, myalgias and neck stiffness.  Skin: Positive for rash. Negative for color change and wound.  Neurological: Negative for dizziness, tremors, seizures, speech difficulty, weakness, light-headedness and headaches.  Psychiatric/Behavioral: Negative for behavioral problems, confusion, decreased concentration, dysphoric mood and sleep disturbance. The patient is not nervous/anxious.   All other systems reviewed and are negative.     Objective    BP 121/69 (BP Location: Right Arm, Patient Position: Sitting, Cuff Size: Large)   Pulse 76   Temp (!) 97.5 F (36.4 C) (Temporal)   Resp 16   Ht 5\' 5"  (1.651 m)   Wt 252 lb (114.3 kg)   BMI 41.93 kg/m  BP Readings from Last 3 Encounters:  05/16/20 121/69  04/06/20 (!) 156/79  05/12/19 128/70   Wt Readings from Last 3 Encounters:  05/16/20 252 lb (114.3 kg)  04/06/20 251 lb 3.2 oz (113.9 kg)  05/12/19 242 lb (109.8 kg)      Physical Exam   General Appearance:  Severely obese male. Alert, cooperative, in no acute distress, appears stated age  Head:    Normocephalic, without obvious abnormality, atraumatic  Eyes:    PERRL, conjunctiva/corneas clear, EOM's intact, fundi    benign, both eyes       Ears:    Normal TM's and external ear canals, both ears  Nose:   Nares normal, septum midline, mucosa normal, no drainage   or sinus tenderness  Throat:   Lips, mucosa, and tongue normal; teeth and gums normal   Neck:   Supple, symmetrical, trachea midline, no adenopathy;       thyroid:  No enlargement/tenderness/nodules; no carotid   bruit or JVD  Back:     Symmetric, no curvature, ROM normal, no CVA tenderness  Lungs:     Clear to auscultation bilaterally, respirations unlabored  Chest wall:    No tenderness or deformity  Heart:    Normal heart rate. Regular rhythm.  3/6 harsh, crescendo-decrescendo, systolic murmur at right upper sternal border radiating to right carotid artery radiating to left carotid artery S1 and S2 normal  Abdomen:     Soft, non-tender, bowel sounds active all four quadrants,    no masses, no organomegaly. Diastasis noted.   Genitalia:    deferred  Rectal:    deferred  Extremities:   All extremities are intact. No cyanosis or edema  Pulses:   2+ and symmetric all extremities  Skin:   Skin color, texture, turgor normal, no rashes or lesions  Lymph nodes:   Cervical, supraclavicular, and axillary nodes normal  Neurologic:   CNII-XII intact. Normal strength, sensation and reflexes      throughout     Depression Screen  PHQ 2/9 Scores 12/27/2019 12/27/2019 05/12/2019  PHQ - 2 Score 0 0 2  PHQ- 9 Score - - 8      Assessment & Plan    Routine Health Maintenance and Physical Exam  Exercise Activities and Dietary recommendations Goals    . DIET - EAT MORE FRUITS AND VEGETABLES     Recommend to eat 2 servings of fruits and vegetables each a day.     [name] DIET - REDUCE SUGAR INTAKE     Recommend to cut out sugar and any desserts in diet to help aid in weight loss.        Immunization History  Administered Date(s) Administered  . Fluad Quad(high Dose 65+) 09/28/2019  . Influenza, High Dose Seasonal PF 08/25/2015, 08/01/2017, 08/19/2018  . Influenza-Unspecified 09/23/2014  . PFIZER SARS-COV-2 Vaccination 02/08/2020, 02/29/2020  . Pneumococcal Conjugate-13 11/01/2014  . Pneumococcal Polysaccharide-23 11/15/2008, 04/27/2012, 10/08/2013  . Pneumococcal-Unspecified  07/09/2017  . Tdap 04/17/2010, 07/23/2013, 09/15/2017  . Zoster 09/23/2014    Health Maintenance  Topic Date Due  . FOOT EXAM  09/08/2017  . OPHTHALMOLOGY EXAM  09/18/2019  . INFLUENZA VACCINE  07/09/2020  . HEMOGLOBIN A1C  08/09/2020  . Fecal DNA (Cologuard)  02/20/2021  . TETANUS/TDAP  09/16/2027  . COVID-19 Vaccine  Completed  . Hepatitis C Screening  Completed  . PNA vac Low Risk Adult  Completed    Discussed health benefits of physical activity, and encouraged him to engage in regular exercise appropriate for his age and condition.  1. Annual physical exam   2. Prostate cancer screening  - PSA Total (Reflex To Free) ([facility] only)  3. Essential hypertension Well controlled.  Continue current medications.   - CBC - EKG 12-Lead  4. Carotid artery disease, unspecified laterality, unspecified type ([place]) S/p right  CEA. No asymptomatic.   5. Cerebrovascular disease Asymptomatic. Compliant with medication.  Continue aggressive risk factor modification.    6. Paroxysmal atrial fibrillation (HCC) On Xarelto and tolerating well. Currently in SR.   7. Type 2 diabetes mellitus with diabetic neuropathy, with long-term current use of insulin ([place]) Aci in March noted to be climbing. Recheck today.  - Hemoglobin A1c  8. Morbid obesity ([place]) Encourage healthy diet and regular exercise.   9. B12 deficiency  - CBC  10. Vitamin D deficiency  - VITAMIN D 25 Hydroxy (Vit-D Deficiency, Fractures)  11. Partial seizure ([place]) Continues annual follow up neurology. No recent seizures.   12. Aortic valve stenosis, congenital Currently asymptomatic.   13. Insomnia, unspecified type Doing well with hs zolpidem.   14. Situational stress He is currently taking 1/2 of 40mg  tablet of paroxetine, but he states his wife told him he needs to up to a full tablet.  - PARoxetine (PAXIL) 40 MG tablet; Take 1 tablet (40 mg total) by mouth daily.  Dispense: 90 tablet; Refill: 3  15.  Anemia due to vitamin B12 deficiency, unspecified B12 deficiency type  - Vitamin B12   No follow-ups on file.     The entirety of the information documented in the History of Present Illness, Review of Systems and Physical Exam were personally obtained by me. Portions of this information were initially documented by the CMA and reviewed by me for thoroughness and accuracy.      Lelon Huh, MD  [facility] [phone] (phone) [phone] (fax)  [place]

## 2020-05-16 ENCOUNTER — Other Ambulatory Visit: Payer: Self-pay

## 2020-05-16 ENCOUNTER — Encounter: Payer: Self-pay | Admitting: Family Medicine

## 2020-05-16 ENCOUNTER — Ambulatory Visit (INDEPENDENT_AMBULATORY_CARE_PROVIDER_SITE_OTHER): Payer: Medicare HMO | Admitting: Family Medicine

## 2020-05-16 VITALS — BP 121/69 | HR 76 | Temp 97.5°F | Resp 16 | Ht 65.0 in | Wt 252.0 lb

## 2020-05-16 DIAGNOSIS — R569 Unspecified convulsions: Secondary | ICD-10-CM

## 2020-05-16 DIAGNOSIS — I480 Paroxysmal atrial fibrillation: Secondary | ICD-10-CM | POA: Diagnosis not present

## 2020-05-16 DIAGNOSIS — Z125 Encounter for screening for malignant neoplasm of prostate: Secondary | ICD-10-CM

## 2020-05-16 DIAGNOSIS — I679 Cerebrovascular disease, unspecified: Secondary | ICD-10-CM

## 2020-05-16 DIAGNOSIS — E1140 Type 2 diabetes mellitus with diabetic neuropathy, unspecified: Secondary | ICD-10-CM | POA: Diagnosis not present

## 2020-05-16 DIAGNOSIS — Z794 Long term (current) use of insulin: Secondary | ICD-10-CM | POA: Diagnosis not present

## 2020-05-16 DIAGNOSIS — I779 Disorder of arteries and arterioles, unspecified: Secondary | ICD-10-CM | POA: Diagnosis not present

## 2020-05-16 DIAGNOSIS — I10 Essential (primary) hypertension: Secondary | ICD-10-CM | POA: Diagnosis not present

## 2020-05-16 DIAGNOSIS — D519 Vitamin B12 deficiency anemia, unspecified: Secondary | ICD-10-CM

## 2020-05-16 DIAGNOSIS — Z0000 Encounter for general adult medical examination without abnormal findings: Secondary | ICD-10-CM

## 2020-05-16 DIAGNOSIS — F439 Reaction to severe stress, unspecified: Secondary | ICD-10-CM

## 2020-05-16 DIAGNOSIS — E538 Deficiency of other specified B group vitamins: Secondary | ICD-10-CM

## 2020-05-16 DIAGNOSIS — E1151 Type 2 diabetes mellitus with diabetic peripheral angiopathy without gangrene: Secondary | ICD-10-CM | POA: Diagnosis not present

## 2020-05-16 DIAGNOSIS — E559 Vitamin D deficiency, unspecified: Secondary | ICD-10-CM | POA: Diagnosis not present

## 2020-05-16 DIAGNOSIS — E1159 Type 2 diabetes mellitus with other circulatory complications: Secondary | ICD-10-CM | POA: Diagnosis not present

## 2020-05-16 DIAGNOSIS — Q230 Congenital stenosis of aortic valve: Secondary | ICD-10-CM

## 2020-05-16 DIAGNOSIS — Z6841 Body Mass Index (BMI) 40.0 and over, adult: Secondary | ICD-10-CM | POA: Diagnosis not present

## 2020-05-16 DIAGNOSIS — G4700 Insomnia, unspecified: Secondary | ICD-10-CM

## 2020-05-16 MED ORDER — PAROXETINE HCL 40 MG PO TABS: 40.0000 mg | ORAL_TABLET | Freq: Every day | ORAL | 3 refills | Status: DC

## 2020-05-16 NOTE — Patient Instructions (Addendum)
.   Please review the attached list of medications and notify my office if there are any errors.   . Please bring all of your medications to every appointment so we can make sure that our medication list is the same as yours.   . Please contact your eyecare professional to schedule a routine eye exam   Use OTC Eucerin Cream to help keep your back moisturized.

## 2020-05-17 ENCOUNTER — Telehealth: Payer: Self-pay

## 2020-05-17 LAB — PSA TOTAL (REFLEX TO FREE): Prostate Specific Ag, Serum: 1.5 ng/mL (ref 0.0–4.0)

## 2020-05-17 LAB — CBC
Hematocrit: 46.8 % (ref 37.5–51.0)
Hemoglobin: 15.5 g/dL (ref 13.0–17.7)
MCH: 31.3 pg (ref 26.6–33.0)
MCHC: 33.1 g/dL (ref 31.5–35.7)
MCV: 94 fL (ref 79–97)
Platelets: 180 10*3/uL (ref 150–450)
RBC: 4.96 x10E6/uL (ref 4.14–5.80)
RDW: 12.9 % (ref 11.6–15.4)
WBC: 6.2 10*3/uL (ref 3.4–10.8)

## 2020-05-17 LAB — VITAMIN B12: Vitamin B-12: 246 pg/mL (ref 232–1245)

## 2020-05-17 LAB — VITAMIN D 25 HYDROXY (VIT D DEFICIENCY, FRACTURES): Vit D, 25-Hydroxy: 23.5 ng/mL — ABNORMAL LOW (ref 30.0–100.0)

## 2020-05-17 LAB — HEMOGLOBIN A1C
Est. average glucose Bld gHb Est-mCnc: 171 mg/dL
Hgb A1c MFr Bld: 7.6 % — ABNORMAL HIGH (ref 4.8–5.6)

## 2020-05-17 NOTE — Telephone Encounter (Signed)
Tried calling patient. Call went through but no voicemail recording.  If patient calls [facility] triage  may give results

## 2020-05-17 NOTE — Telephone Encounter (Signed)
-----   Message from Birdie Sons, MD sent at 05/17/2020 10:47 AM EDT ----- A1c is stable at 7.6. vitamin d level is low. If he is taking a vitamin d supplement then he needs to double. If not then he needs to start taking 1000 units per day. He needs to schedule a follow up for diabetes in 4-5 months.

## 2020-05-17 NOTE — Telephone Encounter (Signed)
Pt given results per Dr Caryn Section, "A1c is stable at 7.6. vitamin d level is low. If he is taking a vitamin d supplement then he needs to double. If not then he needs to start taking 1000 units per day. He needs to schedule a follow up for diabetes in 4-5 months"; the pt states that he is taking a Vit D supplement, and will take 1 pill instead of 2; he also states that he will call back to schedule his follow up appt; will route to office for notification.

## 2020-07-28 ENCOUNTER — Telehealth: Payer: Self-pay | Admitting: Family Medicine

## 2020-07-28 NOTE — Telephone Encounter (Signed)
Received fax from [facility] requesting refill 10mg  lisinopril, but I haven't refilled lisinopril since 2017 and that was for 2.5mg . Is he getting lisinopril prescribed from a different provider?

## 2020-07-31 ENCOUNTER — Telehealth: Payer: Self-pay | Admitting: *Deleted

## 2020-07-31 ENCOUNTER — Other Ambulatory Visit: Payer: Self-pay | Admitting: Family Medicine

## 2020-07-31 MED ORDER — LISINOPRIL 2.5 MG PO TABS: 2.5000 mg | ORAL_TABLET | Freq: Every day | ORAL | 2 refills | Status: DC

## 2020-07-31 NOTE — Telephone Encounter (Signed)
Copied from [place] [id number]. Topic: Quick Communication - Rx Refill/Question >> Jul 31, 2020  9:51 AM Leward Quan A wrote: Medication: lisinopril (PRINIVIL,ZESTRIL) 10 MG tablet  Need Rx today please   Has the patient contacted their pharmacy? Yes.   (Agent: If no, request that the patient contact the pharmacy for the refill.) (Agent: If yes, when and what did the pharmacy advise?)  Preferred Pharmacy (with phone number or street name): [place], [place] [facility] [place]  [address] [place], [place] [address]  Phone:  [phone] Fax:  [phone]   Agent: Please be advised that RX refills may take up to 3 business days. We ask that you follow-up with your pharmacy.

## 2020-07-31 NOTE — Telephone Encounter (Signed)
Attempted to reach patient regarding his request for Lisinopril refills. Last ordered from provider was 2017 and was a different dosage than he is requesting. Attempted to reach his [facility]-lines busy.Routed request to provider.

## 2020-07-31 NOTE — Telephone Encounter (Signed)
Left message for patient to call office back for clarification, okay for [facility] triage nurse to speak with patient in regards to providers response below. KW

## 2020-07-31 NOTE — Telephone Encounter (Signed)
Patient requesting prescription for lisinopril to be sent to his local pharmacy [place].  Last order on file for lisinopril was 2.5 mg tabs was 2017. Attempted to reach patient to verify dosage.Left VM. Attempted to reach pharmacy lines were busy several times.  LOV 05/16/20    Note to clinic-There is a note saying he receives some of his medications thru the [place]. Routing his request to the office due to outdated prescription information.

## 2020-08-01 NOTE — Telephone Encounter (Signed)
Phone call placed to pt.  Left vm to call office to discuss one of his medications.

## 2020-08-08 NOTE — Telephone Encounter (Signed)
I called and spoke with patient. He states the [place] has been prescribing this [facility]. [facility] from 2.5mg  to 10mg . [facility] him.

## 2020-09-19 ENCOUNTER — Telehealth: Payer: Self-pay | Admitting: *Deleted

## 2020-09-19 NOTE — Chronic Care Management (AMB) (Signed)
  Chronic Care Management   Outreach Note  09/19/2020 Name: Sean Hayden MRN: [id number] DOB: 12-28-1945  Sean Hayden is a 74 y.o. year old male who is a primary care patient of Caryn Section, Kirstie Peri, MD. I reached out to [name] by phone today in response to a referral sent by [name]'s health plan.     An unsuccessful telephone outreach was attempted today. The patient was referred to the case management team for assistance with care management and care coordination.   Follow Up Plan: A HIPAA compliant phone message was left for the patient providing contact information and requesting a return call. The care management team will reach out to the patient again over the next 7-14 days. If patient returns call to provider office, please advise to call [place] at [phone].  [place] Management  Direct Dial: [phone]

## 2020-09-27 ENCOUNTER — Other Ambulatory Visit: Payer: Self-pay | Admitting: Family Medicine

## 2020-09-27 DIAGNOSIS — G4700 Insomnia, unspecified: Secondary | ICD-10-CM

## 2020-09-27 NOTE — Telephone Encounter (Signed)
Requested medication (s) are due for refill today: yes  Requested medication (s) are on the active medication list: yes   Last refill:  06/30/2020  Future visit scheduled: no  Notes to clinic:  this refill cannot be delegated    Requested Prescriptions  Pending Prescriptions Disp Refills   zolpidem (AMBIEN) 10 MG tablet [Pharmacy Med Name: ZOLPIDEM TARTRATE 10 MG TAB] 90 tablet     Sig: TAKE 1/2 TO 1 TABLET AT BEDTIME      Not Delegated - Psychiatry:  Anxiolytics/Hypnotics Failed - 09/27/2020  1:57 PM      Failed - This refill cannot be delegated      Failed - Urine Drug Screen completed in last 360 days.      Passed - Valid encounter within last 6 months    Recent Outpatient Visits           4 months ago Annual physical exam   [facility] Birdie Sons, MD   5 months ago [facility] [place] Post, [place]   1 year ago Wound infection   [facility], Wendee Beavers, [place]   1 year ago Annual physical exam   [facility] Birdie Sons, MD   1 year ago Abscess   [facility] [place], Dionne Bucy, MD

## 2020-09-28 NOTE — Chronic Care Management (AMB) (Signed)
  Chronic Care Management   Note  09/28/2020 Name: Sean Hayden MRN: [id number] DOB: Feb 14, 1946  [name] is a 74 y.o. year old male who is a primary care patient of Caryn Section, Kirstie Peri, MD. I reached out to [name] by phone today in response to a referral sent by Sean Hayden's health plan.     Sean Hayden was given information about Chronic Care Management services today including:  1. CCM service includes personalized support from designated clinical staff supervised by his physician, including individualized plan of care and coordination with other care providers 2. 24/7 contact phone numbers for assistance for urgent and routine care needs. 3. Service will only be billed when office clinical staff spend 20 minutes or more in a month to coordinate care. 4. Only one practitioner may furnish and bill the service in a calendar month. 5. The patient may stop CCM services at any time (effective at the end of the month) by phone call to the office staff. 6. The patient will be responsible for cost sharing (co-pay) of up to 20% of the service fee (after annual deductible is met).  Patient agreed to services and verbal consent obtained.   Follow up plan: Telephone appointment with care management team member scheduled for: 10/20/2020  [facility]  Direct Dial: [phone]

## 2020-10-11 ENCOUNTER — Other Ambulatory Visit: Payer: Self-pay

## 2020-10-11 ENCOUNTER — Ambulatory Visit (INDEPENDENT_AMBULATORY_CARE_PROVIDER_SITE_OTHER): Payer: Medicare HMO

## 2020-10-11 DIAGNOSIS — Z23 Encounter for immunization: Secondary | ICD-10-CM

## 2020-10-16 NOTE — Chronic Care Management (AMB) (Signed)
Chronic Care Management Pharmacy  Name: Sean Hayden  MRN: [id number] DOB: 05/27/1946   Chief Complaint/ HPI  Sean Hayden,  74 y.o. , male presents for their Initial CCM visit with the clinical pharmacist via telephone.  PCP : Birdie Sons, MD Patient Care Team: Birdie Sons, MD as PCP - General (Family Medicine) Lucky Cowboy Erskine Squibb, MD as Referring Physician (Vascular Surgery) Marcial Pacas, MD as Consulting Physician (Neurology) [facility], [facility] [place] as Referring Physician ([place]) Laure Kidney, MD as Referring Physician (Internal Medicine) Germaine Pomfret, [facility] as Pharmacist (Pharmacist)  Their chronic conditions include: Hypertension, Hyperlipidemia, Diabetes, Atrial Fibrillation and Allergic Rhinitis   Office Visits: 05/16/20: Patient presented to Dr. Caryn Section for follow-up. A1c stable at 7.7%. Paroxetine increased to 40 mg daily. Vitamin D 1000 units daily started.   Consult Visit: None in past 6 months.   Allergies  Allergen Reactions  . Actos [Pioglitazone]     UNSPECIFIED REACTION     Medications: Outpatient Encounter Medications as of 10/20/2020  Medication Sig  . albuterol (VENTOLIN HFA) 108 (90 Base) MCG/ACT inhaler Inhale 2 puffs into the lungs every 6 (six) hours as needed for wheezing or shortness of breath.  [name] atorvastatin (LIPITOR) 20 MG tablet Take 1 tablet (20 mg total) by mouth every evening. (Patient taking differently: Take 40 mg by mouth every evening. )  . Cholecalciferol (VITAMIN D PO) Take 2,000 Units by mouth daily.   . Docosanol 10 % CREA Apply topically up to 5 times per day as needed until heals  . empagliflozin (JARDIANCE) 25 MG TABS tablet Take 25 mg by mouth daily.  . Flaxseed, Linseed, (FLAXSEED OIL) 1000 MG CAPS Take 1,000 mg by mouth 2 (two) times daily.  . fluticasone (FLONASE) 50 MCG/ACT nasal spray Place 2 sprays into both nostrils daily. (Patient taking differently: Place 2  sprays into both nostrils 2 (two) times daily. )  . gabapentin (NEURONTIN) 100 MG capsule Take 1 capsule (100 mg total) by mouth 2 (two) times daily.  . insulin detemir (LEVEMIR) 100 UNIT/ML injection Inject 0.3 mLs (30 Units total) into the skin 2 (two) times daily. (Patient taking differently: Inject 60 Units into the skin 2 (two) times daily. )  . levETIRAcetam (KEPPRA) 750 MG tablet Take 2 tablets (1,500 mg total) by mouth 2 (two) times daily. 3 in the am and 3 at bedtime (Patient taking differently: Take 1,500 mg by mouth 2 (two) times daily. )  . lisinopril (ZESTRIL) 10 MG tablet Take 5 mg by mouth daily.   [name] loratadine (CLARITIN) 10 MG tablet Take 10 mg by mouth daily.   . metFORMIN (GLUCOPHAGE) 850 MG tablet Take 1 tablet (850 mg total) by mouth 2 (two) times daily with a meal.  . Misc Natural Products (OSTEO BI-FLEX TRIPLE STRENGTH PO) Take 1 tablet by mouth 2 (two) times daily.   [name] MISC NATURAL PRODUCTS PO Take 1 capsule by mouth in the morning and at bedtime. Super Beta Prostate  . neomycin-polymyxin-hydrocortisone (CORTISPORIN) otic solution Place 4 drops into the left ear 4 (four) times daily. (Patient taking differently: Place 4 drops into the left ear 4 (four) times daily. As needed)  . Omega-3 Fatty Acids (FISH OIL) 1000 MG CPDR Take 1,200 mg by mouth 2 (two) times daily.   [name] PARoxetine (PAXIL) 40 MG tablet Take 1 tablet (40 mg total) by mouth daily.  . rivaroxaban (XARELTO) 20 MG TABS tablet Take 1 tablet (  20 mg total) by mouth daily with supper.  . zolpidem (AMBIEN) 10 MG tablet TAKE 1/2 TO 1 TABLET AT BEDTIME  . acyclovir (ZOVIRAX) 400 MG tablet 1/2 tablet up to 5 times a day for cold sores (Patient not taking: Reported on 10/20/2020)  . benzocaine (ORAJEL) 10 % mucosal gel Use as directed 1 application in the mouth or throat as needed for mouth pain. (Patient not taking: Reported on 10/20/2020)  . montelukast (SINGULAIR) 10 MG tablet TAKE ONE TABLET EVERY DAY FOR CHRONIC COUGH  (Patient not taking: Reported on 10/20/2020)   No facility-administered encounter medications on file as of 10/20/2020.    Wt Readings from Last 3 Encounters:  05/16/20 252 lb (114.3 kg)  04/06/20 251 lb 3.2 oz (113.9 kg)  05/12/19 242 lb (109.8 kg)    Current Diagnosis/Assessment: SDOH Interventions     Most Recent Value  SDOH Interventions  Financial Strain Interventions Intervention Not Indicated  Transportation Interventions Intervention Not Indicated      Goals Addressed            This Visit's Progress   . Chronic Care Management       CARE PLAN ENTRY (see longitudinal plan of care for additional care plan information)  Current Barriers:  . Chronic Disease Management support, education, and care coordination needs related to Hypertension, Hyperlipidemia, Diabetes, Atrial Fibrillation and Allergic Rhinitis    Hypertension BP Readings from Last 3 Encounters:  05/16/20 121/69  04/06/20 (!) 156/79  05/12/19 128/70   . Pharmacist Clinical Goal(s): o Over the next 90 days, patient will work with PharmD and providers to maintain BP goal <130/80 . Current regimen:  o Lisinopril 10 mg 1/2 tablet daily  . Interventions: o Discussed low salt diet and exercising as tolerated extensively o Will initiate blood pressure monitoring plan  . Patient self care activities - Over the next 90 days, patient will: o Check Blood Pressure Weekly, document, and provide at future appointments o Ensure daily salt intake < 2300 mg/day  Hyperlipidemia Lab Results  Component Value Date/Time   LDLCALC 49 02/07/2020 12:00 AM   . Pharmacist Clinical Goal(s): o Over the next 90 days, patient will work with PharmD and providers to achieve LDL goal < 70 . Current regimen:  o Atorvastatin 40 mg daily  . Interventions: o Discussed low cholesterol diet and exercising as tolerated extensively o Will initiate cholesterol monitoring plan   Diabetes Lab Results  Component Value Date/Time    HGBA1C 7.6 (H) 05/16/2020 10:33 AM   HGBA1C 7.7 02/07/2020 12:00 AM   HGBA1C 7.1 (A) 06/08/2018 08:48 AM   HGBA1C 9.7 02/02/2018 08:46 AM   HGBA1C 7.2 11/06/2017 12:00 AM   . Pharmacist Clinical Goal(s): o Over the next 90 days, patient will work with PharmD and providers to maintain A1c goal <8% . Current regimen:  . Jardiance 25 mg daily . Levemir 60 units twice daily  . Metformin 850 mg twice daily . Interventions: o Discussed carbohydrate counting and exercising as tolerated extensively o Will initiate blood sugar monitoring plan  . Patient self care activities - Over the next 90 days, patient will: o Check blood sugar once daily, document, and provide at future appointments o Contact provider with any episodes of hypoglycemia  Medication management . Pharmacist Clinical Goal(s): o Over the next 90 days, patient will work with PharmD and providers to maintain optimal medication adherence . Current pharmacy: [facility] [place]  . Interventions o Comprehensive medication review performed. o  Continue current medication management strategy . Patient self care activities - Over the next 90 days, patient will: o Take medications as prescribed o Report any questions or concerns to PharmD and/or provider(s)    . DIET - EAT MORE FRUITS AND VEGETABLES   Not on track    Recommend to eat 2 servings of fruits and vegetables each a day.     [name] DIET - REDUCE SUGAR INTAKE   Not on track    Recommend to cut out sugar and any desserts in diet to help aid in weight loss.        AFIB   Patient is currently rate controlled.  Patient has failed these meds in past: n/a Patient is currently controlled on the following medications:  . Xarelto 20 mg daily   We discussed: Denies unusual bruising/bleeding, but does endorse that he bleed easier and for longer now. Gets through the [place].   Plan  Continue current medications  Hypertension   BP goal is:  <130/80  Office blood  pressures are  BP Readings from Last 3 Encounters:  05/16/20 121/69  04/06/20 (!) 156/79  05/12/19 128/70   Lab Results  Component Value Date   CREATININE 0.8 02/07/2020   BUN 13 02/07/2020   GFRNONAA 100.4 02/07/2020   GFRAA >60 09/23/2017   NA 139 02/07/2020   K 4.3 02/07/2020   CALCIUM 9.3 02/07/2020   CO2 24 09/23/2017   Patient checks BP at home infrequently Patient home BP readings are ranging: n/a  Patient has failed these meds in the past: n/a Patient is currently controlled on the following medications:  . Lisinopril 10 mg 1/2 tablet daily    We discussed diet and exercise extensively  Plan  Continue current medications   Hyperlipidemia   LDL goal < 70  Last lipids Lab Results  Component Value Date   CHOL 123 02/07/2020   HDL 37 02/07/2020   LDLCALC 49 02/07/2020   TRIG 184 (A) 02/07/2020   CHOLHDL 3.3 02/21/2016   Hepatic Function Latest Ref Rng & Units 02/07/2020 09/23/2017 01/23/2017  Total Protein 6.5 - 8.1 g/dL - 7.5 -  Albumin 3.5 - 5.0 g/dL - 3.9 -  AST 14 - 40 _0 ALT 10 - 40 36 20 20  Alk Phosphatase 25 - 125 99 76 -  Total Bilirubin 0.3 - 1.2 mg/dL - 0.4 -     The ASCVD Risk score (Crenshaw., et al., 2013) failed to calculate for the following reasons:   The patient has a prior MI or stroke diagnosis   Patient has failed these meds in past: n/a Patient is currently controlled on the following medications:  . Atorvastatin 40 mg daily . Fish oil 1200 mg twice daily  We discussed:  diet and exercise extensively  Plan  Continue current medications  Diabetes   A1c goal <8%  Recent Relevant Labs: Lab Results  Component Value Date/Time   HGBA1C 7.6 (H) 05/16/2020 10:33 AM   HGBA1C 7.7 02/07/2020 12:00 AM   HGBA1C 7.1 (A) 06/08/2018 08:48 AM   HGBA1C 9.7 02/02/2018 08:46 AM   HGBA1C 7.2 11/06/2017 12:00 AM   MICROALBUR 15 02/07/2020 12:00 AM   MICROALBUR 20 02/02/2018 08:46 AM    Last diabetic Eye exam:  Lab Results   Component Value Date/Time   HMDIABEYEEXA Retinopathy (A) 02/24/2017 12:00 AM    Last diabetic Foot exam: No results found for: HMDIABFOOTEX   Checking BG: Daily  Recent FBG Readings:  100-120 Recent pre-meal BG readings: n/a Recent 2hr PP BG readings:  n/a Recent HS BG readings: n/a  Patient has failed these meds in past: n/a Patient is currently uncontrolled on the following medications: . Jardiance 25 mg daily . Levemir 60 units twice daily  . Metformin 850 mg twice daily   We discussed: diet and exercise extensively  Endorses polydipsia and polyuria. He typically drinks 5-6 20 oz. Bottles of water throughout the day, which in turn make him have to frequently urinate throughout the day and night.   Eating more salads.   24-Hour Recall includes:  Breakfast: Rice Krispies Cereal  Lunch: Banana sandwich + Twinkie   Dinner: [employer] Jethro Bolus + Lucendia Herrlich)   Plan  Continue current medications   Allergic Rhinitis   Patient has failed these meds in past: n/a Patient is currently controlled on the following medications:  . Flonase 50 mcg/act 2 spray daily  . Loratadine 10 mg daily  . Montelukast 10 mg PRN (not taking)   Plan  Continue current medications  Depression / Anxiety   PHQ9 Score:  PHQ9 SCORE ONLY 12/27/2019 12/27/2019 05/12/2019  PHQ-9 Total Score 0 0 8   GAD7 Score: No flowsheet data found.  Patient has failed these meds in past: n/a Patient is currently controlled on the following medications:  . Paroxetine 40 mg daily  . Zolpidem 10 mg 1 tab QHS  We discussed:  Mood stable, but sleep is still poor.   Plan  Continue current medications  History of Partial Seizures   Neurologist is Dr. Krista Blue  Patient has failed these meds in past: n/a Patient is currently controlled on the following medications:  [name] Keppra 750 mg 2 tabs twice daily  Plan  Continue current medications  BPH   PSA  Date Value Ref Range Status  11/23/2015 0.077  Final      Patient has failed these meds in past: n/a Patient is currently uncontrolled on the following medications:   We discussed:  Patient concerned polyuria may be from an enlarged prostate, not just his diabetes control. Discussed fluid restrictions in the evening.  Plan  Continue control with diet and exercise Recommend evaluation for BPH and redrawing PSA.   Misc / OTC    . Acyclovir 400 mg 1/2 tab 5x daily PRN  . Benzocaine 10% gel  . Vitamin D daily 1000 units 2 tablets daily  . Docosanol 10% cream  . Flaxseed 1000 mg twice daily  . Gabapentin 100 mg BID  . Osteo Bi-Flex  . Cortisporin Soln  . Super Beta Prostate twice daily   . ProAir HFA 108 mcg/act 2 puff   Plan  Continue current medications  Vaccines   Reviewed and discussed patient's vaccination history.    Immunization History  Administered Date(s) Administered  . Fluad Quad(high Dose 65+) 09/28/2019, 10/11/2020  . Influenza, High Dose Seasonal PF 08/25/2015, 08/01/2017, 08/19/2018  . Influenza-Unspecified 09/23/2014  . PFIZER SARS-COV-2 Vaccination 02/08/2020, 02/29/2020, 10/03/2020  . Pneumococcal Conjugate-13 11/01/2014  . Pneumococcal Polysaccharide-23 11/15/2008, 04/27/2012, 10/08/2013  . Pneumococcal-Unspecified 07/09/2017  . Tdap 04/17/2010, 07/23/2013, 09/15/2017  . Zoster 09/23/2014    Plan  Recommended patient receive Shingrix vaccine.  Medication Management   Pt uses [facility] and [place] for all medications Uses pill box? Yes Pt endorses 100% compliance  We discussed: Current pharmacy is preferred with insurance plan and patient is satisfied with pharmacy services  Plan  Continue current medication management strategy  Follow up: 3 month phone visit  [place] [phone]

## 2020-10-20 ENCOUNTER — Ambulatory Visit: Payer: Medicare HMO

## 2020-10-20 DIAGNOSIS — E785 Hyperlipidemia, unspecified: Secondary | ICD-10-CM

## 2020-10-20 DIAGNOSIS — I10 Essential (primary) hypertension: Secondary | ICD-10-CM

## 2020-12-27 NOTE — Progress Notes (Signed)
Subjective:   Sean Hayden is a 75 y.o. male who presents for Medicare Annual/Subsequent preventive examination.  I connected with Brandonn Capelli today by telephone and verified that I am speaking with the correct person using two identifiers. Location patient: home Location provider: work Persons participating in the virtual visit: patient, provider.   I discussed the limitations, risks, security and privacy concerns of performing an evaluation and management service by telephone and the availability of in person appointments. I also discussed with the patient that there may be a patient responsible charge related to this service. The patient expressed understanding and verbally consented to this telephonic visit.    Interactive audio and video telecommunications were attempted between this provider and patient, however failed, due to patient having technical difficulties OR patient did not have access to video capability.  We continued and completed visit with audio only.   Review of Systems    N/A  Cardiac Risk Factors include: advanced age (>33men, >46 women);diabetes mellitus;dyslipidemia;male gender;hypertension     Objective:    There were no vitals filed for this visit. There is no height or weight on file to calculate BMI.  Advanced Directives 12/28/2020 12/27/2019 12/23/2018 12/04/2017 09/29/2017 09/23/2017 09/15/2017  Does Patient Have a Medical Advance Directive? Yes Yes Yes Yes No Yes Yes  Type of Paramedic of [place];Living will [place];Living will [place];Living will [place];Living will - [place] -  Does patient want to make changes to medical advance directive? - - - - - - Yes (ED - Information included in AVS)  Copy of [place] in Chart? No - copy requested No - copy requested No - copy requested No - copy requested - - -    Current  Medications (verified) Outpatient Encounter Medications as of 12/28/2020  Medication Sig  . albuterol (VENTOLIN HFA) 108 (90 Base) MCG/ACT inhaler Inhale 2 puffs into the lungs every 6 (six) hours as needed for wheezing or shortness of breath.  [name] atorvastatin (LIPITOR) 20 MG tablet Take 1 tablet (20 mg total) by mouth every evening. (Patient taking differently: Take 40 mg by mouth every evening.)  . Cholecalciferol (VITAMIN D PO) Take 2,000 Units by mouth daily.   . Docosanol 10 % CREA Apply topically up to 5 times per day as needed until heals  . empagliflozin (JARDIANCE) 25 MG TABS tablet Take 25 mg by mouth daily.  . Flaxseed, Linseed, (FLAXSEED OIL) 1000 MG CAPS Take 1,000 mg by mouth 2 (two) times daily.  . fluticasone (FLONASE) 50 MCG/ACT nasal spray Place 2 sprays into both nostrils daily. (Patient taking differently: Place 2 sprays into both nostrils 2 (two) times daily.)  . gabapentin (NEURONTIN) 100 MG capsule Take 1 capsule (100 mg total) by mouth 2 (two) times daily.  . insulin detemir (LEVEMIR) 100 UNIT/ML injection Inject 0.3 mLs (30 Units total) into the skin 2 (two) times daily. (Patient taking differently: Inject 60 Units into the skin 2 (two) times daily.)  . levETIRAcetam (KEPPRA) 750 MG tablet Take 2 tablets (1,500 mg total) by mouth 2 (two) times daily. 3 in the am and 3 at bedtime (Patient taking differently: Take 1,500 mg by mouth 2 (two) times daily.)  . lisinopril (ZESTRIL) 10 MG tablet Take 5 mg by mouth daily.   [name] loratadine (CLARITIN) 10 MG tablet Take 10 mg by mouth daily.   . metFORMIN (GLUCOPHAGE) 850 MG tablet Take 1 tablet (  850 mg total) by mouth 2 (two) times daily with a meal.  . Misc Natural Products (OSTEO BI-FLEX TRIPLE STRENGTH PO) Take 1 tablet by mouth 2 (two) times daily.   [name] MISC NATURAL PRODUCTS PO Take 1 capsule by mouth in the morning and at bedtime. Super Beta Prostate  . neomycin-polymyxin-hydrocortisone (CORTISPORIN) otic solution Place 4 drops into  the left ear 4 (four) times daily. (Patient taking differently: Place 4 drops into the left ear 4 (four) times daily. As needed)  . Omega-3 Fatty Acids (FISH OIL) 1000 MG CPDR Take 1,200 mg by mouth 2 (two) times daily.   [name] PARoxetine (PAXIL) 40 MG tablet Take 1 tablet (40 mg total) by mouth daily.  . rivaroxaban (XARELTO) 20 MG TABS tablet Take 1 tablet (20 mg total) by mouth daily with supper.  . zolpidem (AMBIEN) 10 MG tablet TAKE 1/2 TO 1 TABLET AT BEDTIME  . acyclovir (ZOVIRAX) 400 MG tablet 1/2 tablet up to 5 times a day for cold sores (Patient not taking: No sig reported)  . benzocaine (ORAJEL) 10 % mucosal gel Use as directed 1 application in the mouth or throat as needed for mouth pain. (Patient not taking: No sig reported)  . montelukast (SINGULAIR) 10 MG tablet TAKE ONE TABLET EVERY DAY FOR CHRONIC COUGH (Patient not taking: No sig reported)   No facility-administered encounter medications on file as of 12/28/2020.    Allergies (verified) Actos [pioglitazone]   History: Past Medical History:  Diagnosis Date  . Aortic stenosis    moderate by 02/2016 echo  . B12 deficiency   . Cancer ([place])    skin cancer - face   . CVA (cerebral vascular accident) ([place])    02/2016 no residual  . Diabetes mellitus without complication ([place])   . Gout   . Hemorrhoids   . History of kidney stones   . Hyperlipidemia   . Hyperplastic colon polyp   . Hypertension   . MRSA infection   . Neuropathy   . PAF (paroxysmal atrial fibrillation) ([place])   . Personal history of urinary calculi 05/25/2015  . Pneumonia    1990's  . Seizures ([place])    Past Surgical History:  Procedure Laterality Date  . Arm fracture Left   . CAROTID ARTERY ANGIOPLASTY Right 04/17/2012   Dr. Delana Meyer, [facility]  . CAROTID ENDARTERECTOMY Right 04/17/2012   Dr. Delana Meyer, [facility]  . CHOLECYSTECTOMY  2005   lap  . COLONOSCOPY    . CYST EXCISION     "between legs"  . EP IMPLANTABLE DEVICE N/A 02/23/2016   Procedure: Loop Recorder  Insertion;  Surgeon: Thompson Grayer, MD;  Location: [place] [facility];  Service: Cardiovascular;  Laterality: N/A;  . HEMORRHOID SURGERY  2009  . INCISION AND DRAINAGE WOUND WITH NERVE AND ARTERY REPAIR Left 10/04/2017   Procedure: LEFT HAND WOUND EXPLORATION repair as necessary;  Surgeon: Iran Planas, MD;  Location: [place] [facility];  Service: Orthopedics;  Laterality: Left;  . TEE WITHOUT CARDIOVERSION N/A 02/23/2016   Procedure: TRANSESOPHAGEAL ECHOCARDIOGRAM (TEE);  Surgeon: Pixie Casino, MD;  Location: [facility];  Service: Cardiovascular;  Laterality: N/A;  ALSO GETTING A LOOP   Family History  Problem Relation Age of Onset  . Hodgkin's lymphoma Father   . Diabetes Mother    Social History   Socioeconomic History  . Marital status: Married    Spouse name: Not on file  . Number of children: 2  . Years of education: HS Grad  . Highest education  level: 12th grade  Occupational History  . Occupation: Retired  Tobacco Use  . Smoking status: Former Smoker    Years: 45.00    Types: Cigars    Quit date: 04/08/2012    Years since quitting: 8.7  . Smokeless tobacco: Never Used  . Tobacco comment: cigarettes and cigars  Vaping Use  . Vaping Use: Never used  Substance and Sexual Activity  . Alcohol use: No    Alcohol/week: 0.0 standard drinks    Comment: quit 2013  . Drug use: No  . Sexual activity: Not on file  Other Topics Concern  . Not on file  Social History Narrative   Lives at home with his granddaughter.   Right-handed.   No caffeine use.       Social Determinants of Health   Financial Resource Strain: Low Risk   . Difficulty of Paying Living Expenses: Not hard at all  Food Insecurity: No Food Insecurity  . Worried About Charity fundraiser in the Last Year: Never true  . Ran Out of Food in the Last Year: Never true  Transportation Needs: No Transportation Needs  . Lack of Transportation (Medical): No  . Lack of Transportation (Non-Medical): No  Physical Activity:  Inactive  . Days of Exercise per Week: 0 days  . Minutes of Exercise per Session: 0 min  Stress: No Stress Concern Present  . Feeling of Stress : Not at all  Social Connections: Moderately Isolated  . Frequency of Communication with Friends and Family: More than three times a week  . Frequency of Social Gatherings with Friends and Family: More than three times a week  . Attends Religious Services: Never  . Active Member of Clubs or Organizations: No  . Attends Archivist Meetings: Never  . Marital Status: Married    Tobacco Counseling Counseling given: Not Answered Comment: cigarettes and cigars   Clinical Intake:  Pre-visit preparation completed: Yes  Pain : No/denies pain     Nutritional Risks: None Diabetes: Yes  How often do you need to have someone help you when you read instructions, pamphlets, or other written materials from your doctor or pharmacy?: 1 - Never  Diabetic? Yes  Nutrition Risk Assessment:  Has the patient had any N/V/D within the last 2 months?  No  Does the patient have any non-healing wounds?  No  Has the patient had any unintentional weight loss or weight gain?  No   Diabetes:  Is the patient diabetic?  Yes  If diabetic, was a CBG obtained today?  No  Did the patient bring in their glucometer from home?  No  How often do you monitor your CBG's? Once every 2-3 days.   Financial Strains and Diabetes Management:  Are you having any financial strains with the device, your supplies or your medication? No .  Does the patient want to be seen by Chronic Care Management for management of their diabetes?  No  Would the patient like to be referred to a Nutritionist or for Diabetic Management?  No   Diabetic Exams:  Diabetic Eye Exam: Overdue for diabetic eye exam. Pt has been advised about the importance in completing this exam. Next eye exam scheduled for 02/2021. Diabetic Foot Exam: Overdue, Pt has been advised about the importance in  completing this exam.    Interpreter Needed?: No  Information entered by :: [facility], LPN   Activities of Daily Living In your present state of health, do you have any difficulty  performing the following activities: 12/28/2020  Hearing? N  Vision? N  Difficulty concentrating or making decisions? N  Walking or climbing stairs? Y  Comment Avoids steps.  Dressing or bathing? N  Doing errands, shopping? N  Preparing Food and eating ? N  Using the Toilet? N  In the past six months, have you accidently leaked urine? Y  Comment Curently wears depends.  Do you have problems with loss of bowel control? Y  Comment Currently wears depends.  Managing your Medications? N  Managing your Finances? N  Housekeeping or managing your Housekeeping? N  Some recent data might be hidden    Patient Care Team: Birdie Sons, MD as PCP - General (Family Medicine) Marcial Pacas, MD as Consulting Physician (Neurology) [place], [facility], [place] as Referring Physician ([place]) Germaine Pomfret, [facility] as Pharmacist (Pharmacist) Eulogio Bear, MD as Consulting Physician (Ophthalmology) Thompson Grayer, MD as Consulting Physician (Cardiology)  Indicate any recent Medical Services you may have received from other than [facility] providers in the past year (date may be approximate).     Assessment:   This is a routine wellness examination for [place].  Hearing/Vision screen No exam data present  Dietary issues and exercise activities discussed: Current Exercise Habits: The patient does not participate in regular exercise at present, Exercise limited by: orthopedic condition(s)  Goals    . Chronic Care Management     CARE PLAN ENTRY (see longitudinal plan of care for additional care plan information)  Current Barriers:  . Chronic Disease Management support, education, and care coordination needs related to Hypertension, Hyperlipidemia, Diabetes, Atrial  Fibrillation and Allergic Rhinitis    Hypertension BP Readings from Last 3 Encounters:  05/16/20 121/69  04/06/20 (!) 156/79  05/12/19 128/70   . Pharmacist Clinical Goal(s): o Over the next 90 days, patient will work with PharmD and providers to maintain BP goal <130/80 . Current regimen:  o Lisinopril 10 mg 1/2 tablet daily  . Interventions: o Discussed low salt diet and exercising as tolerated extensively o Will initiate blood pressure monitoring plan  . Patient self care activities - Over the next 90 days, patient will: o Check Blood Pressure Weekly, document, and provide at future appointments o Ensure daily salt intake < 2300 mg/day  Hyperlipidemia Lab Results  Component Value Date/Time   LDLCALC 49 02/07/2020 12:00 AM   . Pharmacist Clinical Goal(s): o Over the next 90 days, patient will work with PharmD and providers to achieve LDL goal < 70 . Current regimen:  o Atorvastatin 40 mg daily  . Interventions: o Discussed low cholesterol diet and exercising as tolerated extensively o Will initiate cholesterol monitoring plan   Diabetes Lab Results  Component Value Date/Time   HGBA1C 7.6 (H) 05/16/2020 10:33 AM   HGBA1C 7.7 02/07/2020 12:00 AM   HGBA1C 7.1 (A) 06/08/2018 08:48 AM   HGBA1C 9.7 02/02/2018 08:46 AM   HGBA1C 7.2 11/06/2017 12:00 AM   . Pharmacist Clinical Goal(s): o Over the next 90 days, patient will work with PharmD and providers to maintain A1c goal <8% . Current regimen:  . Jardiance 25 mg daily . Levemir 60 units twice daily  . Metformin 850 mg twice daily . Interventions: o Discussed carbohydrate counting and exercising as tolerated extensively o Will initiate blood sugar monitoring plan  . Patient self care activities - Over the next 90 days, patient will: o Check blood sugar once daily, document, and provide at future appointments o  Contact provider with any episodes of hypoglycemia  Medication management . Pharmacist Clinical  Goal(s): o Over the next 90 days, patient will work with PharmD and providers to maintain optimal medication adherence . Current pharmacy: [facility] and [place]  . Interventions o Comprehensive medication review performed. o Continue current medication management strategy . Patient self care activities - Over the next 90 days, patient will: o Take medications as prescribed o Report any questions or concerns to PharmD and/or provider(s)    . DIET - EAT MORE FRUITS AND VEGETABLES     Recommend to eat 2 servings of fruits and vegetables each a day.     [name] DIET - REDUCE SUGAR INTAKE     Recommend to cut out sugar and any desserts in diet to help aid in weight loss.     . Prevent falls     Recommend to remove any items from the home that may cause slips or trips.      Depression Screen PHQ 2/9 Scores 12/28/2020 12/27/2019 12/27/2019 05/12/2019 12/23/2018 12/04/2017 12/06/2016  PHQ - 2 Score 0 0 0 2 2 5 1   PHQ- 9 Score - - - 8 6 15  -    Fall Risk Fall Risk  12/28/2020 12/27/2019 05/12/2019 12/23/2018 12/04/2017  Falls in the past year? 1 1 0 0 Yes  Number falls in past yr: 1 0 - - 1  Injury with Fall? 0 0 - - Yes  Comment - - - - open wound on left hand and currently in rehab  Follow up Falls prevention discussed Falls prevention discussed - - Falls prevention discussed    FALL RISK PREVENTION PERTAINING TO THE HOME:  Any stairs in or around the home? Yes  If so, are there any without handrails? No  Home free of loose throw rugs in walkways, pet beds, electrical cords, etc? Yes  Adequate lighting in your home to reduce risk of falls? Yes   ASSISTIVE DEVICES UTILIZED TO PREVENT FALLS:  Life alert? No  Use of a cane, walker or w/c? No  Grab bars in the bathroom? Yes  Shower chair or bench in shower? No  Elevated toilet seat or a handicapped toilet? No    Cognitive Function:     6CIT Screen 12/06/2016  What Year? 4 points  What month? 0 points  What time? 0 points  Count  back from 20 0 points  Months in reverse 0 points  Repeat phrase 2 points  Total Score 6    Immunizations Immunization History  Administered Date(s) Administered  . Fluad Quad(high Dose 65+) 09/28/2019, 10/11/2020  . Influenza, High Dose Seasonal PF 08/25/2015, 08/01/2017, 08/19/2018  . Influenza-Unspecified 09/23/2014  . PFIZER(Purple Top)SARS-COV-2 Vaccination 02/08/2020, 02/29/2020, 10/03/2020  . Pneumococcal Conjugate-13 11/01/2014  . Pneumococcal Polysaccharide-23 11/15/2008, 04/27/2012, 10/08/2013  . Pneumococcal-Unspecified 07/09/2017  . Tdap 04/17/2010, 07/23/2013, 09/15/2017  . Zoster 09/23/2014    TDAP status: Up to date  Flu Vaccine status: Up to date  Pneumococcal vaccine status: Up to date  Covid-19 vaccine status: Completed vaccines  Qualifies for Shingles Vaccine? Yes   Zostavax completed Yes   Shingrix Completed?: No.    Education has been provided regarding the importance of this vaccine. Patient has been advised to call insurance company to determine out of pocket expense if they have not yet received this vaccine. Advised may also receive vaccine at local pharmacy or Health Dept. Verbalized acceptance and understanding.  Screening Tests Health Maintenance  Topic Date Due  . FOOT  EXAM  09/08/2017  . OPHTHALMOLOGY EXAM  09/18/2019  . HEMOGLOBIN A1C  11/15/2020  . Fecal DNA (Cologuard)  02/20/2021  . TETANUS/TDAP  09/16/2027  . INFLUENZA VACCINE  Completed  . COVID-19 Vaccine  Completed  . Hepatitis C Screening  Completed  . PNA vac Low Risk Adult  Completed    Health Maintenance  Health Maintenance Due  Topic Date Due  . FOOT EXAM  09/08/2017  . OPHTHALMOLOGY EXAM  09/18/2019  . HEMOGLOBIN A1C  11/15/2020    Colorectal cancer screening: Type of screening: Cologuard. Completed 02/20/18. Repeat every 3 years  Lung Cancer Screening: (Low Dose CT Chest recommended if Age 82-80 years, 30 pack-year currently smoking OR have quit w/in 15years.) does  qualify however declines order at this time.   Additional Screening:  Hepatitis C Screening: Up to date  Vision Screening: Recommended annual ophthalmology exams for early detection of glaucoma and other disorders of the eye. Is the patient up to date with their annual eye exam?  Yes  Who is the provider or what is the name of the office in which the patient attends annual eye exams? Dr Edison Pace @ [place] If pt is not established with a provider, would they like to be referred to a provider to establish care? No .   Dental Screening: Recommended annual dental exams for proper oral hygiene  Community Resource Referral / Chronic Care Management: CRR required this visit?  No   CCM required this visit?  No      Plan:     I have personally reviewed and noted the following in the patient's chart:   . Medical and social history . Use of alcohol, tobacco or illicit drugs  . Current medications and supplements . Functional ability and status . Nutritional status . Physical activity . Advanced directives . List of other physicians . Hospitalizations, surgeries, and ER visits in previous 12 months . Vitals . Screenings to include cognitive, depression, and falls . Referrals and appointments  In addition, I have reviewed and discussed with patient certain preventive protocols, quality metrics, and best practice recommendations. A written personalized care plan for preventive services as well as general preventive health recommendations were provided to patient.     Tyrel Lex [place], [place]   [id number]   Nurse Notes: Pt needs a diabetic foot exam and Hgb A1c check at next in office apt. Pt has an eye exam scheduled for 02/2021.

## 2020-12-28 ENCOUNTER — Other Ambulatory Visit: Payer: Self-pay

## 2020-12-28 ENCOUNTER — Ambulatory Visit (INDEPENDENT_AMBULATORY_CARE_PROVIDER_SITE_OTHER): Payer: Medicare HMO

## 2020-12-28 DIAGNOSIS — Z0000 Encounter for general adult medical examination without abnormal findings: Secondary | ICD-10-CM | POA: Diagnosis not present

## 2020-12-28 NOTE — Patient Instructions (Signed)
Sean Hayden , Thank you for taking time to come for your Medicare Wellness Visit. I appreciate your ongoing commitment to your health goals. Please review the following plan we discussed and let me know if I can assist you in the future.   Screening recommendations/referrals: Colonoscopy: Cologuard up to date, due 3/202022 Recommended yearly ophthalmology/optometry visit for glaucoma screening and checkup Recommended yearly dental visit for hygiene and checkup  Vaccinations: Influenza vaccine: Done 10/11/20 Pneumococcal vaccine: Completed series Tdap vaccine: Up to date, due 09/2027 Shingles vaccine: Shingrix discussed. Please contact your pharmacy for coverage information.     Advanced directives: Please bring a copy of your POA (Power of Attorney) and/or Living Will to your next appointment.   Conditions/risks identified: Fall risk preventatives discussed today. Continue with current diet plans on increasing fruits and vegetables in diet and cutting back on all sugars.   Next appointment: 01/16/21 @ 8:40 AM with Dr Caryn Section   Preventive Care 76 Years and Older, Male Preventive care refers to lifestyle choices and visits with your health care provider that can promote health and wellness. What does preventive care include?  A yearly physical exam. This is also called an annual well check.  Dental exams once or twice a year.  Routine eye exams. Ask your health care provider how often you should have your eyes checked.  Personal lifestyle choices, including:  Daily care of your teeth and gums.  Regular physical activity.  Eating a healthy diet.  Avoiding tobacco and drug use.  Limiting alcohol use.  Practicing safe sex.  Taking low doses of aspirin every day.  Taking vitamin and mineral supplements as recommended by your health care provider. What happens during an annual well check? The services and screenings done by your health care provider during your annual well check  will depend on your age, overall health, lifestyle risk factors, and family history of disease. Counseling  Your health care provider may ask you questions about your:  Alcohol use.  Tobacco use.  Drug use.  Emotional well-being.  Home and relationship well-being.  Sexual activity.  Eating habits.  History of falls.  Memory and ability to understand (cognition).  Work and work Statistician. Screening  You may have the following tests or measurements:  Height, weight, and BMI.  Blood pressure.  Lipid and cholesterol levels. These may be checked every 5 years, or more frequently if you are over 4 years old.  Skin check.  Lung cancer screening. You may have this screening every year starting at age 43 if you have a 30-pack-year history of smoking and currently smoke or have quit within the past 15 years.  Fecal occult blood test (FOBT) of the stool. You may have this test every year starting at age 26.  Flexible sigmoidoscopy or colonoscopy. You may have a sigmoidoscopy every 5 years or a colonoscopy every 10 years starting at age 62.  Prostate cancer screening. Recommendations will vary depending on your family history and other risks.  Hepatitis C blood test.  Hepatitis B blood test.  Sexually transmitted disease (STD) testing.  Diabetes screening. This is done by checking your blood sugar (glucose) after you have not eaten for a while (fasting). You may have this done every 1-3 years.  Abdominal aortic aneurysm (AAA) screening. You may need this if you are a current or former smoker.  Osteoporosis. You may be screened starting at age 15 if you are at high risk. Talk with your health care provider about your test  results, treatment options, and if necessary, the need for more tests. Vaccines  Your health care provider may recommend certain vaccines, such as:  Influenza vaccine. This is recommended every year.  Tetanus, diphtheria, and acellular pertussis  (Tdap, Td) vaccine. You may need a Td booster every 10 years.  Zoster vaccine. You may need this after age 58.  Pneumococcal 13-valent conjugate (PCV13) vaccine. One dose is recommended after age 57.  Pneumococcal polysaccharide (PPSV23) vaccine. One dose is recommended after age 45. Talk to your health care provider about which screenings and vaccines you need and how often you need them. This information is not intended to replace advice given to you by your health care provider. Make sure you discuss any questions you have with your health care provider. Document Released: 12/22/2015 Document Revised: 08/14/2016 Document Reviewed: 09/26/2015 Elsevier Interactive Patient Education  2017 [place] Prevention in the Home Falls can cause injuries. They can happen to people of all ages. There are many things you can do to make your home safe and to help prevent falls. What can I do on the outside of my home?  Regularly fix the edges of walkways and driveways and fix any cracks.  Remove anything that might make you trip as you walk through a door, such as a raised step or threshold.  Trim any bushes or trees on the path to your home.  Use bright outdoor lighting.  Clear any walking paths of anything that might make someone trip, such as rocks or tools.  Regularly check to see if handrails are loose or broken. Make sure that both sides of any steps have handrails.  Any raised decks and porches should have guardrails on the edges.  Have any leaves, snow, or ice cleared regularly.  Use sand or salt on walking paths during winter.  Clean up any spills in your garage right away. This includes oil or grease spills. What can I do in the bathroom?  Use night lights.  Install grab bars by the toilet and in the tub and shower. Do not use towel bars as grab bars.  Use non-skid mats or decals in the tub or shower.  If you need to sit down in the shower, use a plastic, non-slip  stool.  Keep the floor dry. Clean up any water that spills on the floor as soon as it happens.  Remove soap buildup in the tub or shower regularly.  Attach bath mats securely with double-sided non-slip rug tape.  Do not have throw rugs and other things on the floor that can make you trip. What can I do in the bedroom?  Use night lights.  Make sure that you have a light by your bed that is easy to reach.  Do not use any sheets or blankets that are too big for your bed. They should not hang down onto the floor.  Have a firm chair that has side arms. You can use this for support while you get dressed.  Do not have throw rugs and other things on the floor that can make you trip. What can I do in the kitchen?  Clean up any spills right away.  Avoid walking on wet floors.  Keep items that you use a lot in easy-to-reach places.  If you need to reach something above you, use a strong step stool that has a grab bar.  Keep electrical cords out of the way.  Do not use floor polish or wax that makes  floors slippery. If you must use wax, use non-skid floor wax.  Do not have throw rugs and other things on the floor that can make you trip. What can I do with my stairs?  Do not leave any items on the stairs.  Make sure that there are handrails on both sides of the stairs and use them. Fix handrails that are broken or loose. Make sure that handrails are as long as the stairways.  Check any carpeting to make sure that it is firmly attached to the stairs. Fix any carpet that is loose or worn.  Avoid having throw rugs at the top or bottom of the stairs. If you do have throw rugs, attach them to the floor with carpet tape.  Make sure that you have a light switch at the top of the stairs and the bottom of the stairs. If you do not have them, ask someone to add them for you. What else can I do to help prevent falls?  Wear shoes that:  Do not have high heels.  Have rubber bottoms.  Are  comfortable and fit you well.  Are closed at the toe. Do not wear sandals.  If you use a stepladder:  Make sure that it is fully opened. Do not climb a closed stepladder.  Make sure that both sides of the stepladder are locked into place.  Ask someone to hold it for you, if possible.  Clearly mark and make sure that you can see:  Any grab bars or handrails.  First and last steps.  Where the edge of each step is.  Use tools that help you move around (mobility aids) if they are needed. These include:  Canes.  Walkers.  Scooters.  Crutches.  Turn on the lights when you go into a dark area. Replace any light bulbs as soon as they burn out.  Set up your furniture so you have a clear path. Avoid moving your furniture around.  If any of your floors are uneven, fix them.  If there are any pets around you, be aware of where they are.  Review your medicines with your doctor. Some medicines can make you feel dizzy. This can increase your chance of falling. Ask your doctor what other things that you can do to help prevent falls. This information is not intended to replace advice given to you by your health care provider. Make sure you discuss any questions you have with your health care provider. Document Released: 09/21/2009 Document Revised: 05/02/2016 Document Reviewed: 12/30/2014 Elsevier Interactive Patient Education  2017 [employer].

## 2021-01-01 ENCOUNTER — Telehealth: Payer: Self-pay

## 2021-01-01 NOTE — Progress Notes (Signed)
Chronic Care Management Pharmacy Assistant   Name: Sean Hayden  MRN: [id number] DOB: 08/25/1946  Reason for [place] Call.  Patient Questions:  1.  Have you seen any other providers since your last visit? No  2.  Any changes in your medicines or health? No     PCP : Birdie Sons, MD  Allergies:   Allergies  Allergen Reactions  . Actos [Pioglitazone]     UNSPECIFIED REACTION     Medications: Outpatient Encounter Medications as of 01/01/2021  Medication Sig  . acyclovir (ZOVIRAX) 400 MG tablet 1/2 tablet up to 5 times a day for cold sores (Patient not taking: No sig reported)  . albuterol (VENTOLIN HFA) 108 (90 Base) MCG/ACT inhaler Inhale 2 puffs into the lungs every 6 (six) hours as needed for wheezing or shortness of breath.  [name] atorvastatin (LIPITOR) 20 MG tablet Take 1 tablet (20 mg total) by mouth every evening. (Patient taking differently: Take 40 mg by mouth every evening.)  . benzocaine (ORAJEL) 10 % mucosal gel Use as directed 1 application in the mouth or throat as needed for mouth pain. (Patient not taking: No sig reported)  . Cholecalciferol (VITAMIN D PO) Take 2,000 Units by mouth daily.   . Docosanol 10 % CREA Apply topically up to 5 times per day as needed until heals  . empagliflozin (JARDIANCE) 25 MG TABS tablet Take 25 mg by mouth daily.  . Flaxseed, Linseed, (FLAXSEED OIL) 1000 MG CAPS Take 1,000 mg by mouth 2 (two) times daily.  . fluticasone (FLONASE) 50 MCG/ACT nasal spray Place 2 sprays into both nostrils daily. (Patient taking differently: Place 2 sprays into both nostrils 2 (two) times daily.)  . gabapentin (NEURONTIN) 100 MG capsule Take 1 capsule (100 mg total) by mouth 2 (two) times daily.  . insulin detemir (LEVEMIR) 100 UNIT/ML injection Inject 0.3 mLs (30 Units total) into the skin 2 (two) times daily. (Patient taking differently: Inject 60 Units into the skin 2 (two) times daily.)  . levETIRAcetam (KEPPRA) 750  MG tablet Take 2 tablets (1,500 mg total) by mouth 2 (two) times daily. 3 in the am and 3 at bedtime (Patient taking differently: Take 1,500 mg by mouth 2 (two) times daily.)  . lisinopril (ZESTRIL) 10 MG tablet Take 5 mg by mouth daily.   [name] loratadine (CLARITIN) 10 MG tablet Take 10 mg by mouth daily.   . metFORMIN (GLUCOPHAGE) 850 MG tablet Take 1 tablet (850 mg total) by mouth 2 (two) times daily with a meal.  . Misc Natural Products (OSTEO BI-FLEX TRIPLE STRENGTH PO) Take 1 tablet by mouth 2 (two) times daily.   [name] MISC NATURAL PRODUCTS PO Take 1 capsule by mouth in the morning and at bedtime. Super Beta Prostate  . montelukast (SINGULAIR) 10 MG tablet TAKE ONE TABLET EVERY DAY FOR CHRONIC COUGH (Patient not taking: No sig reported)  . neomycin-polymyxin-hydrocortisone (CORTISPORIN) otic solution Place 4 drops into the left ear 4 (four) times daily. (Patient taking differently: Place 4 drops into the left ear 4 (four) times daily. As needed)  . Omega-3 Fatty Acids (FISH OIL) 1000 MG CPDR Take 1,200 mg by mouth 2 (two) times daily.   [name] PARoxetine (PAXIL) 40 MG tablet Take 1 tablet (40 mg total) by mouth daily.  . rivaroxaban (XARELTO) 20 MG TABS tablet Take 1 tablet (20 mg total) by mouth daily with supper.  . zolpidem (AMBIEN) 10 MG tablet TAKE 1/2 TO 1 TABLET AT  BEDTIME   No facility-administered encounter medications on file as of 01/01/2021.    Current Diagnosis: Patient Active Problem List   Diagnosis Date Noted  . Diverticulosis   . Paresthesia 04/13/2019  . Cerebrovascular disease 12/29/2017  . Partial seizure ([place]) 02/10/2017  . History of MRSA infection 01/31/2017  . Situational stress 04/16/2016  . Paroxysmal atrial fibrillation ([place]) 03/26/2016  . Vitamin D deficiency 02/28/2016  . Aortic valve stenosis, congenital   . History of CVA (cerebrovascular accident) without residual deficits 02/22/2016  . Diabetes mellitus with neuropathy ([place]) 02/21/2016  . Hyperlipidemia  02/21/2016  . Hypertension 02/21/2016  . History of right-sided carotid endarterectomy 02/21/2016  . Allergic rhinitis 02/21/2016  . B12 deficiency 12/14/2015  . Arthritis 05/25/2015  . Diverticulosis of colon 05/25/2015  . Edema 05/25/2015  . Obstructive sleep apnea of adult 05/25/2015  . Psoriasis 05/25/2015  . RAD (reactive airway disease) 05/25/2015  . TIA (transient ischemic attack) 05/25/2015  . Carotid artery disease ([place]) 03/10/2012  . Morbid obesity ([place]) 01/18/2008  . Insomnia 12/09/2006  . Gouty arthritis 02/07/1999    Goals Addressed   None    Reviewed chart prior to disease state call. Spoke with patient regarding BP  Recent Office Vitals: BP Readings from Last 3 Encounters:  05/16/20 121/69  04/06/20 (!) 156/79  05/12/19 128/70   Pulse Readings from Last 3 Encounters:  05/16/20 76  04/06/20 97  05/12/19 79    Wt Readings from Last 3 Encounters:  05/16/20 252 lb (114.3 kg)  04/06/20 251 lb 3.2 oz (113.9 kg)  05/12/19 242 lb (109.8 kg)     Kidney Function Lab Results  Component Value Date/Time   CREATININE 0.8 02/07/2020 12:00 AM   CREATININE 0.70 09/29/2017 10:04 PM   CREATININE 0.81 09/23/2017 07:53 PM   CREATININE 1.07 04/18/2012 04:12 AM   CREATININE 1.20 04/02/2012 01:47 PM   GFRNONAA 100.4 02/07/2020 12:00 AM   GFRNONAA >60 04/18/2012 04:12 AM   GFRAA >60 09/23/2017 07:53 PM   GFRAA 118.2 03/03/2017 10:19 AM    BMP Latest Ref Rng & Units 02/07/2020 09/29/2017 09/23/2017  Glucose 65 - 99 mg/dL - 282(H) 243(H)  BUN 4 - 21 13 11 11   Creatinine 0.6 - 1.3 0.8 0.70 0.81  Sodium 137 - 147 139 136 136  Potassium 3.4 - 5.3 4.3 4.7 4.8  Chloride 101 - 111 mmol/L - 99(L) 103  CO2 22 - 32 mmol/L - - 24  Calcium 8.7 - 10.7 9.3 - 9.1    . Current antihypertensive regimen:  ? Lisinopril 10 mg 1/2 tablet daily   . What recent interventions/DTPs have been made by any provider to improve Blood Pressure control since last CPP Visit: None ID . Any  recent hospitalizations or ED visits since last visit with CPP? No   Adherence Review: Is the patient currently on ACE/ARB medication? Yes Does the patient have >5 day gap between last estimated fill dates? Yes  I have attempted without success to contact this patient by phone three times to do his hypertension  Disease State call. I left a Voice message for patient to return my call.  Left voice message on  01/24 ,01/26,01/31  Follow-Up:  Pharmacist Review   [place] Pharmacist Assistant [phone]

## 2021-01-15 ENCOUNTER — Telehealth: Payer: Self-pay

## 2021-01-16 ENCOUNTER — Ambulatory Visit (INDEPENDENT_AMBULATORY_CARE_PROVIDER_SITE_OTHER): Payer: Medicare HMO

## 2021-01-16 DIAGNOSIS — I10 Essential (primary) hypertension: Secondary | ICD-10-CM | POA: Diagnosis not present

## 2021-01-16 DIAGNOSIS — E785 Hyperlipidemia, unspecified: Secondary | ICD-10-CM

## 2021-01-16 NOTE — Patient Instructions (Signed)
Visit Information It was great speaking with you today!  Please let me know if you have any questions about our visit. Goals Addressed            This Visit's Progress   . Monitor and Manage My Blood Sugar-Diabetes Type 2       Timeframe:  Long-Range Goal Priority:  High Start Date: 01/16/21                             Expected End Date: 07/16/21                      Follow Up Date 04/07/2021    - check blood sugar at prescribed times - check blood sugar if I feel it is too high or too low - take the blood sugar meter to all doctor visits    Why is this important?    Checking your blood sugar at home helps to keep it from getting very high or very low.   Writing the results in a diary or log helps the doctor know how to care for you.   Your blood sugar log should have the time, date and the results.   Also, write down the amount of insulin or other medicine that you take.   Other information, like what you ate, exercise done and how you were feeling, will also be helpful.     Notes:        The patient verbalized understanding of instructions, educational materials, and care plan provided today and declined offer to receive copy of patient instructions, educational materials, and care plan.   Telephone follow up appointment with pharmacy team member scheduled for: 07/16/21 at 2:00 PM  [place] [phone]

## 2021-01-16 NOTE — Progress Notes (Signed)
Chronic Care Management Pharmacy Note  01/16/2021 Name:  Sean Hayden MRN:  [id number] DOB:  09/02/1946  Subjective: Sean Hayden is an 75 y.o. year old male who is a primary patient of Fisher, Kirstie Peri, MD.  The CCM team was consulted for assistance with disease management and care coordination needs.    Engaged with patient by telephone for follow up visit in response to provider referral for pharmacy case management and/or care coordination services.   Consent to Services:  The patient was given the following information about Chronic Care Management services today, agreed to services, and gave verbal consent: 1. CCM service includes personalized support from designated clinical staff supervised by the primary care provider, including individualized plan of care and coordination with other care providers 2. 24/7 contact phone numbers for assistance for urgent and routine care needs. 3. Service will only be billed when office clinical staff spend 20 minutes or more in a month to coordinate care. 4. Only one practitioner may furnish and bill the service in a calendar month. 5.The patient may stop CCM services at any time (effective at the end of the month) by phone call to the office staff. 6. The patient will be responsible for cost sharing (co-pay) of up to 20% of the service fee (after annual deductible is met). Patient agreed to services and consent obtained.  Patient Care Team: Birdie Sons, MD as PCP - General (Family Medicine) Marcial Pacas, MD as Consulting Physician (Neurology) Pa, [facility], [place] as Referring Physician ([place]) Germaine Pomfret, [facility] as Pharmacist (Pharmacist) Eulogio Bear, MD as Consulting Physician (Ophthalmology) Thompson Grayer, MD as Consulting Physician (Cardiology)  Recent office visits: 12/28/20: Patient presented to [facility], LPN for AWV.  0/3/01: Patient presented to Dr. Caryn Section for  follow-up. A1c stable at 7.7%. Paroxetine increased to 40 mg daily. Vitamin D 1000 units daily started.  Recent consult visits: None noted in past 6 months.   Objective:  Lab Results  Component Value Date   CREATININE 0.8 02/07/2020   BUN 13 02/07/2020   GFRNONAA 100.4 02/07/2020   GFRAA >60 09/23/2017   NA 139 02/07/2020   K 4.3 02/07/2020   CALCIUM 9.3 02/07/2020   CO2 24 09/23/2017    Lab Results  Component Value Date/Time   HGBA1C 7.6 (H) 05/16/2020 10:33 AM   HGBA1C 7.7 02/07/2020 12:00 AM   HGBA1C 7.1 (A) 06/08/2018 08:48 AM   HGBA1C 9.7 02/02/2018 08:46 AM   HGBA1C 7.2 11/06/2017 12:00 AM   MICROALBUR 15 02/07/2020 12:00 AM   MICROALBUR 20 02/02/2018 08:46 AM    Last diabetic Eye exam:  Lab Results  Component Value Date/Time   HMDIABEYEEXA Retinopathy (A) 02/24/2017 12:00 AM    Last diabetic Foot exam: No results found for: HMDIABFOOTEX   Lab Results  Component Value Date   CHOL 123 02/07/2020   HDL 37 02/07/2020   LDLCALC 49 02/07/2020   TRIG 184 (A) 02/07/2020   CHOLHDL 3.3 02/21/2016    Hepatic Function Latest Ref Rng & Units 02/07/2020 09/23/2017 01/23/2017  Total Protein 6.5 - 8.1 g/dL - 7.5 -  Albumin 3.5 - 5.0 g/dL - 3.9 -  AST 14 - 40 '24 23 14  ' ALT 10 - 40 36 20 20  Alk Phosphatase 25 - 125 99 76 -  Total Bilirubin 0.3 - 1.2 mg/dL - 0.4 -    Lab Results  Component Value Date/Time   TSH 1.82 11/23/2015  12:00 AM   TSH 3.07 08/24/2013 12:00 AM    CBC Latest Ref Rng & Units 05/16/2020 10/04/2017 09/29/2017  WBC 3.4 - 10.8 x10E3/uL 6.2 5.8 -  Hemoglobin 13.0 - 17.7 g/dL 15.5 10.0(L) 10.9(L)  Hematocrit 37.5 - 51.0 % 46.8 30.1(L) 32.0(L)  Platelets 150 - 450 x10E3/uL 180 196 -    Lab Results  Component Value Date/Time   VD25OH 23.5 (L) 05/16/2020 10:33 AM   VD25OH 45.5 02/28/2016 09:16 AM    Clinical ASCVD: No  The ASCVD Risk score Mikey Bussing DC Jr., et al., 2013) failed to calculate for the following reasons:   The patient has a prior MI or  stroke diagnosis    Depression screen [facility] 2/9 12/28/2020 12/27/2019 12/27/2019  Decreased Interest 0 0 0  Down, Depressed, Hopeless 0 0 0  PHQ - 2 Score 0 0 0  Altered sleeping - - -  Tired, decreased energy - - -  Change in appetite - - -  Feeling bad or failure about yourself  - - -  Trouble concentrating - - -  Moving slowly or fidgety/restless - - -  Suicidal thoughts - - -  PHQ-9 Score - - -  Difficult doing work/chores - - -     Social History   Tobacco Use  Smoking Status Former Smoker  . Years: 45.00  . Types: Cigars  . Quit date: 04/08/2012  . Years since quitting: 8.7  Smokeless Tobacco Never Used  Tobacco Comment   cigarettes and cigars   BP Readings from Last 3 Encounters:  05/16/20 121/69  04/06/20 (!) 156/79  05/12/19 128/70   Pulse Readings from Last 3 Encounters:  05/16/20 76  04/06/20 97  05/12/19 79   Wt Readings from Last 3 Encounters:  05/16/20 252 lb (114.3 kg)  04/06/20 251 lb 3.2 oz (113.9 kg)  05/12/19 242 lb (109.8 kg)    Assessment/Interventions: Review of patient past medical history, allergies, medications, health status, including review of consultants reports, laboratory and other test data, was performed as part of comprehensive evaluation and provision of chronic care management services.   SDOH:  (Social Determinants of Health) assessments and interventions performed: Yes SDOH Interventions   Flowsheet Row Most Recent Value  SDOH Interventions   Financial Strain Interventions Intervention Not Indicated  Transportation Interventions Intervention Not Indicated      CCM Care Plan  Allergies  Allergen Reactions  . Actos [Pioglitazone]     UNSPECIFIED REACTION     Medications Reviewed Today    Reviewed by Fabio Neighbors, LPN (Licensed Practical Nurse) on 12/28/20 at 1034  Med List Status: <None>  Medication Order Taking? Sig Documenting Provider Last Dose Status Informant  acyclovir (ZOVIRAX) 400 MG tablet [id number] No 1/2  tablet up to 5 times a day for cold sores  Patient not taking: No sig reported   Birdie Sons, MD Not Taking Active   albuterol (VENTOLIN HFA) 108 (90 Base) MCG/ACT inhaler [id number] Yes Inhale 2 puffs into the lungs every 6 (six) hours as needed for wheezing or shortness of breath. [provider] Taking Active   atorvastatin (LIPITOR) 20 MG tablet [id number] Yes Take 1 tablet (20 mg total) by mouth every evening.  Patient taking differently: Take 40 mg by mouth every evening.   Birdie Sons, MD Taking Active   benzocaine (ORAJEL) 10 % mucosal gel [id number] No Use as directed 1 application in the mouth or throat as needed for mouth pain.  Patient not taking: No sig  reported   [provider] Not Taking Consider Medication Status and Discontinue Self  Cholecalciferol (VITAMIN D PO) [id number] Yes Take 2,000 Units by mouth daily.  [provider] Taking Active Self  Docosanol 10 % CREA [id number] Yes Apply topically up to 5 times per day as needed until heals Mar Daring, PA-C Taking Active Self  empagliflozin (JARDIANCE) 25 MG TABS tablet [id number] Yes Take 25 mg by mouth daily. Birdie Sons, MD Taking Active   Flaxseed, Linseed, (FLAXSEED OIL) 1000 MG CAPS [id number] Yes Take 1,000 mg by mouth 2 (two) times daily. [provider] Taking Active Self  fluticasone (FLONASE) 50 MCG/ACT nasal spray [id number] Yes Place 2 sprays into both nostrils daily.  Patient taking differently: Place 2 sprays into both nostrils 2 (two) times daily.   Birdie Sons, MD Taking Active Self  gabapentin (NEURONTIN) 100 MG capsule [id number] Yes Take 1 capsule (100 mg total) by mouth 2 (two) times daily. Birdie Sons, MD Taking Active   insulin detemir (LEVEMIR) 100 UNIT/ML injection [id number] Yes Inject 0.3 mLs (30 Units total) into the skin 2 (two) times daily.  Patient taking differently: Inject 60 Units into the skin 2 (two) times daily.   [name] Lighter, MD Taking Active Self  levETIRAcetam (KEPPRA) 750 MG tablet [id number] Yes Take 2 tablets (1,500 mg total) by mouth 2 (two) times daily. 3 in the am and 3 at bedtime  Patient taking differently: Take 1,500 mg by mouth 2 (two) times daily.   Marcial Pacas, MD Taking Active   lisinopril (ZESTRIL) 10 MG tablet [id number] Yes Take 5 mg by mouth daily.  [provider] Taking Active Self  loratadine (CLARITIN) 10 MG tablet [id number] Yes Take 10 mg by mouth daily.  [provider] Taking Active Self           Med Note Tamala Julian, JEFFREY W   Thu Nov 07, 2016  4:40 AM)    metFORMIN (GLUCOPHAGE) 850 MG tablet [id number] Yes Take 1 tablet (850 mg total) by mouth 2 (two) times daily with a meal. Birdie Sons, MD Taking Active   Misc Natural Products (OSTEO BI-FLEX TRIPLE STRENGTH PO) [id number] Yes Take 1 tablet by mouth 2 (two) times daily.  [provider] Taking Active Self  MISC NATURAL PRODUCTS PO [id number] Yes Take 1 capsule by mouth in the morning and at bedtime. Super Beta Prostate [provider] Taking Active   montelukast (SINGULAIR) 10 MG tablet [id number] No TAKE ONE TABLET EVERY DAY FOR CHRONIC COUGH  Patient not taking: No sig reported   Birdie Sons, MD Not Taking Active   neomycin-polymyxin-hydrocortisone (CORTISPORIN) otic solution [id number] Yes Place 4 drops into the left ear 4 (four) times daily.  Patient taking differently: Place 4 drops into the left ear 4 (four) times daily. As needed   Carmon Ginsberg, [place] Taking Active Self  Omega-3 Fatty Acids (FISH OIL) 1000 MG CPDR [id number] Yes Take 1,200 mg by mouth 2 (two) times daily.  [provider] Taking Active Self           Med Note Tamala Julian, JEFFREY W   Thu Nov 07, 2016  4:40 AM)    PARoxetine (PAXIL) 40 MG tablet [id number] Yes Take 1 tablet (40 mg total) by mouth daily. Birdie Sons, MD Taking Active   rivaroxaban (XARELTO) 20 MG TABS tablet [id number] Yes Take 1 tablet (20 mg total)  by mouth daily with supper. Birdie Sons, MD Taking Active Self  zolpidem (AMBIEN) 10 MG tablet [id number] Yes TAKE 1/2 TO 1 TABLET AT BEDTIME Birdie Sons, MD Taking Active   Med List Note Gentry Roch, CPhT 09/15/17 1821): The patient get some of his medication from [facility]          Patient Active Problem List   Diagnosis Date Noted  . Diverticulosis   . Paresthesia 04/13/2019  . Cerebrovascular disease 12/29/2017  . Partial seizure ([place]) 02/10/2017  . History of MRSA infection 01/31/2017  . Situational stress 04/16/2016  . Paroxysmal atrial fibrillation ([place]) 03/26/2016  . Vitamin D deficiency 02/28/2016  . Aortic valve stenosis, congenital   . History of CVA (cerebrovascular accident) without residual deficits 02/22/2016  . Diabetes mellitus with neuropathy ([place]) 02/21/2016  . Hyperlipidemia 02/21/2016  . Hypertension 02/21/2016  . History of right-sided carotid endarterectomy 02/21/2016  . Allergic rhinitis 02/21/2016  . B12 deficiency 12/14/2015  . Arthritis 05/25/2015  . Diverticulosis of colon 05/25/2015  . Edema 05/25/2015  . Obstructive sleep apnea of adult 05/25/2015  . Psoriasis 05/25/2015  . RAD (reactive airway disease) 05/25/2015  . TIA (transient ischemic attack) 05/25/2015  . Carotid artery disease ([place]) 03/10/2012  . Morbid obesity ([place]) 01/18/2008  . Insomnia 12/09/2006  . Gouty arthritis 02/07/1999    Immunization History  Administered Date(s) Administered  . Fluad Quad(high Dose 65+) 09/28/2019, 10/11/2020  . Influenza, High Dose Seasonal PF 08/25/2015, 08/01/2017, 08/19/2018  . Influenza-Unspecified 09/23/2014  . PFIZER(Purple Top)SARS-COV-2 Vaccination 02/08/2020, 02/29/2020, 10/03/2020  . Pneumococcal Conjugate-13 11/01/2014  . Pneumococcal Polysaccharide-23 11/15/2008, 04/27/2012, 10/08/2013  . Pneumococcal-Unspecified 07/09/2017  . Tdap 04/17/2010, 07/23/2013, 09/15/2017  . Zoster 09/23/2014    Conditions to be  addressed/monitored:  Hypertension, Hyperlipidemia, Diabetes, Atrial Fibrillation and Allergic Rhinitis  Care Plan : General Pharmacy (Adult)  Updates made by Germaine Pomfret, RPH since 01/16/2021 12:00 AM    Problem: Hypertension, Hyperlipidemia, Diabetes, Atrial Fibrillation and Allergic Rhinitis   Priority: High    Goal: Patient-Specific Goal   Start Date: 01/16/2021  Expected End Date: 07/16/2021  This Visit's Progress: On track  Priority: High  Note:   Current Barriers:  . Unable to independently monitor therapeutic efficacy  Pharmacist Clinical Goal(s):  [name] Over the next 90 days, patient will maintain control of Blood Sugar as evidenced by A1c less than 8%  through collaboration with PharmD and provider.   Interventions: . 1:1 collaboration with Birdie Sons, MD regarding development and update of comprehensive plan of care as evidenced by provider attestation and co-signature . Inter-disciplinary care team collaboration (see longitudinal plan of care) . Comprehensive medication review performed; medication list updated in electronic medical record  Hypertension (BP goal <140/90) -controlled -Current treatment: . Lisinopril 5 mg daily  -Medications previously tried: NA  -Current home readings: NA -Current dietary habits: Drinks 100-120 oz of water daily. Does not follow any specific dietary regimen, but has been trying to eat more salads.  -Current exercise habits: Minimal routine exercise.  -Denies hypotensive/hypertensive symptoms -Educated on Daily salt intake goal < 2300 mg; -Counseled to monitor BP at home weekly, document, and provide log at future appointments -Recommended to continue current medication  Hyperlipidemia: (LDL goal < 70) -controlled -Current treatment: . Atorvastatin 20 mg daily  -Medications previously tried: NA  -Educated on Benefits of statin for ASCVD risk reduction; Importance of limiting foods high in cholesterol; -Recommended to  continue current medication  Diabetes (A1c goal <8%) -controlled -Current medications: . Jardiance 25 mg daily  . Levemir 60 units twice daily (  1u/kg) . Metformin 850 mg twice daily  -Medications previously tried: NA  -Current home glucose readings . fasting glucose: 130-150s  . post prandial glucose: NA -Denies hypoglycemic/hyperglycemic symptoms -Educated onA1c and blood sugar goals; Benefits of routine self-monitoring of blood sugar; -Counseled to check feet daily and get yearly eye exams -Recommended to continue current medication  Atrial Fibrillation (Goal: prevent stroke and major bleeding) -controlled  -CHADSVASC: 7 -Current treatment: . Rate control: None . Anticoagulation: Xarelto 20 mg daily  -Medications previously tried: NA -Counseled on importance of adherence to anticoagulant exactly as prescribed; -Recommended to continue current medication   Patient Goals/Self-Care Activities . Over the next 90 days, patient will:  - check glucose daily before breakfast, document, and provide at future appointments check blood pressure weekly, document, and provide at future appointments  Follow Up Plan: Telephone follow up appointment with care management team member scheduled for: 07/16/21 at 1:00 PM       Medication Assistance: None required.  Patient affirms current coverage meets needs. The majority of the patient's medications are covered through the [employer].  Patient's preferred pharmacy is:  [facility] - [place], [place] - [address] [place] [address] Phone: [phone] Fax: [phone]  Uses pill box? Yes Pt endorses 100% compliance  We discussed: Current pharmacy is preferred with insurance plan and patient is satisfied with pharmacy services Patient decided to: Continue current medication management strategy  Follow Up:  Patient agrees to Care Plan and Follow-up.  Plan: Telephone follow up appointment with care  management team member scheduled for:  07/16/21 at 2:00 PM  [place] [phone]

## 2021-02-13 ENCOUNTER — Other Ambulatory Visit: Payer: Self-pay | Admitting: Family Medicine

## 2021-02-13 DIAGNOSIS — F439 Reaction to severe stress, unspecified: Secondary | ICD-10-CM

## 2021-02-13 NOTE — Telephone Encounter (Signed)
   Notes to clinic: Patient had medicare wellness on 12/28/2020 Protocol failed for 6 month follow up  Review for refill    Requested Prescriptions  Pending Prescriptions Disp Refills   PARoxetine (PAXIL) 40 MG tablet [Pharmacy Med Name: PAROXETINE HCL 40 MG TAB] 90 tablet 3    Sig: TAKE 1 TABLET BY MOUTH DAILY      Psychiatry:  Antidepressants - SSRI Failed - 02/13/2021  8:59 AM      Failed - Valid encounter within last 6 months    Recent Outpatient Visits           9 months ago Annual physical exam   [facility] Birdie Sons, MD   10 months ago [place], Wendee Beavers, [place]   1 year ago Wound infection   [facility] [place], Wendee Beavers, [place]   1 year ago Annual physical exam   [facility] Birdie Sons, MD   2 years ago Abscess   [facility] [place], Dionne Bucy, MD

## 2021-02-14 DIAGNOSIS — E119 Type 2 diabetes mellitus without complications: Secondary | ICD-10-CM | POA: Diagnosis not present

## 2021-02-14 DIAGNOSIS — Z0100 Encounter for examination of eyes and vision without abnormal findings: Secondary | ICD-10-CM | POA: Diagnosis not present

## 2021-02-14 LAB — HM DIABETES EYE EXAM

## 2021-02-19 NOTE — Telephone Encounter (Signed)
Courtesy refill. Called patient to schedule appt. No answer. LVMTCB.

## 2021-04-02 ENCOUNTER — Other Ambulatory Visit: Payer: Self-pay | Admitting: Family Medicine

## 2021-04-02 DIAGNOSIS — G4700 Insomnia, unspecified: Secondary | ICD-10-CM

## 2021-04-02 NOTE — Telephone Encounter (Signed)
Requested medication (s) are due for refill today: yes  Requested medication (s) are on the active medication list: yes  Last refill:  09/27/20 #90 1 refills  Future visit scheduled: no  Notes to clinic:  not delegate per protocol, attempted to contact patient to schedule appt. No answer, left voicemail to call clinic back.      Requested Prescriptions  Pending Prescriptions Disp Refills   zolpidem (AMBIEN) 10 MG tablet [Pharmacy Med Name: ZOLPIDEM TARTRATE 10 MG TAB] 90 tablet     Sig: TAKE 1/2 TO 1 TABLET AT BEDTIME      Not Delegated - Psychiatry:  Anxiolytics/Hypnotics Failed - 04/02/2021 12:44 PM      Failed - This refill cannot be delegated      Failed - Urine Drug Screen completed in last 360 days      Failed - Valid encounter within last 6 months    Recent Outpatient Visits           10 months ago Annual physical exam   [facility] Birdie Sons, MD   12 months ago [place], Wendee Beavers, [place]   1 year ago Wound infection   [facility] [place], Wendee Beavers, [place]   1 year ago Annual physical exam   [facility] Birdie Sons, MD   2 years ago Abscess   [facility] [place], Dionne Bucy, MD

## 2021-06-19 ENCOUNTER — Other Ambulatory Visit: Payer: Self-pay | Admitting: Family Medicine

## 2021-06-19 ENCOUNTER — Other Ambulatory Visit: Payer: Self-pay

## 2021-06-19 DIAGNOSIS — G4700 Insomnia, unspecified: Secondary | ICD-10-CM

## 2021-06-19 DIAGNOSIS — F439 Reaction to severe stress, unspecified: Secondary | ICD-10-CM

## 2021-06-19 NOTE — Telephone Encounter (Signed)
Patient notified and appointment has been rescheduled to 07/18/2021 at 8:40am with Dr. Caryn Section. Patient needs a refill on Ambien. He only has 6 pills left.   [facility]

## 2021-06-19 NOTE — Telephone Encounter (Signed)
I tried calling patient to reschedule appointment. Patients has an upcomming appointment on 06/20/2021 at 10:40 with Dr. Rosanna Randy. This appointment needs to be rescheduled to Dr. Maralyn Sago schedule on a different day. Dr. Maralyn Sago next available appointment is probably in August, but we can give patient a refill to get by until that time.  Please route call back to the office when patient returns call for rescheduling.

## 2021-06-19 NOTE — Telephone Encounter (Signed)
Requested medication (s) are due for refill today: yes   Requested medication (s) are on the active medication list:  yes  Last refill:  05/10/2021  Future visit scheduled: yes   Notes to clinic: due for 6 month follow up    Requested Prescriptions  Pending Prescriptions Disp Refills   PARoxetine (PAXIL) 40 MG tablet [Pharmacy Med Name: PAROXETINE HCL 40 MG TAB] 30 tablet 0    Sig: TAKE 1 TABLET BY MOUTH DAILY      Psychiatry:  Antidepressants - SSRI Failed - 06/19/2021  8:34 AM      Failed - Valid encounter within last 6 months    Recent Outpatient Visits           1 year ago Annual physical exam   [facility] Birdie Sons, MD   1 year ago [facility] [place], [place]   1 year ago Wound infection   [facility] [place], Wendee Beavers, [place]   2 years ago Annual physical exam   [facility] Birdie Sons, MD   2 years ago Abscess   [facility] [place], Dionne Bucy, MD

## 2021-06-20 ENCOUNTER — Telehealth: Payer: Self-pay

## 2021-06-20 ENCOUNTER — Ambulatory Visit: Payer: Medicare HMO | Admitting: Family Medicine

## 2021-06-20 MED ORDER — ZOLPIDEM TARTRATE 10 MG PO TABS: ORAL_TABLET | ORAL | 0 refills | Status: DC

## 2021-06-20 NOTE — Telephone Encounter (Signed)
LMTCB 06/20/2021.  PEC please schedule apt if pt calls back.   Thanks,   -Mickel Baas

## 2021-06-20 NOTE — Telephone Encounter (Signed)
Copied from [place] [phone]. Topic: Conservator, museum/gallery Patient (Clinic Use ONLY) >> Jun 19, 2021  2:13 PM Randal Buba, [place] wrote: Reason for CRM: Patients upcomming appointment on 06/20/2021 needs to be rescheduled to Dr. Maralyn Sago schedule on a different day. Please route call back to the office when patient returns call. >> Jun 19, 2021  2:24 PM Celene Kras wrote: Pt returning call. Attempted to contact office with no response x3. Please advise.

## 2021-07-04 DIAGNOSIS — K134 Granuloma and granuloma-like lesions of oral mucosa: Secondary | ICD-10-CM | POA: Diagnosis not present

## 2021-07-13 ENCOUNTER — Telehealth: Payer: Self-pay

## 2021-07-13 NOTE — Progress Notes (Signed)
    Chronic Care Management Pharmacy Assistant   Name: Sean Hayden  MRN: [id number] DOB: [birth year]  Patient called to be reminded of his appointment with Junius Argyle, CPP on 07/16/2021 @ 1400 via telephone  No answer, left message of appointment date, time and type of appointment (either telephone or in person). Left message to have all medications, supplements, blood pressure and/or blood sugar logs available during appointment and to return call if need to reschedule.  Star Rating Drug: Lisinopril 10 mg No record of patient filling this medication Atorvastatin 20 mg No record of patient filling this medication Jardiance 20 mg last filled on 03/07/2020 for a 30-Day supply with [facility] Metformin 850 mg No record of patient filling this medication  Any gaps in medications fill history? Yes  Care Gaps: Zoster Vaccines- Shingrix FOOT EXAM (Last completed 09/08/2016) HEMOGLOBIN A1C (Last completed 05/16/2020) COVID-19 Vaccine Booster 4 Fecal DNA (Cologuard) (Last completed 02/20/2018) INFLUENZA VACCINE (Last completed 10/11/2020)   Lynann Bologna, CPA/CMA Clinical Pharmacist Assistant Phone: [phone]

## 2021-07-16 ENCOUNTER — Telehealth: Payer: Self-pay

## 2021-07-16 NOTE — Progress Notes (Deleted)
Chronic Care Management Pharmacy Note  07/16/2021 Name:  Sean Hayden MRN:  [id number] DOB:  [birth year]  Subjective: Sean Hayden is an 75 y.o. year old male who is a primary patient of Fisher, Kirstie Peri, MD.  The CCM team was consulted for assistance with disease management and care coordination needs.    Engaged with patient by telephone for follow up visit in response to provider referral for pharmacy case management and/or care coordination services.   Consent to Services:  The patient was given information about Chronic Care Management services, agreed to services, and gave verbal consent prior to initiation of services.  Please see initial visit note for detailed documentation.   Patient Care Team: Birdie Sons, MD as PCP - General (Family Medicine) Marcial Pacas, MD as Consulting Physician (Neurology) Pa, [facility]) [facility], [place] as Referring Physician ([place]) Germaine Pomfret, [facility] as Pharmacist (Pharmacist) Eulogio Bear, MD as Consulting Physician (Ophthalmology) Thompson Grayer, MD as Consulting Physician (Cardiology)  Recent office visits: 12/28/20: Patient presented to [facility], LPN for AWV.  04/10/28: Patient presented to Dr. Caryn Section for follow-up. A1c stable at 7.7%. Paroxetine increased to 40 mg daily. Vitamin D 1000 units daily started.  Recent consult visits: None noted in past 6 months.   Objective:  Lab Results  Component Value Date   CREATININE 0.8 02/07/2020   BUN 13 02/07/2020   GFRNONAA 100.4 02/07/2020   GFRAA >60 09/23/2017   NA 139 02/07/2020   K 4.3 02/07/2020   CALCIUM 9.3 02/07/2020   CO2 24 09/23/2017    Lab Results  Component Value Date/Time   HGBA1C 7.6 (H) 05/16/2020 10:33 AM   HGBA1C 7.7 02/07/2020 12:00 AM   HGBA1C 7.1 (A) 06/08/2018 08:48 AM   HGBA1C 9.7 02/02/2018 08:46 AM   HGBA1C 7.2 11/06/2017 12:00 AM   MICROALBUR 15 02/07/2020 12:00 AM   MICROALBUR 20 02/02/2018  08:46 AM    Last diabetic Eye exam:  Lab Results  Component Value Date/Time   HMDIABEYEEXA No Retinopathy 02/14/2021 12:00 AM    Last diabetic Foot exam: No results found for: HMDIABFOOTEX   Lab Results  Component Value Date   CHOL 123 02/07/2020   HDL 37 02/07/2020   LDLCALC 49 02/07/2020   TRIG 184 (A) 02/07/2020   CHOLHDL 3.3 02/21/2016    Hepatic Function Latest Ref Rng & Units 02/07/2020 09/23/2017 01/23/2017  Total Protein 6.5 - 8.1 g/dL - 7.5 -  Albumin 3.5 - 5.0 g/dL - 3.9 -  AST 14 - 40 '24 23 14  ' ALT 10 - 40 36 20 20  Alk Phosphatase 25 - 125 99 76 -  Total Bilirubin 0.3 - 1.2 mg/dL - 0.4 -    Lab Results  Component Value Date/Time   TSH 1.82 11/23/2015 12:00 AM   TSH 3.07 08/24/2013 12:00 AM    CBC Latest Ref Rng & Units 05/16/2020 10/04/2017 09/29/2017  WBC 3.4 - 10.8 x10E3/uL 6.2 5.8 -  Hemoglobin 13.0 - 17.7 g/dL 15.5 10.0(L) 10.9(L)  Hematocrit 37.5 - 51.0 % 46.8 30.1(L) 32.0(L)  Platelets 150 - 450 x10E3/uL 180 196 -    Lab Results  Component Value Date/Time   VD25OH 23.5 (L) 05/16/2020 10:33 AM   VD25OH 45.5 02/28/2016 09:16 AM    Clinical ASCVD: No  The ASCVD Risk score Mikey Bussing DC Jr., et al., 2013) failed to calculate for the following reasons:   The systolic blood pressure is missing   The  valid total cholesterol range is 130 to 320 mg/dL    Depression screen [facility] 2/9 12/28/2020 12/27/2019 12/27/2019  Decreased Interest 0 0 0  Down, Depressed, Hopeless 0 0 0  PHQ - 2 Score 0 0 0  Altered sleeping - - -  Tired, decreased energy - - -  Change in appetite - - -  Feeling bad or failure about yourself  - - -  Trouble concentrating - - -  Moving slowly or fidgety/restless - - -  Suicidal thoughts - - -  PHQ-9 Score - - -  Difficult doing work/chores - - -     Social History   Tobacco Use  Smoking Status Former   Types: Cigars   Quit date: 04/08/2012   Years since quitting: 9.2  Smokeless Tobacco Never  Tobacco Comments   cigarettes and cigars    BP Readings from Last 3 Encounters:  05/16/20 121/69  04/06/20 (!) 156/79  05/12/19 128/70   Pulse Readings from Last 3 Encounters:  05/16/20 76  04/06/20 97  05/12/19 79   Wt Readings from Last 3 Encounters:  05/16/20 252 lb (114.3 kg)  04/06/20 251 lb 3.2 oz (113.9 kg)  05/12/19 242 lb (109.8 kg)    Assessment/Interventions: Review of patient past medical history, allergies, medications, health status, including review of consultants reports, laboratory and other test data, was performed as part of comprehensive evaluation and provision of chronic care management services.   SDOH:  (Social Determinants of Health) assessments and interventions performed: Yes    CCM Care Plan  Allergies  Allergen Reactions   Actos [Pioglitazone]     UNSPECIFIED REACTION     Medications Reviewed Today     Reviewed by Germaine Pomfret, [facility] (Pharmacist) on 01/16/21 at 1421  Med List Status: <None>   Medication Order Taking? Sig Documenting Provider Last Dose Status Informant  acyclovir (ZOVIRAX) 400 MG tablet [id number]  1/2 tablet up to 5 times a day for cold sores  Patient not taking: No sig reported   Birdie Sons, MD  Active   albuterol (VENTOLIN HFA) 108 (90 Base) MCG/ACT inhaler [id number]  Inhale 2 puffs into the lungs every 6 (six) hours as needed for wheezing or shortness of breath. [provider]  Active   atorvastatin (LIPITOR) 20 MG tablet [id number]  Take 1 tablet (20 mg total) by mouth every evening.  Patient taking differently: Take 40 mg by mouth every evening.   Birdie Sons, MD  Active   benzocaine (ORAJEL) 10 % mucosal gel [id number]  Use as directed 1 application in the mouth or throat as needed for mouth pain.  Patient not taking: No sig reported   [provider]  Active Self  Cholecalciferol (VITAMIN D PO) [id number]  Take 2,000 Units by mouth daily.  [provider]  Active Self  Docosanol 10 % CREA [id number]  Apply topically up  to 5 times per day as needed until heals Mar Daring, PA-C  Active Self  empagliflozin (JARDIANCE) 25 MG TABS tablet [id number]  Take 25 mg by mouth daily. Birdie Sons, MD  Active   Flaxseed, Linseed, (FLAXSEED OIL) 1000 MG CAPS [id number]  Take 1,000 mg by mouth 2 (two) times daily. [provider]  Active Self  fluticasone (FLONASE) 50 MCG/ACT nasal spray [id number]  Place 2 sprays into both nostrils daily.  Patient taking differently: Place 2 sprays into both nostrils 2 (two) times daily.   Birdie Sons, MD  Active Self  gabapentin (NEURONTIN) 100 MG capsule [id number]  Take 1 capsule (100 mg total) by mouth 2 (two) times daily. Birdie Sons, MD  Active   insulin detemir (LEVEMIR) 100 UNIT/ML injection [id number]  Inject 0.3 mLs (30 Units total) into the skin 2 (two) times daily.  Patient taking differently: Inject 60 Units into the skin 2 (two) times daily.   [name] Lighter, MD  Active Self  levETIRAcetam (KEPPRA) 750 MG tablet [id number]  Take 2 tablets (1,500 mg total) by mouth 2 (two) times daily. 3 in the am and 3 at bedtime  Patient taking differently: Take 1,500 mg by mouth 2 (two) times daily.   Marcial Pacas, MD  Active   lisinopril (ZESTRIL) 10 MG tablet [id number]  Take 5 mg by mouth daily.  [provider]  Active Self  loratadine (CLARITIN) 10 MG tablet [id number]  Take 10 mg by mouth daily.  [provider]  Active Self           Med Note Tamala Julian, JEFFREY W   Thu Nov 07, 2016  4:40 AM)    metFORMIN (GLUCOPHAGE) 850 MG tablet [id number]  Take 1 tablet (850 mg total) by mouth 2 (two) times daily with a meal. Birdie Sons, MD  Active   Misc Natural Products (OSTEO BI-FLEX TRIPLE STRENGTH PO) [id number] Yes Take 1 tablet by mouth 2 (two) times daily.  [provider] Taking Active Self  MISC NATURAL PRODUCTS PO [id number]  Take 1 capsule by mouth in the morning and at bedtime. Super Beta Prostate [provider]  Active    montelukast (SINGULAIR) 10 MG tablet [id number]  TAKE ONE TABLET EVERY DAY FOR CHRONIC COUGH  Patient not taking: No sig reported   Birdie Sons, MD  Active   neomycin-polymyxin-hydrocortisone (CORTISPORIN) otic solution [id number]  Place 4 drops into the left ear 4 (four) times daily.  Patient taking differently: Place 4 drops into the left ear 4 (four) times daily. As needed   Carmon Ginsberg, [place]  Active Self  Omega-3 Fatty Acids (FISH OIL) 1000 MG CPDR [id number]  Take 1,200 mg by mouth 2 (two) times daily.  [provider]  Active Self           Med Note Tamala Julian, JEFFREY W   Thu Nov 07, 2016  4:40 AM)    PARoxetine (PAXIL) 40 MG tablet [id number]  Take 1 tablet (40 mg total) by mouth daily. Birdie Sons, MD  Active   rivaroxaban (XARELTO) 20 MG TABS tablet [id number]  Take 1 tablet (20 mg total) by mouth daily with supper. Birdie Sons, MD  Active Self  zolpidem (AMBIEN) 10 MG tablet [id number]  TAKE 1/2 TO 1 TABLET AT BEDTIME Birdie Sons, MD  Active   Med List Note Gentry Roch, CPhT 09/15/17 1821): The patient get some of his medication from [facility]            Patient Active Problem List   Diagnosis Date Noted   Diverticulosis    Paresthesia 04/13/2019   Cerebrovascular disease 12/29/2017   Partial seizure ([place]) 02/10/2017   History of MRSA infection 01/31/2017   Situational stress 04/16/2016   Paroxysmal atrial fibrillation ([place]) 03/26/2016   Vitamin D deficiency 02/28/2016   Aortic valve stenosis, congenital    History of CVA (cerebrovascular accident) without residual deficits 02/22/2016   Diabetes mellitus with neuropathy ([place]) 02/21/2016   Hyperlipidemia 02/21/2016   Hypertension 02/21/2016   History of right-sided carotid endarterectomy 02/21/2016   Allergic  rhinitis 02/21/2016   B12 deficiency 12/14/2015   Arthritis 05/25/2015   Diverticulosis of colon 05/25/2015   Edema 05/25/2015   Obstructive sleep apnea of adult 05/25/2015   Psoriasis  05/25/2015   RAD (reactive airway disease) 05/25/2015   TIA (transient ischemic attack) 05/25/2015   Carotid artery disease ([place]) 03/10/2012   Morbid obesity ([place]) 01/18/2008   Insomnia 12/09/2006   Gouty arthritis 02/07/1999    Immunization History  Administered Date(s) Administered   Fluad Quad(high Dose 65+) 09/28/2019, 10/11/2020   Influenza, High Dose Seasonal PF 08/25/2015, 08/01/2017, 08/19/2018   Influenza-Unspecified 09/23/2014   PFIZER(Purple Top)SARS-COV-2 Vaccination 02/08/2020, 02/29/2020, 10/03/2020   Pneumococcal Conjugate-13 11/01/2014   Pneumococcal Polysaccharide-23 11/15/2008, 04/27/2012, 10/08/2013   Pneumococcal-Unspecified 07/09/2017   Tdap 04/17/2010, 07/23/2013, 09/15/2017   Zoster, Live 09/23/2014    Conditions to be addressed/monitored:  Hypertension, Hyperlipidemia, Diabetes, Atrial Fibrillation and Allergic Rhinitis  There are no care plans that you recently modified to display for this patient.    Medication Assistance: None required.  Patient affirms current coverage meets needs. The majority of the patient's medications are covered through the [place].  Patient's preferred pharmacy is:  [facility] - [place], [place] - [address] [place] [address] Phone: [phone] Fax: [phone]  Uses pill box? Yes Pt endorses 100% compliance  We discussed: Current pharmacy is preferred with insurance plan and patient is satisfied with pharmacy services Patient decided to: Continue current medication management strategy  Follow Up:  Patient agrees to Care Plan and Follow-up.  Plan: Telephone follow up appointment with care management team member scheduled for:  07/16/21 at 2:00 PM  [facility] [phone]  Current Barriers:  Unable to independently monitor therapeutic efficacy  Pharmacist Clinical Goal(s):  Over the next 90 days, patient will maintain  control of Blood Sugar as evidenced by A1c less than 8%  through collaboration with PharmD and provider.   Interventions: 1:1 collaboration with Birdie Sons, MD regarding development and update of comprehensive plan of care as evidenced by provider attestation and co-signature Inter-disciplinary care team collaboration (see longitudinal plan of care) Comprehensive medication review performed; medication list updated in electronic medical record  Hypertension (BP goal <140/90) -controlled -Current treatment: Lisinopril 5 mg daily  -Medications previously tried: NA  -Current home readings: NA -Current dietary habits: Drinks 100-120 oz of water daily. Does not follow any specific dietary regimen, but has been trying to eat more salads.  -Current exercise habits: Minimal routine exercise.  -Denies hypotensive/hypertensive symptoms -Educated on Daily salt intake goal < 2300 mg; -Counseled to monitor BP at home weekly, document, and provide log at future appointments -Recommended to continue current medication  Hyperlipidemia: (LDL goal < 70) -controlled -Current treatment: Atorvastatin 20 mg daily  -Medications previously tried: NA  -Educated on Benefits of statin for ASCVD risk reduction; Importance of limiting foods high in cholesterol; -Recommended to continue current medication  Diabetes (A1c goal <8%) -controlled -Current medications: Jardiance 25 mg daily  Levemir 60 units twice daily (1u/kg) Metformin 850 mg twice daily  -Medications previously tried: NA  -Current home glucose readings fasting glucose: 130-150s  post prandial glucose: NA -Denies hypoglycemic/hyperglycemic symptoms -Educated on A1c and blood sugar goals; Benefits of routine self-monitoring of blood sugar; -Counseled to check feet daily and get yearly eye exams -Recommended to continue current medication  Atrial Fibrillation (Goal: prevent stroke and major bleeding) -controlled  -CHADSVASC:  7 -Current treatment: Rate control: None Anticoagulation:  Xarelto 20 mg daily  -Medications previously tried: NA -Counseled on importance of adherence to anticoagulant exactly as prescribed; -Recommended to continue current medication   Patient Goals/Self-Care Activities Over the next 90 days, patient will:  - check glucose daily before breakfast, document, and provide at future appointments check blood pressure weekly, document, and provide at future appointments  Follow Up Plan: ***

## 2021-07-18 ENCOUNTER — Encounter: Payer: Self-pay | Admitting: Family Medicine

## 2021-07-18 ENCOUNTER — Other Ambulatory Visit: Payer: Self-pay

## 2021-07-18 ENCOUNTER — Ambulatory Visit (INDEPENDENT_AMBULATORY_CARE_PROVIDER_SITE_OTHER): Payer: Medicare HMO | Admitting: Family Medicine

## 2021-07-18 VITALS — BP 124/71 | HR 71 | Temp 97.6°F | Resp 16 | Wt 244.0 lb

## 2021-07-18 DIAGNOSIS — I779 Disorder of arteries and arterioles, unspecified: Secondary | ICD-10-CM

## 2021-07-18 DIAGNOSIS — E1140 Type 2 diabetes mellitus with diabetic neuropathy, unspecified: Secondary | ICD-10-CM

## 2021-07-18 DIAGNOSIS — Z794 Long term (current) use of insulin: Secondary | ICD-10-CM

## 2021-07-18 DIAGNOSIS — E559 Vitamin D deficiency, unspecified: Secondary | ICD-10-CM

## 2021-07-18 DIAGNOSIS — I10 Essential (primary) hypertension: Secondary | ICD-10-CM

## 2021-07-18 DIAGNOSIS — G4700 Insomnia, unspecified: Secondary | ICD-10-CM

## 2021-07-18 DIAGNOSIS — E785 Hyperlipidemia, unspecified: Secondary | ICD-10-CM

## 2021-07-18 DIAGNOSIS — R569 Unspecified convulsions: Secondary | ICD-10-CM

## 2021-07-18 DIAGNOSIS — F439 Reaction to severe stress, unspecified: Secondary | ICD-10-CM | POA: Diagnosis not present

## 2021-07-18 DIAGNOSIS — Z125 Encounter for screening for malignant neoplasm of prostate: Secondary | ICD-10-CM | POA: Diagnosis not present

## 2021-07-18 DIAGNOSIS — E538 Deficiency of other specified B group vitamins: Secondary | ICD-10-CM | POA: Diagnosis not present

## 2021-07-18 DIAGNOSIS — I480 Paroxysmal atrial fibrillation: Secondary | ICD-10-CM

## 2021-07-18 MED ORDER — SHINGRIX 50 MCG/0.5ML IM SUSR: 0.5000 mL | Freq: Once | INTRAMUSCULAR | 0 refills | Status: DC

## 2021-07-18 NOTE — Progress Notes (Signed)
Established patient visit   Patient: Sean Hayden   DOB: 08/11/1946   75 y.o. Male  MRN: [id number] Visit Date: 07/18/2021  Today's healthcare provider: Lelon Huh, MD   Chief Complaint  Patient presents with   Diabetes    Subjective    HPI  Diabetes Mellitus Type II, Follow-up  Lab Results  Component Value Date   HGBA1C 7.6 (H) 05/16/2020   HGBA1C 7.7 02/07/2020   HGBA1C 7.1 (A) 06/08/2018   Wt Readings from Last 3 Encounters:  07/18/21 244 lb (110.7 kg)  05/16/20 252 lb (114.3 kg)  04/06/20 251 lb 3.2 oz (113.9 kg)   Last seen for diabetes 1  year  ago.  Management since then includes continue same dose of medication. Patient has been seeing the [facility] for his Diabetes. He states the last visit with the [facility] was over 4-5 months ago. He reports good compliance with treatment. He is not having side effects.  Symptoms: Yes fatigue No foot ulcerations  No appetite changes No nausea  No paresthesia of the feet  No polydipsia  No polyuria No visual disturbances   No vomiting     Home blood sugar records: fasting range: 150's  Episodes of hypoglycemia? No    Current insulin regiment: Levemir 60 units twice daily Most Recent Eye Exam: 03/09/202 Current exercise: none Current diet habits: in general, an "unhealthy" diet  Pertinent Labs: Lab Results  Component Value Date   CHOL 123 02/07/2020   HDL 37 02/07/2020   LDLCALC 49 02/07/2020   TRIG 184 (A) 02/07/2020   CHOLHDL 3.3 02/21/2016   Lab Results  Component Value Date   NA 139 02/07/2020   K 4.3 02/07/2020   CREATININE 0.8 02/07/2020   GFRNONAA 100.4 02/07/2020   GFRAA >60 09/23/2017   GLUCOSE 282 (H) 09/29/2017     ---------------------------------------------------------------------------------------------------   Hypertension, follow-up  BP Readings from Last 3 Encounters:  07/18/21 124/71  05/16/20 121/69  04/06/20 (!) 156/79   Wt Readings from Last 3 Encounters:  07/18/21 244 lb  (110.7 kg)  05/16/20 252 lb (114.3 kg)  04/06/20 251 lb 3.2 oz (113.9 kg)     He was last seen for hypertension 1  year  ago.  BP at that visit was 121/69. Management since that visit includes continue same medication.  He reports good compliance with treatment. He is not having side effects.  He is following a Regular diet. He is not exercising. He does not smoke.  Use of agents associated with hypertension: none.   Outside blood pressures are not checked. Symptoms: No chest pain No chest pressure  No palpitations No syncope  No dyspnea No orthopnea  No paroxysmal nocturnal dyspnea Yes lower extremity edema   Pertinent labs: Lab Results  Component Value Date   CHOL 123 02/07/2020   HDL 37 02/07/2020   LDLCALC 49 02/07/2020   TRIG 184 (A) 02/07/2020   CHOLHDL 3.3 02/21/2016   Lab Results  Component Value Date   NA 139 02/07/2020   K 4.3 02/07/2020   CREATININE 0.8 02/07/2020   GFRNONAA 100.4 02/07/2020   GFRAA >60 09/23/2017   GLUCOSE 282 (H) 09/29/2017     The ASCVD Risk score Mikey Bussing DC Jr., et al., 2013) failed to calculate for the following reasons:   The valid total cholesterol range is 130 to 320 mg/dL   ---------------------------------------------------------------------------------------------------   Follow up for Vitamin D Deficiency:  The patient was last seen for this 1  year  ago. Changes made at last visit include advising patient to double the dose of Vitamin D supplement.  He reports good compliance with treatment. Patient currently takes 2,000 units of Vitamin D supplements daily. He feels that condition is Unchanged. He is not having side effects.   Lab Results  Component Value Date   VD25OH 23.5 (L) 05/16/2020     -----------------------------------------------------------------------------------------   Situational stress, Follow-up  He was last seen for this problem 1  year  ago. Changes made at last visit include changing Paxil to  '40mg'$  one full tablet daily.   He reports good compliance with treatment. He reports good tolerance of treatment. He is not having side effects.   He feels his anxiety is mild and Improved since last visit.  Symptoms: No chest pain No difficulty concentrating  No dizziness No fatigue  No feelings of losing control No insomnia  No irritable No palpitations  No panic attacks No racing thoughts  No shortness of breath No sweating  No tremors/shakes    GAD-7 Results No flowsheet data found.  PHQ-9 Scores PHQ9 SCORE ONLY 07/18/2021 12/28/2020 12/27/2019  PHQ-9 Total Score 12 0 0    ---------------------------------------------------------------------------------------------------      Medications: Outpatient Medications Prior to Visit  Medication Sig   acyclovir (ZOVIRAX) 400 MG tablet 1/2 tablet up to 5 times a day for cold sores   albuterol (VENTOLIN HFA) 108 (90 Base) MCG/ACT inhaler Inhale 2 puffs into the lungs every 6 (six) hours as needed for wheezing or shortness of breath.   atorvastatin (LIPITOR) 20 MG tablet Take 1 tablet (20 mg total) by mouth every evening. (Patient taking differently: Take 40 mg by mouth every evening.)   benzocaine (ORAJEL) 10 % mucosal gel Use as directed 1 application in the mouth or throat as needed for mouth pain.   Cholecalciferol (VITAMIN D PO) Take 2,000 Units by mouth daily.    Docosanol 10 % CREA Apply topically up to 5 times per day as needed until heals   empagliflozin (JARDIANCE) 25 MG TABS tablet Take 25 mg by mouth daily.   Flaxseed, Linseed, (FLAXSEED OIL) 1000 MG CAPS Take 1,000 mg by mouth 2 (two) times daily.   fluticasone (FLONASE) 50 MCG/ACT nasal spray Place 2 sprays into both nostrils daily. (Patient taking differently: Place 2 sprays into both nostrils 2 (two) times daily.)   gabapentin (NEURONTIN) 100 MG capsule Take 1 capsule (100 mg total) by mouth 2 (two) times daily.   insulin detemir (LEVEMIR) 100 UNIT/ML injection Inject  0.3 mLs (30 Units total) into the skin 2 (two) times daily. (Patient taking differently: Inject 60 Units into the skin 2 (two) times daily.)   levETIRAcetam (KEPPRA) 750 MG tablet Take 2 tablets (1,500 mg total) by mouth 2 (two) times daily. 3 in the am and 3 at bedtime (Patient taking differently: Take 1,500 mg by mouth 2 (two) times daily.)   lisinopril (ZESTRIL) 10 MG tablet Take 5 mg by mouth daily.    loratadine (CLARITIN) 10 MG tablet Take 10 mg by mouth daily.    metFORMIN (GLUCOPHAGE) 850 MG tablet Take 1 tablet (850 mg total) by mouth 2 (two) times daily with a meal.   Misc Natural Products (OSTEO BI-FLEX TRIPLE STRENGTH PO) Take 1 tablet by mouth 2 (two) times daily.    MISC NATURAL PRODUCTS PO Take 1 capsule by mouth in the morning and at bedtime. Super Beta Prostate   montelukast (SINGULAIR) 10 MG tablet TAKE ONE TABLET EVERY  DAY FOR CHRONIC COUGH   neomycin-polymyxin-hydrocortisone (CORTISPORIN) otic solution Place 4 drops into the left ear 4 (four) times daily. (Patient taking differently: Place 4 drops into the left ear 4 (four) times daily. As needed)   Omega-3 Fatty Acids (FISH OIL) 1000 MG CPDR Take 1,200 mg by mouth 2 (two) times daily.    PARoxetine (PAXIL) 40 MG tablet Take 1 tablet (40 mg total) by mouth daily.   rivaroxaban (XARELTO) 20 MG TABS tablet Take 1 tablet (20 mg total) by mouth daily with supper.   tamsulosin (FLOMAX) 0.4 MG CAPS capsule Take 1 capsule by mouth daily.   zolpidem (AMBIEN) 10 MG tablet TAKE 1/2 TO 1 TABLET AT BEDTIME   No facility-administered medications prior to visit.    Review of Systems  Constitutional:  Negative for appetite change, chills and fever.  Respiratory:  Negative for chest tightness, shortness of breath and wheezing.   Cardiovascular:  Negative for palpitations.  Gastrointestinal:  Negative for abdominal pain, nausea and vomiting.      Objective    BP 124/71 (BP Location: Left Arm, Patient Position: Sitting, Cuff Size: Large)    Pulse 71   Temp 97.6 F (36.4 C) (Temporal)   Resp 16   Wt 244 lb (110.7 kg)   BMI 40.60 kg/m     Physical Exam  General appearance: Severely obese male, cooperative and in no acute distress Head: Normocephalic, without obvious abnormality, atraumatic Respiratory: Respirations even and unlabored, normal respiratory rate Extremities: All extremities are intact.  Skin: Skin color, texture, turgor normal. No rashes seen  Psych: Appropriate mood and affect. Neurologic: Mental status: Alert, oriented to person, place, and time, thought content appropriate.     Assessment & Plan     1. Situational stress Doing much better since being on paroxetine. Continue current medication.   2. Prostate cancer screening  - PSA Total (Reflex To Free) ([facility] only)  3. Primary hypertension Well controlled.  .ccm  - TSH  4. Type 2 diabetes mellitus with diabetic neuropathy, with long-term current use of insulin ([place]) Has been followed at [facility] but not had labs in several months.  - Hemoglobin A1c  5. B12 deficiency  - Vitamin B12  6. Vitamin D deficiency  - VITAMIN D 25 Hydroxy (Vit-D Deficiency, Fractures)  7. Hyperlipidemia, unspecified hyperlipidemia type He is tolerating atorvastatin well with no adverse effects.   - CBC - Comprehensive metabolic panel - Lipid panel  8. Insomnia, unspecified type Doing very with zolpidem.   9. Carotid artery disease, unspecified laterality, unspecified type ([place]) Asymptomatic. Compliant with medication.  Continue aggressive risk factor modification.    10. Paroxysmal atrial fibrillation ([facility]) On NOAC, rate well controlled.   11. Morbid obesity ([place]) Continue to work on weight loss with healthy diet and exercise.   12. Partial seizure ([place]) No recent seizures and doing well with levetiracetam.       The entirety of the information documented in the History of Present Illness, Review of Systems and Physical Exam were personally  obtained by me. Portions of this information were initially documented by the CMA and reviewed by me for thoroughness and accuracy.     Lelon Huh, MD  [facility] [phone] (phone) [phone] (fax)  [place]

## 2021-07-19 LAB — COMPREHENSIVE METABOLIC PANEL
ALT: 19 IU/L (ref 0–44)
AST: 17 IU/L (ref 0–40)
Albumin/Globulin Ratio: 1.8 (ref 1.2–2.2)
Albumin: 4.6 g/dL (ref 3.7–4.7)
Alkaline Phosphatase: 105 IU/L (ref 44–121)
BUN/Creatinine Ratio: 24 (ref 10–24)
BUN: 22 mg/dL (ref 8–27)
Bilirubin Total: 0.4 mg/dL (ref 0.0–1.2)
CO2: 22 mmol/L (ref 20–29)
Calcium: 10.1 mg/dL (ref 8.6–10.2)
Chloride: 98 mmol/L (ref 96–106)
Creatinine, Ser: 0.91 mg/dL (ref 0.76–1.27)
Globulin, Total: 2.5 g/dL (ref 1.5–4.5)
Glucose: 175 mg/dL — ABNORMAL HIGH (ref 65–99)
Potassium: 4.9 mmol/L (ref 3.5–5.2)
Sodium: 137 mmol/L (ref 134–144)
Total Protein: 7.1 g/dL (ref 6.0–8.5)
eGFR: 88 mL/min/{1.73_m2} (ref >59)

## 2021-07-19 LAB — LIPID PANEL
Chol/HDL Ratio: 3.6 ratio (ref 0.0–5.0)
Cholesterol, Total: 122 mg/dL (ref 100–199)
HDL: 34 mg/dL — ABNORMAL LOW (ref >39)
LDL Chol Calc (NIH): 51 mg/dL (ref 0–99)
Triglycerides: 231 mg/dL — ABNORMAL HIGH (ref 0–149)
VLDL Cholesterol Cal: 37 mg/dL (ref 5–40)

## 2021-07-19 LAB — CBC
Hematocrit: 46.5 % (ref 37.5–51.0)
Hemoglobin: 15.7 g/dL (ref 13.0–17.7)
MCH: 30.8 pg (ref 26.6–33.0)
MCHC: 33.8 g/dL (ref 31.5–35.7)
MCV: 91 fL (ref 79–97)
Platelets: 192 10*3/uL (ref 150–450)
RBC: 5.09 x10E6/uL (ref 4.14–5.80)
RDW: 12.7 % (ref 11.6–15.4)
WBC: 6.3 10*3/uL (ref 3.4–10.8)

## 2021-07-19 LAB — VITAMIN B12: Vitamin B-12: 289 pg/mL (ref 232–1245)

## 2021-07-19 LAB — HEMOGLOBIN A1C
Est. average glucose Bld gHb Est-mCnc: 209 mg/dL
Hgb A1c MFr Bld: 8.9 % — ABNORMAL HIGH (ref 4.8–5.6)

## 2021-07-19 LAB — VITAMIN D 25 HYDROXY (VIT D DEFICIENCY, FRACTURES): Vit D, 25-Hydroxy: 42.3 ng/mL (ref 30.0–100.0)

## 2021-07-19 LAB — PSA TOTAL (REFLEX TO FREE): Prostate Specific Ag, Serum: 1.4 ng/mL (ref 0.0–4.0)

## 2021-07-19 LAB — TSH: TSH: 5.4 u[IU]/mL — ABNORMAL HIGH (ref 0.450–4.500)

## 2021-07-20 ENCOUNTER — Telehealth: Payer: Self-pay

## 2021-07-20 NOTE — Telephone Encounter (Signed)
This encounter was created in error - please disregard.

## 2021-07-20 NOTE — Telephone Encounter (Signed)
Patient called, left VM to return the call to the office for lab results.     Birdie Sons, MD  07/19/2021  9:17 AM EDT     A1c is up to 8.9 Cholesterol is good at 122. Is slightly hyPOthyroid, but not enough to require medications. Will just need to check thyroid tests periodically.  He needs to continue current medications but need to be more strict with diet to get the A1c down.

## 2021-08-29 ENCOUNTER — Telehealth: Payer: Self-pay

## 2021-08-29 NOTE — Progress Notes (Signed)
Chronic Care Management Pharmacy Assistant   Name: JAIKOB BORGWARDT  MRN: [id number] DOB: 1945-12-15  Reason for Encounter: Diabetes Disease State Call/Reschedule appointment with CPP   Recent office visits:  07/18/2021 Lelon Huh, MD (PCP Office Visit) for DM- Changed Neomycin Polymyxin patient taking differently he takes 4 drops into the L ear four times daily prn; labs ordered   Recent consult visits:  07/13/2021 [place], Vena Austria, BCACP [facility]) for Type II DM- No medication changes noted  Hospital visits:  None in previous 6 months  Medications: Outpatient Encounter Medications as of 08/29/2021  Medication Sig   acyclovir (ZOVIRAX) 400 MG tablet 1/2 tablet up to 5 times a day for cold sores   albuterol (VENTOLIN HFA) 108 (90 Base) MCG/ACT inhaler Inhale 2 puffs into the lungs every 6 (six) hours as needed for wheezing or shortness of breath.   atorvastatin (LIPITOR) 20 MG tablet Take 1 tablet (20 mg total) by mouth every evening. (Patient taking differently: Take 40 mg by mouth every evening.)   benzocaine (ORAJEL) 10 % mucosal gel Use as directed 1 application in the mouth or throat as needed for mouth pain.   Cholecalciferol (VITAMIN D PO) Take 2,000 Units by mouth daily.    Docosanol 10 % CREA Apply topically up to 5 times per day as needed until heals   empagliflozin (JARDIANCE) 25 MG TABS tablet Take 25 mg by mouth daily.   Flaxseed, Linseed, (FLAXSEED OIL) 1000 MG CAPS Take 1,000 mg by mouth 2 (two) times daily.   fluticasone (FLONASE) 50 MCG/ACT nasal spray Place 2 sprays into both nostrils daily. (Patient taking differently: Place 2 sprays into both nostrils 2 (two) times daily.)   gabapentin (NEURONTIN) 100 MG capsule Take 1 capsule (100 mg total) by mouth 2 (two) times daily.   insulin detemir (LEVEMIR) 100 UNIT/ML injection Inject 0.3 mLs (30 Units total) into the skin 2 (two) times daily. (Patient taking differently: Inject 60 Units into the  skin 2 (two) times daily.)   levETIRAcetam (KEPPRA) 750 MG tablet Take 2 tablets (1,500 mg total) by mouth 2 (two) times daily. 3 in the am and 3 at bedtime (Patient taking differently: Take 1,500 mg by mouth 2 (two) times daily.)   lisinopril (ZESTRIL) 10 MG tablet Take 5 mg by mouth daily.    loratadine (CLARITIN) 10 MG tablet Take 10 mg by mouth daily.    metFORMIN (GLUCOPHAGE) 850 MG tablet Take 1 tablet (850 mg total) by mouth 2 (two) times daily with a meal.   Misc Natural Products (OSTEO BI-FLEX TRIPLE STRENGTH PO) Take 1 tablet by mouth 2 (two) times daily.    MISC NATURAL PRODUCTS PO Take 1 capsule by mouth in the morning and at bedtime. Super Beta Prostate   montelukast (SINGULAIR) 10 MG tablet TAKE ONE TABLET EVERY DAY FOR CHRONIC COUGH   Omega-3 Fatty Acids (FISH OIL) 1000 MG CPDR Take 1,200 mg by mouth 2 (two) times daily.    PARoxetine (PAXIL) 40 MG tablet Take 1 tablet (40 mg total) by mouth daily.   rivaroxaban (XARELTO) 20 MG TABS tablet Take 1 tablet (20 mg total) by mouth daily with supper.   tamsulosin (FLOMAX) 0.4 MG CAPS capsule Take 1 capsule by mouth daily.   zolpidem (AMBIEN) 10 MG tablet TAKE 1/2 TO 1 TABLET AT BEDTIME   No facility-administered encounter medications on file as of 08/29/2021.   Care Gaps: Zoster Vaccines Diabetic Foot Exam COVID-19 Booster 4 Fecal  DNA Cologuard (last completed 02/20/2018) Influenza Vaccine (last completed 10/11/2020)  Star Rating Drugs: Lisinopril 10 mg Atorvastatin 20 mg Jardiance 25 mg Metformin 850 mg   Patient appears to be getting majority of his medications from the [place]  Recent Relevant Labs: Lab Results  Component Value Date/Time   HGBA1C 8.9 (H) 07/18/2021 09:35 AM   HGBA1C 7.6 (H) 05/16/2020 10:33 AM   HGBA1C 7.7 02/07/2020 12:00 AM   HGBA1C 7.2 11/06/2017 12:00 AM   MICROALBUR 15 02/07/2020 12:00 AM   MICROALBUR 20 02/02/2018 08:46 AM    Kidney Function Lab Results  Component Value Date/Time   CREATININE  0.91 07/18/2021 09:35 AM   CREATININE 0.8 02/07/2020 12:00 AM   CREATININE 0.70 09/29/2017 10:04 PM   CREATININE 1.07 04/18/2012 04:12 AM   CREATININE 1.20 04/02/2012 01:47 PM   GFRNONAA 100.4 02/07/2020 12:00 AM   GFRNONAA >60 04/18/2012 04:12 AM   GFRAA >60 09/23/2017 07:53 PM   GFRAA 118.2 03/03/2017 10:19 AM    Current antihyperglycemic regimen:  Jardiance 25 mg daily Metformin 850 mg 1 tablet twice daily Levemir 100 unit inject 0.3 units into skin twice daily  What recent interventions/DTPs have been made to improve glycemic control:  None ID   Have there been any recent hospitalizations or ED visits since last visit with CPP? No  Are you checking your feet daily/regularly?   Adherence Review: Is the patient currently on a STATIN medication? Yes Is the patient currently on ACE/ARB medication? Yes Does the patient have >5 day gap between last estimated fill dates? No  -Breakfast: [place] sausage, bowl of cereal w/ bananas and english muffin (1-2);  occ eggs, prune juice  -Lunch: sandwich ([place] or banana), potato chips or something salty -Dinner: TV dinners, eats out 2-3x/week (steakhouse, Firef[employer]); chicken or  fish (usually baked/grilled; vegetables; salads; pizza)  -Snacks: sugarfree candy, sugarfree cookies, fruit -Drinks: diet soda, diet tea  09/22 LVM requesting a return call 09/27 LVM requesting a return call 10/03 LVM requesting a return call  I have attempted without success to contact this patient by phone three times to complete his monthly disease state call, and to get him scheduled with Junius Argyle, CPP. I have left voice messages requesting this patient to return my calls.  Lynann Bologna, CPA/CMA Clinical Pharmacist Assistant Phone: [phone]

## 2021-09-09 ENCOUNTER — Other Ambulatory Visit: Payer: Self-pay | Admitting: Family Medicine

## 2021-09-09 DIAGNOSIS — G4700 Insomnia, unspecified: Secondary | ICD-10-CM

## 2021-09-09 DIAGNOSIS — F439 Reaction to severe stress, unspecified: Secondary | ICD-10-CM

## 2021-09-09 MED ORDER — ZOLPIDEM TARTRATE 10 MG PO TABS: ORAL_TABLET | ORAL | 1 refills | Status: DC

## 2021-09-09 MED ORDER — PAROXETINE HCL 40 MG PO TABS: 40.0000 mg | ORAL_TABLET | Freq: Every day | ORAL | 4 refills | Status: DC

## 2021-10-17 ENCOUNTER — Other Ambulatory Visit: Payer: Self-pay

## 2021-10-17 ENCOUNTER — Ambulatory Visit (INDEPENDENT_AMBULATORY_CARE_PROVIDER_SITE_OTHER): Payer: Medicare HMO

## 2021-10-17 DIAGNOSIS — Z23 Encounter for immunization: Secondary | ICD-10-CM

## 2021-12-27 ENCOUNTER — Telehealth: Payer: Self-pay

## 2021-12-27 NOTE — Progress Notes (Signed)
Chronic Care Management Pharmacy Assistant   Name: Sean Hayden  MRN: [id number] DOB: 01-04-1946  Reason for Encounter: Diabetes Disease State Call   Recent office visits:  None ID  Recent consult visits:  None ID  Hospital visits:  None in previous 6 months  Medications: Outpatient Encounter Medications as of 12/27/2021  Medication Sig   acyclovir (ZOVIRAX) 400 MG tablet 1/2 tablet up to 5 times a day for cold sores   albuterol (VENTOLIN HFA) 108 (90 Base) MCG/ACT inhaler Inhale 2 puffs into the lungs every 6 (six) hours as needed for wheezing or shortness of breath.   atorvastatin (LIPITOR) 20 MG tablet Take 1 tablet (20 mg total) by mouth every evening. (Patient taking differently: Take 40 mg by mouth every evening.)   benzocaine (ORAJEL) 10 % mucosal gel Use as directed 1 application in the mouth or throat as needed for mouth pain.   Cholecalciferol (VITAMIN D PO) Take 2,000 Units by mouth daily.    Docosanol 10 % CREA Apply topically up to 5 times per day as needed until heals   empagliflozin (JARDIANCE) 25 MG TABS tablet Take 25 mg by mouth daily.   Flaxseed, Linseed, (FLAXSEED OIL) 1000 MG CAPS Take 1,000 mg by mouth 2 (two) times daily.   fluticasone (FLONASE) 50 MCG/ACT nasal spray Place 2 sprays into both nostrils daily. (Patient taking differently: Place 2 sprays into both nostrils 2 (two) times daily.)   gabapentin (NEURONTIN) 100 MG capsule Take 1 capsule (100 mg total) by mouth 2 (two) times daily.   insulin detemir (LEVEMIR) 100 UNIT/ML injection Inject 0.3 mLs (30 Units total) into the skin 2 (two) times daily. (Patient taking differently: Inject 60 Units into the skin 2 (two) times daily.)   levETIRAcetam (KEPPRA) 750 MG tablet Take 2 tablets (1,500 mg total) by mouth 2 (two) times daily. 3 in the am and 3 at bedtime (Patient taking differently: Take 1,500 mg by mouth 2 (two) times daily.)   lisinopril (ZESTRIL) 10 MG tablet Take 5 mg by mouth daily.     loratadine (CLARITIN) 10 MG tablet Take 10 mg by mouth daily.    metFORMIN (GLUCOPHAGE) 850 MG tablet Take 1 tablet (850 mg total) by mouth 2 (two) times daily with a meal.   Misc Natural Products (OSTEO BI-FLEX TRIPLE STRENGTH PO) Take 1 tablet by mouth 2 (two) times daily.    MISC NATURAL PRODUCTS PO Take 1 capsule by mouth in the morning and at bedtime. Super Beta Prostate   montelukast (SINGULAIR) 10 MG tablet TAKE ONE TABLET EVERY DAY FOR CHRONIC COUGH   Omega-3 Fatty Acids (FISH OIL) 1000 MG CPDR Take 1,200 mg by mouth 2 (two) times daily.    PARoxetine (PAXIL) 40 MG tablet Take 1 tablet (40 mg total) by mouth daily.   rivaroxaban (XARELTO) 20 MG TABS tablet Take 1 tablet (20 mg total) by mouth daily with supper.   tamsulosin (FLOMAX) 0.4 MG CAPS capsule Take 1 capsule by mouth daily.   zolpidem (AMBIEN) 10 MG tablet TAKE 1/2 TO 1 TABLET AT BEDTIME   No facility-administered encounter medications on file as of 12/27/2021.   Care Gaps: Diabetic Foot Exam COVID-19 Vaccine Zoster Vaccine  Star Rating Drugs: Lisinopril 10 mg Atorvastatin 20 mg Jardiance 25 mg  No recent refills on patient's medications, but he does go to the [place] so possible he is getting all his medications there.   Patient advised that he would like to unenroll from CCM services  as he is getting the same service from the [place].  CCM will be notified   Lynann Bologna, CPA/CMA Clinical Pharmacist Assistant Phone: [phone]

## 2022-01-03 ENCOUNTER — Other Ambulatory Visit: Payer: Self-pay

## 2022-01-03 ENCOUNTER — Ambulatory Visit (INDEPENDENT_AMBULATORY_CARE_PROVIDER_SITE_OTHER): Payer: Medicare HMO

## 2022-01-03 VITALS — BP 142/70 | HR 70 | Temp 98.2°F | Ht 65.0 in | Wt 245.7 lb

## 2022-01-03 DIAGNOSIS — Z0000 Encounter for general adult medical examination without abnormal findings: Secondary | ICD-10-CM

## 2022-01-03 NOTE — Progress Notes (Signed)
Subjective:   Sean Hayden is a 76 y.o. male who presents for Medicare Annual/Subsequent preventive examination.  Review of Systems           Objective:    Today's Vitals   01/03/22 1034  BP: (!) 142/70  Pulse: 70  Temp: 98.2 F (36.8 C)  TempSrc: Oral  SpO2: 93%  Weight: 245 lb 11.2 oz (111.4 kg)  Height: 5\' 5"  (1.651 m)   Body mass index is 40.89 kg/m.  Advanced Directives 12/28/2020 12/27/2019 12/23/2018 12/04/2017 09/29/2017 09/23/2017 09/15/2017  Does Patient Have a Medical Advance Directive? Yes Yes Yes Yes No Yes Yes  Type of Paramedic of [place];Living will [place];Living will [place];Living will [place];Living will - [place] -  Does patient want to make changes to medical advance directive? - - - - - - Yes (ED - Information included in AVS)  Copy of [place] in Chart? No - copy requested No - copy requested No - copy requested No - copy requested - - -    Current Medications (verified) Outpatient Encounter Medications as of 01/03/2022  Medication Sig   acyclovir (ZOVIRAX) 400 MG tablet 1/2 tablet up to 5 times a day for cold sores   albuterol (VENTOLIN HFA) 108 (90 Base) MCG/ACT inhaler Inhale 2 puffs into the lungs every 6 (six) hours as needed for wheezing or shortness of breath.   atorvastatin (LIPITOR) 20 MG tablet Take 1 tablet (20 mg total) by mouth every evening. (Patient taking differently: Take 40 mg by mouth every evening.)   benzocaine (ORAJEL) 10 % mucosal gel Use as directed 1 application in the mouth or throat as needed for mouth pain.   Cholecalciferol (VITAMIN D PO) Take 2,000 Units by mouth daily.    Docosanol 10 % CREA Apply topically up to 5 times per day as needed until heals   empagliflozin (JARDIANCE) 25 MG TABS tablet Take 25 mg by mouth daily.   Flaxseed, Linseed, (FLAXSEED OIL) 1000 MG CAPS Take 1,000 mg by mouth  2 (two) times daily.   fluticasone (FLONASE) 50 MCG/ACT nasal spray Place 2 sprays into both nostrils daily. (Patient taking differently: Place 2 sprays into both nostrils 2 (two) times daily.)   gabapentin (NEURONTIN) 100 MG capsule Take 1 capsule (100 mg total) by mouth 2 (two) times daily.   insulin detemir (LEVEMIR) 100 UNIT/ML injection Inject 0.3 mLs (30 Units total) into the skin 2 (two) times daily. (Patient taking differently: Inject 60 Units into the skin 2 (two) times daily.)   levETIRAcetam (KEPPRA) 750 MG tablet Take 2 tablets (1,500 mg total) by mouth 2 (two) times daily. 3 in the am and 3 at bedtime (Patient taking differently: Take 1,500 mg by mouth 2 (two) times daily.)   lisinopril (ZESTRIL) 10 MG tablet Take 5 mg by mouth daily.    loratadine (CLARITIN) 10 MG tablet Take 10 mg by mouth daily.    metFORMIN (GLUCOPHAGE) 850 MG tablet Take 1 tablet (850 mg total) by mouth 2 (two) times daily with a meal.   Misc Natural Products (OSTEO BI-FLEX TRIPLE STRENGTH PO) Take 1 tablet by mouth 2 (two) times daily.    MISC NATURAL PRODUCTS PO Take 1 capsule by mouth in the morning and at bedtime. Super Beta Prostate   montelukast (SINGULAIR) 10 MG tablet TAKE ONE TABLET EVERY DAY FOR CHRONIC COUGH   Omega-3 Fatty Acids (FISH OIL) 1000  MG CPDR Take 1,200 mg by mouth 2 (two) times daily.    PARoxetine (PAXIL) 40 MG tablet Take 1 tablet (40 mg total) by mouth daily.   rivaroxaban (XARELTO) 20 MG TABS tablet Take 1 tablet (20 mg total) by mouth daily with supper.   tamsulosin (FLOMAX) 0.4 MG CAPS capsule Take 1 capsule by mouth daily.   zolpidem (AMBIEN) 10 MG tablet TAKE 1/2 TO 1 TABLET AT BEDTIME   No facility-administered encounter medications on file as of 01/03/2022.    Allergies (verified) Actos [pioglitazone]   History: Past Medical History:  Diagnosis Date   Aortic stenosis    moderate by 02/2016 echo   B12 deficiency    Cancer ([place])    skin cancer - face    CVA (cerebral  vascular accident) ([place])    02/2016 no residual   Diabetes mellitus without complication ([place])    Gout    Hemorrhoids    History of kidney stones    Hyperlipidemia    Hyperplastic colon polyp    Hypertension    MRSA infection    Neuropathy    PAF (paroxysmal atrial fibrillation) ([place])    Personal history of urinary calculi 05/25/2015   Pneumonia    1990's   Seizures ([place])    Past Surgical History:  Procedure Laterality Date   Arm fracture Left    CAROTID ARTERY ANGIOPLASTY Right 04/17/2012   Dr. Delana Meyer, [place] Right 04/17/2012   Dr. Delana Meyer, [place]  2005   lap   COLONOSCOPY     CYST EXCISION     "between legs"   EP IMPLANTABLE DEVICE N/A 02/23/2016   Procedure: Loop Recorder Insertion;  Surgeon: Thompson Grayer, MD;  Location: [facility];  Service: Cardiovascular;  Laterality: N/A;   HEMORRHOID SURGERY  2009   INCISION AND DRAINAGE WOUND WITH NERVE AND ARTERY REPAIR Left 10/04/2017   Procedure: LEFT HAND WOUND EXPLORATION repair as necessary;  Surgeon: Iran Planas, MD;  Location: [facility];  Service: Orthopedics;  Laterality: Left;   TEE WITHOUT CARDIOVERSION N/A 02/23/2016   Procedure: TRANSESOPHAGEAL ECHOCARDIOGRAM (TEE);  Surgeon: Pixie Casino, MD;  Location: [facility] ENDOSCOPY;  Service: Cardiovascular;  Laterality: N/A;  ALSO GETTING A LOOP   Family History  Problem Relation Age of Onset   Hodgkin's lymphoma Father    Diabetes Mother    Social History   Socioeconomic History   Marital status: Married    Spouse name: Not on file   Number of children: 2   Years of education: HS Grad   Highest education level: 12th grade  Occupational History   Occupation: Retired  Tobacco Use   Smoking status: Former    Types: Cigars    Quit date: 04/08/2012    Years since quitting: 9.7   Smokeless tobacco: Never   Tobacco comments:    cigarettes and cigars  Vaping Use   Vaping Use: Never used  Substance and Sexual Activity   Alcohol  use: No    Alcohol/week: 0.0 standard drinks    Comment: quit 2013   Drug use: No   Sexual activity: Not on file  Other Topics Concern   Not on file  Social History Narrative   Lives at home with his granddaughter.   Right-handed.   No caffeine use.       Social Determinants of Health   Financial Resource Strain: Low Risk    Difficulty of Paying Living Expenses: Not hard at all  Food Insecurity: Not on file  Transportation Needs: No Transportation Needs   Lack of Transportation (Medical): No   Lack of Transportation (Non-Medical): No  Physical Activity: Not on file  Stress: Not on file  Social Connections: Not on file    Tobacco Counseling Counseling given: Not Answered Tobacco comments: cigarettes and cigars   Clinical Intake:  Pre-visit preparation completed: Yes  Pain : No/denies pain     Nutritional Risks: None Diabetes: Yes CBG done?: No Did pt. bring in CBG monitor from home?: No  How often do you need to have someone help you when you read instructions, pamphlets, or other written materials from your doctor or pharmacy?: 1 - Never  Diabetic?yes Nutrition Risk Assessment:  Has the patient had any N/V/D within the last 2 months?  No  Does the patient have any non-healing wounds?  No  Has the patient had any unintentional weight loss or weight gain?  No   Diabetes:  Is the patient diabetic?  Yes  If diabetic, was a CBG obtained today?  No  Did the patient bring in their glucometer from home?  No  How often do you monitor your CBG's? Every few days.   Financial Strains and Diabetes Management:  Are you having any financial strains with the device, your supplies or your medication? No .  Does the patient want to be seen by Chronic Care Management for management of their diabetes?  No  Would the patient like to be referred to a Nutritionist or for Diabetic Management?  No   Diabetic Exams:  Diabetic Eye Exam: Completed 02/14/21 (negative  retinopathy).   Diabetic Foot Exam: Completed 09/08/16. Pt has been advised about the importance in completing this exam.   Interpreter Needed?: No  Information entered by :: Kirke Shaggy, LPN   Activities of Daily Living In your present state of health, do you have any difficulty performing the following activities: 07/18/2021  Hearing? N  Vision? N  Difficulty concentrating or making decisions? N  Walking or climbing stairs? Y  Dressing or bathing? Y  Doing errands, shopping? Y  Some recent data might be hidden    Patient Care Team: Birdie Sons, MD as PCP - General (Family Medicine) Marcial Pacas, MD as Consulting Physician (Neurology) [facility], [facility], [place] as Referring Physician ([place]) Germaine Pomfret, [facility] as Pharmacist (Pharmacist) Eulogio Bear, MD as Consulting Physician (Ophthalmology) Thompson Grayer, MD as Consulting Physician (Cardiology)  Indicate any recent Medical Services you may have received from other than [facility] providers in the past year (date may be approximate).     Assessment:   This is a routine wellness examination for J[place]el.  Hearing/Vision screen No results found.  Dietary issues and exercise activities discussed:     Goals Addressed   None    Depression Screen PHQ 2/9 Scores 07/18/2021 12/28/2020 12/27/2019 12/27/2019 05/12/2019 12/23/2018 12/04/2017  PHQ - 2 Score 3 0 0 0 2 2 5   PHQ- 9 Score 12 - - - 8 6 15     Fall Risk Fall Risk  07/18/2021 12/28/2020 12/27/2019 05/12/2019 12/23/2018  Falls in the past year? 1 1 1  0 0  Number falls in past yr: 1 1 0 - -  Injury with Fall? 1 0 0 - -  Comment - - - - -  Risk for fall due to : History of fall(s) - - - -  Follow up Falls prevention discussed Falls prevention discussed Falls prevention  discussed - -    FALL RISK PREVENTION PERTAINING TO THE HOME:  Any stairs in or around the home? Yes  If so, are there any without handrails? No  Home  free of loose throw rugs in walkways, pet beds, electrical cords, etc? Yes  Adequate lighting in your home to reduce risk of falls? Yes   ASSISTIVE DEVICES UTILIZED TO PREVENT FALLS:  Life alert? No  Use of a cane, walker or w/c? No  Grab bars in the bathroom? Yes  Shower chair or bench in shower? No  Elevated toilet seat or a handicapped toilet? No   TIMED UP AND GO:  Was the test performed? Yes .  Length of time to ambulate 10 feet: 6 sec.   Gait slow and steady without use of assistive device  Cognitive Function:Normal cognitive status assessed by direct observation by this Nurse Health Advisor. No abnormalities found.       6CIT Screen 12/06/2016  What Year? 4 points  What month? 0 points  What time? 0 points  Count back from 20 0 points  Months in reverse 0 points  Repeat phrase 2 points  Total Score 6    Immunizations Immunization History  Administered Date(s) Administered   Fluad Quad(high Dose 65+) 09/28/2019, 10/11/2020, 10/17/2021   Influenza, High Dose Seasonal PF 08/25/2015, 08/01/2017, 08/19/2018   Influenza, Seasonal, Injecte, Preservative Fre 09/23/2014   Influenza-Unspecified 08/09/2013, 09/23/2014, 09/20/2015, 08/08/2017, 08/10/2019   PFIZER(Purple Top)SARS-COV-2 Vaccination 02/08/2020, 02/29/2020, 10/03/2020   Pneumococcal Conjugate-13 10/17/2014, 11/01/2014   Pneumococcal Polysaccharide-23 11/15/2008, 04/27/2012, 10/08/2013   Pneumococcal-Unspecified 07/09/2017   Tdap 04/17/2010, 05/09/2013, 07/23/2013, 09/15/2017   Zoster Recombinat (Shingrix) 08/20/2021   Zoster, Live 09/23/2014    TDAP status: Up to date  Flu Vaccine status: Up to date  Pneumococcal vaccine status: Up to date  Covid-19 vaccine status: Completed vaccines  Qualifies for Shingles Vaccine? Yes   Zostavax completed Yes   Shingrix Completed?: Yes  Screening Tests Health Maintenance  Topic Date Due   FOOT EXAM  09/08/2017   COVID-19 Vaccine (4 - Booster for Pfizer series)  11/28/2020   Zoster Vaccines- Shingrix (2 of 2) 10/15/2021   HEMOGLOBIN A1C  01/18/2022   OPHTHALMOLOGY EXAM  02/14/2022   TETANUS/TDAP  09/16/2027   Pneumonia Vaccine 9+ Years old  Completed   INFLUENZA VACCINE  Completed   Hepatitis C Screening  Completed   HPV VACCINES  Aged Out   Fecal DNA (Cologuard)  Discontinued    Health Maintenance  Health Maintenance Due  Topic Date Due   FOOT EXAM  09/08/2017   COVID-19 Vaccine (4 - Booster for Pfizer series) 11/28/2020   Zoster Vaccines- Shingrix (2 of 2) 10/15/2021    Colorectal cancer screening: No longer required.   Lung Cancer Screening: (Low Dose CT Chest recommended if Age 71-80 years, 30 pack-year currently smoking OR have quit w/in 15years.) does not qualify.    Additional Screening:  Hepatitis C Screening: does qualify; Completed 02/21/16  Vision Screening: Recommended annual ophthalmology exams for early detection of glaucoma and other disorders of the eye. Is the patient up to date with their annual eye exam?  Yes  Who is the provider or what is the name of the office in which the patient attends annual eye exams? [facility] If pt is not established with a provider, would they like to be referred to a provider to establish care? No .   Dental Screening: Recommended annual dental exams for proper oral hygiene  Community  Resource Referral / Chronic Care Management: CRR required this visit?  No   CCM required this visit?  No      Plan:     I have personally reviewed and noted the following in the patients chart:   Medical and social history Use of alcohol, tobacco or illicit drugs  Current medications and supplements including opioid prescriptions. Patient is not currently taking opioid prescriptions. Functional ability and status Nutritional status Physical activity Advanced directives List of other physicians Hospitalizations, surgeries, and ER visits in previous 12 months Vitals Screenings to  include cognitive, depression, and falls Referrals and appointments  In addition, I have reviewed and discussed with patient certain preventive protocols, quality metrics, and best practice recommendations. A written personalized care plan for preventive services as well as general preventive health recommendations were provided to patient.     Dionisio David, LPN   06/27/9197   Nurse Notes: none

## 2022-01-03 NOTE — Patient Instructions (Signed)
Sean Hayden , Thank you for taking time to come for your Medicare Wellness Visit. I appreciate your ongoing commitment to your health goals. Please review the following plan we discussed and let me know if I can assist you in the future.   Screening recommendations/referrals: Colonoscopy: aged out Recommended yearly ophthalmology/optometry visit for glaucoma screening and checkup Recommended yearly dental visit for hygiene and checkup  Vaccinations: Influenza vaccine: 10/17/21 Pneumococcal vaccine: 07/09/17 Tdap vaccine: 09/15/17 Shingles vaccine: shingrix   08/20/21 zostavax 09/23/14 Covid-19: 02/08/20, 02/29/20, 10/03/20  Advanced directives: no  Conditions/risks identified: none  Next appointment: Follow up in one year for your annual wellness visit. 01/06/23 @ 10:20am in person  Preventive Care 65 Years and Older, Male Preventive care refers to lifestyle choices and visits with your health care provider that can promote health and wellness. What does preventive care include? A yearly physical exam. This is also called an annual well check. Dental exams once or twice a year. Routine eye exams. Ask your health care provider how often you should have your eyes checked. Personal lifestyle choices, including: Daily care of your teeth and gums. Regular physical activity. Eating a healthy diet. Avoiding tobacco and drug use. Limiting alcohol use. Practicing safe sex. Taking low doses of aspirin every day. Taking vitamin and mineral supplements as recommended by your health care provider. What happens during an annual well check? The services and screenings done by your health care provider during your annual well check will depend on your age, overall health, lifestyle risk factors, and family history of disease. Counseling  Your health care provider may ask you questions about your: Alcohol use. Tobacco use. Drug use. Emotional well-being. Home and relationship well-being. Sexual  activity. Eating habits. History of falls. Memory and ability to understand (cognition). Work and work Statistician. Screening  You may have the following tests or measurements: Height, weight, and BMI. Blood pressure. Lipid and cholesterol levels. These may be checked every 5 years, or more frequently if you are over 35 years old. Skin check. Lung cancer screening. You may have this screening every year starting at age 65 if you have a 30-pack-year history of smoking and currently smoke or have quit within the past 15 years. Fecal occult blood test (FOBT) of the stool. You may have this test every year starting at age 33. Flexible sigmoidoscopy or colonoscopy. You may have a sigmoidoscopy every 5 years or a colonoscopy every 10 years starting at age 84. Prostate cancer screening. Recommendations will vary depending on your family history and other risks. Hepatitis C blood test. Hepatitis B blood test. Sexually transmitted disease (STD) testing. Diabetes screening. This is done by checking your blood sugar (glucose) after you have not eaten for a while (fasting). You may have this done every 1-3 years. Abdominal aortic aneurysm (AAA) screening. You may need this if you are a current or former smoker. Osteoporosis. You may be screened starting at age 33 if you are at high risk. Talk with your health care provider about your test results, treatment options, and if necessary, the need for more tests. Vaccines  Your health care provider may recommend certain vaccines, such as: Influenza vaccine. This is recommended every year. Tetanus, diphtheria, and acellular pertussis (Tdap, Td) vaccine. You may need a Td booster every 10 years. Zoster vaccine. You may need this after age 30. Pneumococcal 13-valent conjugate (PCV13) vaccine. One dose is recommended after age 67. Pneumococcal polysaccharide (PPSV23) vaccine. One dose is recommended after age 54. Talk to  your health care provider about which  screenings and vaccines you need and how often you need them. This information is not intended to replace advice given to you by your health care provider. Make sure you discuss any questions you have with your health care provider. Document Released: 12/22/2015 Document Revised: 08/14/2016 Document Reviewed: 09/26/2015 Elsevier Interactive Patient Education  2017 [place] Prevention in the Home Falls can cause injuries. They can happen to people of all ages. There are many things you can do to make your home safe and to help prevent falls. What can I do on the outside of my home? Regularly fix the edges of walkways and driveways and fix any cracks. Remove anything that might make you trip as you walk through a door, such as a raised step or threshold. Trim any bushes or trees on the path to your home. Use bright outdoor lighting. Clear any walking paths of anything that might make someone trip, such as rocks or tools. Regularly check to see if handrails are loose or broken. Make sure that both sides of any steps have handrails. Any raised decks and porches should have guardrails on the edges. Have any leaves, snow, or ice cleared regularly. Use sand or salt on walking paths during winter. Clean up any spills in your garage right away. This includes oil or grease spills. What can I do in the bathroom? Use night lights. Install grab bars by the toilet and in the tub and shower. Do not use towel bars as grab bars. Use non-skid mats or decals in the tub or shower. If you need to sit down in the shower, use a plastic, non-slip stool. Keep the floor dry. Clean up any water that spills on the floor as soon as it happens. Remove soap buildup in the tub or shower regularly. Attach bath mats securely with double-sided non-slip rug tape. Do not have throw rugs and other things on the floor that can make you trip. What can I do in the bedroom? Use night lights. Make sure that you have a  light by your bed that is easy to reach. Do not use any sheets or blankets that are too big for your bed. They should not hang down onto the floor. Have a firm chair that has side arms. You can use this for support while you get dressed. Do not have throw rugs and other things on the floor that can make you trip. What can I do in the kitchen? Clean up any spills right away. Avoid walking on wet floors. Keep items that you use a lot in easy-to-reach places. If you need to reach something above you, use a strong step stool that has a grab bar. Keep electrical cords out of the way. Do not use floor polish or wax that makes floors slippery. If you must use wax, use non-skid floor wax. Do not have throw rugs and other things on the floor that can make you trip. What can I do with my stairs? Do not leave any items on the stairs. Make sure that there are handrails on both sides of the stairs and use them. Fix handrails that are broken or loose. Make sure that handrails are as long as the stairways. Check any carpeting to make sure that it is firmly attached to the stairs. Fix any carpet that is loose or worn. Avoid having throw rugs at the top or bottom of the stairs. If you do have throw rugs, attach  them to the floor with carpet tape. Make sure that you have a light switch at the top of the stairs and the bottom of the stairs. If you do not have them, ask someone to add them for you. What else can I do to help prevent falls? Wear shoes that: Do not have high heels. Have rubber bottoms. Are comfortable and fit you well. Are closed at the toe. Do not wear sandals. If you use a stepladder: Make sure that it is fully opened. Do not climb a closed stepladder. Make sure that both sides of the stepladder are locked into place. Ask someone to hold it for you, if possible. Clearly mark and make sure that you can see: Any grab bars or handrails. First and last steps. Where the edge of each step  is. Use tools that help you move around (mobility aids) if they are needed. These include: Canes. Walkers. Scooters. Crutches. Turn on the lights when you go into a dark area. Replace any light bulbs as soon as they burn out. Set up your furniture so you have a clear path. Avoid moving your furniture around. If any of your floors are uneven, fix them. If there are any pets around you, be aware of where they are. Review your medicines with your doctor. Some medicines can make you feel dizzy. This can increase your chance of falling. Ask your doctor what other things that you can do to help prevent falls. This information is not intended to replace advice given to you by your health care provider. Make sure you discuss any questions you have with your health care provider. Document Released: 09/21/2009 Document Revised: 05/02/2016 Document Reviewed: 12/30/2014 Elsevier Interactive Patient Education  2017 [employer].
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
## 2022-01-25 ENCOUNTER — Ambulatory Visit (INDEPENDENT_AMBULATORY_CARE_PROVIDER_SITE_OTHER): Payer: Medicare HMO | Admitting: Family Medicine

## 2022-01-25 ENCOUNTER — Encounter: Payer: Self-pay | Admitting: Family Medicine

## 2022-01-25 ENCOUNTER — Other Ambulatory Visit: Payer: Self-pay

## 2022-01-25 VITALS — BP 113/56 | HR 86 | Temp 97.9°F | Resp 16 | Ht 67.0 in | Wt 245.0 lb

## 2022-01-25 DIAGNOSIS — R7989 Other specified abnormal findings of blood chemistry: Secondary | ICD-10-CM

## 2022-01-25 DIAGNOSIS — Z0000 Encounter for general adult medical examination without abnormal findings: Secondary | ICD-10-CM

## 2022-01-25 DIAGNOSIS — E538 Deficiency of other specified B group vitamins: Secondary | ICD-10-CM | POA: Diagnosis not present

## 2022-01-25 DIAGNOSIS — I10 Essential (primary) hypertension: Secondary | ICD-10-CM

## 2022-01-25 DIAGNOSIS — E559 Vitamin D deficiency, unspecified: Secondary | ICD-10-CM

## 2022-01-25 DIAGNOSIS — Z794 Long term (current) use of insulin: Secondary | ICD-10-CM | POA: Diagnosis not present

## 2022-01-25 DIAGNOSIS — I679 Cerebrovascular disease, unspecified: Secondary | ICD-10-CM

## 2022-01-25 DIAGNOSIS — Q230 Congenital stenosis of aortic valve: Secondary | ICD-10-CM | POA: Diagnosis not present

## 2022-01-25 DIAGNOSIS — E1140 Type 2 diabetes mellitus with diabetic neuropathy, unspecified: Secondary | ICD-10-CM | POA: Diagnosis not present

## 2022-01-26 ENCOUNTER — Encounter: Payer: Self-pay | Admitting: Family Medicine

## 2022-01-26 DIAGNOSIS — R7989 Other specified abnormal findings of blood chemistry: Secondary | ICD-10-CM | POA: Insufficient documentation

## 2022-01-26 LAB — CBC
Hematocrit: 44 % (ref 37.5–51.0)
Hemoglobin: 15 g/dL (ref 13.0–17.7)
MCH: 31.4 pg (ref 26.6–33.0)
MCHC: 34.1 g/dL (ref 31.5–35.7)
MCV: 92 fL (ref 79–97)
Platelets: 161 10*3/uL (ref 150–450)
RBC: 4.78 x10E6/uL (ref 4.14–5.80)
RDW: 12.6 % (ref 11.6–15.4)
WBC: 5.3 10*3/uL (ref 3.4–10.8)

## 2022-01-26 LAB — LIPID PANEL
Chol/HDL Ratio: 3.4 ratio (ref 0.0–5.0)
Cholesterol, Total: 111 mg/dL (ref 100–199)
HDL: 33 mg/dL — ABNORMAL LOW (ref >39)
LDL Chol Calc (NIH): 45 mg/dL (ref 0–99)
Triglycerides: 200 mg/dL — ABNORMAL HIGH (ref 0–149)
VLDL Cholesterol Cal: 33 mg/dL (ref 5–40)

## 2022-01-26 LAB — COMPREHENSIVE METABOLIC PANEL
ALT: 19 IU/L (ref 0–44)
AST: 18 IU/L (ref 0–40)
Albumin/Globulin Ratio: 1.7 (ref 1.2–2.2)
Albumin: 4.2 g/dL (ref 3.7–4.7)
Alkaline Phosphatase: 91 IU/L (ref 44–121)
BUN/Creatinine Ratio: 14 (ref 10–24)
BUN: 13 mg/dL (ref 8–27)
Bilirubin Total: 0.3 mg/dL (ref 0.0–1.2)
CO2: 24 mmol/L (ref 20–29)
Calcium: 9.5 mg/dL (ref 8.6–10.2)
Chloride: 100 mmol/L (ref 96–106)
Creatinine, Ser: 0.91 mg/dL (ref 0.76–1.27)
Globulin, Total: 2.5 g/dL (ref 1.5–4.5)
Glucose: 250 mg/dL — ABNORMAL HIGH (ref 70–99)
Potassium: 4.4 mmol/L (ref 3.5–5.2)
Sodium: 138 mmol/L (ref 134–144)
Total Protein: 6.7 g/dL (ref 6.0–8.5)
eGFR: 87 mL/min/{1.73_m2} (ref >59)

## 2022-01-26 LAB — TSH: TSH: 4.86 u[IU]/mL — ABNORMAL HIGH (ref 0.450–4.500)

## 2022-01-26 LAB — MICROALBUMIN / CREATININE URINE RATIO
Creatinine, Urine: 58 mg/dL
Microalb/Creat Ratio: 6 mg/g creat (ref 0–29)
Microalbumin, Urine: 3.7 ug/mL

## 2022-01-26 LAB — HEMOGLOBIN A1C
Est. average glucose Bld gHb Est-mCnc: 197 mg/dL
Hgb A1c MFr Bld: 8.5 % — ABNORMAL HIGH (ref 4.8–5.6)

## 2022-01-26 LAB — VITAMIN B12: Vitamin B-12: 211 pg/mL — ABNORMAL LOW (ref 232–1245)

## 2022-01-26 LAB — T4, FREE: Free T4: 0.87 ng/dL (ref 0.82–1.77)

## 2022-01-26 LAB — VITAMIN D 25 HYDROXY (VIT D DEFICIENCY, FRACTURES): Vit D, 25-Hydroxy: 40.7 ng/mL (ref 30.0–100.0)

## 2022-03-25 ENCOUNTER — Other Ambulatory Visit: Payer: Self-pay | Admitting: Family Medicine

## 2022-03-25 DIAGNOSIS — C4442 Squamous cell carcinoma of skin of scalp and neck: Secondary | ICD-10-CM | POA: Diagnosis not present

## 2022-03-25 DIAGNOSIS — G4700 Insomnia, unspecified: Secondary | ICD-10-CM

## 2022-04-15 DIAGNOSIS — L739 Follicular disorder, unspecified: Secondary | ICD-10-CM | POA: Diagnosis not present

## 2022-04-15 DIAGNOSIS — Z8614 Personal history of Methicillin resistant Staphylococcus aureus infection: Secondary | ICD-10-CM | POA: Diagnosis not present

## 2022-05-17 NOTE — Progress Notes (Signed)
Established patient visit   Patient: Sean Hayden   DOB: 1946-06-15   76 y.o. Male  MRN: [id number] Visit Date: 05/20/2022  Today's healthcare provider: Lelon Huh, MD    Subjective    HPI  Diabetes Mellitus Type II, Follow-up  Lab Results  Component Value Date   HGBA1C 8.5 (H) 01/25/2022   HGBA1C 8.9 (H) 07/18/2021   HGBA1C 7.6 (H) 05/16/2020   Wt Readings from Last 3 Encounters:  05/20/22 244 lb (110.7 kg)  01/25/22 245 lb (111.1 kg)  01/03/22 245 lb 11.2 oz (111.4 kg)   Last seen for diabetes 4 months ago.  Management since then includes advising patient to get more strict with diet. He reports good compliance with treatment. He is not having side effects.  Symptoms: Yes fatigue No foot ulcerations  No appetite changes No nausea  No paresthesia of the feet  No polydipsia  No polyuria No visual disturbances   No vomiting     Home blood sugar records: fasting range: 100-120  Episodes of hypoglycemia? No    Current insulin regiment: Levemir 60 units twice daily Most Recent Eye Exam: >1 year  Current exercise: none Current diet habits: in general, an "unhealthy" diet  Pertinent Labs: Lab Results  Component Value Date   CHOL 111 01/25/2022   HDL 33 (L) 01/25/2022   LDLCALC 45 01/25/2022   TRIG 200 (H) 01/25/2022   CHOLHDL 3.4 01/25/2022   Lab Results  Component Value Date   NA 138 01/25/2022   K 4.4 01/25/2022   CREATININE 0.91 01/25/2022   EGFR 87 01/25/2022   MICROALBUR 15 02/07/2020   LABMICR 3.7 01/25/2022     ---------------------------------------------------------------------------------------------------  Hypertension, follow-up  BP Readings from Last 3 Encounters:  05/20/22 121/72  01/25/22 (!) 113/56  01/03/22 (!) 142/70   Wt Readings from Last 3 Encounters:  05/20/22 244 lb (110.7 kg)  01/25/22 245 lb (111.1 kg)  01/03/22 245 lb 11.2 oz (111.4 kg)     He was last seen for hypertension 4 months ago.  BP at that visit  was 113/56. Management since that visit includes continuing same medication.  He reports good compliance with treatment. He is not having side effects.  He is following a Regular diet. He is not exercising. He does not smoke.  Use of agents associated with hypertension: none.   Outside blood pressures are not checked. Symptoms: No chest pain No chest pressure  No palpitations No syncope  No dyspnea  No orthopnea  No paroxysmal nocturnal dyspnea Yes lower extremity edema   Pertinent labs Lab Results  Component Value Date   NA 138 01/25/2022   K 4.4 01/25/2022   CREATININE 0.91 01/25/2022   EGFR 87 01/25/2022   GLUCOSE 250 (H) 01/25/2022   TSH 4.860 (H) 01/25/2022     The ASCVD Risk score (Arnett DK, et al., 2019) failed to calculate for the following reasons:   The valid total cholesterol range is 130 to 320 mg/dL  ---------------------------------------------------------------------------------------------------  Follow up for Vitamin B12 deficiency:  The patient was last seen for this 4 months ago. Lab Results  Component Value Date   HOZYYQMG50 037 (L) 01/25/2022    Changes made at last visit include advising patient to take 1,056m of Vitamin B12 supplement once a day.  He reports good compliance with treatment.  He feels that condition is Unchanged.   -----------------------------------------------------------------------------------------   Follow up atrial fibrillation  [facility] but has  need seen cardiologist in [place] for a few years. States he does get a little short of breath on exertion, but no worse than usual. No chest pains or palpitations. Is tolerating Xarelto, denies any unusual bleeding or bruising.   He also requests refill of acyclovir which he states works well for cold sores.  Medications: Outpatient Medications Prior to Visit  Medication Sig   albuterol (VENTOLIN HFA) 108 (90 Base) MCG/ACT inhaler Inhale 2 puffs  into the lungs every 6 (six) hours as needed for wheezing or shortness of breath.   atorvastatin (LIPITOR) 20 MG tablet Take 1 tablet (20 mg total) by mouth every evening. (Patient taking differently: Take 40 mg by mouth every evening.)   benzocaine (ORAJEL) 10 % mucosal gel Use as directed 1 application in the mouth or throat as needed for mouth pain.   Cholecalciferol (VITAMIN D PO) Take 2,000 Units by mouth daily.    Docosanol 10 % CREA Apply topically up to 5 times per day as needed until heals   empagliflozin (JARDIANCE) 25 MG TABS tablet Take 25 mg by mouth daily.   Flaxseed, Linseed, (FLAXSEED OIL) 1000 MG CAPS Take 1,000 mg by mouth 2 (two) times daily.   fluticasone (FLONASE) 50 MCG/ACT nasal spray Place 2 sprays into both nostrils daily. (Patient taking differently: Place 2 sprays into both nostrils 2 (two) times daily.)   gabapentin (NEURONTIN) 100 MG capsule Take 1 capsule (100 mg total) by mouth 2 (two) times daily.   insulin detemir (LEVEMIR) 100 UNIT/ML injection Inject 0.3 mLs (30 Units total) into the skin 2 (two) times daily. (Patient taking differently: Inject 60 Units into the skin 2 (two) times daily.)   levETIRAcetam (KEPPRA) 750 MG tablet Take 2 tablets (1,500 mg total) by mouth 2 (two) times daily. 3 in the am and 3 at bedtime (Patient taking differently: Take 1,500 mg by mouth 2 (two) times daily.)   lisinopril (ZESTRIL) 10 MG tablet Take 5 mg by mouth daily.    loratadine (CLARITIN) 10 MG tablet Take 10 mg by mouth daily.    metFORMIN (GLUCOPHAGE) 850 MG tablet Take 1 tablet (850 mg total) by mouth 2 (two) times daily with a meal.   Misc Natural Products (OSTEO BI-FLEX TRIPLE STRENGTH PO) Take 1 tablet by mouth 2 (two) times daily.    MISC NATURAL PRODUCTS PO Take 1 capsule by mouth in the morning and at bedtime. Super Beta Prostate   montelukast (SINGULAIR) 10 MG tablet TAKE ONE TABLET EVERY DAY FOR CHRONIC COUGH   Omega-3 Fatty Acids (FISH OIL) 1000 MG CPDR Take 1,200 mg  by mouth 2 (two) times daily.    PARoxetine (PAXIL) 40 MG tablet Take 1 tablet (40 mg total) by mouth daily.   rivaroxaban (XARELTO) 20 MG TABS tablet Take 1 tablet (20 mg total) by mouth daily with supper.   tamsulosin (FLOMAX) 0.4 MG CAPS capsule Take 1 capsule by mouth daily.   zolpidem (AMBIEN) 10 MG tablet TAKE 1/2-1 TABLET BY MOUTH AT BEDTIME.   acyclovir (ZOVIRAX) 400 MG tablet 1/2 tablet up to 5 times a day for cold sores (Patient not taking: Reported on 05/20/2022)   No facility-administered medications prior to visit.    Review of Systems  Respiratory:  Positive for shortness of breath (during exertion).   Musculoskeletal:  Positive for joint swelling (in ankles).       Objective    BP 121/72 (BP Location: Right Arm, Patient Position: Sitting, Cuff Size: Large)   Pulse 99  Temp 98.6 F (37 C) (Oral)   Resp 20   Wt 244 lb (110.7 kg)   SpO2 98% Comment: room air  BMI 38.22 kg/m    Physical Exam    General: Appearance:    Obese male in no acute distress  Eyes:    PERRL, conjunctiva/corneas clear, EOM's intact       Lungs:     Clear to auscultation bilaterally, respirations unlabored  Heart:    Normal heart rate. Irregularly irregular rhythm.  2/6 harsh, crescendo-decrescendo, systolic murmur at right upper sternal border   MS:   All extremities are intact.    Neurologic:   Awake, alert, oriented x 3. No apparent focal neurological defect.        Results for orders placed or performed in visit on 05/20/22  POCT HgB A1C  Result Value Ref Range   Hemoglobin A1C 7.6 (A) 4.0 - 5.6 %   Est. average glucose Bld gHb Est-mCnc 171      Assessment & Plan     1. Type 2 diabetes mellitus with diabetic neuropathy, with long-term current use of insulin (HCC) A1c improved since last visit. Continue current medications.  Recheck in 3-4 months.   2. Paroxysmal atrial fibrillation (HCC) Asymptomatic. Compliant with medication.  Continue aggressive risk factor modification.  Rate well controlled tolerating NOAC.   3. Partial seizure ([place]) No seizures for years on current Keppra regiment.   4. Recurrent cold sores refill - acyclovir (ZOVIRAX) 400 MG tablet; 1/2 tablet up to 5 times a day for cold sores  Dispense: 30 tablet; Refill: 5  5. Primary hypertension Will controlled, refill.  - lisinopril (ZESTRIL) 5 MG tablet; Take 1 tablet (5 mg total) by mouth daily.  Dispense: 90 tablet; Refill: 3   Future Appointments  Date Time Provider [place]  08/21/2022  8:00 AM Birdie Sons, MD [facility]  01/06/2023 10:20 AM [facility]-NURSE HEALTH ADVISOR [facility]        The entirety of the information documented in the History of Present Illness, Review of Systems and Physical Exam were personally obtained by me. Portions of this information were initially documented by the CMA and reviewed by me for thoroughness and accuracy.     Lelon Huh, MD  [facility] [phone] (phone) [phone] (fax)  [place]

## 2022-05-20 ENCOUNTER — Ambulatory Visit (INDEPENDENT_AMBULATORY_CARE_PROVIDER_SITE_OTHER): Payer: Medicare HMO | Admitting: Family Medicine

## 2022-05-20 ENCOUNTER — Encounter: Payer: Self-pay | Admitting: Family Medicine

## 2022-05-20 VITALS — BP 121/72 | HR 99 | Temp 98.6°F | Resp 20 | Wt 244.0 lb

## 2022-05-20 DIAGNOSIS — E1140 Type 2 diabetes mellitus with diabetic neuropathy, unspecified: Secondary | ICD-10-CM | POA: Diagnosis not present

## 2022-05-20 DIAGNOSIS — I480 Paroxysmal atrial fibrillation: Secondary | ICD-10-CM | POA: Diagnosis not present

## 2022-05-20 DIAGNOSIS — R569 Unspecified convulsions: Secondary | ICD-10-CM | POA: Diagnosis not present

## 2022-05-20 DIAGNOSIS — I10 Essential (primary) hypertension: Secondary | ICD-10-CM | POA: Diagnosis not present

## 2022-05-20 DIAGNOSIS — B001 Herpesviral vesicular dermatitis: Secondary | ICD-10-CM | POA: Diagnosis not present

## 2022-05-20 DIAGNOSIS — Z794 Long term (current) use of insulin: Secondary | ICD-10-CM

## 2022-05-20 LAB — POCT GLYCOSYLATED HEMOGLOBIN (HGB A1C)
Est. average glucose Bld gHb Est-mCnc: 171
Hemoglobin A1C: 7.6 % — AB (ref 4.0–5.6)

## 2022-05-20 MED ORDER — ACYCLOVIR 400 MG PO TABS: ORAL_TABLET | ORAL | 5 refills | Status: DC

## 2022-05-20 MED ORDER — LISINOPRIL 5 MG PO TABS: 5.0000 mg | ORAL_TABLET | Freq: Every day | ORAL | 3 refills | Status: DC

## 2022-05-20 NOTE — Patient Instructions (Addendum)
Please review the attached list of medications and notify my office if there are any errors.   Please bring all of your medications to every appointment so we can make sure that our medication list is the same as yours.   Start taking vitamin B12 2,000 mg every single day. Inadequate vitamin 12 can cause all sorts of neurological and metabolic problems.

## 2022-07-03 DIAGNOSIS — L03317 Cellulitis of buttock: Secondary | ICD-10-CM | POA: Diagnosis not present

## 2022-07-03 DIAGNOSIS — Z8614 Personal history of Methicillin resistant Staphylococcus aureus infection: Secondary | ICD-10-CM | POA: Diagnosis not present

## 2022-08-21 ENCOUNTER — Ambulatory Visit: Payer: Medicare HMO | Admitting: Family Medicine

## 2022-08-23 DIAGNOSIS — E785 Hyperlipidemia, unspecified: Secondary | ICD-10-CM | POA: Insufficient documentation

## 2022-08-23 DIAGNOSIS — G4733 Obstructive sleep apnea (adult) (pediatric): Secondary | ICD-10-CM | POA: Insufficient documentation

## 2022-08-23 DIAGNOSIS — I428 Other cardiomyopathies: Secondary | ICD-10-CM | POA: Insufficient documentation

## 2022-08-23 DIAGNOSIS — I350 Nonrheumatic aortic (valve) stenosis: Secondary | ICD-10-CM

## 2022-08-23 DIAGNOSIS — E119 Type 2 diabetes mellitus without complications: Secondary | ICD-10-CM | POA: Insufficient documentation

## 2022-08-23 DIAGNOSIS — I10 Essential (primary) hypertension: Secondary | ICD-10-CM | POA: Insufficient documentation

## 2022-08-23 DIAGNOSIS — I480 Paroxysmal atrial fibrillation: Secondary | ICD-10-CM | POA: Insufficient documentation

## 2022-08-23 DIAGNOSIS — I420 Dilated cardiomyopathy: Secondary | ICD-10-CM | POA: Insufficient documentation

## 2022-08-23 DIAGNOSIS — I639 Cerebral infarction, unspecified: Secondary | ICD-10-CM | POA: Insufficient documentation

## 2022-08-23 DIAGNOSIS — I6529 Occlusion and stenosis of unspecified carotid artery: Secondary | ICD-10-CM | POA: Insufficient documentation

## 2022-08-23 DIAGNOSIS — Z7901 Long term (current) use of anticoagulants: Secondary | ICD-10-CM | POA: Insufficient documentation

## 2022-08-23 HISTORY — DX: Nonrheumatic aortic (valve) stenosis: I35.0

## 2022-08-25 DIAGNOSIS — I5020 Unspecified systolic (congestive) heart failure: Secondary | ICD-10-CM | POA: Insufficient documentation

## 2022-09-09 DIAGNOSIS — Z952 Presence of prosthetic heart valve: Secondary | ICD-10-CM

## 2022-09-09 DIAGNOSIS — Z953 Presence of xenogenic heart valve: Secondary | ICD-10-CM | POA: Insufficient documentation

## 2022-09-09 HISTORY — DX: Presence of prosthetic heart valve: Z95.2

## 2022-09-09 HISTORY — PX: TRANSCATHETER AORTIC VALVE REPLACEMENT, TRANSFEMORAL: SHX6400

## 2022-09-11 NOTE — Progress Notes (Signed)
I,Roshena L Chambers,acting as a scribe for Lelon Huh, MD.,have documented all relevant documentation on the behalf of Lelon Huh, MD,as directed by  Lelon Huh, MD while in the presence of Lelon Huh, MD.   Established patient visit   Patient: Sean Hayden   DOB: [birth year]   76 y.o. Male  MRN: [id number] Visit Date: 09/13/2022  Today's healthcare provider: Lelon Huh, MD   Chief Complaint  Patient presents with   Hospitalization Follow-up   Subjective    HPI  Follow up Hospitalization  Patient was admitted to [facility] on 09/08/2022 and discharged on 09/11/2022. He was treated for aortic valve stenosis. Treatment for this included TAVR done by Dr. Jimmye Norman on 09-09-2022 Telephone follow up was done on 09/12/2022 He reports good compliance with treatment. He reports this condition is improved. He has follow up appointment CT surgery at [facility] on 10-01-2022 -----------------------------------------------------------------------------------------   Medications: Outpatient Medications Prior to Visit  Medication Sig   acetaminophen (TYLENOL) 325 MG tablet Take 2 tablets by mouth every 6 (six) hours as needed. For 10 days   acyclovir (ZOVIRAX) 400 MG tablet 1/2 tablet up to 5 times a day for cold sores   albuterol (VENTOLIN HFA) 108 (90 Base) MCG/ACT inhaler Inhale 2 puffs into the lungs every 6 (six) hours as needed for wheezing or shortness of breath.   atorvastatin (LIPITOR) 20 MG tablet Take 1 tablet (20 mg total) by mouth every evening. (Patient taking differently: Take 40 mg by mouth every evening.)   benzocaine (ORAJEL) 10 % mucosal gel Use as directed 1 application in the mouth or throat as needed for mouth pain.   Cholecalciferol (VITAMIN D PO) Take 2,000 Units by mouth daily.    Docosanol 10 % CREA Apply topically up to 5 times per day as needed until heals   empagliflozin (JARDIANCE) 25 MG TABS tablet Take 25 mg by mouth daily.   Flaxseed,  Linseed, (FLAXSEED OIL) 1000 MG CAPS Take 1,000 mg by mouth 2 (two) times daily.   fluticasone (FLONASE) 50 MCG/ACT nasal spray Place 2 sprays into both nostrils daily. (Patient taking differently: Place 2 sprays into both nostrils 2 (two) times daily.)   furosemide (LASIX) 20 MG tablet Take 20 mg by mouth daily. Take 1 tablet (20 mg total) by mouth once daily as needed for Edema (Take 1 tablet daily as needed for weight gain >2 lbs in 24 h)   gabapentin (NEURONTIN) 100 MG capsule Take 1 capsule (100 mg total) by mouth 2 (two) times daily.   insulin detemir (LEVEMIR) 100 UNIT/ML injection Inject 0.3 mLs (30 Units total) into the skin 2 (two) times daily. (Patient taking differently: Inject 60 Units into the skin 2 (two) times daily.)   levETIRAcetam (KEPPRA) 750 MG tablet Take 2 tablets (1,500 mg total) by mouth 2 (two) times daily. 3 in the am and 3 at bedtime (Patient taking differently: Take 1,500 mg by mouth 2 (two) times daily.)   lisinopril (ZESTRIL) 2.5 MG tablet Take 5 mg by mouth daily.   loratadine (CLARITIN) 10 MG tablet Take 10 mg by mouth daily.    metFORMIN (GLUCOPHAGE) 850 MG tablet Take 1 tablet (850 mg total) by mouth 2 (two) times daily with a meal.   metoprolol tartrate (LOPRESSOR) 25 MG tablet Take 0.5 tablets by mouth every 12 (twelve) hours.   Misc Natural Products (OSTEO BI-FLEX TRIPLE STRENGTH PO) Take 1 tablet by mouth 2 (two) times daily.    MISC NATURAL  PRODUCTS PO Take 1 capsule by mouth in the morning and at bedtime. Super Beta Prostate   montelukast (SINGULAIR) 10 MG tablet TAKE ONE TABLET EVERY DAY FOR CHRONIC COUGH   Omega-3 Fatty Acids (FISH OIL) 1000 MG CPDR Take 1,200 mg by mouth 2 (two) times daily.    PARoxetine (PAXIL) 40 MG tablet Take 1 tablet (40 mg total) by mouth daily.   polyethylene glycol (MIRALAX / GLYCOLAX) 17 g packet Take by mouth. Take 1 packet (17 g total) by mouth once daily as needed for Constipation for up to 7 days Mix in 4-8ounces of fluid prior  to taking.   tamsulosin (FLOMAX) 0.4 MG CAPS capsule Take 1 capsule by mouth daily.   zolpidem (AMBIEN) 10 MG tablet TAKE 1/2-1 TABLET BY MOUTH AT BEDTIME.   rivaroxaban (XARELTO) 20 MG TABS tablet Take 1 tablet (20 mg total) by mouth daily with supper. (Patient not taking: Reported on 09/13/2022)   [DISCONTINUED] lisinopril (ZESTRIL) 5 MG tablet Take 1 tablet (5 mg total) by mouth daily.   No facility-administered medications prior to visit.    Review of Systems  Constitutional:  Positive for fatigue. Negative for appetite change, chills and fever.  Respiratory:  Negative for chest tightness, shortness of breath (during exertion) and wheezing.   Cardiovascular:  Negative for chest pain and palpitations.  Gastrointestinal:  Negative for abdominal pain, nausea and vomiting.       Objective    BP 107/66 (BP Location: Right Arm, Patient Position: Sitting, Cuff Size: Large)   Pulse 93   Temp 97.9 F (36.6 C) (Oral)   Resp 20   Wt 233 lb (105.7 kg)   SpO2 98% Comment: room air  BMI 36.49 kg/m    Physical Exam   General: Appearance:    Obese male in no acute distress  Eyes:    PERRL, conjunctiva/corneas clear, EOM's intact       Lungs:     Clear to auscultation bilaterally, respirations unlabored  Heart:    Normal heart rate. Regular rhythm. No murmurs, rubs, or gallops.    Skin:   Bilateral inguinal incisions healing well. Right incision not quite as well healed as left. Replaced right steri-strips. A few erosions superior to right incision. Applied neosporin dressing.   Neurologic:   Awake, alert, oriented x 3. No apparent focal neurological defect.         Assessment & Plan     1. S/P aortic valve replacement with bioprosthetic valve Recovering very well for TAVR. Follow up CT surgery later this month as already scheduled.   2. Presence of surgical incision Both incisions healing well, but there is are a couple of irritated erosions above right incision. No drainage and no  surrounding erythema. Applied neosporin dressing changes and will have wound recheck in 1 week.       The entirety of the information documented in the History of Present Illness, Review of Systems and Physical Exam were personally obtained by me. Portions of this information were initially documented by the CMA and reviewed by me for thoroughness and accuracy.     Lelon Huh, MD  [facility] [phone] (phone) [phone] (fax)  [place]

## 2022-09-12 ENCOUNTER — Telehealth: Payer: Self-pay | Admitting: *Deleted

## 2022-09-12 NOTE — Patient Outreach (Signed)
  Care Coordination TOC Note Transition Care Management Follow-up Telephone Call Date of discharge and from where: [facility] How have you been since you were released from the hospital? I am a little sore and a little out of breath when I go to the bathroom, but I know it will get better Any questions or concerns? No  Items Reviewed: Did the pt receive and understand the discharge instructions provided? Yes  Medications obtained and verified? Yes  Other? No  Any new allergies since your discharge? N  Dietary orders reviewed? No Do you have support at home? Yes   Home Care and Equipment/Supplies: Were home health services ordered? no If so, what is the name of the agency? N/a  Has the agency set up a time to come to the patient's home? not applicable Were any new equipment or medical supplies ordered?  No What is the name of the medical supply agency? N/a Were you able to get the supplies/equipment? not applicable Do you have any questions related to the use of the equipment or supplies? No  Functional Questionnaire: (I = Independent and D = Dependent) ADLs: I  Bathing/Dressing- I  Meal Prep- I  Eating- I  Maintaining continence- I  Transferring/Ambulation- I  Managing Meds- I  Follow up appointments reviewed:  PCP Hospital f/u appt confirmed? Yes  Scheduled to see Dr Caryn Section [id number]. [facility] f/u appt confirmed? Yes  Scheduled to see Dr Jimmye Norman [id number] 4:00        .Lab [id number] 2:10/ Radiology [id number] 3:00 Are transportation arrangements needed? No  If their condition worsens, is the pt aware to call PCP or go to the Emergency Dept.? Yes Was the patient provided with contact information for the PCP's office or ED? Yes Was to pt encouraged to call back with questions or concerns? Yes  SDOH assessments and interventions completed:   Yes  Care Coordination Interventions Activated:  Yes   Care Coordination Interventions:  No Care Coordination  interventions needed at this time.   Encounter Outcome:  Pt. Visit Completed    [place] [facility] [phone]

## 2022-09-13 ENCOUNTER — Encounter: Payer: Self-pay | Admitting: Family Medicine

## 2022-09-13 ENCOUNTER — Ambulatory Visit (INDEPENDENT_AMBULATORY_CARE_PROVIDER_SITE_OTHER): Payer: Medicare HMO | Admitting: Family Medicine

## 2022-09-13 VITALS — BP 107/66 | HR 93 | Temp 97.9°F | Resp 20 | Wt 233.0 lb

## 2022-09-13 DIAGNOSIS — Z953 Presence of xenogenic heart valve: Secondary | ICD-10-CM

## 2022-09-13 DIAGNOSIS — Z789 Other specified health status: Secondary | ICD-10-CM

## 2022-09-16 ENCOUNTER — Encounter: Payer: Self-pay | Admitting: Family Medicine

## 2022-09-16 DIAGNOSIS — Z953 Presence of xenogenic heart valve: Secondary | ICD-10-CM | POA: Insufficient documentation

## 2022-09-18 ENCOUNTER — Encounter: Payer: Self-pay | Admitting: Family Medicine

## 2022-09-18 ENCOUNTER — Ambulatory Visit (INDEPENDENT_AMBULATORY_CARE_PROVIDER_SITE_OTHER): Payer: Medicare HMO | Admitting: Family Medicine

## 2022-09-18 VITALS — BP 138/62 | HR 111 | Temp 98.2°F | Resp 16 | Wt 235.0 lb

## 2022-09-18 DIAGNOSIS — Z789 Other specified health status: Secondary | ICD-10-CM

## 2022-09-18 DIAGNOSIS — Z953 Presence of xenogenic heart valve: Secondary | ICD-10-CM | POA: Diagnosis not present

## 2022-09-18 NOTE — Progress Notes (Signed)
I,Sulibeya S Dimas,acting as a scribe for Lelon Huh, MD.,have documented all relevant documentation on the behalf of Lelon Huh, MD,as directed by  Lelon Huh, MD while in the presence of Lelon Huh, MD.   Established patient visit   Patient: Sean Hayden   DOB: 14-Jun-1946   76 y.o. Male  MRN: [id number] Visit Date: 09/18/2022  Today's healthcare provider: Lelon Huh, MD   Chief Complaint  Patient presents with   Follow-up   Subjective    HPI  Follow up for wound  The patient was last seen for this 1 weeks ago. Changes made at last visit include no changes.  He reports excellent compliance with treatment. He feels that condition is Improved. Patient denies any redness or drainage.  He is not having side effects.   -----------------------------------------------------------------------------------------   Medications: Outpatient Medications Prior to Visit  Medication Sig   acetaminophen (TYLENOL) 325 MG tablet Take 2 tablets by mouth every 6 (six) hours as needed. For 10 days   acyclovir (ZOVIRAX) 400 MG tablet 1/2 tablet up to 5 times a day for cold sores   albuterol (VENTOLIN HFA) 108 (90 Base) MCG/ACT inhaler Inhale 2 puffs into the lungs every 6 (six) hours as needed for wheezing or shortness of breath.   atorvastatin (LIPITOR) 20 MG tablet Take 1 tablet (20 mg total) by mouth every evening. (Patient taking differently: Take 40 mg by mouth every evening.)   benzocaine (ORAJEL) 10 % mucosal gel Use as directed 1 application in the mouth or throat as needed for mouth pain.   Cholecalciferol (VITAMIN D PO) Take 2,000 Units by mouth daily.    Docosanol 10 % CREA Apply topically up to 5 times per day as needed until heals   empagliflozin (JARDIANCE) 25 MG TABS tablet Take 25 mg by mouth daily.   Flaxseed, Linseed, (FLAXSEED OIL) 1000 MG CAPS Take 1,000 mg by mouth 2 (two) times daily.   fluticasone (FLONASE) 50 MCG/ACT nasal spray Place 2 sprays into  both nostrils daily. (Patient taking differently: Place 2 sprays into both nostrils 2 (two) times daily.)   furosemide (LASIX) 20 MG tablet Take 20 mg by mouth daily. Take 1 tablet (20 mg total) by mouth once daily as needed for Edema (Take 1 tablet daily as needed for weight gain >2 lbs in 24 h)   gabapentin (NEURONTIN) 100 MG capsule Take 1 capsule (100 mg total) by mouth 2 (two) times daily.   insulin detemir (LEVEMIR) 100 UNIT/ML injection Inject 0.3 mLs (30 Units total) into the skin 2 (two) times daily. (Patient taking differently: Inject 10 Units into the skin 2 (two) times daily.)   levETIRAcetam (KEPPRA) 750 MG tablet Take 2 tablets (1,500 mg total) by mouth 2 (two) times daily. 3 in the am and 3 at bedtime (Patient taking differently: Take 1,500 mg by mouth 2 (two) times daily.)   lisinopril (ZESTRIL) 2.5 MG tablet Take 5 mg by mouth daily.   loratadine (CLARITIN) 10 MG tablet Take 10 mg by mouth daily.    metFORMIN (GLUCOPHAGE) 850 MG tablet Take 1 tablet (850 mg total) by mouth 2 (two) times daily with a meal.   metoprolol tartrate (LOPRESSOR) 25 MG tablet Take 0.5 tablets by mouth every 12 (twelve) hours.   Misc Natural Products (OSTEO BI-FLEX TRIPLE STRENGTH PO) Take 1 tablet by mouth 2 (two) times daily.    MISC NATURAL PRODUCTS PO Take 1 capsule by mouth in the morning and at bedtime. Super Beta  Prostate   montelukast (SINGULAIR) 10 MG tablet TAKE ONE TABLET EVERY DAY FOR CHRONIC COUGH   Omega-3 Fatty Acids (FISH OIL) 1000 MG CPDR Take 1,200 mg by mouth 2 (two) times daily.    PARoxetine (PAXIL) 40 MG tablet Take 1 tablet (40 mg total) by mouth daily.   rivaroxaban (XARELTO) 20 MG TABS tablet Take 1 tablet (20 mg total) by mouth daily with supper.   tamsulosin (FLOMAX) 0.4 MG CAPS capsule Take 1 capsule by mouth daily.   zolpidem (AMBIEN) 10 MG tablet TAKE 1/2-1 TABLET BY MOUTH AT BEDTIME.   No facility-administered medications prior to visit.    Review of Systems   Constitutional:  Negative for appetite change, chills and fever.  Respiratory:  Positive for shortness of breath.   Cardiovascular:  Positive for leg swelling. Negative for chest pain.  Gastrointestinal:  Negative for abdominal pain, diarrhea, nausea and vomiting.  Skin:  Negative for color change.       Objective    BP 138/62 (BP Location: Left Arm, Patient Position: Sitting, Cuff Size: Large)   Pulse (!) 111   Temp 98.2 F (36.8 C) (Oral)   Resp 16   Wt 235 lb (106.6 kg)   SpO2 93%   BMI 36.81 kg/m   Physical Exam   Incisions from bilateral femoral cath insertion sites clear dry, no erythema. Erosions of right inguinal area nearly healed with no erythema.    Assessment & Plan     1. Presence of surgical incision Now healing well with no sign of infection. Continue daily neosporin bandages to healing inguinal erosions   2.  S/P aortic valve replacement with bioprosthetic valve CT surgery requested labs be done by 09-24-2022 - CBC - Comprehensive metabolic panel     The entirety of the information documented in the History of Present Illness, Review of Systems and Physical Exam were personally obtained by me. Portions of this information were initially documented by the CMA and reviewed by me for thoroughness and accuracy.     Lelon Huh, MD  [facility] [phone] (phone) [phone] (fax)  [place]

## 2022-10-01 DIAGNOSIS — Z95810 Presence of automatic (implantable) cardiac defibrillator: Secondary | ICD-10-CM | POA: Diagnosis not present

## 2022-10-01 DIAGNOSIS — Z79899 Other long term (current) drug therapy: Secondary | ICD-10-CM | POA: Diagnosis not present

## 2022-10-01 DIAGNOSIS — R9431 Abnormal electrocardiogram [ECG] [EKG]: Secondary | ICD-10-CM | POA: Diagnosis not present

## 2022-10-01 DIAGNOSIS — J90 Pleural effusion, not elsewhere classified: Secondary | ICD-10-CM | POA: Diagnosis not present

## 2022-10-01 DIAGNOSIS — I350 Nonrheumatic aortic (valve) stenosis: Secondary | ICD-10-CM | POA: Diagnosis not present

## 2022-10-01 DIAGNOSIS — Z952 Presence of prosthetic heart valve: Secondary | ICD-10-CM | POA: Diagnosis not present

## 2022-10-01 DIAGNOSIS — Z48812 Encounter for surgical aftercare following surgery on the circulatory system: Secondary | ICD-10-CM | POA: Diagnosis not present

## 2022-10-01 DIAGNOSIS — I447 Left bundle-branch block, unspecified: Secondary | ICD-10-CM | POA: Diagnosis not present

## 2022-10-16 ENCOUNTER — Encounter: Payer: Medicare HMO | Attending: Family Medicine | Admitting: *Deleted

## 2022-10-16 ENCOUNTER — Encounter: Payer: Self-pay | Admitting: *Deleted

## 2022-10-16 DIAGNOSIS — Z952 Presence of prosthetic heart valve: Secondary | ICD-10-CM | POA: Insufficient documentation

## 2022-10-16 NOTE — Progress Notes (Signed)
Virtual orientation call completed today. he has an appointment on Date: 10/24/2022  for EP eval and gym Orientation.  Documentation of diagnosis can be found in [facility] Date: 09/08/2022 .

## 2022-10-21 DIAGNOSIS — I428 Other cardiomyopathies: Secondary | ICD-10-CM | POA: Diagnosis not present

## 2022-10-21 DIAGNOSIS — Z952 Presence of prosthetic heart valve: Secondary | ICD-10-CM | POA: Diagnosis not present

## 2022-10-21 DIAGNOSIS — I480 Paroxysmal atrial fibrillation: Secondary | ICD-10-CM | POA: Diagnosis not present

## 2022-10-21 DIAGNOSIS — I10 Essential (primary) hypertension: Secondary | ICD-10-CM | POA: Diagnosis not present

## 2022-10-24 VITALS — Ht 66.75 in | Wt 236.1 lb

## 2022-10-24 DIAGNOSIS — Z952 Presence of prosthetic heart valve: Secondary | ICD-10-CM | POA: Diagnosis not present

## 2022-10-24 NOTE — Progress Notes (Signed)
Cardiac Individual Treatment Plan  Patient Details  Name: Sean Hayden MRN: [id number] Date of Birth: April 03, 1946 Referring Provider:   Flowsheet Row Cardiac Rehab from 10/24/2022 in [facility] Cardiac and Pulmonary Rehab  Referring Provider Lelon Huh MD       Initial Encounter Date:  Flowsheet Row Cardiac Rehab from 10/24/2022 in [facility] Cardiac and Pulmonary Rehab  Date 10/24/22       Visit Diagnosis: S/P TAVR (transcatheter aortic valve replacement)  Patient's Home Medications on Admission:  Current Outpatient Medications:    acyclovir (ZOVIRAX) 400 MG tablet, 1/2 tablet up to 5 times a day for cold sores, Disp: 30 tablet, Rfl: 5   albuterol (VENTOLIN HFA) 108 (90 Base) MCG/ACT inhaler, Inhale 2 puffs into the lungs every 6 (six) hours as needed for wheezing or shortness of breath., Disp: , Rfl:    atorvastatin (LIPITOR) 20 MG tablet, Take 1 tablet (20 mg total) by mouth every evening. (Patient taking differently: Take 40 mg by mouth every evening.), Disp: 90 tablet, Rfl: 3   benzocaine (ORAJEL) 10 % mucosal gel, Use as directed 1 application in the mouth or throat as needed for mouth pain., Disp: , Rfl:    cephALEXin (KEFLEX) 500 MG capsule, TAKE ONE CAPSULE BY MOUTH FOUR TIMES A DAY FOR BACTERIAL INFECTION, Disp: , Rfl:    Cholecalciferol (VITAMIN D PO), Take 2,000 Units by mouth daily. , Disp: , Rfl:    Docosanol 10 % CREA, Apply topically up to 5 times per day as needed until heals, Disp: 2 g, Rfl: 3   empagliflozin (JARDIANCE) 25 MG TABS tablet, Take 25 mg by mouth daily., Disp: 90 tablet, Rfl: 4   Flaxseed, Linseed, (FLAXSEED OIL) 1000 MG CAPS, Take 1,000 mg by mouth 2 (two) times daily., Disp: , Rfl:    fluticasone (FLONASE) 50 MCG/ACT nasal spray, Place 2 sprays into both nostrils daily. (Patient taking differently: Place 2 sprays into both nostrils 2 (two) times daily.), Disp: 16 g, Rfl: 1   furosemide (LASIX) 20 MG tablet, Take 20 mg by mouth daily. Take 1 tablet (20 mg  total) by mouth once daily as needed for Edema (Take 1 tablet daily as needed for weight gain >2 lbs in 24 h), Disp: , Rfl:    gabapentin (NEURONTIN) 100 MG capsule, Take 1 capsule (100 mg total) by mouth 2 (two) times daily., Disp: 180 capsule, Rfl: 1   insulin detemir (LEVEMIR) 100 UNIT/ML injection, Inject 0.3 mLs (30 Units total) into the skin 2 (two) times daily. (Patient taking differently: Inject 10 Units into the skin 2 (two) times daily.), Disp: 100 mL, Rfl: 1   levETIRAcetam (KEPPRA) 750 MG tablet, Take 2 tablets (1,500 mg total) by mouth 2 (two) times daily. 3 in the am and 3 at bedtime (Patient taking differently: Take 1,500 mg by mouth 2 (two) times daily.), Disp: 360 tablet, Rfl: 4   lisinopril (ZESTRIL) 2.5 MG tablet, Take 5 mg by mouth daily., Disp: , Rfl:    loratadine (CLARITIN) 10 MG tablet, Take 10 mg by mouth daily. , Disp: , Rfl:    metFORMIN (GLUCOPHAGE) 850 MG tablet, Take 1 tablet (850 mg total) by mouth 2 (two) times daily with a meal., Disp: 180 tablet, Rfl: 1   metoprolol tartrate (LOPRESSOR) 25 MG tablet, Take 0.5 tablets by mouth every 12 (twelve) hours., Disp: , Rfl:    Misc Natural Products (OSTEO BI-FLEX TRIPLE STRENGTH PO), Take 1 tablet by mouth 2 (two) times daily. , Disp: , Rfl:  MISC NATURAL PRODUCTS PO, Take 1 capsule by mouth in the morning and at bedtime. Super Beta Prostate, Disp: , Rfl:    montelukast (SINGULAIR) 10 MG tablet, TAKE ONE TABLET EVERY DAY FOR CHRONIC COUGH, Disp: 30 tablet, Rfl: 12   Omega-3 Fatty Acids (FISH OIL) 1000 MG CPDR, Take 1,200 mg by mouth 2 (two) times daily. , Disp: , Rfl:    PARoxetine (PAXIL) 40 MG tablet, Take 1 tablet (40 mg total) by mouth daily., Disp: 90 tablet, Rfl: 4   rivaroxaban (XARELTO) 20 MG TABS tablet, Take 1 tablet (20 mg total) by mouth daily with supper., Disp: 10 tablet, Rfl: 0   tamsulosin (FLOMAX) 0.4 MG CAPS capsule, Take 1 capsule by mouth daily., Disp: , Rfl:    zolpidem (AMBIEN) 10 MG tablet, TAKE 1/2-1  TABLET BY MOUTH AT BEDTIME. (Patient not taking: Reported on 10/16/2022), Disp: 90 tablet, Rfl: 3  Past Medical History: Past Medical History:  Diagnosis Date   Aortic stenosis    moderate by 02/2016 echo   Aortic valve stenosis, congenital    Moderate by echo 2017   B12 deficiency    Cancer (HCC)    skin cancer - face    CVA (cerebral vascular accident) ([place])    02/2016 no residual   Diabetes mellitus without complication ([place])    Gout    Hemorrhoids    History of kidney stones    Hyperlipidemia    Hyperplastic colon polyp    Hypertension    MRSA infection    Neuropathy    PAF (paroxysmal atrial fibrillation) ([place])    Personal history of urinary calculi 05/25/2015   Pneumonia    1990's   Seizures ([place])     Tobacco Use: Social History   Tobacco Use  Smoking Status Former   Types: Cigars   Quit date: 04/08/2012   Years since quitting: 10.5  Smokeless Tobacco Never  Tobacco Comments   cigarettes and cigars  all quit 12 years ago    Labs: Review Flowsheet  More data exists      Latest Ref Rng & Units 02/07/2020 05/16/2020 07/18/2021 01/25/2022 05/20/2022  Labs for ITP Cardiac and Pulmonary Rehab  Cholestrol 100 - 199 mg/dL 123     - 122  111  -  LDL (calc) 0 - 99 mg/dL 49     - 51  45  -  HDL-C >39 mg/dL 37     - 34  33  -  Trlycerides 0 - 149 mg/dL 184     - 231  200  -  Hemoglobin A1c 4.0 - 5.6 % 7.7     7.6  8.9  8.5  7.6     Details       This result is from an external source.          Exercise Target Goals: Exercise Program Goal: Individual exercise prescription set using results from initial 6 min walk test and THRR while considering  patient's activity barriers and safety.   Exercise Prescription Goal: Initial exercise prescription builds to 30-45 minutes a day of aerobic activity, 2-3 days per week.  Home exercise guidelines will be given to patient during program as part of exercise prescription that the participant will acknowledge.   Education:  Aerobic Exercise: - Group verbal and visual presentation on the components of exercise prescription. Introduces F.I.T.T principle from ACSM for exercise prescriptions.  Reviews F.I.T.T. principles of aerobic exercise including progression. Written material given at graduation. Flowsheet Row Cardiac  Rehab from 10/24/2022 in [facility] Cardiac and Pulmonary Rehab  Education need identified 10/24/22       Education: Resistance Exercise: - Group verbal and visual presentation on the components of exercise prescription. Introduces F.I.T.T principle from ACSM for exercise prescriptions  Reviews F.I.T.T. principles of resistance exercise including progression. Written material given at graduation.    Education: Exercise & Equipment Safety: - Individual verbal instruction and demonstration of equipment use and safety with use of the equipment. Flowsheet Row Cardiac Rehab from 10/24/2022 in [facility] Cardiac and Pulmonary Rehab  Education need identified 10/24/22  Date 10/24/22  Educator [place]  Instruction Review Code 1- Verbalizes Understanding       Education: Exercise Physiology & General Exercise Guidelines: - Group verbal and written instruction with models to review the exercise physiology of the cardiovascular system and associated critical values. Provides general exercise guidelines with specific guidelines to those with heart or lung disease.  Flowsheet Row Cardiac Rehab from 10/24/2022 in [facility] Cardiac and Pulmonary Rehab  Education need identified 10/24/22       Education: Flexibility, Balance, Mind/Body Relaxation: - Group verbal and visual presentation with interactive activity on the components of exercise prescription. Introduces F.I.T.T principle from ACSM for exercise prescriptions. Reviews F.I.T.T. principles of flexibility and balance exercise training including progression. Also discusses the mind body connection.  Reviews various relaxation techniques to help reduce and manage stress  (i.e. Deep breathing, progressive muscle relaxation, and visualization). Balance handout provided to take home. Written material given at graduation.   Activity Barriers & Risk Stratification:  Activity Barriers & Cardiac Risk Stratification - 10/24/22 1123       Activity Barriers & Cardiac Risk Stratification   Activity Barriers Deconditioning;Muscular Weakness;Shortness of Breath;Arthritis;Other (comment);Decreased Ventricular Function    Comments Seizures, resulting in hand numbness    Cardiac Risk Stratification High             6 Minute Walk:  6 Minute Walk     Row Name 10/24/22 1128         6 Minute Walk   Phase Initial     Distance 550 feet     Walk Time 6 minutes     # of Rest Breaks 0     MPH 1.04     METS 0.84     RPE 16     Perceived Dyspnea  1     VO2 Peak 2.96     Symptoms Yes (comment)     Comments Fatigue     Resting HR 90 bpm     Resting BP 118/72     Resting Oxygen Saturation  94 %     Exercise Oxygen Saturation  during 6 min walk 90 %     Max Ex. HR 120 bpm     Max Ex. BP 126/68     2 Minute Post BP 120/74              Oxygen Initial Assessment:   Oxygen Re-Evaluation:   Oxygen Discharge (Final Oxygen Re-Evaluation):   Initial Exercise Prescription:  Initial Exercise Prescription - 10/24/22 1100       Date of Initial Exercise RX and Referring Provider   Date 10/24/22    Referring Provider Lelon Huh MD      Oxygen   Maintain Oxygen Saturation 88% or higher      Recumbant Bike   Level 1    RPM 60    Watts 10    Minutes 15  METs 1      NuStep   Level 1    SPM 80    Minutes 15    METs 1      T5 Nustep   Level 1    SPM 80    Minutes 15    METs 1      Track   Laps 8    Minutes 15    METs 1.44      Prescription Details   Frequency (times per week) 2    Duration Progress to 30 minutes of continuous aerobic without signs/symptoms of physical distress      Intensity   THRR 40-80% of Max Heartrate 111 -  133    Ratings of Perceived Exertion 11-13    Perceived Dyspnea 0-4      Progression   Progression Continue to progress workloads to maintain intensity without signs/symptoms of physical distress.      Resistance Training   Training Prescription Yes    Weight 2 lb    Reps 10-15             Perform Capillary Blood Glucose checks as needed.  Exercise Prescription Changes:   Exercise Prescription Changes     Row Name 10/24/22 1100             Response to Exercise   Blood Pressure (Admit) 118/72       Blood Pressure (Exercise) 126/68       Blood Pressure (Exit) 120/74       Heart Rate (Admit) 90 bpm       Heart Rate (Exercise) 120 bpm       Heart Rate (Exit) 87 bpm       Oxygen Saturation (Admit) 94 %       Oxygen Saturation (Exercise) 90 %       Oxygen Saturation (Exit) 94 %       Rating of Perceived Exertion (Exercise) 16       Perceived Dyspnea (Exercise) 1       Symptoms Fatigue       Comments walk test results                Exercise Comments:   Exercise Goals and Review:   Exercise Goals     Row Name 10/24/22 1141             Exercise Goals   Increase Physical Activity Yes       Intervention Provide advice, education, support and counseling about physical activity/exercise needs.;Develop an individualized exercise prescription for aerobic and resistive training based on initial evaluation findings, risk stratification, comorbidities and participant's personal goals.       Expected Outcomes Short Term: Attend rehab on a regular basis to increase amount of physical activity.;Long Term: Add in home exercise to make exercise part of routine and to increase amount of physical activity.;Long Term: Exercising regularly at least 3-5 days a week.       Increase Strength and Stamina Yes       Intervention Provide advice, education, support and counseling about physical activity/exercise needs.;Develop an individualized exercise prescription for aerobic and  resistive training based on initial evaluation findings, risk stratification, comorbidities and participant's personal goals.       Expected Outcomes Short Term: Increase workloads from initial exercise prescription for resistance, speed, and METs.;Short Term: Perform resistance training exercises routinely during rehab and add in resistance training at home;Long Term: Improve cardiorespiratory fitness, muscular endurance and strength as measured by  increased METs and functional capacity (6MWT)       Able to understand and use rate of perceived exertion (RPE) scale Yes       Intervention Provide education and explanation on how to use RPE scale       Expected Outcomes Short Term: Able to use RPE daily in rehab to express subjective intensity level;Long Term:  Able to use RPE to guide intensity level when exercising independently       Able to understand and use Dyspnea scale Yes       Intervention Provide education and explanation on how to use Dyspnea scale       Expected Outcomes Short Term: Able to use Dyspnea scale daily in rehab to express subjective sense of shortness of breath during exertion;Long Term: Able to use Dyspnea scale to guide intensity level when exercising independently       Knowledge and understanding of Target Heart Rate Range (THRR) Yes       Intervention Provide education and explanation of THRR including how the numbers were predicted and where they are located for reference       Expected Outcomes Short Term: Able to state/look up THRR;Long Term: Able to use THRR to govern intensity when exercising independently;Short Term: Able to use daily as guideline for intensity in rehab       Able to check pulse independently Yes       Intervention Provide education and demonstration on how to check pulse in carotid and radial arteries.;Review the importance of being able to check your own pulse for safety during independent exercise       Expected Outcomes Short Term: Able to explain  why pulse checking is important during independent exercise;Long Term: Able to check pulse independently and accurately       Understanding of Exercise Prescription Yes       Intervention Provide education, explanation, and written materials on patient's individual exercise prescription       Expected Outcomes Short Term: Able to explain program exercise prescription;Long Term: Able to explain home exercise prescription to exercise independently                Exercise Goals Re-Evaluation :   Discharge Exercise Prescription (Final Exercise Prescription Changes):  Exercise Prescription Changes - 10/24/22 1100       Response to Exercise   Blood Pressure (Admit) 118/72    Blood Pressure (Exercise) 126/68    Blood Pressure (Exit) 120/74    Heart Rate (Admit) 90 bpm    Heart Rate (Exercise) 120 bpm    Heart Rate (Exit) 87 bpm    Oxygen Saturation (Admit) 94 %    Oxygen Saturation (Exercise) 90 %    Oxygen Saturation (Exit) 94 %    Rating of Perceived Exertion (Exercise) 16    Perceived Dyspnea (Exercise) 1    Symptoms Fatigue    Comments walk test results             Nutrition:  Target Goals: Understanding of nutrition guidelines, daily intake of sodium '1500mg'$ , cholesterol '200mg'$ , calories 30% from fat and 7% or less from saturated fats, daily to have 5 or more servings of fruits and vegetables.  Education: All About Nutrition: -Group instruction provided by verbal, written material, interactive activities, discussions, models, and posters to present general guidelines for heart healthy nutrition including fat, fiber, MyPlate, the role of sodium in heart healthy nutrition, utilization of the nutrition label, and utilization of this knowledge for meal planning.  Follow up email sent as well. Written material given at graduation. Flowsheet Row Cardiac Rehab from 10/24/2022 in [facility] Cardiac and Pulmonary Rehab  Education need identified 10/24/22       Biometrics:  Pre  Biometrics - 10/24/22 1128       Pre Biometrics   Height 5' 6.75" (1.695 m)    Weight 236 lb 1.6 oz (107.1 kg)    BMI (Calculated) 37.28    Single Leg Stand 0 seconds              Nutrition Therapy Plan and Nutrition Goals:  Nutrition Therapy & Goals - 10/24/22 1117       Intervention Plan   Intervention Prescribe, educate and counsel regarding individualized specific dietary modifications aiming towards targeted core components such as weight, hypertension, lipid management, diabetes, heart failure and other comorbidities.    Expected Outcomes Short Term Goal: Understand basic principles of dietary content, such as calories, fat, sodium, cholesterol and nutrients.;Short Term Goal: A plan has been developed with personal nutrition goals set during dietitian appointment.;Long Term Goal: Adherence to prescribed nutrition plan.             Nutrition Assessments:  MEDIFICTS Score Key: ?70 Need to make dietary changes  40-70 Heart Healthy Diet ? 40 Therapeutic Level Cholesterol Diet  Flowsheet Row Cardiac Rehab from 10/24/2022 in [facility] Cardiac and Pulmonary Rehab  Picture Your Plate Total Score on Admission 51      Picture Your Plate Scores: <53 Unhealthy dietary pattern with much room for improvement. 41-50 Dietary pattern unlikely to meet recommendations for good health and room for improvement. 51-60 More healthful dietary pattern, with some room for improvement.  >60 Healthy dietary pattern, although there may be some specific behaviors that could be improved.    Nutrition Goals Re-Evaluation:   Nutrition Goals Discharge (Final Nutrition Goals Re-Evaluation):   Psychosocial: Target Goals: Acknowledge presence or absence of significant depression and/or stress, maximize coping skills, provide positive support system. Participant is able to verbalize types and ability to use techniques and skills needed for reducing stress and depression.   Education: Stress,  Anxiety, and Depression - Group verbal and visual presentation to define topics covered.  Reviews how body is impacted by stress, anxiety, and depression.  Also discusses healthy ways to reduce stress and to treat/manage anxiety and depression.  Written material given at graduation.   Education: Sleep Hygiene -Provides group verbal and written instruction about how sleep can affect your health.  Define sleep hygiene, discuss sleep cycles and impact of sleep habits. Review good sleep hygiene tips.    Initial Review & Psychosocial Screening:  Initial Psych Review & Screening - 10/16/22 1024       Initial Review   Current issues with Current Psychotropic Meds;History of Depression   stress depression while caring for his ailing wife. doing better now     [place]? Yes   new wife, their children, 6 sons,  grandson   Concerns Recent loss of significant other    Comments first wife died after illness,  3 years later remarried.  HAa been married 3 years      Barriers   Psychosocial barriers to participate in program There are no identifiable barriers or psychosocial needs.      Screening Interventions   Interventions Encouraged to exercise    Expected Outcomes Short Term goal: Utilizing psychosocial counselor, staff and physician to assist with identification of specific Stressors or current issues  interfering with healing process. Setting desired goal for each stressor or current issue identified.;Long Term Goal: Stressors or current issues are controlled or eliminated.;Short Term goal: Identification and review with participant of any Quality of Life or Depression concerns found by scoring the questionnaire.;Long Term goal: The participant improves quality of Life and PHQ9 Scores as seen by post scores and/or verbalization of changes             Quality of Life Scores:   Quality of Life - 10/24/22 1122       Quality of Life   Select Quality of Life       Quality of Life Scores   Health/Function Pre 2037 %    Socioeconomic Pre 27.57 %    Psych/Spiritual Pre 29.14 %    Family Pre 27 %    GLOBAL Pre 24.63 %            Scores of 19 and below usually indicate a poorer quality of life in these areas.  A difference of  2-3 points is a clinically meaningful difference.  A difference of 2-3 points in the total score of the Quality of Life Index has been associated with significant improvement in overall quality of life, self-image, physical symptoms, and general health in studies assessing change in quality of life.  PHQ-9: Review Flowsheet  More data exists      10/24/2022 01/03/2022 07/18/2021 12/28/2020 12/27/2019  Depression screen PHQ 2/9  Decreased Interest 0 0 2 0 0 0  Down, Depressed, Hopeless 1 0 1 0 0 0  PHQ - 2 Score 1 0 3 0 0 0  Altered sleeping 0 - 3 - -  Tired, decreased energy 1 - 3 - -  Change in appetite 1 - 3 - -  Feeling bad or failure about yourself  0 - 0 - -  Trouble concentrating 1 - 0 - -  Moving slowly or fidgety/restless 0 - 0 - -  Suicidal thoughts 0 - 0 - -  PHQ-9 Score 4 - 12 - -  Difficult doing work/chores Somewhat difficult - Not difficult at all - -   Interpretation of Total Score  Total Score Depression Severity:  1-4 = Minimal depression, 5-9 = Mild depression, 10-14 = Moderate depression, 15-19 = Moderately severe depression, 20-27 = Severe depression   Psychosocial Evaluation and Intervention:  Psychosocial Evaluation - 10/16/22 1034       Psychosocial Evaluation & Interventions   Interventions Encouraged to exercise with the program and follow exercise prescription    Comments Dion has no barriers to attending the program. He lives with his new wife (58yr) and they have a dog. He remarried 3 years after his first wife passed away. 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 THis added stress to his life and he started PAxil. He continues to use Paxil. He stated he is  feeling less anxious at this time. He has support from his wife and children. One grandson comes to help with chores at times. He is ready to get started and work on improving his shortness of breath symptoms.    Expected Outcomes STG FHatcherattends all scheduled sessions. He is able to see decrease of his shortness of breath symptoms. He continues to have less depression/anxiety symptoms LTG FAlycontinues withhis exercise progeression and decrease of his shortness of breath symptoms. He has less depression/anxiety symptoms after discharge.    Continue Psychosocial Services  Follow up  required by staff             Psychosocial Re-Evaluation:   Psychosocial Discharge (Final Psychosocial Re-Evaluation):   Vocational Rehabilitation: Provide vocational rehab assistance to qualifying candidates.   Vocational Rehab Evaluation & Intervention:   Education: Education Goals: Education classes will be provided on a variety of topics geared toward better understanding of heart health and risk factor modification. Participant will state understanding/return demonstration of topics presented as noted by education test scores.  Learning Barriers/Preferences:  Learning Barriers/Preferences - 10/16/22 1028       Learning Barriers/Preferences   Learning Barriers None    Learning Preferences None             General Cardiac Education Topics:  AED/CPR: - Group verbal and written instruction with the use of models to demonstrate the basic use of the AED with the basic ABC's of resuscitation.   Anatomy and Cardiac Procedures: - Group verbal and visual presentation and models provide information about basic cardiac anatomy and function. Reviews the testing methods done to diagnose heart disease and the outcomes of the test results. Describes the treatment choices: Medical Management, Angioplasty, or Coronary Bypass Surgery for treating various heart conditions including Myocardial  Infarction, Angina, Valve Disease, and Cardiac Arrhythmias.  Written material given at graduation. Flowsheet Row Cardiac Rehab from 10/24/2022 in [facility] Cardiac and Pulmonary Rehab  Education need identified 10/24/22       Medication Safety: - Group verbal and visual instruction to review commonly prescribed medications for heart and lung disease. Reviews the medication, class of the drug, and side effects. Includes the steps to properly store meds and maintain the prescription regimen.  Written material given at graduation.   Intimacy: - Group verbal instruction through game format to discuss how heart and lung disease can affect sexual intimacy. Written material given at graduation..   Know Your Numbers and Heart Failure: - Group verbal and visual instruction to discuss disease risk factors for cardiac and pulmonary disease and treatment options.  Reviews associated critical values for Overweight/Obesity, Hypertension, Cholesterol, and Diabetes.  Discusses basics of heart failure: signs/symptoms and treatments.  Introduces Heart Failure Zone chart for action plan for heart failure.  Written material given at graduation.   Infection Prevention: - Provides verbal and written material to individual with discussion of infection control including proper hand washing and proper equipment cleaning during exercise session. Flowsheet Row Cardiac Rehab from 10/24/2022 in [facility] Cardiac and Pulmonary Rehab  Education need identified 10/24/22  Date 10/24/22  Educator [place]  Instruction Review Code 1- Verbalizes Understanding       Falls Prevention: - Provides verbal and written material to individual with discussion of falls prevention and safety. Flowsheet Row Cardiac Rehab from 10/16/2022 in [facility] Cardiac and Pulmonary Rehab  Date 10/16/22  Educator [place]  Instruction Review Code 1- Verbalizes Understanding       Other: -Provides group and verbal instruction on various topics (see  comments)   Knowledge Questionnaire Score:  Knowledge Questionnaire Score - 10/24/22 1114       Knowledge Questionnaire Score   Pre Score 22/26             Core Components/Risk Factors/Patient Goals at Admission:  Personal Goals and Risk Factors at Admission - 10/24/22 1142       Core Components/Risk Factors/Patient Goals on Admission    Weight Management Yes;Weight Loss;Obesity    Intervention Weight Management: Develop a combined nutrition and exercise program designed to reach desired caloric intake,  while maintaining appropriate intake of nutrient and fiber, sodium and fats, and appropriate energy expenditure required for the weight goal.;Weight Management/Obesity: Establish reasonable short term and long term weight goals.;Weight Management: Provide education and appropriate resources to help participant work on and attain dietary goals.    Admit Weight 236 lb (107 kg)    Goal Weight: Short Term 230 lb (104.3 kg)    Goal Weight: Long Term 200 lb (90.7 kg)    Expected Outcomes Short Term: Continue to assess and modify interventions until short term weight is achieved;Long Term: Adherence to nutrition and physical activity/exercise program aimed toward attainment of established weight goal;Weight Loss: Understanding of general recommendations for a balanced deficit meal plan, which promotes 1-2 lb weight loss per week and includes a negative energy balance of [phone] kcal/d;Understanding recommendations for meals to include 15-35% energy as protein, 25-35% energy from fat, 35-60% energy from carbohydrates, less than '200mg'$  of dietary cholesterol, 20-35 gm of total fiber daily;Understanding of distribution of calorie intake throughout the day with the consumption of 4-5 meals/snacks    Diabetes Yes    Intervention Provide education about signs/symptoms and action to take for hypo/hyperglycemia.;Provide education about proper nutrition, including hydration, and aerobic/resistive exercise  prescription along with prescribed medications to achieve blood glucose in normal ranges: Fasting glucose 65-99 mg/dL    Expected Outcomes Short Term: Participant verbalizes understanding of the signs/symptoms and immediate care of hyper/hypoglycemia, proper foot care and importance of medication, aerobic/resistive exercise and nutrition plan for blood glucose control.;Long Term: Attainment of HbA1C < 7%.    Heart Failure Yes    Intervention Provide a combined exercise and nutrition program that is supplemented with education, support and counseling about heart failure. Directed toward relieving symptoms such as shortness of breath, decreased exercise tolerance, and extremity edema.    Expected Outcomes Improve functional capacity of life;Short term: Attendance in program 2-3 days a week with increased exercise capacity. Reported lower sodium intake. Reported increased fruit and vegetable intake. Reports medication compliance.;Short term: Daily weights obtained and reported for increase. Utilizing diuretic protocols set by physician.;Long term: Adoption of self-care skills and reduction of barriers for early signs and symptoms recognition and intervention leading to self-care maintenance.    Hypertension Yes    Intervention Provide education on lifestyle modifcations including regular physical activity/exercise, weight management, moderate sodium restriction and increased consumption of fresh fruit, vegetables, and low fat dairy, alcohol moderation, and smoking cessation.;Monitor prescription use compliance.    Expected Outcomes Short Term: Continued assessment and intervention until BP is < 140/9m HG in hypertensive participants. < 130/837mHG in hypertensive participants with diabetes, heart failure or chronic kidney disease.;Long Term: Maintenance of blood pressure at goal levels.    Lipids Yes    Intervention Provide education and support for participant on nutrition & aerobic/resistive exercise along  with prescribed medications to achieve LDL '70mg'$ , HDL >'40mg'$ .    Expected Outcomes Short Term: Participant states understanding of desired cholesterol values and is compliant with medications prescribed. Participant is following exercise prescription and nutrition guidelines.;Long Term: Cholesterol controlled with medications as prescribed, with individualized exercise RX and with personalized nutrition plan. Value goals: LDL < '70mg'$ , HDL > 40 mg.             Education:Diabetes - Individual verbal and written instruction to review signs/symptoms of diabetes, desired ranges of glucose level fasting, after meals and with exercise. Acknowledge that pre and post exercise glucose checks will be done for 3 sessions at entry of program.  Flowsheet Row Cardiac Rehab from 10/24/2022 in [facility] Cardiac and Pulmonary Rehab  Education need identified 10/24/22  Date 10/24/22  Educator [place]  Instruction Review Code 1- Verbalizes Understanding       Core Components/Risk Factors/Patient Goals Review:    Core Components/Risk Factors/Patient Goals at Discharge (Final Review):    ITP Comments:  ITP Comments     Row Name 10/16/22 1004 10/24/22 1113         ITP Comments Virtual orientation call completed today. he has an appointment on Date: 10/24/2022  for EP eval and gym Orientation.  Documentation of diagnosis can be found in [facility] Date: 09/08/2022 . Completed 6MWT and gym orientation. Initial ITP created and sent for review to Dr. Emily Filbert, Medical Director.               Comments: Initial ITP

## 2022-10-24 NOTE — Patient Instructions (Signed)
Patient Instructions  Patient Details  Name: Sean Hayden MRN: [id number] Date of Birth: Sep 06, 1946 Referring Provider:  Birdie Sons, MD  Below are your personal goals for exercise, nutrition, and risk factors. Our goal is to help you stay on track towards obtaining and maintaining these goals. We will be discussing your progress on these goals with you throughout the program.  Initial Exercise Prescription:  Initial Exercise Prescription - 10/24/22 1100       Date of Initial Exercise RX and Referring Provider   Date 10/24/22    Referring Provider Lelon Huh MD      Oxygen   Maintain Oxygen Saturation 88% or higher      Recumbant Bike   Level 1    RPM 60    Watts 10    Minutes 15    METs 1      NuStep   Level 1    SPM 80    Minutes 15    METs 1      T5 Nustep   Level 1    SPM 80    Minutes 15    METs 1      Track   Laps 8    Minutes 15    METs 1.44      Prescription Details   Frequency (times per week) 2    Duration Progress to 30 minutes of continuous aerobic without signs/symptoms of physical distress      Intensity   THRR 40-80% of Max Heartrate 111 - 133    Ratings of Perceived Exertion 11-13    Perceived Dyspnea 0-4      Progression   Progression Continue to progress workloads to maintain intensity without signs/symptoms of physical distress.      Resistance Training   Training Prescription Yes    Weight 2 lb    Reps 10-15             Exercise Goals: Frequency: Be able to perform aerobic exercise two to three times per week in program working toward 2-5 days per week of home exercise.  Intensity: Work with a perceived exertion of 11 (fairly light) - 15 (hard) while following your exercise prescription.  We will make changes to your prescription with you as you progress through the program.   Duration: Be able to do 30 to 45 minutes of continuous aerobic exercise in addition to a 5 minute warm-up and a 5 minute cool-down  routine.   Nutrition Goals: Your personal nutrition goals will be established when you do your nutrition analysis with the dietician.  The following are general nutrition guidelines to follow: Cholesterol < '200mg'$ /day Sodium < '1500mg'$ /day Fiber: Men over 50 yrs - 30 grams per day  Personal Goals:  Personal Goals and Risk Factors at Admission - 10/24/22 1142       Core Components/Risk Factors/Patient Goals on Admission    Weight Management Yes;Weight Loss;Obesity    Intervention Weight Management: Develop a combined nutrition and exercise program designed to reach desired caloric intake, while maintaining appropriate intake of nutrient and fiber, sodium and fats, and appropriate energy expenditure required for the weight goal.;Weight Management/Obesity: Establish reasonable short term and long term weight goals.;Weight Management: Provide education and appropriate resources to help participant work on and attain dietary goals.    Admit Weight 236 lb (107 kg)    Goal Weight: Short Term 230 lb (104.3 kg)    Goal Weight: Long Term 200 lb (90.7 kg)    Expected  Outcomes Short Term: Continue to assess and modify interventions until short term weight is achieved;Long Term: Adherence to nutrition and physical activity/exercise program aimed toward attainment of established weight goal;Weight Loss: Understanding of general recommendations for a balanced deficit meal plan, which promotes 1-2 lb weight loss per week and includes a negative energy balance of [phone] kcal/d;Understanding recommendations for meals to include 15-35% energy as protein, 25-35% energy from fat, 35-60% energy from carbohydrates, less than '200mg'$  of dietary cholesterol, 20-35 gm of total fiber daily;Understanding of distribution of calorie intake throughout the day with the consumption of 4-5 meals/snacks    Diabetes Yes    Intervention Provide education about signs/symptoms and action to take for hypo/hyperglycemia.;Provide  education about proper nutrition, including hydration, and aerobic/resistive exercise prescription along with prescribed medications to achieve blood glucose in normal ranges: Fasting glucose 65-99 mg/dL    Expected Outcomes Short Term: Participant verbalizes understanding of the signs/symptoms and immediate care of hyper/hypoglycemia, proper foot care and importance of medication, aerobic/resistive exercise and nutrition plan for blood glucose control.;Long Term: Attainment of HbA1C < 7%.    Heart Failure Yes    Intervention Provide a combined exercise and nutrition program that is supplemented with education, support and counseling about heart failure. Directed toward relieving symptoms such as shortness of breath, decreased exercise tolerance, and extremity edema.    Expected Outcomes Improve functional capacity of life;Short term: Attendance in program 2-3 days a week with increased exercise capacity. Reported lower sodium intake. Reported increased fruit and vegetable intake. Reports medication compliance.;Short term: Daily weights obtained and reported for increase. Utilizing diuretic protocols set by physician.;Long term: Adoption of self-care skills and reduction of barriers for early signs and symptoms recognition and intervention leading to self-care maintenance.    Hypertension Yes    Intervention Provide education on lifestyle modifcations including regular physical activity/exercise, weight management, moderate sodium restriction and increased consumption of fresh fruit, vegetables, and low fat dairy, alcohol moderation, and smoking cessation.;Monitor prescription use compliance.    Expected Outcomes Short Term: Continued assessment and intervention until BP is < 140/60m HG in hypertensive participants. < 130/825mHG in hypertensive participants with diabetes, heart failure or chronic kidney disease.;Long Term: Maintenance of blood pressure at goal levels.    Lipids Yes    Intervention Provide  education and support for participant on nutrition & aerobic/resistive exercise along with prescribed medications to achieve LDL '70mg'$ , HDL >'40mg'$ .    Expected Outcomes Short Term: Participant states understanding of desired cholesterol values and is compliant with medications prescribed. Participant is following exercise prescription and nutrition guidelines.;Long Term: Cholesterol controlled with medications as prescribed, with individualized exercise RX and with personalized nutrition plan. Value goals: LDL < '70mg'$ , HDL > 40 mg.             Tobacco Use Initial Evaluation: Social History   Tobacco Use  Smoking Status Former   Types: Cigars   Quit date: 04/08/2012   Years since quitting: 10.5  Smokeless Tobacco Never  Tobacco Comments   cigarettes and cigars  all quit 12 years ago    Exercise Goals and Review:  Exercise Goals     Row Name 10/24/22 1141             Exercise Goals   Increase Physical Activity Yes       Intervention Provide advice, education, support and counseling about physical activity/exercise needs.;Develop an individualized exercise prescription for aerobic and resistive training based on initial evaluation findings, risk stratification, comorbidities and participant's  personal goals.       Expected Outcomes Short Term: Attend rehab on a regular basis to increase amount of physical activity.;Long Term: Add in home exercise to make exercise part of routine and to increase amount of physical activity.;Long Term: Exercising regularly at least 3-5 days a week.       Increase Strength and Stamina Yes       Intervention Provide advice, education, support and counseling about physical activity/exercise needs.;Develop an individualized exercise prescription for aerobic and resistive training based on initial evaluation findings, risk stratification, comorbidities and participant's personal goals.       Expected Outcomes Short Term: Increase workloads from initial  exercise prescription for resistance, speed, and METs.;Short Term: Perform resistance training exercises routinely during rehab and add in resistance training at home;Long Term: Improve cardiorespiratory fitness, muscular endurance and strength as measured by increased METs and functional capacity (6MWT)       Able to understand and use rate of perceived exertion (RPE) scale Yes       Intervention Provide education and explanation on how to use RPE scale       Expected Outcomes Short Term: Able to use RPE daily in rehab to express subjective intensity level;Long Term:  Able to use RPE to guide intensity level when exercising independently       Able to understand and use Dyspnea scale Yes       Intervention Provide education and explanation on how to use Dyspnea scale       Expected Outcomes Short Term: Able to use Dyspnea scale daily in rehab to express subjective sense of shortness of breath during exertion;Long Term: Able to use Dyspnea scale to guide intensity level when exercising independently       Knowledge and understanding of Target Heart Rate Range (THRR) Yes       Intervention Provide education and explanation of THRR including how the numbers were predicted and where they are located for reference       Expected Outcomes Short Term: Able to state/look up THRR;Long Term: Able to use THRR to govern intensity when exercising independently;Short Term: Able to use daily as guideline for intensity in rehab       Able to check pulse independently Yes       Intervention Provide education and demonstration on how to check pulse in carotid and radial arteries.;Review the importance of being able to check your own pulse for safety during independent exercise       Expected Outcomes Short Term: Able to explain why pulse checking is important during independent exercise;Long Term: Able to check pulse independently and accurately       Understanding of Exercise Prescription Yes       Intervention  Provide education, explanation, and written materials on patient's individual exercise prescription       Expected Outcomes Short Term: Able to explain program exercise prescription;Long Term: Able to explain home exercise prescription to exercise independently                Copy of goals given to participant.

## 2022-10-28 ENCOUNTER — Encounter: Payer: Medicare HMO | Admitting: *Deleted

## 2022-10-28 DIAGNOSIS — Z952 Presence of prosthetic heart valve: Secondary | ICD-10-CM

## 2022-10-28 NOTE — Progress Notes (Signed)
Daily Session Note  Patient Details  Name: Sean Hayden MRN: [id number] Date of Birth: [birth year] Referring Provider:   Flowsheet Row Cardiac Rehab from 10/24/2022 in [facility] Cardiac and Pulmonary Rehab  Referring Provider Lelon Huh MD       Encounter Date: 10/28/2022  Check In:  Session Check In - 10/28/22 1606       Check-In   Supervising physician immediately available to respond to emergencies See telemetry face sheet for immediately available ER MD    Location [facility]-Cardiac & Pulmonary Rehab    Staff Present Renita Papa, RN BSN;Laureen Owens Shark, BS, RRT, CPFT;Noah Tickle, BS, Exercise Physiologist    Virtual Visit No    Medication changes reported     No    Fall or balance concerns reported    No    Warm-up and Cool-down Performed on first and last piece of equipment    Resistance Training Performed Yes    VAD Patient? No    PAD/SET Patient? No      Pain Assessment   Currently in Pain? No/denies                Social History   Tobacco Use  Smoking Status Former   Types: Cigars   Quit date: 04/08/2012   Years since quitting: 10.5  Smokeless Tobacco Never  Tobacco Comments   cigarettes and cigars  all quit 12 years ago    Goals Met:  Independence with exercise equipment Exercise tolerated well No report of concerns or symptoms today Strength training completed today  Goals Unmet:  Not Applicable  Comments: Pt able to follow exercise prescription today without complaint.  Will continue to monitor for progression.    Dr. Emily Filbert is Medical Director for [place].  Dr. Ottie Glazier is Medical Director for [facility] Pulmonary Rehabilitation.

## 2022-10-30 ENCOUNTER — Encounter: Payer: Medicare HMO | Admitting: *Deleted

## 2022-10-30 DIAGNOSIS — Z952 Presence of prosthetic heart valve: Secondary | ICD-10-CM | POA: Diagnosis not present

## 2022-10-30 LAB — GLUCOSE, CAPILLARY: Glucose-Capillary: 180 mg/dL — ABNORMAL HIGH (ref 70–99)

## 2022-10-30 NOTE — Progress Notes (Signed)
Daily Session Note  Patient Details  Name: Sean Hayden MRN: [id number] Date of Birth: 1946-09-24 Referring Provider:   Flowsheet Row Cardiac Rehab from 10/24/2022 in [facility] Cardiac and Pulmonary Rehab  Referring Provider Lelon Huh MD       Encounter Date: 10/30/2022  Check In:  Session Check In - 10/30/22 1600       Check-In   Supervising physician immediately available to respond to emergencies See telemetry face sheet for immediately available ER MD    Location [facility]-Cardiac & Pulmonary Rehab    Staff Present Carson Myrtle, BS, RRT, CPFT;Kaelea Gathright Sherryll Burger, RN Odelia Gage, RN, ADN    Virtual Visit No    Medication changes reported     No    Fall or balance concerns reported    No    Warm-up and Cool-down Performed on first and last piece of equipment    Resistance Training Performed Yes    VAD Patient? No    PAD/SET Patient? No      Pain Assessment   Currently in Pain? No/denies                Social History   Tobacco Use  Smoking Status Former   Types: Cigars   Quit date: 04/08/2012   Years since quitting: 10.5  Smokeless Tobacco Never  Tobacco Comments   cigarettes and cigars  all quit 12 years ago    Goals Met:  Independence with exercise equipment Exercise tolerated well No report of concerns or symptoms today Strength training completed today  Goals Unmet:  Not Applicable  Comments: Pt able to follow exercise prescription today without complaint.  Will continue to monitor for progression.    Dr. Emily Filbert is Medical Director for [place].  Dr. Ottie Glazier is Medical Director for [facility] Pulmonary Rehabilitation.

## 2022-11-06 ENCOUNTER — Encounter: Payer: Self-pay | Admitting: *Deleted

## 2022-11-06 DIAGNOSIS — Z952 Presence of prosthetic heart valve: Secondary | ICD-10-CM

## 2022-11-06 NOTE — Progress Notes (Signed)
Cardiac Individual Treatment Plan  Patient Details  Name: Sean Hayden MRN: [id number] Date of Birth: 01-19-1946 Referring Provider:   Flowsheet Row Cardiac Rehab from 10/24/2022 in [facility] Cardiac and Pulmonary Rehab  Referring Provider Lelon Huh MD       Initial Encounter Date:  Flowsheet Row Cardiac Rehab from 10/24/2022 in [facility] Cardiac and Pulmonary Rehab  Date 10/24/22       Visit Diagnosis: S/P TAVR (transcatheter aortic valve replacement)  Patient's Home Medications on Admission:  Current Outpatient Medications:    acyclovir (ZOVIRAX) 400 MG tablet, 1/2 tablet up to 5 times a day for cold sores, Disp: 30 tablet, Rfl: 5   albuterol (VENTOLIN HFA) 108 (90 Base) MCG/ACT inhaler, Inhale 2 puffs into the lungs every 6 (six) hours as needed for wheezing or shortness of breath., Disp: , Rfl:    atorvastatin (LIPITOR) 20 MG tablet, Take 1 tablet (20 mg total) by mouth every evening. (Patient taking differently: Take 40 mg by mouth every evening.), Disp: 90 tablet, Rfl: 3   benzocaine (ORAJEL) 10 % mucosal gel, Use as directed 1 application in the mouth or throat as needed for mouth pain., Disp: , Rfl:    cephALEXin (KEFLEX) 500 MG capsule, TAKE ONE CAPSULE BY MOUTH FOUR TIMES A DAY FOR BACTERIAL INFECTION, Disp: , Rfl:    Cholecalciferol (VITAMIN D PO), Take 2,000 Units by mouth daily. , Disp: , Rfl:    Docosanol 10 % CREA, Apply topically up to 5 times per day as needed until heals, Disp: 2 g, Rfl: 3   empagliflozin (JARDIANCE) 25 MG TABS tablet, Take 25 mg by mouth daily., Disp: 90 tablet, Rfl: 4   Flaxseed, Linseed, (FLAXSEED OIL) 1000 MG CAPS, Take 1,000 mg by mouth 2 (two) times daily., Disp: , Rfl:    fluticasone (FLONASE) 50 MCG/ACT nasal spray, Place 2 sprays into both nostrils daily. (Patient taking differently: Place 2 sprays into both nostrils 2 (two) times daily.), Disp: 16 g, Rfl: 1   furosemide (LASIX) 20 MG tablet, Take 20 mg by mouth daily. Take 1 tablet (20 mg  total) by mouth once daily as needed for Edema (Take 1 tablet daily as needed for weight gain >2 lbs in 24 h), Disp: , Rfl:    gabapentin (NEURONTIN) 100 MG capsule, Take 1 capsule (100 mg total) by mouth 2 (two) times daily., Disp: 180 capsule, Rfl: 1   insulin detemir (LEVEMIR) 100 UNIT/ML injection, Inject 0.3 mLs (30 Units total) into the skin 2 (two) times daily. (Patient taking differently: Inject 10 Units into the skin 2 (two) times daily.), Disp: 100 mL, Rfl: 1   levETIRAcetam (KEPPRA) 750 MG tablet, Take 2 tablets (1,500 mg total) by mouth 2 (two) times daily. 3 in the am and 3 at bedtime (Patient taking differently: Take 1,500 mg by mouth 2 (two) times daily.), Disp: 360 tablet, Rfl: 4   lisinopril (ZESTRIL) 2.5 MG tablet, Take 5 mg by mouth daily., Disp: , Rfl:    loratadine (CLARITIN) 10 MG tablet, Take 10 mg by mouth daily. , Disp: , Rfl:    metFORMIN (GLUCOPHAGE) 850 MG tablet, Take 1 tablet (850 mg total) by mouth 2 (two) times daily with a meal., Disp: 180 tablet, Rfl: 1   metoprolol tartrate (LOPRESSOR) 25 MG tablet, Take 0.5 tablets by mouth every 12 (twelve) hours., Disp: , Rfl:    Misc Natural Products (OSTEO BI-FLEX TRIPLE STRENGTH PO), Take 1 tablet by mouth 2 (two) times daily. , Disp: , Rfl:  MISC NATURAL PRODUCTS PO, Take 1 capsule by mouth in the morning and at bedtime. Super Beta Prostate, Disp: , Rfl:    montelukast (SINGULAIR) 10 MG tablet, TAKE ONE TABLET EVERY DAY FOR CHRONIC COUGH, Disp: 30 tablet, Rfl: 12   Omega-3 Fatty Acids (FISH OIL) 1000 MG CPDR, Take 1,200 mg by mouth 2 (two) times daily. , Disp: , Rfl:    PARoxetine (PAXIL) 40 MG tablet, Take 1 tablet (40 mg total) by mouth daily., Disp: 90 tablet, Rfl: 4   rivaroxaban (XARELTO) 20 MG TABS tablet, Take 1 tablet (20 mg total) by mouth daily with supper., Disp: 10 tablet, Rfl: 0   tamsulosin (FLOMAX) 0.4 MG CAPS capsule, Take 1 capsule by mouth daily., Disp: , Rfl:    zolpidem (AMBIEN) 10 MG tablet, TAKE 1/2-1  TABLET BY MOUTH AT BEDTIME. (Patient not taking: Reported on 10/16/2022), Disp: 90 tablet, Rfl: 3  Past Medical History: Past Medical History:  Diagnosis Date   Aortic stenosis    moderate by 02/2016 echo   Aortic valve stenosis, congenital    Moderate by echo 2017   B12 deficiency    Cancer (HCC)    skin cancer - face    CVA (cerebral vascular accident) ([place])    02/2016 no residual   Diabetes mellitus without complication ([place])    Gout    Hemorrhoids    History of kidney stones    Hyperlipidemia    Hyperplastic colon polyp    Hypertension    MRSA infection    Neuropathy    PAF (paroxysmal atrial fibrillation) ([place])    Personal history of urinary calculi 05/25/2015   Pneumonia    1990's   Seizures ([place])     Tobacco Use: Social History   Tobacco Use  Smoking Status Former   Types: Cigars   Quit date: 04/08/2012   Years since quitting: 10.5  Smokeless Tobacco Never  Tobacco Comments   cigarettes and cigars  all quit 12 years ago    Labs: Review Flowsheet  More data exists      Latest Ref Rng & Units 02/07/2020 05/16/2020 07/18/2021 01/25/2022 05/20/2022  Labs for ITP Cardiac and Pulmonary Rehab  Cholestrol 100 - 199 mg/dL 123     - 122  111  -  LDL (calc) 0 - 99 mg/dL 49     - 51  45  -  HDL-C >39 mg/dL 37     - 34  33  -  Trlycerides 0 - 149 mg/dL 184     - 231  200  -  Hemoglobin A1c 4.0 - 5.6 % 7.7     7.6  8.9  8.5  7.6     Details       This result is from an external source.          Exercise Target Goals: Exercise Program Goal: Individual exercise prescription set using results from initial 6 min walk test and THRR while considering  patient's activity barriers and safety.   Exercise Prescription Goal: Initial exercise prescription builds to 30-45 minutes a day of aerobic activity, 2-3 days per week.  Home exercise guidelines will be given to patient during program as part of exercise prescription that the participant will acknowledge.   Education:  Aerobic Exercise: - Group verbal and visual presentation on the components of exercise prescription. Introduces F.I.T.T principle from ACSM for exercise prescriptions.  Reviews F.I.T.T. principles of aerobic exercise including progression. Written material given at graduation. Flowsheet Row Cardiac  Rehab from 10/24/2022 in [facility] Cardiac and Pulmonary Rehab  Education need identified 10/24/22       Education: Resistance Exercise: - Group verbal and visual presentation on the components of exercise prescription. Introduces F.I.T.T principle from ACSM for exercise prescriptions  Reviews F.I.T.T. principles of resistance exercise including progression. Written material given at graduation.    Education: Exercise & Equipment Safety: - Individual verbal instruction and demonstration of equipment use and safety with use of the equipment. Flowsheet Row Cardiac Rehab from 10/24/2022 in [facility] Cardiac and Pulmonary Rehab  Education need identified 10/24/22  Date 10/24/22  Educator [place]  Instruction Review Code 1- Verbalizes Understanding       Education: Exercise Physiology & General Exercise Guidelines: - Group verbal and written instruction with models to review the exercise physiology of the cardiovascular system and associated critical values. Provides general exercise guidelines with specific guidelines to those with heart or lung disease.  Flowsheet Row Cardiac Rehab from 10/24/2022 in [facility] Cardiac and Pulmonary Rehab  Education need identified 10/24/22       Education: Flexibility, Balance, Mind/Body Relaxation: - Group verbal and visual presentation with interactive activity on the components of exercise prescription. Introduces F.I.T.T principle from ACSM for exercise prescriptions. Reviews F.I.T.T. principles of flexibility and balance exercise training including progression. Also discusses the mind body connection.  Reviews various relaxation techniques to help reduce and manage stress  (i.e. Deep breathing, progressive muscle relaxation, and visualization). Balance handout provided to take home. Written material given at graduation.   Activity Barriers & Risk Stratification:  Activity Barriers & Cardiac Risk Stratification - 10/24/22 1123       Activity Barriers & Cardiac Risk Stratification   Activity Barriers Deconditioning;Muscular Weakness;Shortness of Breath;Arthritis;Other (comment);Decreased Ventricular Function    Comments Seizures, resulting in hand numbness    Cardiac Risk Stratification High             6 Minute Walk:  6 Minute Walk     Row Name 10/24/22 1128         6 Minute Walk   Phase Initial     Distance 550 feet     Walk Time 6 minutes     # of Rest Breaks 0     MPH 1.04     METS 0.84     RPE 16     Perceived Dyspnea  1     VO2 Peak 2.96     Symptoms Yes (comment)     Comments Fatigue     Resting HR 90 bpm     Resting BP 118/72     Resting Oxygen Saturation  94 %     Exercise Oxygen Saturation  during 6 min walk 90 %     Max Ex. HR 120 bpm     Max Ex. BP 126/68     2 Minute Post BP 120/74              Oxygen Initial Assessment:   Oxygen Re-Evaluation:   Oxygen Discharge (Final Oxygen Re-Evaluation):   Initial Exercise Prescription:  Initial Exercise Prescription - 10/24/22 1100       Date of Initial Exercise RX and Referring Provider   Date 10/24/22    Referring Provider Lelon Huh MD      Oxygen   Maintain Oxygen Saturation 88% or higher      Recumbant Bike   Level 1    RPM 60    Watts 10    Minutes 15  METs 1      NuStep   Level 1    SPM 80    Minutes 15    METs 1      T5 Nustep   Level 1    SPM 80    Minutes 15    METs 1      Track   Laps 8    Minutes 15    METs 1.44      Prescription Details   Frequency (times per week) 2    Duration Progress to 30 minutes of continuous aerobic without signs/symptoms of physical distress      Intensity   THRR 40-80% of Max Heartrate 111 -  133    Ratings of Perceived Exertion 11-13    Perceived Dyspnea 0-4      Progression   Progression Continue to progress workloads to maintain intensity without signs/symptoms of physical distress.      Resistance Training   Training Prescription Yes    Weight 2 lb    Reps 10-15             Perform Capillary Blood Glucose checks as needed.  Exercise Prescription Changes:   Exercise Prescription Changes     Row Name 10/24/22 1100 11/04/22 1500           Response to Exercise   Blood Pressure (Admit) 118/72 108/70      Blood Pressure (Exercise) 126/68 120/72      Blood Pressure (Exit) 120/74 106/66      Heart Rate (Admit) 90 bpm 92 bpm      Heart Rate (Exercise) 120 bpm 112 bpm      Heart Rate (Exit) 87 bpm 99 bpm      Oxygen Saturation (Admit) 94 % --      Oxygen Saturation (Exercise) 90 % --      Oxygen Saturation (Exit) 94 % --      Rating of Perceived Exertion (Exercise) 16 15      Perceived Dyspnea (Exercise) 1 --      Symptoms Fatigue none      Comments walk test results 2nd full day of exercise      Duration -- Progress to 30 minutes of  aerobic without signs/symptoms of physical distress      Intensity -- THRR unchanged        Progression   Progression -- Continue to progress workloads to maintain intensity without signs/symptoms of physical distress.      Average METs -- 2.01        Resistance Training   Training Prescription -- Yes      Weight -- 2 lb      Reps -- 10-15        Interval Training   Interval Training -- No        NuStep   Level -- 1      Minutes -- 15      METs -- 2        Track   Laps -- 14      Minutes -- 15      METs -- 1.76        Oxygen   Maintain Oxygen Saturation -- 88% or higher               Exercise Comments:   Exercise Comments     Row Name 10/30/22 1711           Exercise Comments First full day of exercise!  Patient was  oriented to gym and equipment including functions, settings, policies, and  procedures.  Patient's individual exercise prescription and treatment plan were reviewed.  All starting workloads were established based on the results of the 6 minute walk test done at initial orientation visit.  The plan for exercise progression was also introduced and progression will be customized based on patient's performance and goals.                Exercise Goals and Review:   Exercise Goals     Row Name 10/24/22 1141             Exercise Goals   Increase Physical Activity Yes       Intervention Provide advice, education, support and counseling about physical activity/exercise needs.;Develop an individualized exercise prescription for aerobic and resistive training based on initial evaluation findings, risk stratification, comorbidities and participant's personal goals.       Expected Outcomes Short Term: Attend rehab on a regular basis to increase amount of physical activity.;Long Term: Add in home exercise to make exercise part of routine and to increase amount of physical activity.;Long Term: Exercising regularly at least 3-5 days a week.       Increase Strength and Stamina Yes       Intervention Provide advice, education, support and counseling about physical activity/exercise needs.;Develop an individualized exercise prescription for aerobic and resistive training based on initial evaluation findings, risk stratification, comorbidities and participant's personal goals.       Expected Outcomes Short Term: Increase workloads from initial exercise prescription for resistance, speed, and METs.;Short Term: Perform resistance training exercises routinely during rehab and add in resistance training at home;Long Term: Improve cardiorespiratory fitness, muscular endurance and strength as measured by increased METs and functional capacity (6MWT)       Able to understand and use rate of perceived exertion (RPE) scale Yes       Intervention Provide education and explanation on how to use  RPE scale       Expected Outcomes Short Term: Able to use RPE daily in rehab to express subjective intensity level;Long Term:  Able to use RPE to guide intensity level when exercising independently       Able to understand and use Dyspnea scale Yes       Intervention Provide education and explanation on how to use Dyspnea scale       Expected Outcomes Short Term: Able to use Dyspnea scale daily in rehab to express subjective sense of shortness of breath during exertion;Long Term: Able to use Dyspnea scale to guide intensity level when exercising independently       Knowledge and understanding of Target Heart Rate Range (THRR) Yes       Intervention Provide education and explanation of THRR including how the numbers were predicted and where they are located for reference       Expected Outcomes Short Term: Able to state/look up THRR;Long Term: Able to use THRR to govern intensity when exercising independently;Short Term: Able to use daily as guideline for intensity in rehab       Able to check pulse independently Yes       Intervention Provide education and demonstration on how to check pulse in carotid and radial arteries.;Review the importance of being able to check your own pulse for safety during independent exercise       Expected Outcomes Short Term: Able to explain why pulse checking is important during independent exercise;Long Term: Able to check pulse independently  and accurately       Understanding of Exercise Prescription Yes       Intervention Provide education, explanation, and written materials on patient's individual exercise prescription       Expected Outcomes Short Term: Able to explain program exercise prescription;Long Term: Able to explain home exercise prescription to exercise independently                Exercise Goals Re-Evaluation :  Exercise Goals Re-Evaluation     Row Name 10/30/22 1712 11/04/22 1535           Exercise Goal Re-Evaluation   Exercise Goals  Review Increase Physical Activity;Able to understand and use rate of perceived exertion (RPE) scale;Knowledge and understanding of Target Heart Rate Range (THRR);Understanding of Exercise Prescription;Increase Strength and Stamina;Able to check pulse independently Increase Physical Activity;Increase Strength and Stamina;Understanding of Exercise Prescription      Comments Reviewed RPE scale, THR and program prescription with pt today.  Pt voiced understanding and was given a copy of goals to take home. Fain is off to a good start with rehab for the first couple of sessions he has been here. He was able to walk a full 14 laps on the track! We will continue to monitor as he progresses further in the program.      Expected Outcomes Short: Use RPE daily to regulate intensity.  Long: Follow program prescription in THR. Short: continue to follow initial exercise prescription Long: Continue to build up overall strength and stamina               Discharge Exercise Prescription (Final Exercise Prescription Changes):  Exercise Prescription Changes - 11/04/22 1500       Response to Exercise   Blood Pressure (Admit) 108/70    Blood Pressure (Exercise) 120/72    Blood Pressure (Exit) 106/66    Heart Rate (Admit) 92 bpm    Heart Rate (Exercise) 112 bpm    Heart Rate (Exit) 99 bpm    Rating of Perceived Exertion (Exercise) 15    Symptoms none    Comments 2nd full day of exercise    Duration Progress to 30 minutes of  aerobic without signs/symptoms of physical distress    Intensity THRR unchanged      Progression   Progression Continue to progress workloads to maintain intensity without signs/symptoms of physical distress.    Average METs 2.01      Resistance Training   Training Prescription Yes    Weight 2 lb    Reps 10-15      Interval Training   Interval Training No      NuStep   Level 1    Minutes 15    METs 2      Track   Laps 14    Minutes 15    METs 1.76      Oxygen    Maintain Oxygen Saturation 88% or higher             Nutrition:  Target Goals: Understanding of nutrition guidelines, daily intake of sodium '1500mg'$ , cholesterol '200mg'$ , calories 30% from fat and 7% or less from saturated fats, daily to have 5 or more servings of fruits and vegetables.  Education: All About Nutrition: -Group instruction provided by verbal, written material, interactive activities, discussions, models, and posters to present general guidelines for heart healthy nutrition including fat, fiber, MyPlate, the role of sodium in heart healthy nutrition, utilization of the nutrition label, and utilization of this knowledge for  meal planning. Follow up email sent as well. Written material given at graduation. Flowsheet Row Cardiac Rehab from 10/24/2022 in [facility] Cardiac and Pulmonary Rehab  Education need identified 10/24/22       Biometrics:  Pre Biometrics - 10/24/22 1128       Pre Biometrics   Height 5' 6.75" (1.695 m)    Weight 236 lb 1.6 oz (107.1 kg)    BMI (Calculated) 37.28    Single Leg Stand 0 seconds              Nutrition Therapy Plan and Nutrition Goals:  Nutrition Therapy & Goals - 10/24/22 1117       Intervention Plan   Intervention Prescribe, educate and counsel regarding individualized specific dietary modifications aiming towards targeted core components such as weight, hypertension, lipid management, diabetes, heart failure and other comorbidities.    Expected Outcomes Short Term Goal: Understand basic principles of dietary content, such as calories, fat, sodium, cholesterol and nutrients.;Short Term Goal: A plan has been developed with personal nutrition goals set during dietitian appointment.;Long Term Goal: Adherence to prescribed nutrition plan.             Nutrition Assessments:  MEDIFICTS Score Key: ?70 Need to make dietary changes  40-70 Heart Healthy Diet ? 40 Therapeutic Level Cholesterol Diet  Flowsheet Row Cardiac Rehab from  10/24/2022 in [facility] Cardiac and Pulmonary Rehab  Picture Your Plate Total Score on Admission 51      Picture Your Plate Scores: <50 Unhealthy dietary pattern with much room for improvement. 41-50 Dietary pattern unlikely to meet recommendations for good health and room for improvement. 51-60 More healthful dietary pattern, with some room for improvement.  >60 Healthy dietary pattern, although there may be some specific behaviors that could be improved.    Nutrition Goals Re-Evaluation:   Nutrition Goals Discharge (Final Nutrition Goals Re-Evaluation):   Psychosocial: Target Goals: Acknowledge presence or absence of significant depression and/or stress, maximize coping skills, provide positive support system. Participant is able to verbalize types and ability to use techniques and skills needed for reducing stress and depression.   Education: Stress, Anxiety, and Depression - Group verbal and visual presentation to define topics covered.  Reviews how body is impacted by stress, anxiety, and depression.  Also discusses healthy ways to reduce stress and to treat/manage anxiety and depression.  Written material given at graduation.   Education: Sleep Hygiene -Provides group verbal and written instruction about how sleep can affect your health.  Define sleep hygiene, discuss sleep cycles and impact of sleep habits. Review good sleep hygiene tips.    Initial Review & Psychosocial Screening:  Initial Psych Review & Screening - 10/16/22 1024       Initial Review   Current issues with Current Psychotropic Meds;History of Depression   stress depression while caring for his ailing wife. doing better now     [place]? Yes   new wife, their children, 6 sons,  grandson   Concerns Recent loss of significant other    Comments first wife died after illness,  3 years later remarried.  HAa been married 3 years      Barriers   Psychosocial barriers to participate in  program There are no identifiable barriers or psychosocial needs.      Screening Interventions   Interventions Encouraged to exercise    Expected Outcomes Short Term goal: Utilizing psychosocial counselor, staff and physician to assist with identification of specific Stressors or  current issues interfering with healing process. Setting desired goal for each stressor or current issue identified.;Long Term Goal: Stressors or current issues are controlled or eliminated.;Short Term goal: Identification and review with participant of any Quality of Life or Depression concerns found by scoring the questionnaire.;Long Term goal: The participant improves quality of Life and PHQ9 Scores as seen by post scores and/or verbalization of changes             Quality of Life Scores:   Quality of Life - 10/24/22 1122       Quality of Life   Select Quality of Life      Quality of Life Scores   Health/Function Pre 2037 %    Socioeconomic Pre 27.57 %    Psych/Spiritual Pre 29.14 %    Family Pre 27 %    GLOBAL Pre 24.63 %            Scores of 19 and below usually indicate a poorer quality of life in these areas.  A difference of  2-3 points is a clinically meaningful difference.  A difference of 2-3 points in the total score of the Quality of Life Index has been associated with significant improvement in overall quality of life, self-image, physical symptoms, and general health in studies assessing change in quality of life.  PHQ-9: Review Flowsheet  More data exists      10/24/2022 01/03/2022 07/18/2021 12/28/2020 12/27/2019  Depression screen PHQ 2/9  Decreased Interest 0 0 2 0 0 0  Down, Depressed, Hopeless 1 0 1 0 0 0  PHQ - 2 Score 1 0 3 0 0 0  Altered sleeping 0 - 3 - -  Tired, decreased energy 1 - 3 - -  Change in appetite 1 - 3 - -  Feeling bad or failure about yourself  0 - 0 - -  Trouble concentrating 1 - 0 - -  Moving slowly or fidgety/restless 0 - 0 - -  Suicidal thoughts 0 - 0 - -   PHQ-9 Score 4 - 12 - -  Difficult doing work/chores Somewhat difficult - Not difficult at all - -   Interpretation of Total Score  Total Score Depression Severity:  1-4 = Minimal depression, 5-9 = Mild depression, 10-14 = Moderate depression, 15-19 = Moderately severe depression, 20-27 = Severe depression   Psychosocial Evaluation and Intervention:  Psychosocial Evaluation - 10/16/22 1034       Psychosocial Evaluation & Interventions   Interventions Encouraged to exercise with the program and follow exercise prescription    Comments [place] has no barriers to attending the program. He lives with his new wife (60yr) and they have a dog. He remarried 3 years after his first wife passed away. 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 THis added stress to his life and he started PAxil. He continues to use Paxil. He stated he is feeling less anxious at this time. He has support from his wife and children. One grandson comes to help with chores at times. He is ready to get started and work on improving his shortness of breath symptoms.    Expected Outcomes STG FRaziattends all scheduled sessions. He is able to see decrease of his shortness of breath symptoms. He continues to have less depression/anxiety symptoms LTG FJakelcontinues withhis exercise progeression and decrease of his shortness of breath symptoms. He has less depression/anxiety symptoms after discharge.    Continue Psychosocial Services  Follow  up required by staff             Psychosocial Re-Evaluation:   Psychosocial Discharge (Final Psychosocial Re-Evaluation):   Vocational Rehabilitation: Provide vocational rehab assistance to qualifying candidates.   Vocational Rehab Evaluation & Intervention:   Education: Education Goals: Education classes will be provided on a variety of topics geared toward better understanding of heart health and risk factor modification. Participant will state  understanding/return demonstration of topics presented as noted by education test scores.  Learning Barriers/Preferences:  Learning Barriers/Preferences - 10/16/22 1028       Learning Barriers/Preferences   Learning Barriers None    Learning Preferences None             General Cardiac Education Topics:  AED/CPR: - Group verbal and written instruction with the use of models to demonstrate the basic use of the AED with the basic ABC's of resuscitation.   Anatomy and Cardiac Procedures: - Group verbal and visual presentation and models provide information about basic cardiac anatomy and function. Reviews the testing methods done to diagnose heart disease and the outcomes of the test results. Describes the treatment choices: Medical Management, Angioplasty, or Coronary Bypass Surgery for treating various heart conditions including Myocardial Infarction, Angina, Valve Disease, and Cardiac Arrhythmias.  Written material given at graduation. Flowsheet Row Cardiac Rehab from 10/24/2022 in [facility] Cardiac and Pulmonary Rehab  Education need identified 10/24/22       Medication Safety: - Group verbal and visual instruction to review commonly prescribed medications for heart and lung disease. Reviews the medication, class of the drug, and side effects. Includes the steps to properly store meds and maintain the prescription regimen.  Written material given at graduation.   Intimacy: - Group verbal instruction through game format to discuss how heart and lung disease can affect sexual intimacy. Written material given at graduation..   Know Your Numbers and Heart Failure: - Group verbal and visual instruction to discuss disease risk factors for cardiac and pulmonary disease and treatment options.  Reviews associated critical values for Overweight/Obesity, Hypertension, Cholesterol, and Diabetes.  Discusses basics of heart failure: signs/symptoms and treatments.  Introduces Heart Failure Zone  chart for action plan for heart failure.  Written material given at graduation.   Infection Prevention: - Provides verbal and written material to individual with discussion of infection control including proper hand washing and proper equipment cleaning during exercise session. Flowsheet Row Cardiac Rehab from 10/24/2022 in [facility] Cardiac and Pulmonary Rehab  Education need identified 10/24/22  Date 10/24/22  Educator [place]  Instruction Review Code 1- Verbalizes Understanding       Falls Prevention: - Provides verbal and written material to individual with discussion of falls prevention and safety. Flowsheet Row Cardiac Rehab from 10/16/2022 in [facility] Cardiac [facility] Pul[facility] Rehab  Date 10/16/22  Educator [place]  Instruction Review Code 1- Verbalizes Understanding       Other: -Provides group and verbal instruction on various topics (see comments)   Knowledge Questionnaire Score:  Knowledge Questionnaire Score - 10/24/22 1114       Knowledge Questionnaire Score   Pre Score 22/26             Core Components/Risk Factors/Patient Goals at Admission:  Personal Goals and Risk Factors at Admission - 10/24/22 1142       Core Components/Risk Factors/Patient Goals on Admission    Weight Management Yes;Weight Loss;Obesity    Intervention Weight Management: Develop a combined nutrition and exercise program designed to reach desired caloric  intake, while maintaining appropriate intake of nutrient and fiber, sodium and fats, and appropriate energy expenditure required for the weight goal.;Weight Management/Obesity: Establish reasonable short term and long term weight goals.;Weight Management: Provide education and appropriate resources to help participant work on and attain dietary goals.    Admit Weight 236 lb (107 kg)    Goal Weight: Short Term 230 lb (104.3 kg)    Goal Weight: Long Term 200 lb (90.7 kg)    Expected Outcomes Short Term: Continue to assess and modify interventions until  short term weight is achieved;Long Term: Adherence to nutrition and physical activity/exercise program aimed toward attainment of established weight goal;Weight Loss: Understanding of general recommendations for a balanced deficit meal plan, which promotes 1-2 lb weight loss per week and includes a negative energy balance of [id number] kcal/d;Understanding recommendations for meals to include 15-35% energy as protein, 25-35% energy from fat, 35-60% energy from carbohydrates, less than '200mg'$  of dietary cholesterol, 20-35 gm of total fiber daily;Understanding of distribution of calorie intake throughout the day with the consumption of 4-5 meals/snacks    Diabetes Yes    Intervention Provide education about signs/symptoms and action to take for hypo/hyperglycemia.;Provide education about proper nutrition, including hydration, and aerobic/resistive exercise prescription along with prescribed medications to achieve blood glucose in normal ranges: Fasting glucose 65-99 mg/dL    Expected Outcomes Short Term: Participant verbalizes understanding of the signs/symptoms and immediate care of hyper/hypoglycemia, proper foot care and importance of medication, aerobic/resistive exercise and nutrition plan for blood glucose control.;Long Term: Attainment of HbA1C < 7%.    Heart Failure Yes    Intervention Provide a combined exercise and nutrition program that is supplemented with education, support and counseling about heart failure. Directed toward relieving symptoms such as shortness of breath, decreased exercise tolerance, and extremity edema.    Expected Outcomes Improve functional capacity of life;Short term: Attendance in program 2-3 days a week with increased exercise capacity. Reported lower sodium intake. Reported increased fruit and vegetable intake. Reports medication compliance.;Short term: Daily weights obtained and reported for increase. Utilizing diuretic protocols set by physician.;Long term: Adoption of  self-care skills and reduction of barriers for early signs and symptoms recognition and intervention leading to self-care maintenance.    Hypertension Yes    Intervention Provide education on lifestyle modifcations including regular physical activity/exercise, weight management, moderate sodium restriction and increased consumption of fresh fruit, vegetables, and low fat dairy, alcohol moderation, and smoking cessation.;Monitor prescription use compliance.    Expected Outcomes Short Term: Continued assessment and intervention until BP is < 140/85m HG in hypertensive participants. < 130/842mHG in hypertensive participants with diabetes, heart failure or chronic kidney disease.;Long Term: Maintenance of blood pressure at goal levels.    Lipids Yes    Intervention Provide education and support for participant on nutrition & aerobic/resistive exercise along with prescribed medications to achieve LDL '70mg'$ , HDL >'40mg'$ .    Expected Outcomes Short Term: Participant states understanding of desired cholesterol values and is compliant with medications prescribed. Participant is following exercise prescription and nutrition guidelines.;Long Term: Cholesterol controlled with medications as prescribed, with individualized exercise RX and with personalized nutrition plan. Value goals: LDL < '70mg'$ , HDL > 40 mg.             Education:Diabetes - Individual verbal and written instruction to review signs/symptoms of diabetes, desired ranges of glucose level fasting, after meals and with exercise. Acknowledge that pre and post exercise glucose checks will be done for 3 sessions at entry of  program. Flowsheet Row Cardiac Rehab from 10/24/2022 in [facility] Cardiac and Pulmonary Rehab  Education need identified 10/24/22  Date 10/24/22  Educator [place]  Instruction Review Code 1- Verbalizes Understanding       Core Components/Risk Factors/Patient Goals Review:    Core Components/Risk Factors/Patient Goals at Discharge  (Final Review):    ITP Comments:  ITP Comments     Row Name 10/16/22 1004 10/24/22 1113 10/30/22 1711 11/06/22 0750     ITP Comments Virtual orientation call completed today. he has an appointment on Date: 10/24/2022  for EP eval and gym Orientation.  Documentation of diagnosis can be found in [facility] Date: 09/08/2022 . Completed 6MWT and gym orientation. Initial ITP created and sent for review to Dr. Emily Filbert, Medical Director. First full day of exercise!  Patient was oriented to gym and equipment including functions, settings, policies, and procedures.  Patient's individual exercise prescription and treatment plan were reviewed.  All starting workloads were established based on the results of the 6 minute walk test done at initial orientation visit.  The plan for exercise progression was also introduced and progression will be customized based on patient's performance and goals. 30 Day review completed. Medical Director ITP review done, changes made as directed, and signed approval by Medical Director.   New to program             Comments:

## 2022-11-07 ENCOUNTER — Ambulatory Visit: Payer: Self-pay | Admitting: *Deleted

## 2022-11-07 NOTE — Telephone Encounter (Signed)
  Chief Complaint: wheezing, cough Symptoms: post surgical in October, audible wheezing, congested cough, SOB with exertion and sometimes sitting Frequency:  1 week Pertinent Negatives: Patient denies  chest pain, swelling in extremities Disposition: '[x]'$ ED /'[]'$ Urgent Care (no appt availability in office) / '[]'$ Appointment(In office/virtual)/ '[]'$  [facility]/ '[]'$ Home Care/ '[]'$ Refused Recommended Disposition /'[]'$ [facility]/ '[]'$  Follow-up with PCP Additional Notes: Advised ED- patient states he goes to [place] in [place]- just wants to come into the office- advised ED- will send message to office.

## 2022-11-07 NOTE — Telephone Encounter (Signed)
Please advise 

## 2022-11-07 NOTE — Telephone Encounter (Signed)
Reason for Disposition  Wheezing can be heard across the room  Answer Assessment - Initial Assessment Questions 1. RESPIRATORY STATUS: "Describe your breathing?" (e.g., wheezing, shortness of breath, unable to speak, severe coughing)      Wheezing, cough 2. ONSET: "When did this breathing problem begin?"      5-6 days 3. PATTERN "Does the difficult breathing come and go, or has it been constant since it started?"      Comes and goes 4. SEVERITY: "How bad is your breathing?" (e.g., mild, moderate, severe)    - MILD: No SOB at rest, mild SOB with walking, speaks normally in sentences, can lie down, no retractions, pulse < 100.    - MODERATE: SOB at rest, SOB with minimal exertion and prefers to sit, cannot lie down flat, speaks in phrases, mild retractions, audible wheezing, pulse 100-120.    - SEVERE: Very SOB at rest, speaks in single words, struggling to breathe, sitting hunched forward, retractions, pulse > 120      Mild/moderate 5. RECURRENT SYMPTOM: "Have you had difficulty breathing before?" If Yes, ask: "When was the last time?" and "What happened that time?"      No- patient does have hx sinus infection, bronchitis 6. CARDIAC HISTORY: "Do you have any history of heart disease?" (e.g., heart attack, angina, bypass surgery, angioplasty)      Heart surgery- October  7. LUNG HISTORY: "Do you have any history of lung disease?"  (e.g., pulmonary embolus, asthma, emphysema)     no 8. CAUSE: "What do you think is causing the breathing problem?"      Sinus congestion 9. OTHER SYMPTOMS: "Do you have any other symptoms? (e.g., dizziness, runny nose, cough, chest pain, fever)     no  Protocols used: Breathing Difficulty-A-AH

## 2022-11-07 NOTE — Telephone Encounter (Signed)
Spoke with patient. States he took some medicine his wife gave him and he feels better but [facility].

## 2022-11-11 ENCOUNTER — Encounter: Payer: No Typology Code available for payment source | Attending: Family Medicine | Admitting: *Deleted

## 2022-11-11 DIAGNOSIS — Z952 Presence of prosthetic heart valve: Secondary | ICD-10-CM | POA: Insufficient documentation

## 2022-11-13 ENCOUNTER — Telehealth: Payer: Self-pay

## 2022-11-13 NOTE — Telephone Encounter (Signed)
Called patient, hasn't attended cardiac rehab in the last week and a half. Patient states he got sick and is now in the hospital with fluid overload? Unable to see hospital notes at this time. Patient will call us after he gets discharged and will determine a return date/ possible clearance pending hospital visit diagnosis.

## 2022-11-20 ENCOUNTER — Telehealth: Payer: Self-pay | Admitting: *Deleted

## 2022-11-20 ENCOUNTER — Encounter: Payer: Self-pay | Admitting: *Deleted

## 2022-11-20 DIAGNOSIS — Z952 Presence of prosthetic heart valve: Secondary | ICD-10-CM

## 2022-11-20 NOTE — Telephone Encounter (Signed)
Called patient to check in and receive any updates. Left voicemail asking for callback.

## 2022-11-20 NOTE — Telephone Encounter (Signed)
Sean Hayden called to let us know that he was admitted to [facility] hospital for a bad cold.  He will remain out until he is feeling better.

## 2022-11-25 ENCOUNTER — Ambulatory Visit: Payer: Medicare HMO

## 2022-11-26 ENCOUNTER — Telehealth: Payer: Self-pay

## 2022-11-26 NOTE — Telephone Encounter (Signed)
Patient called to let us know he is discharged from the hospital - had Rhinovirus. But still feeling some sickness and not up to his baseline. Patient will call us next week to let us know how he is doing.

## 2022-11-27 ENCOUNTER — Ambulatory Visit: Payer: Medicare HMO

## 2022-12-03 ENCOUNTER — Other Ambulatory Visit: Payer: Self-pay | Admitting: Family Medicine

## 2022-12-03 DIAGNOSIS — I480 Paroxysmal atrial fibrillation: Secondary | ICD-10-CM

## 2022-12-03 DIAGNOSIS — F439 Reaction to severe stress, unspecified: Secondary | ICD-10-CM

## 2022-12-04 ENCOUNTER — Telehealth: Payer: Self-pay | Admitting: Family Medicine

## 2022-12-04 ENCOUNTER — Encounter: Payer: Self-pay | Admitting: *Deleted

## 2022-12-04 ENCOUNTER — Ambulatory Visit: Payer: Medicare HMO

## 2022-12-04 DIAGNOSIS — R609 Edema, unspecified: Secondary | ICD-10-CM

## 2022-12-04 DIAGNOSIS — Z952 Presence of prosthetic heart valve: Secondary | ICD-10-CM

## 2022-12-04 NOTE — Progress Notes (Signed)
Cardiac Individual Treatment Plan  Patient Details  Name: Sean Hayden MRN: [id number] Date of Birth: 1946/11/08 Referring Provider:   Flowsheet Row Cardiac Rehab from 10/24/2022 in [facility] Cardiac and Pulmonary Rehab  Referring Provider Lelon Huh MD       Initial Encounter Date:  Flowsheet Row Cardiac Rehab from 10/24/2022 in [facility] Cardiac and Pulmonary Rehab  Date 10/24/22       Visit Diagnosis: S/P TAVR (transcatheter aortic valve replacement)  Patient's Home Medications on Admission:  Current Outpatient Medications:    acyclovir (ZOVIRAX) 400 MG tablet, 1/2 tablet up to 5 times a day for cold sores, Disp: 30 tablet, Rfl: 5   albuterol (VENTOLIN HFA) 108 (90 Base) MCG/ACT inhaler, Inhale 2 puffs into the lungs every 6 (six) hours as needed for wheezing or shortness of breath., Disp: , Rfl:    atorvastatin (LIPITOR) 20 MG tablet, Take 1 tablet (20 mg total) by mouth every evening. (Patient taking differently: Take 40 mg by mouth every evening.), Disp: 90 tablet, Rfl: 3   benzocaine (ORAJEL) 10 % mucosal gel, Use as directed 1 application in the mouth or throat as needed for mouth pain., Disp: , Rfl:    cephALEXin (KEFLEX) 500 MG capsule, TAKE ONE CAPSULE BY MOUTH FOUR TIMES A DAY FOR BACTERIAL INFECTION, Disp: , Rfl:    Cholecalciferol (VITAMIN D PO), Take 2,000 Units by mouth daily. , Disp: , Rfl:    Docosanol 10 % CREA, Apply topically up to 5 times per day as needed until heals, Disp: 2 g, Rfl: 3   empagliflozin (JARDIANCE) 25 MG TABS tablet, Take 25 mg by mouth daily., Disp: 90 tablet, Rfl: 4   Flaxseed, Linseed, (FLAXSEED OIL) 1000 MG CAPS, Take 1,000 mg by mouth 2 (two) times daily., Disp: , Rfl:    fluticasone (FLONASE) 50 MCG/ACT nasal spray, Place 2 sprays into both nostrils daily. (Patient taking differently: Place 2 sprays into both nostrils 2 (two) times daily.), Disp: 16 g, Rfl: 1   furosemide (LASIX) 20 MG tablet, Take 20 mg by mouth daily. Take 1 tablet (20 mg  total) by mouth once daily as needed for Edema (Take 1 tablet daily as needed for weight gain >2 lbs in 24 h), Disp: , Rfl:    gabapentin (NEURONTIN) 100 MG capsule, Take 1 capsule (100 mg total) by mouth 2 (two) times daily., Disp: 180 capsule, Rfl: 1   insulin detemir (LEVEMIR) 100 UNIT/ML injection, Inject 0.3 mLs (30 Units total) into the skin 2 (two) times daily. (Patient taking differently: Inject 10 Units into the skin 2 (two) times daily.), Disp: 100 mL, Rfl: 1   levETIRAcetam (KEPPRA) 750 MG tablet, Take 2 tablets (1,500 mg total) by mouth 2 (two) times daily. 3 in the am and 3 at bedtime (Patient taking differently: Take 1,500 mg by mouth 2 (two) times daily.), Disp: 360 tablet, Rfl: 4   lisinopril (ZESTRIL) 2.5 MG tablet, Take 5 mg by mouth daily., Disp: , Rfl:    loratadine (CLARITIN) 10 MG tablet, Take 10 mg by mouth daily. , Disp: , Rfl:    metFORMIN (GLUCOPHAGE) 850 MG tablet, Take 1 tablet (850 mg total) by mouth 2 (two) times daily with a meal., Disp: 180 tablet, Rfl: 1   metoprolol tartrate (LOPRESSOR) 25 MG tablet, Take 0.5 tablets by mouth every 12 (twelve) hours., Disp: , Rfl:    Misc Natural Products (OSTEO BI-FLEX TRIPLE STRENGTH PO), Take 1 tablet by mouth 2 (two) times daily. , Disp: , Rfl:  MISC NATURAL PRODUCTS PO, Take 1 capsule by mouth in the morning and at bedtime. Super Beta Prostate, Disp: , Rfl:    montelukast (SINGULAIR) 10 MG tablet, TAKE ONE TABLET EVERY DAY FOR CHRONIC COUGH, Disp: 30 tablet, Rfl: 12   Omega-3 Fatty Acids (FISH OIL) 1000 MG CPDR, Take 1,200 mg by mouth 2 (two) times daily. , Disp: , Rfl:    PARoxetine (PAXIL) 40 MG tablet, TAKE 1 TABLET BY MOUTH DAILY., Disp: 30 tablet, Rfl: 0   rivaroxaban (XARELTO) 20 MG TABS tablet, Take 1 tablet (20 mg total) by mouth daily with supper., Disp: 10 tablet, Rfl: 0   tamsulosin (FLOMAX) 0.4 MG CAPS capsule, Take 1 capsule by mouth daily., Disp: , Rfl:    zolpidem (AMBIEN) 10 MG tablet, TAKE 1/2-1 TABLET BY MOUTH  AT BEDTIME. (Patient not taking: Reported on 10/16/2022), Disp: 90 tablet, Rfl: 3  Past Medical History: Past Medical History:  Diagnosis Date   Aortic stenosis    moderate by 02/2016 echo   Aortic valve stenosis, congenital    Moderate by echo 2017   B12 deficiency    Cancer (HCC)    skin cancer - face    CVA (cerebral vascular accident) ([place])    02/2016 no residual   Diabetes mellitus without complication ([place])    Gout    Hemorrhoids    History of kidney stones    Hyperlipidemia    Hyperplastic colon polyp    Hypertension    MRSA infection    Neuropathy    PAF (paroxysmal atrial fibrillation) ([place])    Personal history of urinary calculi 05/25/2015   Pneumonia    1990's   Seizures ([place])     Tobacco Use: Social History   Tobacco Use  Smoking Status Former   Types: Cigars   Quit date: 04/08/2012   Years since quitting: 10.6  Smokeless Tobacco Never  Tobacco Comments   cigarettes and cigars  all quit 12 years ago    Labs: Review Flowsheet  More data exists      Latest Ref Rng & Units 02/07/2020 05/16/2020 07/18/2021 01/25/2022 05/20/2022  Labs for ITP Cardiac and Pulmonary Rehab  Cholestrol 100 - 199 mg/dL 123     - 122  111  -  LDL (calc) 0 - 99 mg/dL 49     - 51  45  -  HDL-C >39 mg/dL 37     - 34  33  -  Trlycerides 0 - 149 mg/dL 184     - 231  200  -  Hemoglobin A1c 4.0 - 5.6 % 7.7     7.6  8.9  8.5  7.6     Details       This result is from an external source.          Exercise Target Goals: Exercise Program Goal: Individual exercise prescription set using results from initial 6 min walk test and THRR while considering  patient's activity barriers and safety.   Exercise Prescription Goal: Initial exercise prescription builds to 30-45 minutes a day of aerobic activity, 2-3 days per week.  Home exercise guidelines will be given to patient during program as part of exercise prescription that the participant will acknowledge.   Education: Aerobic  Exercise: - Group verbal and visual presentation on the components of exercise prescription. Introduces F.I.T.T principle from ACSM for exercise prescriptions.  Reviews F.I.T.T. principles of aerobic exercise including progression. Written material given at graduation. Flowsheet Row Cardiac Rehab from 10/24/2022  in [facility] Cardiac and Pulmonary Rehab  Education need identified 10/24/22       Education: Resistance Exercise: - Group verbal and visual presentation on the components of exercise prescription. Introduces F.I.T.T principle from ACSM for exercise prescriptions  Reviews F.I.T.T. principles of resistance exercise including progression. Written material given at graduation.    Education: Exercise & Equipment Safety: - Individual verbal instruction and demonstration of equipment use and safety with use of the equipment. Flowsheet Row Cardiac Rehab from 10/24/2022 in [facility] Cardiac and Pulmonary Rehab  Education need identified 10/24/22  Date 10/24/22  Educator [place]  Instruction Review Code 1- Verbalizes Understanding       Education: Exercise Physiology & General Exercise Guidelines: - Group verbal and written instruction with models to review the exercise physiology of the cardiovascular system and associated critical values. Provides general exercise guidelines with specific guidelines to those with heart or lung disease.  Flowsheet Row Cardiac Rehab from 10/24/2022 in [facility] Cardiac and Pulmonary Rehab  Education need identified 10/24/22       Education: Flexibility, Balance, Mind/Body Relaxation: - Group verbal and visual presentation with interactive activity on the components of exercise prescription. Introduces F.I.T.T principle from ACSM for exercise prescriptions. Reviews F.I.T.T. principles of flexibility and balance exercise training including progression. Also discusses the mind body connection.  Reviews various relaxation techniques to help reduce and manage stress (i.e. Deep  breathing, progressive muscle relaxation, and visualization). Balance handout provided to take home. Written material given at graduation.   Activity Barriers & Risk Stratification:  Activity Barriers & Cardiac Risk Stratification - 10/24/22 1123       Activity Barriers & Cardiac Risk Stratification   Activity Barriers Deconditioning;Muscular Weakness;Shortness of Breath;Arthritis;Other (comment);Decreased Ventricular Function    Comments Seizures, resulting in hand numbness    Cardiac Risk Stratification High             6 Minute Walk:  6 Minute Walk     Row Name 10/24/22 1128         6 Minute Walk   Phase Initial     Distance 550 feet     Walk Time 6 minutes     # of Rest Breaks 0     MPH 1.04     METS 0.84     RPE 16     Perceived Dyspnea  1     VO2 Peak 2.96     Symptoms Yes (comment)     Comments Fatigue     Resting HR 90 bpm     Resting BP 118/72     Resting Oxygen Saturation  94 %     Exercise Oxygen Saturation  during 6 min walk 90 %     Max Ex. HR 120 bpm     Max Ex. BP 126/68     2 Minute Post BP 120/74              Oxygen Initial Assessment:   Oxygen Re-Evaluation:   Oxygen Discharge (Final Oxygen Re-Evaluation):   Initial Exercise Prescription:  Initial Exercise Prescription - 10/24/22 1100       Date of Initial Exercise RX and Referring Provider   Date 10/24/22    Referring Provider Lelon Huh MD      Oxygen   Maintain Oxygen Saturation 88% or higher      Recumbant Bike   Level 1    RPM 60    Watts 10    Minutes 15    METs 1  NuStep   Level 1    SPM 80    Minutes 15    METs 1      T5 Nustep   Level 1    SPM 80    Minutes 15    METs 1      Track   Laps 8    Minutes 15    METs 1.44      Prescription Details   Frequency (times per week) 2    Duration Progress to 30 minutes of continuous aerobic without signs/symptoms of physical distress      Intensity   THRR 40-80% of Max Heartrate 111 - 133     Ratings of Perceived Exertion 11-13    Perceived Dyspnea 0-4      Progression   Progression Continue to progress workloads to maintain intensity without signs/symptoms of physical distress.      Resistance Training   Training Prescription Yes    Weight 2 lb    Reps 10-15             Perform Capillary Blood Glucose checks as needed.  Exercise Prescription Changes:   Exercise Prescription Changes     Row Name 10/24/22 1100 11/04/22 1500           Response to Exercise   Blood Pressure (Admit) 118/72 108/70      Blood Pressure (Exercise) 126/68 120/72      Blood Pressure (Exit) 120/74 106/66      Heart Rate (Admit) 90 bpm 92 bpm      Heart Rate (Exercise) 120 bpm 112 bpm      Heart Rate (Exit) 87 bpm 99 bpm      Oxygen Saturation (Admit) 94 % --      Oxygen Saturation (Exercise) 90 % --      Oxygen Saturation (Exit) 94 % --      Rating of Perceived Exertion (Exercise) 16 15      Perceived Dyspnea (Exercise) 1 --      Symptoms Fatigue none      Comments walk test results 2nd full day of exercise      Duration -- Progress to 30 minutes of  aerobic without signs/symptoms of physical distress      Intensity -- THRR unchanged        Progression   Progression -- Continue to progress workloads to maintain intensity without signs/symptoms of physical distress.      Average METs -- 2.01        Resistance Training   Training Prescription -- Yes      Weight -- 2 lb      Reps -- 10-15        Interval Training   Interval Training -- No        NuStep   Level -- 1      Minutes -- 15      METs -- 2        Track   Laps -- 14      Minutes -- 15      METs -- 1.76        Oxygen   Maintain Oxygen Saturation -- 88% or higher               Exercise Comments:   Exercise Comments     Row Name 10/30/22 1711           Exercise Comments First full day of exercise!  Patient was oriented to gym and equipment including functions,  settings, policies, and procedures.   Patient's individual exercise prescription and treatment plan were reviewed.  All starting workloads were established based on the results of the 6 minute walk test done at initial orientation visit.  The plan for exercise progression was also introduced and progression will be customized based on patient's performance and goals.                Exercise Goals and Review:   Exercise Goals     Row Name 10/24/22 1141             Exercise Goals   Increase Physical Activity Yes       Intervention Provide advice, education, support and counseling about physical activity/exercise needs.;Develop an individualized exercise prescription for aerobic and resistive training based on initial evaluation findings, risk stratification, comorbidities and participant's personal goals.       Expected Outcomes Short Term: Attend rehab on a regular basis to increase amount of physical activity.;Long Term: Add in home exercise to make exercise part of routine and to increase amount of physical activity.;Long Term: Exercising regularly at least 3-5 days a week.       Increase Strength and Stamina Yes       Intervention Provide advice, education, support and counseling about physical activity/exercise needs.;Develop an individualized exercise prescription for aerobic and resistive training based on initial evaluation findings, risk stratification, comorbidities and participant's personal goals.       Expected Outcomes Short Term: Increase workloads from initial exercise prescription for resistance, speed, and METs.;Short Term: Perform resistance training exercises routinely during rehab and add in resistance training at home;Long Term: Improve cardiorespiratory fitness, muscular endurance and strength as measured by increased METs and functional capacity (6MWT)       Able to understand and use rate of perceived exertion (RPE) scale Yes       Intervention Provide education and explanation on how to use RPE scale        Expected Outcomes Short Term: Able to use RPE daily in rehab to express subjective intensity level;Long Term:  Able to use RPE to guide intensity level when exercising independently       Able to understand and use Dyspnea scale Yes       Intervention Provide education and explanation on how to use Dyspnea scale       Expected Outcomes Short Term: Able to use Dyspnea scale daily in rehab to express subjective sense of shortness of breath during exertion;Long Term: Able to use Dyspnea scale to guide intensity level when exercising independently       Knowledge and understanding of Target Heart Rate Range (THRR) Yes       Intervention Provide education and explanation of THRR including how the numbers were predicted and where they are located for reference       Expected Outcomes Short Term: Able to state/look up THRR;Long Term: Able to use THRR to govern intensity when exercising independently;Short Term: Able to use daily as guideline for intensity in rehab       Able to check pulse independently Yes       Intervention Provide education and demonstration on how to check pulse in carotid and radial arteries.;Review the importance of being able to check your own pulse for safety during independent exercise       Expected Outcomes Short Term: Able to explain why pulse checking is important during independent exercise;Long Term: Able to check pulse independently and accurately  Understanding of Exercise Prescription Yes       Intervention Provide education, explanation, and written materials on patient's individual exercise prescription       Expected Outcomes Short Term: Able to explain program exercise prescription;Long Term: Able to explain home exercise prescription to exercise independently                Exercise Goals Re-Evaluation :  Exercise Goals Re-Evaluation     Row Name 10/30/22 1712 11/04/22 1535 11/21/22 1423         Exercise Goal Re-Evaluation   Exercise Goals Review  Increase Physical Activity;Able to understand and use rate of perceived exertion (RPE) scale;Knowledge and understanding of Target Heart Rate Range (THRR);Understanding of Exercise Prescription;Increase Strength and Stamina;Able to check pulse independently Increase Physical Activity;Increase Strength and Stamina;Understanding of Exercise Prescription Increase Physical Activity;Increase Strength and Stamina;Understanding of Exercise Prescription     Comments Reviewed RPE scale, THR and program prescription with pt today.  Pt voiced understanding and was given a copy of goals to take home. Rajan is off to a good start with rehab for the first couple of sessions he has been here. He was able to walk a full 14 laps on the track! We will continue to monitor as he progresses further in the program. Kaliq has not attended rehab since the last review. We will reach out to him and remind him of the importance of attending rehab consistently for the greatest benefit. We will continue to monitor when he returns.     Expected Outcomes Short: Use RPE daily to regulate intensity.  Long: Follow program prescription in THR. Short: continue to follow initial exercise prescription Long: Continue to build up overall strength and stamina Short: Return to regular attendance. Long: Continue to increase strength and stamina.              Discharge Exercise Prescription (Final Exercise Prescription Changes):  Exercise Prescription Changes - 11/04/22 1500       Response to Exercise   Blood Pressure (Admit) 108/70    Blood Pressure (Exercise) 120/72    Blood Pressure (Exit) 106/66    Heart Rate (Admit) 92 bpm    Heart Rate (Exercise) 112 bpm    Heart Rate (Exit) 99 bpm    Rating of Perceived Exertion (Exercise) 15    Symptoms none    Comments 2nd full day of exercise    Duration Progress to 30 minutes of  aerobic without signs/symptoms of physical distress    Intensity THRR unchanged      Progression    Progression Continue to progress workloads to maintain intensity without signs/symptoms of physical distress.    Average METs 2.01      Resistance Training   Training Prescription Yes    Weight 2 lb    Reps 10-15      Interval Training   Interval Training No      NuStep   Level 1    Minutes 15    METs 2      Track   Laps 14    Minutes 15    METs 1.76      Oxygen   Maintain Oxygen Saturation 88% or higher             Nutrition:  Target Goals: Understanding of nutrition guidelines, daily intake of sodium '1500mg'$ , cholesterol '200mg'$ , calories 30% from fat and 7% or less from saturated fats, daily to have 5 or more servings of fruits and vegetables.  Education:  All About Nutrition: -Group instruction provided by verbal, written material, interactive activities, discussions, models, and posters to present general guidelines for heart healthy nutrition including fat, fiber, MyPlate, the role of sodium in heart healthy nutrition, utilization of the nutrition label, and utilization of this knowledge for meal planning. Follow up email sent as well. Written material given at graduation. Flowsheet Row Cardiac Rehab from 10/24/2022 in [facility] Cardiac and Pulmonary Rehab  Education need identified 10/24/22       Biometrics:  Pre Biometrics - 10/24/22 1128       Pre Biometrics   Height 5' 6.75" (1.695 m)    Weight 236 lb 1.6 oz (107.1 kg)    BMI (Calculated) 37.28    Single Leg Stand 0 seconds              Nutrition Therapy Plan and Nutrition Goals:  Nutrition Therapy & Goals - 10/24/22 1117       Intervention Plan   Intervention Prescribe, educate and counsel regarding individualized specific dietary modifications aiming towards targeted core components such as weight, hypertension, lipid management, diabetes, heart failure and other comorbidities.    Expected Outcomes Short Term Goal: Understand basic principles of dietary content, such as calories, fat, sodium,  cholesterol and nutrients.;Short Term Goal: A plan has been developed with personal nutrition goals set during dietitian appointment.;Long Term Goal: Adherence to prescribed nutrition plan.             Nutrition Assessments:  MEDIFICTS Score Key: ?70 Need to make dietary changes  40-70 Heart Healthy Diet ? 40 Therapeutic Level Cholesterol Diet  Flowsheet Row Cardiac Rehab from 10/24/2022 in [facility] Cardiac and Pulmonary Rehab  Picture Your Plate Total Score on Admission 51      Picture Your Plate Scores: <61 Unhealthy dietary pattern with much room for improvement. 41-50 Dietary pattern unlikely to meet recommendations for good health and room for improvement. 51-60 More healthful dietary pattern, with some room for improvement.  >60 Healthy dietary pattern, although there may be some specific behaviors that could be improved.    Nutrition Goals Re-Evaluation:   Nutrition Goals Discharge (Final Nutrition Goals Re-Evaluation):   Psychosocial: Target Goals: Acknowledge presence or absence of significant depression and/or stress, maximize coping skills, provide positive support system. Participant is able to verbalize types and ability to use techniques and skills needed for reducing stress and depression.   Education: Stress, Anxiety, and Depression - Group verbal and visual presentation to define topics covered.  Reviews how body is impacted by stress, anxiety, and depression.  Also discusses healthy ways to reduce stress and to treat/manage anxiety and depression.  Written material given at graduation.   Education: Sleep Hygiene -Provides group verbal and written instruction about how sleep can affect your health.  Define sleep hygiene, discuss sleep cycles and impact of sleep habits. Review good sleep hygiene tips.    Initial Review & Psychosocial Screening:  Initial Psych Review & Screening - 10/16/22 1024       Initial Review   Current issues with Current Psychotropic  Meds;History of Depression   stress depression while caring for his ailing wife. doing better now     [place]? Yes   new wife, their children, 6 sons,  grandson   Concerns Recent loss of significant other    Comments first wife died after illness,  3 years later remarried.  HAa been married 3 years      Barriers   Psychosocial barriers  to participate in program There are no identifiable barriers or psychosocial needs.      Screening Interventions   Interventions Encouraged to exercise    Expected Outcomes Short Term goal: Utilizing psychosocial counselor, staff and physician to assist with identification of specific Stressors or current issues interfering with healing process. Setting desired goal for each stressor or current issue identified.;Long Term Goal: Stressors or current issues are controlled or eliminated.;Short Term goal: Identification and review with participant of any Quality of Life or Depression concerns found by scoring the questionnaire.;Long Term goal: The participant improves quality of Life and PHQ9 Scores as seen by post scores and/or verbalization of changes             Quality of Life Scores:   Quality of Life - 10/24/22 1122       Quality of Life   Select Quality of Life      Quality of Life Scores   Health/Function Pre 2037 %    Socioeconomic Pre 27.57 %    Psych/Spiritual Pre 29.14 %    Family Pre 27 %    GLOBAL Pre 24.63 %            Scores of 19 and below usually indicate a poorer quality of life in these areas.  A difference of  2-3 points is a clinically meaningful difference.  A difference of 2-3 points in the total score of the Quality of Life Index has been associated with significant improvement in overall quality of life, self-image, physical symptoms, and general health in studies assessing change in quality of life.  PHQ-9: Review Flowsheet  More data exists      10/24/2022 01/03/2022 07/18/2021 12/28/2020  12/27/2019  Depression screen PHQ 2/9  Decreased Interest 0 0 2 0 0 0  Down, Depressed, Hopeless 1 0 1 0 0 0  PHQ - 2 Score 1 0 3 0 0 0  Altered sleeping 0 - 3 - -  Tired, decreased energy 1 - 3 - -  Change in appetite 1 - 3 - -  Feeling bad or failure about yourself  0 - 0 - -  Trouble concentrating 1 - 0 - -  Moving slowly or fidgety/restless 0 - 0 - -  Suicidal thoughts 0 - 0 - -  PHQ-9 Score 4 - 12 - -  Difficult doing work/chores Somewhat difficult - Not difficult at all - -   Interpretation of Total Score  Total Score Depression Severity:  1-4 = Minimal depression, 5-9 = Mild depression, 10-14 = Moderate depression, 15-19 = Moderately severe depression, 20-27 = Severe depression   Psychosocial Evaluation and Intervention:  Psychosocial Evaluation - 10/16/22 1034       Psychosocial Evaluation & Interventions   Interventions Encouraged to exercise with the program and follow exercise prescription    Comments Kuron has no barriers to attending the program. He lives with his new wife (65yr) and they have a dog. He remarried 3 years after his first wife passed away. 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 THis added stress to his life and he started PAxil. He continues to use Paxil. He stated he is feeling less anxious at this time. He has support from his wife and children. One grandson comes to help with chores at times. He is ready to get started and work on improving his shortness of breath symptoms.    Expected Outcomes STG FAlhassanattends all scheduled sessions. He  is able to see decrease of his shortness of breath symptoms. He continues to have less depression/anxiety symptoms LTG Romyn continues withhis exercise progeression and decrease of his shortness of breath symptoms. He has less depression/anxiety symptoms after discharge.    Continue Psychosocial Services  Follow up required by staff             Psychosocial  Re-Evaluation:   Psychosocial Discharge (Final Psychosocial Re-Evaluation):   Vocational Rehabilitation: Provide vocational rehab assistance to qualifying candidates.   Vocational Rehab Evaluation & Intervention:   Education: Education Goals: Education classes will be provided on a variety of topics geared toward better understanding of heart health and risk factor modification. Participant will state understanding/return demonstration of topics presented as noted by education test scores.  Learning Barriers/Preferences:  Learning Barriers/Preferences - 10/16/22 1028       Learning Barriers/Preferences   Learning Barriers None    Learning Preferences None             General Cardiac Education Topics:  AED/CPR: - Group verbal and written instruction with the use of models to demonstrate the basic use of the AED with the basic ABC's of resuscitation.   Anatomy and Cardiac Procedures: - Group verbal and visual presentation and models provide information about basic cardiac anatomy and function. Reviews the testing methods done to diagnose heart disease and the outcomes of the test results. Describes the treatment choices: Medical Management, Angioplasty, or Coronary Bypass Surgery for treating various heart conditions including Myocardial Infarction, Angina, Valve Disease, and Cardiac Arrhythmias.  Written material given at graduation. Flowsheet Row Cardiac Rehab from 10/24/2022 in [facility] Cardiac and Pulmonary Rehab  Education need identified 10/24/22       Medication Safety: - Group verbal and visual instruction to review commonly prescribed medications for heart and lung disease. Reviews the medication, class of the drug, and side effects. Includes the steps to properly store meds and maintain the prescription regimen.  Written material given at graduation.   Intimacy: - Group verbal instruction through game format to discuss how heart and lung disease can affect sexual  intimacy. Written material given at graduation..   Know Your Numbers and Heart Failure: - Group verbal and visual instruction to discuss disease risk factors for cardiac and pulmonary disease and treatment options.  Reviews associated critical values for Overweight/Obesity, Hypertension, Cholesterol, and Diabetes.  Discusses basics of heart failure: signs/symptoms and treatments.  Introduces Heart Failure Zone chart for action plan for heart failure.  Written material given at graduation.   Infection Prevention: - Provides verbal and written material to individual with discussion of infection control including proper hand washing and proper equipment cleaning during exercise session. Flowsheet Row Cardiac Rehab from 10/24/2022 in [facility] Cardiac and Pulmonary Rehab  Education need identified 10/24/22  Date 10/24/22  Educator [place]  Instruction Review Code 1- Verbalizes Understanding       Falls Prevention: - Provides verbal and written material to individual with discussion of falls prevention and safety. Flowsheet Row Cardiac Rehab from 10/16/2022 in [facility] Cardiac and Pulmonary Rehab  Date 10/16/22  Educator [place]  Instruction Review Code 1- Verbalizes Understanding       Other: -Provides group and verbal instruction on various topics (see comments)   Knowledge Questionnaire Score:  Knowledge Questionnaire Score - 10/24/22 1114       Knowledge Questionnaire Score   Pre Score 22/26             Core Components/Risk Factors/Patient Goals at Admission:  Personal Goals and Risk Factors at Admission - 10/24/22 1142       Core Components/Risk Factors/Patient Goals on Admission    Weight Management Yes;Weight Loss;Obesity    Intervention Weight Management: Develop a combined nutrition and exercise program designed to reach desired caloric intake, while maintaining appropriate intake of nutrient and fiber, sodium and fats, and appropriate energy expenditure required for the weight  goal.;Weight Management/Obesity: Establish reasonable short term and long term weight goals.;Weight Management: Provide education and appropriate resources to help participant work on and attain dietary goals.    Admit Weight 236 lb (107 kg)    Goal Weight: Short Term 230 lb (104.3 kg)    Goal Weight: Long Term 200 lb (90.7 kg)    Expected Outcomes Short Term: Continue to assess and modify interventions until short term weight is achieved;Long Term: Adherence to nutrition and physical activity/exercise program aimed toward attainment of established weight goal;Weight Loss: Understanding of general recommendations for a balanced deficit meal plan, which promotes 1-2 lb weight loss per week and includes a negative energy balance of [phone] kcal/d;Understanding recommendations for meals to include 15-35% energy as protein, 25-35% energy from fat, 35-60% energy from carbohydrates, less than '200mg'$  of dietary cholesterol, 20-35 gm of total fiber daily;Understanding of distribution of calorie intake throughout the day with the consumption of 4-5 meals/snacks    Diabetes Yes    Intervention Provide education about signs/symptoms and action to take for hypo/hyperglycemia.;Provide education about proper nutrition, including hydration, and aerobic/resistive exercise prescription along with prescribed medications to achieve blood glucose in normal ranges: Fasting glucose 65-99 mg/dL    Expected Outcomes Short Term: Participant verbalizes understanding of the signs/symptoms and immediate care of hyper/hypoglycemia, proper foot care and importance of medication, aerobic/resistive exercise and nutrition plan for blood glucose control.;Long Term: Attainment of HbA1C < 7%.    Heart Failure Yes    Intervention Provide a combined exercise and nutrition program that is supplemented with education, support and counseling about heart failure. Directed toward relieving symptoms such as shortness of breath, decreased exercise  tolerance, and extremity edema.    Expected Outcomes Improve functional capacity of life;Short term: Attendance in program 2-3 days a week with increased exercise capacity. Reported lower sodium intake. Reported increased fruit and vegetable intake. Reports medication compliance.;Short term: Daily weights obtained and reported for increase. Utilizing diuretic protocols set by physician.;Long term: Adoption of self-care skills and reduction of barriers for early signs and symptoms recognition and intervention leading to self-care maintenance.    Hypertension Yes    Intervention Provide education on lifestyle modifcations including regular physical activity/exercise, weight management, moderate sodium restriction and increased consumption of fresh fruit, vegetables, and low fat dairy, alcohol moderation, and smoking cessation.;Monitor prescription use compliance.    Expected Outcomes Short Term: Continued assessment and intervention until BP is < 140/83m HG in hypertensive participants. < 130/833mHG in hypertensive participants with diabetes, heart failure or chronic kidney disease.;Long Term: Maintenance of blood pressure at goal levels.    Lipids Yes    Intervention Provide education and support for participant on nutrition & aerobic/resistive exercise along with prescribed medications to achieve LDL '70mg'$ , HDL >'40mg'$ .    Expected Outcomes Short Term: Participant states understanding of desired cholesterol values and is compliant with medications prescribed. Participant is following exercise prescription and nutrition guidelines.;Long Term: Cholesterol controlled with medications as prescribed, with individualized exercise RX and with personalized nutrition plan. Value goals: LDL < '70mg'$ , HDL > 40 mg.  Education:Diabetes - Individual verbal and written instruction to review signs/symptoms of diabetes, desired ranges of glucose level fasting, after meals and with exercise. Acknowledge that  pre and post exercise glucose checks will be done for 3 sessions at entry of program. [place] from 10/24/2022 in [facility] Cardiac and Pulmonary Rehab  Education need identified 10/24/22  Date 10/24/22  Educator [place]  Instruction Review Code 1- Verbalizes Understanding       Core Components/Risk Factors/Patient Goals Review:    Core Components/Risk Factors/Patient Goals at Discharge (Final Review):    ITP Comments:  ITP Comments     Row Name 10/16/22 1004 10/24/22 1113 10/30/22 1711 11/06/22 0750 11/13/22 1644   ITP Comments Virtual orientation call completed today. he has an appointment on Date: 10/24/2022  for EP eval and gym Orientation.  Documentation of diagnosis can be found in [facility] Date: 09/08/2022 . Completed 6MWT and gym orientation. Initial ITP created and sent for review to Dr. Emily Filbert, Medical Director. First full day of exercise!  Patient was oriented to gym and equipment including functions, settings, policies, and procedures.  Patient's individual exercise prescription and treatment plan were reviewed.  All starting workloads were established based on the results of the 6 minute walk test done at initial orientation visit.  The plan for exercise progression was also introduced and progression will be customized based on patient's performance and goals. 30 Day review completed. Medical Director ITP review done, changes made as directed, and signed approval by Medical Director.   New to program Called patient, hasn't attended cardiac rehab in the last week and a half. Patient states he got sick and is now in the hospital with fluid overload? Unable to see hospital notes at this time. Patient will call us after he gets discharged and will determine a return date/ possible clearance pending hospital visit diagnosis.    [place] Name 11/20/22 1304 11/26/22 1547 12/03/22 1103 12/04/22 1000     ITP Comments Corgan called to let us know that he was admitted to [facility]  for a bad cold.  He will remain out until he is feeling better. Patient called to let us know he is discharged from the hospital - had Rhinovirus. But still feeling some sickness and not up to his baseline. Patient will call us next week to let us know how he is doing. Tavien has not attended since 10/25/22.  He has been in the hospital and we were unable to assess his goal this go around. 30 Day review completed. Medical Director ITP review done, changes made as directed, and signed approval by Medical Director.             Comments:

## 2022-12-05 ENCOUNTER — Ambulatory Visit: Payer: Self-pay

## 2022-12-05 NOTE — Progress Notes (Signed)
Opened in error

## 2022-12-05 NOTE — Telephone Encounter (Signed)
He can have a sameday slot next week.

## 2022-12-05 NOTE — Telephone Encounter (Signed)
This medication was prescribed by his neurologist Dr. Krista Blue, but has not been refilled since 2020. I don't think he is still taking it. He needs to follow up with her if so.

## 2022-12-05 NOTE — Telephone Encounter (Signed)
    Chief Complaint: Fungal rash to testicles and buttocks. Seen at [facility] and told to follow up with PCP , per sister. Appointment 01/01/23. Asking to be worked in sooner. Symptoms: Painful rash. On Doxycycline and Nystatin. Frequency: weeks ago Pertinent Negatives: Patient denies  Disposition: '[]'$ ED /'[]'$ Urgent Care (no appt availability in office) / '[]'$ Appointment(In office/virtual)/ '[]'$  [place] [facility]/ '[]'$ Home Care/ '[]'$ Refused Recommended Disposition /'[]'$ [facility]/ '[x]'$  Follow-up with PCP Additional Notes: Please advise.  Answer Assessment - Initial Assessment Questions 1. APPEARANCE of RASH: "Describe the rash."      Red, purple 2. LOCATION: "Where is the rash located?"      Testicles, buttocks 3. NUMBER: "How many spots are there?"      All over skin 4. SIZE: "How big are the spots?" (Inches, centimeters or compare to size of a coin)      Large 5. ONSET: "When did the rash start?"      weeks 6. ITCHING: "Does the rash itch?" If Yes, ask: "How bad is the itch?"  (Scale 0-10; or none, mild, moderate, severe)     Moderate 7. PAIN: "Does the rash hurt?" If Yes, ask: "How bad is the pain?"  (Scale 0-10; or none, mild, moderate, severe)    - NONE (0): no pain    - MILD (1-3): doesn't interfere with normal activities     - MODERATE (4-7): interferes with normal activities or awakens from sleep     - SEVERE (8-10): excruciating pain, unable to do any normal activities     Moderate 8. OTHER SYMPTOMS: "Do you have any other symptoms?" (e.g., fever)     Pain, cyst-like area 9. PREGNANCY: "Is there any chance you are pregnant?" "When was your last menstrual period?"     N/a  Protocols used: Rash or Redness - Localized-A-AH

## 2022-12-06 NOTE — Telephone Encounter (Signed)
Patient advised he needs to have same provider prescribe medication.

## 2022-12-06 NOTE — Telephone Encounter (Signed)
Pt called asking about the Keppra.  He said Dr. Caryn Section has prescribed this before for him.  He said he gets them from the [place] too. But he didn't get them order in time.  CB#  [phone]

## 2022-12-10 NOTE — Telephone Encounter (Signed)
Appt made for 12/13/22 at 8:40

## 2022-12-11 ENCOUNTER — Telehealth: Payer: Self-pay

## 2022-12-11 ENCOUNTER — Ambulatory Visit: Payer: Medicare HMO

## 2022-12-11 NOTE — Telephone Encounter (Signed)
Attempted to call patient to check in to see how he is feeling/ when he is able to return to cardiac rehab. Has not been here since 11/22 due to being sick.

## 2022-12-13 ENCOUNTER — Ambulatory Visit: Payer: Medicare HMO | Admitting: Family Medicine

## 2022-12-13 NOTE — Progress Notes (Deleted)
I,Sean Hayden,acting as a Education administrator for Sean Huh, MD.,have documented all relevant documentation on the behalf of Sean Huh, MD,as directed by  Sean Huh, MD while in the presence of Sean Huh, MD.   Established patient visit   Patient: Sean Hayden   DOB: [birth year]   77 y.o. Male  MRN: [id number] Visit Date: 12/13/2022  Today's healthcare provider: Lelon Huh, MD   No chief complaint on file.  Subjective    Rash     Medications: Outpatient Medications Prior to Visit  Medication Sig  . acyclovir (ZOVIRAX) 400 MG tablet 1/2 tablet up to 5 times a day for cold sores  . albuterol (VENTOLIN HFA) 108 (90 Base) MCG/ACT inhaler Inhale 2 puffs into the lungs every 6 (six) hours as needed for wheezing or shortness of breath.  [name] atorvastatin (LIPITOR) 20 MG tablet Take 1 tablet (20 mg total) by mouth every evening. (Patient taking differently: Take 40 mg by mouth every evening.)  . benzocaine (ORAJEL) 10 % mucosal gel Use as directed 1 application in the mouth or throat as needed for mouth pain.  . cephALEXin (KEFLEX) 500 MG capsule TAKE ONE CAPSULE BY MOUTH FOUR TIMES A DAY FOR BACTERIAL INFECTION  . Cholecalciferol (VITAMIN D PO) Take 2,000 Units by mouth daily.   . Docosanol 10 % CREA Apply topically up to 5 times per day as needed until heals  . empagliflozin (JARDIANCE) 25 MG TABS tablet Take 25 mg by mouth daily.  . Flaxseed, Linseed, (FLAXSEED OIL) 1000 MG CAPS Take 1,000 mg by mouth 2 (two) times daily.  . fluticasone (FLONASE) 50 MCG/ACT nasal spray Place 2 sprays into both nostrils daily. (Patient taking differently: Place 2 sprays into both nostrils 2 (two) times daily.)  . furosemide (LASIX) 20 MG tablet TAKE ONE (1) TABLET BY MOUTH ONCE DAILY AS NEEDED FOR EDEMA (TAKE 1 TABLET DAILYAS NEEDED FOR WEIGHT GAIN > 2 POUNDS IN 24 HOURS)  . gabapentin (NEURONTIN) 100 MG capsule Take 1 capsule (100 mg total) by mouth 2 (two) times daily.  . insulin detemir  (LEVEMIR) 100 UNIT/ML injection Inject 0.3 mLs (30 Units total) into the skin 2 (two) times daily. (Patient taking differently: Inject 10 Units into the skin 2 (two) times daily.)  . levETIRAcetam (KEPPRA) 750 MG tablet Take 2 tablets (1,500 mg total) by mouth 2 (two) times daily. 3 in the am and 3 at bedtime (Patient taking differently: Take 1,500 mg by mouth 2 (two) times daily.)  . lisinopril (ZESTRIL) 2.5 MG tablet Take 5 mg by mouth daily.  [name] loratadine (CLARITIN) 10 MG tablet Take 10 mg by mouth daily.   . metFORMIN (GLUCOPHAGE) 850 MG tablet Take 1 tablet (850 mg total) by mouth 2 (two) times daily with a meal.  . metoprolol tartrate (LOPRESSOR) 25 MG tablet Take 0.5 tablets by mouth every 12 (twelve) hours.  . Misc Natural Products (OSTEO BI-FLEX TRIPLE STRENGTH PO) Take 1 tablet by mouth 2 (two) times daily.   [name] MISC NATURAL PRODUCTS PO Take 1 capsule by mouth in the morning and at bedtime. Super Beta Prostate  . montelukast (SINGULAIR) 10 MG tablet TAKE ONE TABLET EVERY DAY FOR CHRONIC COUGH  . Omega-3 Fatty Acids (FISH OIL) 1000 MG CPDR Take 1,200 mg by mouth 2 (two) times daily.   [name] PARoxetine (PAXIL) 40 MG tablet TAKE 1 TABLET BY MOUTH DAILY.  . rivaroxaban (XARELTO) 20 MG TABS tablet TAKE ONE TABLET BY MOUTH DAILY WITH SUPPER  .  tamsulosin (FLOMAX) 0.4 MG CAPS capsule Take 1 capsule by mouth daily.  [name] zolpidem (AMBIEN) 10 MG tablet TAKE 1/2-1 TABLET BY MOUTH AT BEDTIME. (Patient not taking: Reported on 10/16/2022)   No facility-administered medications prior to visit.    Review of Systems  Skin:  Positive for rash.   {Labs  Heme  Chem  Endocrine  Serology  Results Review (optional):23779}   Objective    There were no vitals taken for this visit. {Show previous vital signs (optional):23777}  Physical Exam  ***  No results found for any visits on 12/13/22.  Assessment & Plan     ***  No follow-ups on file.      {provider attestation***:1}   Sean Huh, MD   [facility] [phone] (phone) [phone] (fax)  [place]

## 2022-12-16 ENCOUNTER — Ambulatory Visit: Payer: Medicare HMO

## 2022-12-18 ENCOUNTER — Ambulatory Visit: Payer: Medicare HMO

## 2022-12-23 ENCOUNTER — Ambulatory Visit: Payer: Medicare HMO

## 2022-12-23 DIAGNOSIS — Z952 Presence of prosthetic heart valve: Secondary | ICD-10-CM

## 2022-12-23 NOTE — Telephone Encounter (Signed)
Attempted to call patient again to receive any update on how patient is feeling. Left voicemail. Has not attended rehab since 11/22. Will send letter at this time.

## 2022-12-24 HISTORY — PX: MOHS SURGERY: SUR867

## 2022-12-25 ENCOUNTER — Ambulatory Visit: Payer: Medicare HMO

## 2022-12-30 ENCOUNTER — Ambulatory Visit: Payer: Medicare HMO

## 2023-01-01 ENCOUNTER — Ambulatory Visit: Payer: Medicare HMO | Admitting: Family Medicine

## 2023-01-01 ENCOUNTER — Encounter: Payer: Self-pay | Admitting: *Deleted

## 2023-01-01 ENCOUNTER — Ambulatory Visit: Payer: Medicare HMO

## 2023-01-01 DIAGNOSIS — Z952 Presence of prosthetic heart valve: Secondary | ICD-10-CM

## 2023-01-01 NOTE — Progress Notes (Signed)
Cardiac Individual Treatment Plan  Patient Details  Name: Sean Hayden MRN: [id number] Date of Birth: 01/27/1946 Referring Provider:   Flowsheet Row Cardiac Rehab from 10/24/2022 in [facility] Cardiac and Pulmonary Rehab  Referring Provider Lelon Huh MD       Initial Encounter Date:  Flowsheet Row Cardiac Rehab from 10/24/2022 in [facility] Cardiac and Pulmonary Rehab  Date 10/24/22       Visit Diagnosis: S/P TAVR (transcatheter aortic valve replacement)  Patient's Home Medications on Admission:  Current Outpatient Medications:    acyclovir (ZOVIRAX) 400 MG tablet, 1/2 tablet up to 5 times a day for cold sores, Disp: 30 tablet, Rfl: 5   albuterol (VENTOLIN HFA) 108 (90 Base) MCG/ACT inhaler, Inhale 2 puffs into the lungs every 6 (six) hours as needed for wheezing or shortness of breath., Disp: , Rfl:    atorvastatin (LIPITOR) 20 MG tablet, Take 1 tablet (20 mg total) by mouth every evening. (Patient taking differently: Take 40 mg by mouth every evening.), Disp: 90 tablet, Rfl: 3   benzocaine (ORAJEL) 10 % mucosal gel, Use as directed 1 application in the mouth or throat as needed for mouth pain., Disp: , Rfl:    cephALEXin (KEFLEX) 500 MG capsule, TAKE ONE CAPSULE BY MOUTH FOUR TIMES A DAY FOR BACTERIAL INFECTION, Disp: , Rfl:    Cholecalciferol (VITAMIN D PO), Take 2,000 Units by mouth daily. , Disp: , Rfl:    Docosanol 10 % CREA, Apply topically up to 5 times per day as needed until heals, Disp: 2 g, Rfl: 3   empagliflozin (JARDIANCE) 25 MG TABS tablet, Take 25 mg by mouth daily., Disp: 90 tablet, Rfl: 4   Flaxseed, Linseed, (FLAXSEED OIL) 1000 MG CAPS, Take 1,000 mg by mouth 2 (two) times daily., Disp: , Rfl:    fluticasone (FLONASE) 50 MCG/ACT nasal spray, Place 2 sprays into both nostrils daily. (Patient taking differently: Place 2 sprays into both nostrils 2 (two) times daily.), Disp: 16 g, Rfl: 1   furosemide (LASIX) 20 MG tablet, TAKE ONE (1) TABLET BY MOUTH ONCE DAILY AS NEEDED  FOR EDEMA (TAKE 1 TABLET DAILYAS NEEDED FOR WEIGHT GAIN > 2 POUNDS IN 24 HOURS), Disp: 30 tablet, Rfl: 1   gabapentin (NEURONTIN) 100 MG capsule, Take 1 capsule (100 mg total) by mouth 2 (two) times daily., Disp: 180 capsule, Rfl: 1   insulin detemir (LEVEMIR) 100 UNIT/ML injection, Inject 0.3 mLs (30 Units total) into the skin 2 (two) times daily. (Patient taking differently: Inject 10 Units into the skin 2 (two) times daily.), Disp: 100 mL, Rfl: 1   levETIRAcetam (KEPPRA) 750 MG tablet, Take 2 tablets (1,500 mg total) by mouth 2 (two) times daily. 3 in the am and 3 at bedtime (Patient taking differently: Take 1,500 mg by mouth 2 (two) times daily.), Disp: 360 tablet, Rfl: 4   lisinopril (ZESTRIL) 2.5 MG tablet, Take 5 mg by mouth daily., Disp: , Rfl:    loratadine (CLARITIN) 10 MG tablet, Take 10 mg by mouth daily. , Disp: , Rfl:    metFORMIN (GLUCOPHAGE) 850 MG tablet, Take 1 tablet (850 mg total) by mouth 2 (two) times daily with a meal., Disp: 180 tablet, Rfl: 1   metoprolol tartrate (LOPRESSOR) 25 MG tablet, Take 0.5 tablets by mouth every 12 (twelve) hours., Disp: , Rfl:    Misc Natural Products (OSTEO BI-FLEX TRIPLE STRENGTH PO), Take 1 tablet by mouth 2 (two) times daily. , Disp: , Rfl:    MISC NATURAL PRODUCTS PO,  Take 1 capsule by mouth in the morning and at bedtime. Super Beta Prostate, Disp: , Rfl:    montelukast (SINGULAIR) 10 MG tablet, TAKE ONE TABLET EVERY DAY FOR CHRONIC COUGH, Disp: 30 tablet, Rfl: 12   Omega-3 Fatty Acids (FISH OIL) 1000 MG CPDR, Take 1,200 mg by mouth 2 (two) times daily. , Disp: , Rfl:    PARoxetine (PAXIL) 40 MG tablet, TAKE 1 TABLET BY MOUTH DAILY., Disp: 30 tablet, Rfl: 0   rivaroxaban (XARELTO) 20 MG TABS tablet, TAKE ONE TABLET BY MOUTH DAILY WITH SUPPER, Disp: 30 tablet, Rfl: 12   tamsulosin (FLOMAX) 0.4 MG CAPS capsule, Take 1 capsule by mouth daily., Disp: , Rfl:    zolpidem (AMBIEN) 10 MG tablet, TAKE 1/2-1 TABLET BY MOUTH AT BEDTIME. (Patient not  taking: Reported on 10/16/2022), Disp: 90 tablet, Rfl: 3  Past Medical History: Past Medical History:  Diagnosis Date   Aortic stenosis    moderate by 02/2016 echo   Aortic valve stenosis, congenital    Moderate by echo 2017   B12 deficiency    Cancer (HCC)    skin cancer - face    CVA (cerebral vascular accident) ([place])    02/2016 no residual   Diabetes mellitus without complication ([place])    Gout    Hemorrhoids    History of kidney stones    Hyperlipidemia    Hyperplastic colon polyp    Hypertension    MRSA infection    Neuropathy    PAF (paroxysmal atrial fibrillation) ([place])    Personal history of urinary calculi 05/25/2015   Pneumonia    1990's   Seizures ([place])     Tobacco Use: Social History   Tobacco Use  Smoking Status Former   Types: Cigars   Quit date: 04/08/2012   Years since quitting: 10.7  Smokeless Tobacco Never  Tobacco Comments   cigarettes and cigars  all quit 12 years ago    Labs: Review Flowsheet  More data exists      Latest Ref Rng & Units 02/07/2020 05/16/2020 07/18/2021 01/25/2022 05/20/2022  Labs for ITP Cardiac and Pulmonary Rehab  Cholestrol 100 - 199 mg/dL 123     - 122  111  -  LDL (calc) 0 - 99 mg/dL 49     - 51  45  -  HDL-C >39 mg/dL 37     - 34  33  -  Trlycerides 0 - 149 mg/dL 184     - 231  200  -  Hemoglobin A1c 4.0 - 5.6 % 7.7     7.6  8.9  8.5  7.6     Details       This result is from an external source.          Exercise Target Goals: Exercise Program Goal: Individual exercise prescription set using results from initial 6 min walk test and THRR while considering  patient's activity barriers and safety.   Exercise Prescription Goal: Initial exercise prescription builds to 30-45 minutes a day of aerobic activity, 2-3 days per week.  Home exercise guidelines will be given to patient during program as part of exercise prescription that the participant will acknowledge.   Education: Aerobic Exercise: - Group verbal and visual  presentation on the components of exercise prescription. Introduces F.I.T.T principle from ACSM for exercise prescriptions.  Reviews F.I.T.T. principles of aerobic exercise including progression. Written material given at graduation. Flowsheet Row Cardiac Rehab from 10/24/2022 in [facility] Cardiac and Pulmonary Rehab  Education need identified 10/24/22       Education: Resistance Exercise: - Group verbal and visual presentation on the components of exercise prescription. Introduces F.I.T.T principle from ACSM for exercise prescriptions  Reviews F.I.T.T. principles of resistance exercise including progression. Written material given at graduation.    Education: Exercise & Equipment Safety: - Individual verbal instruction and demonstration of equipment use and safety with use of the equipment. Flowsheet Row Cardiac Rehab from 10/24/2022 in [facility] Cardiac and Pulmonary Rehab  Education need identified 10/24/22  Date 10/24/22  Educator [place]  Instruction Review Code 1- Verbalizes Understanding       Education: Exercise Physiology & General Exercise Guidelines: - Group verbal and written instruction with models to review the exercise physiology of the cardiovascular system and associated critical values. Provides general exercise guidelines with specific guidelines to those with heart or lung disease.  Flowsheet Row Cardiac Rehab from 10/24/2022 in [facility] Cardiac and Pulmonary Rehab  Education need identified 10/24/22       Education: Flexibility, Balance, Mind/Body Relaxation: - Group verbal and visual presentation with interactive activity on the components of exercise prescription. Introduces F.I.T.T principle from ACSM for exercise prescriptions. Reviews F.I.T.T. principles of flexibility and balance exercise training including progression. Also discusses the mind body connection.  Reviews various relaxation techniques to help reduce and manage stress (i.e. Deep breathing, progressive muscle  relaxation, and visualization). Balance handout provided to take home. Written material given at graduation.   Activity Barriers & Risk Stratification:  Activity Barriers & Cardiac Risk Stratification - 10/24/22 1123       Activity Barriers & Cardiac Risk Stratification   Activity Barriers Deconditioning;Muscular Weakness;Shortness of Breath;Arthritis;Other (comment);Decreased Ventricular Function    Comments Seizures, resulting in hand numbness    Cardiac Risk Stratification High             6 Minute Walk:  6 Minute Walk     Row Name 10/24/22 1128         6 Minute Walk   Phase Initial     Distance 550 feet     Walk Time 6 minutes     # of Rest Breaks 0     MPH 1.04     METS 0.84     RPE 16     Perceived Dyspnea  1     VO2 Peak 2.96     Symptoms Yes (comment)     Comments Fatigue     Resting HR 90 bpm     Resting BP 118/72     Resting Oxygen Saturation  94 %     Exercise Oxygen Saturation  during 6 min walk 90 %     Max Ex. HR 120 bpm     Max Ex. BP 126/68     2 Minute Post BP 120/74              Oxygen Initial Assessment:   Oxygen Re-Evaluation:   Oxygen Discharge (Final Oxygen Re-Evaluation):   Initial Exercise Prescription:  Initial Exercise Prescription - 10/24/22 1100       Date of Initial Exercise RX and Referring Provider   Date 10/24/22    Referring Provider Lelon Huh MD      Oxygen   Maintain Oxygen Saturation 88% or higher      Recumbant Bike   Level 1    RPM 60    Watts 10    Minutes 15    METs 1      NuStep  Level 1    SPM 80    Minutes 15    METs 1      T5 Nustep   Level 1    SPM 80    Minutes 15    METs 1      Track   Laps 8    Minutes 15    METs 1.44      Prescription Details   Frequency (times per week) 2    Duration Progress to 30 minutes of continuous aerobic without signs/symptoms of physical distress      Intensity   THRR 40-80% of Max Heartrate 111 - 133    Ratings of Perceived Exertion  11-13    Perceived Dyspnea 0-4      Progression   Progression Continue to progress workloads to maintain intensity without signs/symptoms of physical distress.      Resistance Training   Training Prescription Yes    Weight 2 lb    Reps 10-15             Perform Capillary Blood Glucose checks as needed.  Exercise Prescription Changes:   Exercise Prescription Changes     Row Name 10/24/22 1100 11/04/22 1500           Response to Exercise   Blood Pressure (Admit) 118/72 108/70      Blood Pressure (Exercise) 126/68 120/72      Blood Pressure (Exit) 120/74 106/66      Heart Rate (Admit) 90 bpm 92 bpm      Heart Rate (Exercise) 120 bpm 112 bpm      Heart Rate (Exit) 87 bpm 99 bpm      Oxygen Saturation (Admit) 94 % --      Oxygen Saturation (Exercise) 90 % --      Oxygen Saturation (Exit) 94 % --      Rating of Perceived Exertion (Exercise) 16 15      Perceived Dyspnea (Exercise) 1 --      Symptoms Fatigue none      Comments walk test results 2nd full day of exercise      Duration -- Progress to 30 minutes of  aerobic without signs/symptoms of physical distress      Intensity -- THRR unchanged        Progression   Progression -- Continue to progress workloads to maintain intensity without signs/symptoms of physical distress.      Average METs -- 2.01        Resistance Training   Training Prescription -- Yes      Weight -- 2 lb      Reps -- 10-15        Interval Training   Interval Training -- No        NuStep   Level -- 1      Minutes -- 15      METs -- 2        Track   Laps -- 14      Minutes -- 15      METs -- 1.76        Oxygen   Maintain Oxygen Saturation -- 88% or higher               Exercise Comments:   Exercise Comments     Row Name 10/30/22 1711           Exercise Comments First full day of exercise!  Patient was oriented to gym and equipment including functions, settings, policies, and  procedures.  Patient's individual exercise  prescription and treatment plan were reviewed.  All starting workloads were established based on the results of the 6 minute walk test done at initial orientation visit.  The plan for exercise progression was also introduced and progression will be customized based on patient's performance and goals.                Exercise Goals and Review:   Exercise Goals     Row Name 10/24/22 1141             Exercise Goals   Increase Physical Activity Yes       Intervention Provide advice, education, support and counseling about physical activity/exercise needs.;Develop an individualized exercise prescription for aerobic and resistive training based on initial evaluation findings, risk stratification, comorbidities and participant's personal goals.       Expected Outcomes Short Term: Attend rehab on a regular basis to increase amount of physical activity.;Long Term: Add in home exercise to make exercise part of routine and to increase amount of physical activity.;Long Term: Exercising regularly at least 3-5 days a week.       Increase Strength and Stamina Yes       Intervention Provide advice, education, support and counseling about physical activity/exercise needs.;Develop an individualized exercise prescription for aerobic and resistive training based on initial evaluation findings, risk stratification, comorbidities and participant's personal goals.       Expected Outcomes Short Term: Increase workloads from initial exercise prescription for resistance, speed, and METs.;Short Term: Perform resistance training exercises routinely during rehab and add in resistance training at home;Long Term: Improve cardiorespiratory fitness, muscular endurance and strength as measured by increased METs and functional capacity (6MWT)       Able to understand and use rate of perceived exertion (RPE) scale Yes       Intervention Provide education and explanation on how to use RPE scale       Expected Outcomes Short  Term: Able to use RPE daily in rehab to express subjective intensity level;Long Term:  Able to use RPE to guide intensity level when exercising independently       Able to understand and use Dyspnea scale Yes       Intervention Provide education and explanation on how to use Dyspnea scale       Expected Outcomes Short Term: Able to use Dyspnea scale daily in rehab to express subjective sense of shortness of breath during exertion;Long Term: Able to use Dyspnea scale to guide intensity level when exercising independently       Knowledge and understanding of Target Heart Rate Range (THRR) Yes       Intervention Provide education and explanation of THRR including how the numbers were predicted and where they are located for reference       Expected Outcomes Short Term: Able to state/look up THRR;Long Term: Able to use THRR to govern intensity when exercising independently;Short Term: Able to use daily as guideline for intensity in rehab       Able to check pulse independently Yes       Intervention Provide education and demonstration on how to check pulse in carotid and radial arteries.;Review the importance of being able to check your own pulse for safety during independent exercise       Expected Outcomes Short Term: Able to explain why pulse checking is important during independent exercise;Long Term: Able to check pulse independently and accurately       Understanding of  Exercise Prescription Yes       Intervention Provide education, explanation, and written materials on patient's individual exercise prescription       Expected Outcomes Short Term: Able to explain program exercise prescription;Long Term: Able to explain home exercise prescription to exercise independently                Exercise Goals Re-Evaluation :  Exercise Goals Re-Evaluation     Row Name 10/30/22 1712 11/04/22 1535 11/21/22 1423 12/05/22 1201 12/17/22 1358     Exercise Goal Re-Evaluation   Exercise Goals Review  Increase Physical Activity;Able to understand and use rate of perceived exertion (RPE) scale;Knowledge and understanding of Target Heart Rate Range (THRR);Understanding of Exercise Prescription;Increase Strength and Stamina;Able to check pulse independently Increase Physical Activity;Increase Strength and Stamina;Understanding of Exercise Prescription Increase Physical Activity;Increase Strength and Stamina;Understanding of Exercise Prescription Increase Physical Activity;Increase Strength and Stamina;Understanding of Exercise Prescription Increase Physical Activity;Increase Strength and Stamina;Understanding of Exercise Prescription   Comments Reviewed RPE scale, THR and program prescription with pt today.  Pt voiced understanding and was given a copy of goals to take home. Saverio is off to a good start with rehab for the first couple of sessions he has been here. He was able to walk a full 14 laps on the track! We will continue to monitor as he progresses further in the program. Jaimere has not attended rehab since the last review. We will reach out to him and remind him of the importance of attending rehab consistently for the greatest benefit. We will continue to monitor when he returns. Syncere has been recently discharged from the hospital, having rhinovirus. He did let us know that he was still wasn't feeling the best and will let us know when he feels ready to come back. Will continue to update with patient. Alexiz continues to be out of rehab, after being discharged from the hospital from having rhinovirus. He did let us know that he still wasn't feeling the best and will let us know when he feels ready to come back. We will continue to update with patient.   Expected Outcomes Short: Use RPE daily to regulate intensity.  Long: Follow program prescription in THR. Short: continue to follow initial exercise prescription Long: Continue to build up overall strength and stamina Short: Return to regular  attendance. Long: Continue to increase strength and stamina. Short: Return to rehab when feeling better Long: Continue to increase overall MET level Short: Return to rehab when feeling better Long: Continue to improve strength and stamina.            Discharge Exercise Prescription (Final Exercise Prescription Changes):  Exercise Prescription Changes - 11/04/22 1500       Response to Exercise   Blood Pressure (Admit) 108/70    Blood Pressure (Exercise) 120/72    Blood Pressure (Exit) 106/66    Heart Rate (Admit) 92 bpm    Heart Rate (Exercise) 112 bpm    Heart Rate (Exit) 99 bpm    Rating of Perceived Exertion (Exercise) 15    Symptoms none    Comments 2nd full day of exercise    Duration Progress to 30 minutes of  aerobic without signs/symptoms of physical distress    Intensity THRR unchanged      Progression   Progression Continue to progress workloads to maintain intensity without signs/symptoms of physical distress.    Average METs 2.01      Resistance Training   Training Prescription Yes  Weight 2 lb    Reps 10-15      Interval Training   Interval Training No      NuStep   Level 1    Minutes 15    METs 2      Track   Laps 14    Minutes 15    METs 1.76      Oxygen   Maintain Oxygen Saturation 88% or higher             Nutrition:  Target Goals: Understanding of nutrition guidelines, daily intake of sodium '1500mg'$ , cholesterol '200mg'$ , calories 30% from fat and 7% or less from saturated fats, daily to have 5 or more servings of fruits and vegetables.  Education: All About Nutrition: -Group instruction provided by verbal, written material, interactive activities, discussions, models, and posters to present general guidelines for heart healthy nutrition including fat, fiber, MyPlate, the role of sodium in heart healthy nutrition, utilization of the nutrition label, and utilization of this knowledge for meal planning. Follow up email sent as well. Written  material given at graduation. Flowsheet Row Cardiac Rehab from 10/24/2022 in [facility] Cardiac and Pulmonary Rehab  Education need identified 10/24/22       Biometrics:  Pre Biometrics - 10/24/22 1128       Pre Biometrics   Height 5' 6.75" (1.695 m)    Weight 236 lb 1.6 oz (107.1 kg)    BMI (Calculated) 37.28    Single Leg Stand 0 seconds              Nutrition Therapy Plan and Nutrition Goals:  Nutrition Therapy & Goals - 10/24/22 1117       Intervention Plan   Intervention Prescribe, educate and counsel regarding individualized specific dietary modifications aiming towards targeted core components such as weight, hypertension, lipid management, diabetes, heart failure and other comorbidities.    Expected Outcomes Short Term Goal: Understand basic principles of dietary content, such as calories, fat, sodium, cholesterol and nutrients.;Short Term Goal: A plan has been developed with personal nutrition goals set during dietitian appointment.;Long Term Goal: Adherence to prescribed nutrition plan.             Nutrition Assessments:  MEDIFICTS Score Key: ?70 Need to make dietary changes  40-70 Heart Healthy Diet ? 40 Therapeutic Level Cholesterol Diet  Flowsheet Row Cardiac Rehab from 10/24/2022 in [facility] Cardiac and Pulmonary Rehab  Picture Your Plate Total Score on Admission 51      Picture Your Plate Scores: <50 Unhealthy dietary pattern with much room for improvement. 41-50 Dietary pattern unlikely to meet recommendations for good health and room for improvement. 51-60 More healthful dietary pattern, with some room for improvement.  >60 Healthy dietary pattern, although there may be some specific behaviors that could be improved.    Nutrition Goals Re-Evaluation:   Nutrition Goals Discharge (Final Nutrition Goals Re-Evaluation):   Psychosocial: Target Goals: Acknowledge presence or absence of significant depression and/or stress, maximize coping skills,  provide positive support system. Participant is able to verbalize types and ability to use techniques and skills needed for reducing stress and depression.   Education: Stress, Anxiety, and Depression - Group verbal and visual presentation to define topics covered.  Reviews how body is impacted by stress, anxiety, and depression.  Also discusses healthy ways to reduce stress and to treat/manage anxiety and depression.  Written material given at graduation.   Education: Sleep Hygiene -Provides group verbal and written instruction about how sleep can affect your health.  Define sleep hygiene, discuss sleep cycles and impact of sleep habits. Review good sleep hygiene tips.    Initial Review & Psychosocial Screening:  Initial Psych Review & Screening - 10/16/22 1024       Initial Review   Current issues with Current Psychotropic Meds;History of Depression   stress depression while caring for his ailing wife. doing better now     [place]? Yes   new wife, their children, 6 sons,  grandson   Concerns Recent loss of significant other    Comments first wife died after illness,  3 years later remarried.  HAa been married 3 years      Barriers   Psychosocial barriers to participate in program There are no identifiable barriers or psychosocial needs.      Screening Interventions   Interventions Encouraged to exercise    Expected Outcomes Short Term goal: Utilizing psychosocial counselor, staff and physician to assist with identification of specific Stressors or current issues interfering with healing process. Setting desired goal for each stressor or current issue identified.;Long Term Goal: Stressors or current issues are controlled or eliminated.;Short Term goal: Identification and review with participant of any Quality of Life or Depression concerns found by scoring the questionnaire.;Long Term goal: The participant improves quality of Life and PHQ9 Scores as seen by  post scores and/or verbalization of changes             Quality of Life Scores:   Quality of Life - 10/24/22 1122       Quality of Life   Select Quality of Life      Quality of Life Scores   Health/Function Pre 2037 %    Socioeconomic Pre 27.57 %    Psych/Spiritual Pre 29.14 %    Family Pre 27 %    GLOBAL Pre 24.63 %            Scores of 19 and below usually indicate a poorer quality of life in these areas.  A difference of  2-3 points is a clinically meaningful difference.  A difference of 2-3 points in the total score of the Quality of Life Index has been associated with significant improvement in overall quality of life, self-image, physical symptoms, and general health in studies assessing change in quality of life.  PHQ-9: Review Flowsheet  More data exists      10/24/2022 01/03/2022 07/18/2021 12/28/2020 12/27/2019  Depression screen PHQ 2/9  Decreased Interest 0 0 2 0 0 0  Down, Depressed, Hopeless 1 0 1 0 0 0  PHQ - 2 Score 1 0 3 0 0 0  Altered sleeping 0 - 3 - -  Tired, decreased energy 1 - 3 - -  Change in appetite 1 - 3 - -  Feeling bad or failure about yourself  0 - 0 - -  Trouble concentrating 1 - 0 - -  Moving slowly or fidgety/restless 0 - 0 - -  Suicidal thoughts 0 - 0 - -  PHQ-9 Score 4 - 12 - -  Difficult doing work/chores Somewhat difficult - Not difficult at all - -   Interpretation of Total Score  Total Score Depression Severity:  1-4 = Minimal depression, 5-9 = Mild depression, 10-14 = Moderate depression, 15-19 = Moderately severe depression, 20-27 = Severe depression   Psychosocial Evaluation and Intervention:  Psychosocial Evaluation - 10/16/22 1034       Psychosocial Evaluation & Interventions   Interventions Encouraged to exercise with  the program and follow exercise prescription    Comments Audry has no barriers to attending the program. He lives with his new wife (65yr) and they have a dog. He remarried 3 years after his first wife  passed away. 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 THis added stress to his life and he started PAxil. He continues to use Paxil. He stated he is feeling less anxious at this time. He has support from his wife and children. One grandson comes to help with chores at times. He is ready to get started and work on improving his shortness of breath symptoms.    Expected Outcomes STG FMarvelleattends all scheduled sessions. He is able to see decrease of his shortness of breath symptoms. He continues to have less depression/anxiety symptoms LTG FGreycencontinues withhis exercise progeression and decrease of his shortness of breath symptoms. He has less depression/anxiety symptoms after discharge.    Continue Psychosocial Services  Follow up required by staff             Psychosocial Re-Evaluation:   Psychosocial Discharge (Final Psychosocial Re-Evaluation):   Vocational Rehabilitation: Provide vocational rehab assistance to qualifying candidates.   Vocational Rehab Evaluation & Intervention:   Education: Education Goals: Education classes will be provided on a variety of topics geared toward better understanding of heart health and risk factor modification. Participant will state understanding/return demonstration of topics presented as noted by education test scores.  Learning Barriers/Preferences:  Learning Barriers/Preferences - 10/16/22 1028       Learning Barriers/Preferences   Learning Barriers None    Learning Preferences None             General Cardiac Education Topics:  AED/CPR: - Group verbal and written instruction with the use of models to demonstrate the basic use of the AED with the basic ABC's of resuscitation.   Anatomy and Cardiac Procedures: - Group verbal and visual presentation and models provide information about basic cardiac anatomy and function. Reviews the testing methods done to diagnose heart disease and the outcomes of  the test results. Describes the treatment choices: Medical Management, Angioplasty, or Coronary Bypass Surgery for treating various heart conditions including Myocardial Infarction, Angina, Valve Disease, and Cardiac Arrhythmias.  Written material given at graduation. Flowsheet [facility]  Education need identified 10/24/22       Medication Safety: - Group verbal and visual instruction to review commonly prescribed medications for heart and lung disease. Reviews the medication, class of the drug, and side effects. Includes the steps to properly store meds and maintain the prescription regimen.  Written material given at graduation.   Intimacy: - Group verbal instruction through game format to discuss how heart and lung disease can affect sexual intimacy. Written material given at graduation..   Know Your Numbers and Heart Failure: - Group verbal and visual instruction to discuss disease risk factors for cardiac and pulmonary disease and treatment options.  Reviews associated critical values for Overweight/Obesity, Hypertension, Cholesterol, and Diabetes.  Discusses basics of heart failure: signs/symptoms and treatments.  Introduces Heart Failure Zone chart for action plan for heart failure.  Written material given at graduation.   Infection Prevention: - Provides verbal and written material to individual with discussion of infection control including proper hand washing and proper equipment cleaning during exercise session. Flowsheet [facility]  Education need identified  10/24/22  Date 10/24/22  Educator [place]  Instruction Review Code 1- Verbalizes Understanding       Falls Prevention: - Provides verbal and written material to individual with discussion of falls prevention and safety. Flowsheet Row Cardiac Rehab from [facility]16/2022 in [facility]  Date  10/16/22  Educator SB  Instruction Review Code 1- Verbalizes Understanding       Other: -Provides group and verbal instruction on various topics (see comments)   Knowledge Questionnaire Score:  Knowledge Questionnaire Score - 10/24/22 1114       Knowledge Questionnaire Score   Pre Score 22/26             Core Components/Risk Factors/Patient Goals at Admission:  Personal Goals and Risk Factors at Admission - 10/24/22 1142       Core Components/Risk Factors/Patient Goals on Admission    Weight Management Yes;Weight Loss;Obesity    Intervention Weight Management: Develop a combined nutrition and exercise program designed to reach desired caloric intake, while maintaining appropriate intake of nutrient and fiber, sodium and fats, and appropriate energy expenditure required for the weight goal.;Weight Management/Obesity: Establish reasonable short term and long term weight goals.;Weight Management: Provide education and appropriate resources to help participant work on and attain dietary goals.    Admit Weight 236 lb (107 kg)    Goal Weight: Short Term 230 lb (104.3 kg)    Goal Weight: Long Term 200 lb (90.7 kg)    Expected Outcomes Short Term: Continue to assess and modify interventions until short term weight is achieved;Long Term: Adherence to nutrition and physical activity/exercise program aimed toward attainment of established weight goal;Weight Loss: Understanding of general recommendations for a balanced deficit meal plan, which promotes 1-2 lb weight loss per week and includes a negative energy balance of [id number] kcal/d;Understanding recommendations for meals to include 15-35% energy as protein, 25-35% energy from fat, 35-60% energy from carbohydrates, less than '200mg'$  of dietary cholesterol, 20-35 gm of total fiber daily;Understanding of distribution of calorie intake throughout the day with the consumption of 4-5 meals/snacks    Diabetes Yes    Intervention Provide education  about signs/symptoms and action to take for hypo/hyperglycemia.;Provide education about proper nutrition, including hydration, and aerobic/resistive exercise prescription along with prescribed medications to achieve blood glucose in normal ranges: Fasting glucose 65-99 mg/dL    Expected Outcomes Short Term: Participant verbalizes understanding of the signs/symptoms and immediate care of hyper/hypoglycemia, proper foot care and importance of medication, aerobic/resistive exercise and nutrition plan for blood glucose control.;Long Term: Attainment of HbA1C < 7%.    Heart Failure Yes    Intervention Provide a combined exercise and nutrition program that is supplemented with education, support and counseling about heart failure. Directed toward relieving symptoms such as shortness of breath, decreased exercise tolerance, and extremity edema.    Expected Outcomes Improve functional capacity of life;Short term: Attendance in program 2-3 days a week with increased exercise capacity. Reported lower sodium intake. Reported increased fruit and vegetable intake. Reports medication compliance.;Short term: Daily weights obtained and reported for increase. Utilizing diuretic protocols set by physician.;Long term: Adoption of self-care skills and reduction of barriers for early signs and symptoms recognition and intervention leading to self-care maintenance.    Hypertension Yes    Intervention Provide education on lifestyle modifcations including regular physical activity/exercise, weight management, moderate sodium restriction and increased consumption of fresh fruit, vegetables, and low fat dairy, alcohol moderation, and smoking cessation.;Monitor prescription use compliance.    Expected Outcomes Short  Term: Continued assessment and intervention until BP is < 140/22m HG in hypertensive participants. < 130/892mHG in hypertensive participants with diabetes, heart failure or chronic kidney disease.;Long Term: Maintenance  of blood pressure at goal levels.    Lipids Yes    Intervention Provide education and support for participant on nutrition & aerobic/resistive exercise along with prescribed medications to achieve LDL '70mg'$ , HDL >'40mg'$ .    Expected Outcomes Short Term: Participant states understanding of desired cholesterol values and is compliant with medications prescribed. Participant is following exercise prescription and nutrition guidelines.;Long Term: Cholesterol controlled with medications as prescribed, with individualized exercise RX and with personalized nutrition plan. Value goals: LDL < '70mg'$ , HDL > 40 mg.             Education:Diabetes - Individual verbal and written instruction to review signs/symptoms of diabetes, desired ranges of glucose level fasting, after meals and with exercise. Acknowledge that pre and post exercise glucose checks will be done for 3 sessions at entry of program. [facility]  Education need identified 10/24/22  Date 10/24/22  Educator KLRinardInstruction Review Code 1- Verbalizes Understanding       Core Components/Risk Factors/Patient Goals Review:    Core Components/Risk Factors/Patient Goals at Discharge (Final Review):    ITP Comments:  ITP Comments     Row Name 10/16/22 1004 10/24/22 1113 10/30/22 1711 11/06/22 0750 11/13/22 1644   ITP Comments Virtual orientation call completed today. he has an appointment on Date: 10/24/2022  for EP eval and gym Orientation.  [facility] . Completed 6MWT and gym orientation. Initial ITP created and sent for review to Dr. MaEmily FilbertMedical Director. First full day of exercise!  Patient was oriented to gym and equipment including functions, settings, policies, and procedures.  Patient's individual exercise prescription and treatment plan were reviewed.  All starting workloads were established based on the  results of the 6 minute walk test done at initial orientation visit.  The plan for exercise progression was also introduced and progression will be customized based on patient's performance and goals. 30 Day review completed. Medical Director ITP review done, changes made as directed, and signed approval by Medical Director.   New to program Called patient, hasn't attended cardiac rehab in the last week and a half. Patient states he got sick and is now in the hospital with fluid overload? Unable to see hospital notes at this time. Patient will call usKoreafter he gets discharged and will determine a return date/ possible clearance pending hospital visit diagnosis.    RoGideoname 11/20/22 1304 11/26/22 1547 12/03/22 1103 12/04/22 1000 12/23/22 1056   ITP Comments Casin called to let usKoreanow that [facility] a [facility]  He will remain out until he is feeling better. Patient called to let usKoreanow he is discharged from the hospital - had Rhinovirus. But still feeling some sickness and not up to his baseline. Patient will call usKoreaext week to let usKoreanow how he is doing. FrDamonteas not attended since 10/25/22.  He has been in the hospital and we were unable to assess his goal this go around. 30 Day review completed. Medical Director ITP review done, changes made as directed, and signed approval by Medical Director. Attempted to call patient again to receive any update on how patient is feeling. Left voicemail x2. Has not  attended rehab since 11/22. Will send letter at this time.    Pearl Name 01/01/23 0911           ITP Comments 30 Day review completed. Medical Director ITP review done, changes made as directed, and signed approval by Medical Director.   still no visits since 11/22                Comments:

## 2023-01-03 DIAGNOSIS — I639 Cerebral infarction, unspecified: Secondary | ICD-10-CM | POA: Insufficient documentation

## 2023-01-03 DIAGNOSIS — Z7729 Contact with and (suspected ) exposure to other hazardous substances: Secondary | ICD-10-CM | POA: Insufficient documentation

## 2023-01-03 DIAGNOSIS — Z719 Counseling, unspecified: Secondary | ICD-10-CM | POA: Insufficient documentation

## 2023-01-03 DIAGNOSIS — L259 Unspecified contact dermatitis, unspecified cause: Secondary | ICD-10-CM | POA: Insufficient documentation

## 2023-01-03 DIAGNOSIS — I350 Nonrheumatic aortic (valve) stenosis: Secondary | ICD-10-CM | POA: Insufficient documentation

## 2023-01-03 DIAGNOSIS — E1140 Type 2 diabetes mellitus with diabetic neuropathy, unspecified: Secondary | ICD-10-CM | POA: Insufficient documentation

## 2023-01-03 DIAGNOSIS — B001 Herpesviral vesicular dermatitis: Secondary | ICD-10-CM | POA: Insufficient documentation

## 2023-01-03 DIAGNOSIS — E1165 Type 2 diabetes mellitus with hyperglycemia: Secondary | ICD-10-CM | POA: Insufficient documentation

## 2023-01-03 DIAGNOSIS — R269 Unspecified abnormalities of gait and mobility: Secondary | ICD-10-CM | POA: Insufficient documentation

## 2023-01-03 DIAGNOSIS — L03116 Cellulitis of left lower limb: Secondary | ICD-10-CM | POA: Insufficient documentation

## 2023-01-03 DIAGNOSIS — I060 Rheumatic aortic stenosis: Secondary | ICD-10-CM | POA: Insufficient documentation

## 2023-01-03 DIAGNOSIS — H6060 Unspecified chronic otitis externa, unspecified ear: Secondary | ICD-10-CM | POA: Insufficient documentation

## 2023-01-03 DIAGNOSIS — I5031 Acute diastolic (congestive) heart failure: Secondary | ICD-10-CM | POA: Insufficient documentation

## 2023-01-03 DIAGNOSIS — B354 Tinea corporis: Secondary | ICD-10-CM | POA: Insufficient documentation

## 2023-01-03 DIAGNOSIS — R531 Weakness: Secondary | ICD-10-CM | POA: Insufficient documentation

## 2023-01-03 DIAGNOSIS — L233 Allergic contact dermatitis due to drugs in contact with skin: Secondary | ICD-10-CM | POA: Insufficient documentation

## 2023-01-03 DIAGNOSIS — R948 Abnormal results of function studies of other organs and systems: Secondary | ICD-10-CM | POA: Insufficient documentation

## 2023-01-03 DIAGNOSIS — Z7181 Spiritual or religious counseling: Secondary | ICD-10-CM | POA: Insufficient documentation

## 2023-01-03 DIAGNOSIS — Z135 Encounter for screening for eye and ear disorders: Secondary | ICD-10-CM | POA: Insufficient documentation

## 2023-01-03 DIAGNOSIS — Z5189 Encounter for other specified aftercare: Secondary | ICD-10-CM | POA: Insufficient documentation

## 2023-01-03 DIAGNOSIS — Z659 Problem related to unspecified psychosocial circumstances: Secondary | ICD-10-CM | POA: Insufficient documentation

## 2023-01-03 DIAGNOSIS — I5032 Chronic diastolic (congestive) heart failure: Secondary | ICD-10-CM | POA: Insufficient documentation

## 2023-01-03 DIAGNOSIS — I5041 Acute combined systolic (congestive) and diastolic (congestive) heart failure: Secondary | ICD-10-CM | POA: Insufficient documentation

## 2023-01-03 DIAGNOSIS — Z7901 Long term (current) use of anticoagulants: Secondary | ICD-10-CM | POA: Insufficient documentation

## 2023-01-03 DIAGNOSIS — M109 Gout, unspecified: Secondary | ICD-10-CM | POA: Insufficient documentation

## 2023-01-03 DIAGNOSIS — Z452 Encounter for adjustment and management of vascular access device: Secondary | ICD-10-CM | POA: Insufficient documentation

## 2023-01-03 DIAGNOSIS — L244 Irritant contact dermatitis due to drugs in contact with skin: Secondary | ICD-10-CM | POA: Insufficient documentation

## 2023-01-03 DIAGNOSIS — M6281 Muscle weakness (generalized): Secondary | ICD-10-CM | POA: Insufficient documentation

## 2023-01-03 DIAGNOSIS — Z23 Encounter for immunization: Secondary | ICD-10-CM | POA: Insufficient documentation

## 2023-01-03 DIAGNOSIS — M1710 Unilateral primary osteoarthritis, unspecified knee: Secondary | ICD-10-CM | POA: Insufficient documentation

## 2023-01-03 DIAGNOSIS — I4891 Unspecified atrial fibrillation: Secondary | ICD-10-CM | POA: Insufficient documentation

## 2023-01-03 DIAGNOSIS — D485 Neoplasm of uncertain behavior of skin: Secondary | ICD-10-CM | POA: Insufficient documentation

## 2023-01-03 DIAGNOSIS — I420 Dilated cardiomyopathy: Secondary | ICD-10-CM | POA: Insufficient documentation

## 2023-01-03 DIAGNOSIS — I509 Heart failure, unspecified: Secondary | ICD-10-CM | POA: Insufficient documentation

## 2023-01-03 HISTORY — DX: Cerebral infarction, unspecified: I63.9

## 2023-01-05 ENCOUNTER — Encounter: Payer: Self-pay | Admitting: Family Medicine

## 2023-01-06 ENCOUNTER — Ambulatory Visit (INDEPENDENT_AMBULATORY_CARE_PROVIDER_SITE_OTHER): Payer: Medicare HMO

## 2023-01-06 ENCOUNTER — Encounter: Payer: Self-pay | Admitting: *Deleted

## 2023-01-06 ENCOUNTER — Ambulatory Visit: Payer: Medicare HMO

## 2023-01-06 VITALS — BP 102/60 | Ht 66.0 in | Wt 227.8 lb

## 2023-01-06 DIAGNOSIS — Z0000 Encounter for general adult medical examination without abnormal findings: Secondary | ICD-10-CM | POA: Diagnosis not present

## 2023-01-06 DIAGNOSIS — Z952 Presence of prosthetic heart valve: Secondary | ICD-10-CM

## 2023-01-06 NOTE — Progress Notes (Signed)
Subjective:   Sean Hayden is a 77 y.o. male who presents for Medicare Annual/Subsequent preventive examination.  Review of Systems     Cardiac Risk Factors include: advanced age (>70mn, >>86women);diabetes mellitus;male gender;sedentary lifestyle;hypertension     Objective:    Today's Vitals   01/06/23 1023  BP: 102/60  Weight: 227 lb 12.8 oz (103.3 kg)  Height: '5\' 6"'$  (1.676 m)   Body mass index is 36.77 kg/m.     01/06/2023   10:33 AM 10/16/2022   10:19 AM 01/03/2022   10:44 AM 12/28/2020   10:41 AM 12/27/2019   11:06 AM 12/23/2018    8:54 AM 12/04/2017    2:14 PM  Advanced Directives  Does Patient Have a Medical Advance Directive? No No No Yes Yes Yes Yes  Type of AScientist, research (medical)Living will [place] Valley[place] will [place] will [place] will  Copy of [place] Chart?    No - copy requested No - copy requested No - copy requested No - copy requested  Would patient like information on creating a medical advance directive? No - Patient declined Yes (MAU/Ambulatory/Procedural Areas - Information given) No - Patient declined        Current Medications (verified) Outpatient Encounter Medications as of 01/06/2023  Medication Sig   acyclovir (ZOVIRAX) 400 MG tablet 1/2 tablet up to 5 times a day for cold sores   albuterol (VENTOLIN HFA) 108 (90 Base) MCG/ACT inhaler Inhale 2 puffs into the lungs every 6 (six) hours as needed for wheezing or shortness of breath.   atorvastatin (LIPITOR) 20 MG tablet Take 1 tablet (20 mg total) by mouth every evening. (Patient taking differently: Take 40 mg by mouth every evening.)   benzocaine (ORAJEL) 10 % mucosal gel Use as directed 1 application in the mouth or throat as needed for mouth pain.   Cholecalciferol (VITAMIN D PO) Take 2,000 Units by mouth daily.    Docosanol 10 % CREA Apply topically up to 5 times per  day as needed until heals   empagliflozin (JARDIANCE) 25 MG TABS tablet Take 25 mg by mouth daily.   Flaxseed, Linseed, (FLAXSEED OIL) 1000 MG CAPS Take 1,000 mg by mouth 2 (two) times daily.   fluticasone (FLONASE) 50 MCG/ACT nasal spray Place 2 sprays into both nostrils daily. (Patient taking differently: Place 2 sprays into both nostrils 2 (two) times daily.)   furosemide (LASIX) 20 MG tablet TAKE ONE (1) TABLET BY MOUTH ONCE DAILY AS NEEDED FOR EDEMA (TAKE 1 TABLET DAILYAS NEEDED FOR WEIGHT GAIN > 2 POUNDS IN 24 HOURS)   gabapentin (NEURONTIN) 100 MG capsule Take 1 capsule (100 mg total) by mouth 2 (two) times daily.   insulin detemir (LEVEMIR) 100 UNIT/ML injection Inject 0.3 mLs (30 Units total) into the skin 2 (two) times daily. (Patient taking differently: Inject 10 Units into the skin 2 (two) times daily.)   lisinopril (ZESTRIL) 2.5 MG tablet Take 5 mg by mouth daily.   metFORMIN (GLUCOPHAGE) 850 MG tablet Take 1 tablet (850 mg total) by mouth 2 (two) times daily with a meal.   metoprolol tartrate (LOPRESSOR) 25 MG tablet Take 0.5 tablets by mouth every 12 (twelve) hours.   Misc Natural Products (OSTEO BI-FLEX TRIPLE STRENGTH PO) Take 1 tablet by mouth 2 (two) times daily.    MISC NATURAL PRODUCTS PO Take 1 capsule by mouth in the morning and at bedtime.  Super Beta Prostate   Omega-3 Fatty Acids (FISH OIL) 1000 MG CPDR Take 1,200 mg by mouth 2 (two) times daily.    PARoxetine (PAXIL) 40 MG tablet TAKE 1 TABLET BY MOUTH DAILY.   rivaroxaban (XARELTO) 20 MG TABS tablet TAKE ONE TABLET BY MOUTH DAILY WITH SUPPER   cephALEXin (KEFLEX) 500 MG capsule TAKE ONE CAPSULE BY MOUTH FOUR TIMES A DAY FOR BACTERIAL INFECTION (Patient not taking: Reported on 01/06/2023)   levETIRAcetam (KEPPRA) 750 MG tablet Take 2 tablets (1,500 mg total) by mouth 2 (two) times daily. 3 in the am and 3 at bedtime (Patient not taking: Reported on 01/06/2023)   loratadine (CLARITIN) 10 MG tablet Take 10 mg by mouth daily.   (Patient not taking: Reported on 01/06/2023)   montelukast (SINGULAIR) 10 MG tablet TAKE ONE TABLET EVERY DAY FOR CHRONIC COUGH (Patient not taking: Reported on 01/06/2023)   tamsulosin (FLOMAX) 0.4 MG CAPS capsule Take 1 capsule by mouth daily. (Patient not taking: Reported on 01/06/2023)   zolpidem (AMBIEN) 10 MG tablet TAKE 1/2-1 TABLET BY MOUTH AT BEDTIME. (Patient not taking: Reported on 10/16/2022)   No facility-administered encounter medications on file as of 01/06/2023.    Allergies (verified) Pioglitazone   History: Past Medical History:  Diagnosis Date   Aortic stenosis    moderate by 02/2016 echo   Aortic valve stenosis, congenital    Moderate by echo 2017   B12 deficiency    Cancer (HCC)    skin cancer - face    CVA (cerebral vascular accident) ([place])    02/2016 no residual   Diabetes mellitus without complication ([place])    Gout    Hemorrhoids    History of kidney stones    Hyperlipidemia    Hyperplastic colon polyp    Hypertension    MRSA infection    Neuropathy    PAF (paroxysmal atrial fibrillation) ([place])    Personal history of urinary calculi 05/25/2015   Pneumonia    1990's   Seizures ([place])    Past Surgical History:  Procedure Laterality Date   Arm fracture Left    CAROTID ARTERY ANGIOPLASTY Right 04/17/2012   Dr. Delana Meyer, [place] Right 04/17/2012   Dr. Delana Meyer, [place]  2005   lap   COLONOSCOPY     CYST EXCISION     "between legs"   EP IMPLANTABLE DEVICE N/A 02/23/2016   Procedure: Loop Recorder Insertion;  Surgeon: Thompson Grayer, MD;  Location: [facility];  Service: Cardiovascular;  Laterality: N/A;   HEMORRHOID SURGERY  2009   INCISION AND DRAINAGE WOUND WITH NERVE AND ARTERY REPAIR Left 10/04/2017   Procedure: LEFT HAND WOUND EXPLORATION repair as necessary;  Surgeon: Iran Planas, MD;  Location: [facility];  Service: Orthopedics;  Laterality: Left;   MOHS SURGERY Right 12/24/2022   - Ishmael Holter, MD   TEE  WITHOUT CARDIOVERSION N/A 02/23/2016   Procedure: TRANSESOPHAGEAL ECHOCARDIOGRAM (TEE);  Surgeon: Pixie Casino, MD;  Location: [facility];  Service: Cardiovascular;  Laterality: N/A;  ALSO GETTING A LOOP   TRANSCATHETER AORTIC VALVE REPLACEMENT, TRANSFEMORAL  09/09/2022   Dr. Jimmye Norman at [facility]   Family History  Problem Relation Age of Onset   Hodgkin's lymphoma Father    Diabetes Mother    Social History   Socioeconomic History   Marital status: Married    Spouse name: Not on file   Number of children: 2   Years of education: HS Jeanella Anton  Highest education level: 12th grade  Occupational History   Occupation: Retired  Tobacco Use   Smoking status: Former    Types: Cigars    Quit date: 04/08/2012    Years since quitting: 10.7   Smokeless tobacco: Never   Tobacco comments:    cigarettes and cigars  all quit 12 years ago  Vaping Use   Vaping Use: Never used  Substance and Sexual Activity   Alcohol use: No    Alcohol/week: 0.0 standard drinks of alcohol    Comment: quit 2013   Drug use: No   Sexual activity: Not on file  Other Topics Concern   Not on file  Social History Narrative   Lives at home with his granddaughter.   Right-handed.   No caffeine use.       Social Determinants of Health   Financial Resource Strain: Low Risk  (01/06/2023)   Overall Financial Resource Strain (CARDIA)    Difficulty of Paying Living Expenses: Not hard at all  Food Insecurity: No Food Insecurity (01/06/2023)   Hunger Vital Sign    Worried About Running Out of Food in the Last Year: Never true    Ran Out of Food in the Last Year: Never true  Transportation Needs: No Transportation Needs (01/06/2023)   PRAPARE - Hydrologist (Medical): No    Lack of Transportation (Non-Medical): No  Physical Activity: Inactive (01/06/2023)   Exercise Vital Sign    Days of Exercise per Week: 0 days    Minutes of Exercise per Session: 0 min  Stress: No Stress Concern Present  (01/06/2023)   [place]    Feeling of Stress : Only a little  Social Connections: Moderately Integrated (01/06/2023)   Social Connection and Isolation Panel [NHANES]    Frequency of Communication with Friends and Family: More than three times a week    Frequency of Social Gatherings with Friends and Family: Once a week    Attends Religious Services: Never    Marine scientist or Organizations: Yes    Attends Music therapist: More than 4 times per year    Marital Status: Married    Tobacco Counseling Counseling given: Not Answered Tobacco comments: cigarettes and cigars  all quit 12 years ago   Clinical Intake:  Pre-visit preparation completed: Yes  Pain : No/denies pain     Nutritional Risks: None Diabetes: Yes CBG done?: No Did pt. bring in CBG monitor from home?: No  How often do you need to have someone help you when you read instructions, pamphlets, or other written materials from your doctor or pharmacy?: 1 - Never  Diabetic?yes Nutrition Risk Assessment:  Has the patient had any N/V/D within the last 2 months?  Yes  Does the patient have any non-healing wounds?  No  Has the patient had any unintentional weight loss or weight gain?  No   Diabetes:  Is the patient diabetic?  Yes  If diabetic, was a CBG obtained today?  No  Did the patient bring in their glucometer from home?  No  How often do you monitor your CBG's? Every 2-3 days.   Financial Strains and Diabetes Management:  Are you having any financial strains with the device, your supplies or your medication? No .  Does the patient want to be seen by Chronic Care Management for management of their diabetes?  No  Would the patient like to be referred  to a Nutritionist or for Diabetic Management?  No   Diabetic Exams:  Diabetic Eye Exam: Completed 02/14/21. Pt has been advised about the importance in completing this exam.    Diabetic Foot Exam: Completed 09/08/16. Pt has been advised about the importance in completing this exam.     Interpreter Needed?: No  Information entered by :: Kirke Shaggy, LPN   Activities of Daily Living    01/06/2023   10:35 AM  In your present state of health, do you have any difficulty performing the following activities:  Hearing? 1  Vision? 0  Difficulty concentrating or making decisions? 0  Walking or climbing stairs? 0  Dressing or bathing? 0  Doing errands, shopping? 0  Preparing Food and eating ? N  Using the Toilet? N  In the past six months, have you accidently leaked urine? N  Do you have problems with loss of bowel control? N  Managing your Medications? N  Managing your Finances? N  Housekeeping or managing your Housekeeping? N    Patient Care Team: Birdie Sons, MD as PCP - General (Family Medicine) Marcial Pacas, MD as Consulting Physician (Neurology) [facility], [place] [facility], [place] as Referring Physician ([place]) Germaine Pomfret, [facility] as Pharmacist (Pharmacist) Eulogio Bear, MD as Consulting Physician (Ophthalmology) Thompson Grayer, MD as Consulting Physician (Cardiology)  Indicate any recent Medical Services you may have received from other than [facility] providers in the past year (date may be approximate).     Assessment:   This is a routine wellness examination for Devan.  Hearing/Vision screen Hearing Screening - Comments:: No aids Vision Screening - Comments:: Wears glasses- V.A.   Dietary issues and exercise activities discussed: Current Exercise Habits: The patient does not participate in regular exercise at present   Goals Addressed             This Visit's Progress    DIET - INCREASE WATER INTAKE         Depression Screen    01/06/2023   10:32 AM 10/24/2022   11:15 AM 01/03/2022   10:42 AM 07/18/2021    9:08 AM 12/28/2020   10:36 AM 12/27/2019   11:07 AM 12/27/2019   11:03 AM   PHQ 2/9 Scores  PHQ - 2 Score 0 1 0 3 0 0 0  PHQ- 9 Score 0 4  12       Fall Risk    01/06/2023   10:34 AM 10/16/2022   10:16 AM 01/03/2022   10:45 AM 07/18/2021    9:10 AM 12/28/2020   10:41 AM  Fall Risk   Falls in the past year? 0 '1 1 1 1  '$ Number falls in past yr: 0 0 '1 1 1  '$ Injury with Fall? 0 0 0 1 0  Risk for fall due to : No Fall Risks Medication side effect History of fall(s) History of fall(s)   Follow up Falls prevention discussed;Falls evaluation completed Education provided;Falls prevention discussed Falls prevention discussed Falls prevention discussed Falls prevention discussed    FALL RISK PREVENTION PERTAINING TO THE HOME:  Any stairs in or around the home? Yes  If so, are there any without handrails? No  Home free of loose throw rugs in walkways, pet beds, electrical cords, etc? Yes  Adequate lighting in your home to reduce risk of falls? Yes   ASSISTIVE DEVICES UTILIZED TO PREVENT FALLS:  Life alert? No  Use of a cane, walker or w/c? No  Grab bars in the bathroom? No  Shower chair or bench in shower? No  Elevated toilet seat or a handicapped toilet? No   TIMED UP AND GO:  Was the test performed? Yes .  Length of time to ambulate 10 feet: 5 sec.   Gait slow and steady without use of assistive device  Cognitive Function:        01/06/2023   10:40 AM 12/06/2016   10:09 AM  6CIT Screen  What Year? 0 points 4 points  What month? 0 points 0 points  What time? 3 points 0 points  Count back from 20 0 points 0 points  Months in reverse 2 points 0 points  Repeat phrase 2 points 2 points  Total Score 7 points 6 points    Immunizations Immunization History  Administered Date(s) Administered   COVID-19, mRNA, vaccine(Comirnaty)12 years and older 12/26/2022   Fluad Quad(high Dose 65+) 09/28/2019, 10/11/2020, 10/17/2021, 09/11/2022   Influenza, High Dose Seasonal PF 08/25/2015, 09/13/2016, 08/01/2017, 08/19/2018   Influenza, Seasonal, Injecte,  Preservative Fre 09/23/2014   Influenza-Unspecified 08/09/2013, 09/23/2014, 09/20/2015, 08/08/2017, 08/10/2019, 10/17/2021   PFIZER(Purple Top)SARS-COV-2 Vaccination 02/08/2020, 02/29/2020, 10/03/2020   Pneumococcal Conjugate-13 10/17/2014, 11/01/2014   Pneumococcal Polysaccharide-23 11/15/2008, 04/27/2012, 10/08/2013   Pneumococcal-Unspecified 07/09/2017   Tdap 04/17/2010, 02/14/2011, 05/09/2013, 07/23/2013, 09/15/2017   Zoster Recombinat (Shingrix) 08/20/2021, 02/14/2022   Zoster, Live 09/23/2014    TDAP status: Up to date  Flu Vaccine status: Up to date  Pneumococcal vaccine status: Up to date  Covid-19 vaccine status: Completed vaccines  Qualifies for Shingles Vaccine? Yes   Zostavax completed Yes   Shingrix Completed?: No.    Education has been provided regarding the importance of this vaccine. Patient has been advised to call insurance company to determine out of pocket expense if they have not yet received this vaccine. Advised may also receive vaccine at local pharmacy or Health Dept. Verbalized acceptance and understanding.  Screening Tests Health Maintenance  Topic Date Due   Lung Cancer Screening  Never done   FOOT EXAM  09/08/2017   OPHTHALMOLOGY EXAM  02/14/2022   HEMOGLOBIN A1C  11/19/2022   Diabetic kidney evaluation - eGFR measurement  01/25/2023   Diabetic kidney evaluation - Urine ACR  01/25/2023   COVID-19 Vaccine (5 - 2023-24 season) 02/20/2023   Medicare Annual Wellness (AWV)  01/07/2024   DTaP/Tdap/Td (6 - Td or Tdap) 09/16/2027   Pneumonia Vaccine 24+ Years old  Completed   INFLUENZA VACCINE  Completed   Hepatitis C Screening  Completed   Zoster Vaccines- Shingrix  Completed   HPV VACCINES  Aged Out   Fecal DNA (Cologuard)  Discontinued    Health Maintenance  Health Maintenance Due  Topic Date Due   Lung Cancer Screening  Never done   FOOT EXAM  09/08/2017   OPHTHALMOLOGY EXAM  02/14/2022   HEMOGLOBIN A1C  11/19/2022   Diabetic kidney  evaluation - eGFR measurement  01/25/2023   Diabetic kidney evaluation - Urine ACR  01/25/2023    Colorectal cancer screening: No longer required.   Lung Cancer Screening: (Low Dose CT Chest recommended if Age 57-80 years, 30 pack-year currently smoking OR have quit w/in 15years.) does not qualify.   Additional Screening:  Hepatitis C Screening: does qualify; Completed 02/21/16  Vision Screening: Recommended annual ophthalmology exams for early detection of glaucoma and other disorders of the eye. Is the patient up to date with their annual eye exam?  Yes  Who is the provider or what is the  name of the office in which the patient attends annual eye exams? V.A. If pt is not established with a provider, would they like to be referred to a provider to establish care? No .   Dental Screening: Recommended annual dental exams for proper oral hygiene  Community Resource Referral / Chronic Care Management: CRR required this visit?  No   CCM required this visit?  No      Plan:     I have personally reviewed and noted the following in the patient's chart:   Medical and social history Use of alcohol, tobacco or illicit drugs  Current medications and supplements including opioid prescriptions. Patient is not currently taking opioid prescriptions. Functional ability and status Nutritional status Physical activity Advanced directives List of other physicians Hospitalizations, surgeries, and ER visits in previous 12 months Vitals Screenings to include cognitive, depression, and falls Referrals and appointments  In addition, I have reviewed and discussed with patient certain preventive protocols, quality metrics, and best practice recommendations. A written personalized care plan for preventive services as well as general preventive health recommendations were provided to patient.     Dionisio David, LPN   [id number]/02/[id number]   Nurse Notes: none

## 2023-01-06 NOTE — Progress Notes (Signed)
Discharge Summary: Sean Hayden [birth year]   [name] discharged today from  rehab with 3 sessions completed.  Pt has not attended since 10/30/22.    6 Minute Walk     Row Name 10/24/22 1128         6 Minute Walk   Phase Initial     Distance 550 feet     Walk Time 6 minutes     # of Rest Breaks 0     MPH 1.04     METS 0.84     RPE 16     Perceived Dyspnea  1     VO2 Peak 2.96     Symptoms Yes (comment)     Comments Fatigue     Resting HR 90 bpm     Resting BP 118/72     Resting Oxygen Saturation  94 %     Exercise Oxygen Saturation  during 6 min walk 90 %     Max Ex. HR 120 bpm     Max Ex. BP 126/68     2 Minute Post BP 120/74

## 2023-01-06 NOTE — Progress Notes (Signed)
Cardiac Individual Treatment Plan  Patient Details  Name: Sean Hayden MRN: [id number] Date of Birth: 05-May-1946 Referring Provider:   Flowsheet Row Cardiac Rehab from 10/24/2022 in [facility] Cardiac and Pulmonary Rehab  Referring Provider Lelon Huh MD       Initial Encounter Date:  Flowsheet Row Cardiac Rehab from 10/24/2022 in [facility] Cardiac and Pulmonary Rehab  Date 10/24/22       Visit Diagnosis: S/P TAVR (transcatheter aortic valve replacement)  Patient's Home Medications on Admission:  Current Outpatient Medications:    acyclovir (ZOVIRAX) 400 MG tablet, 1/2 tablet up to 5 times a day for cold sores, Disp: 30 tablet, Rfl: 5   albuterol (VENTOLIN HFA) 108 (90 Base) MCG/ACT inhaler, Inhale 2 puffs into the lungs every 6 (six) hours as needed for wheezing or shortness of breath., Disp: , Rfl:    atorvastatin (LIPITOR) 20 MG tablet, Take 1 tablet (20 mg total) by mouth every evening. (Patient taking differently: Take 40 mg by mouth every evening.), Disp: 90 tablet, Rfl: 3   benzocaine (ORAJEL) 10 % mucosal gel, Use as directed 1 application in the mouth or throat as needed for mouth pain., Disp: , Rfl:    cephALEXin (KEFLEX) 500 MG capsule, TAKE ONE CAPSULE BY MOUTH FOUR TIMES A DAY FOR BACTERIAL INFECTION (Patient not taking: Reported on 01/06/2023), Disp: , Rfl:    Cholecalciferol (VITAMIN D PO), Take 2,000 Units by mouth daily. , Disp: , Rfl:    Docosanol 10 % CREA, Apply topically up to 5 times per day as needed until heals, Disp: 2 g, Rfl: 3   empagliflozin (JARDIANCE) 25 MG TABS tablet, Take 25 mg by mouth daily., Disp: 90 tablet, Rfl: 4   Flaxseed, Linseed, (FLAXSEED OIL) 1000 MG CAPS, Take 1,000 mg by mouth 2 (two) times daily., Disp: , Rfl:    fluticasone (FLONASE) 50 MCG/ACT nasal spray, Place 2 sprays into both nostrils daily. (Patient taking differently: Place 2 sprays into both nostrils 2 (two) times daily.), Disp: 16 g, Rfl: 1   furosemide (LASIX) 20 MG tablet, TAKE  ONE (1) TABLET BY MOUTH ONCE DAILY AS NEEDED FOR EDEMA (TAKE 1 TABLET DAILYAS NEEDED FOR WEIGHT GAIN > 2 POUNDS IN 24 HOURS), Disp: 30 tablet, Rfl: 1   gabapentin (NEURONTIN) 100 MG capsule, Take 1 capsule (100 mg total) by mouth 2 (two) times daily., Disp: 180 capsule, Rfl: 1   insulin detemir (LEVEMIR) 100 UNIT/ML injection, Inject 0.3 mLs (30 Units total) into the skin 2 (two) times daily. (Patient taking differently: Inject 10 Units into the skin 2 (two) times daily.), Disp: 100 mL, Rfl: 1   levETIRAcetam (KEPPRA) 750 MG tablet, Take 2 tablets (1,500 mg total) by mouth 2 (two) times daily. 3 in the am and 3 at bedtime (Patient not taking: Reported on 01/06/2023), Disp: 360 tablet, Rfl: 4   lisinopril (ZESTRIL) 2.5 MG tablet, Take 5 mg by mouth daily., Disp: , Rfl:    loratadine (CLARITIN) 10 MG tablet, Take 10 mg by mouth daily.  (Patient not taking: Reported on 01/06/2023), Disp: , Rfl:    metFORMIN (GLUCOPHAGE) 850 MG tablet, Take 1 tablet (850 mg total) by mouth 2 (two) times daily with a meal., Disp: 180 tablet, Rfl: 1   metoprolol tartrate (LOPRESSOR) 25 MG tablet, Take 0.5 tablets by mouth every 12 (twelve) hours., Disp: , Rfl:    Misc Natural Products (OSTEO BI-FLEX TRIPLE STRENGTH PO), Take 1 tablet by mouth 2 (two) times daily. , Disp: , Rfl:  MISC NATURAL PRODUCTS PO, Take 1 capsule by mouth in the morning and at bedtime. Super Beta Prostate, Disp: , Rfl:    montelukast (SINGULAIR) 10 MG tablet, TAKE ONE TABLET EVERY DAY FOR CHRONIC COUGH (Patient not taking: Reported on 01/06/2023), Disp: 30 tablet, Rfl: 12   Omega-3 Fatty Acids (FISH OIL) 1000 MG CPDR, Take 1,200 mg by mouth 2 (two) times daily. , Disp: , Rfl:    PARoxetine (PAXIL) 40 MG tablet, TAKE 1 TABLET BY MOUTH DAILY., Disp: 30 tablet, Rfl: 0   rivaroxaban (XARELTO) 20 MG TABS tablet, TAKE ONE TABLET BY MOUTH DAILY WITH SUPPER, Disp: 30 tablet, Rfl: 12   tamsulosin (FLOMAX) 0.4 MG CAPS capsule, Take 1 capsule by mouth daily.  (Patient not taking: Reported on 01/06/2023), Disp: , Rfl:    zolpidem (AMBIEN) 10 MG tablet, TAKE 1/2-1 TABLET BY MOUTH AT BEDTIME. (Patient not taking: Reported on 10/16/2022), Disp: 90 tablet, Rfl: 3  Past Medical History: Past Medical History:  Diagnosis Date   Aortic stenosis    moderate by 02/2016 echo   Aortic valve stenosis, congenital    Moderate by echo 2017   B12 deficiency    Cancer (HCC)    skin cancer - face    CVA (cerebral vascular accident) ([place])    02/2016 no residual   Diabetes mellitus without complication ([place])    Gout    Hemorrhoids    History of kidney stones    Hyperlipidemia    Hyperplastic colon polyp    Hypertension    MRSA infection    Neuropathy    PAF (paroxysmal atrial fibrillation) ([place])    Personal history of urinary calculi 05/25/2015   Pneumonia    1990's   Seizures ([place])     Tobacco Use: Social History   Tobacco Use  Smoking Status Former   Types: Cigars   Quit date: 04/08/2012   Years since quitting: 10.7  Smokeless Tobacco Never  Tobacco Comments   cigarettes and cigars  all quit 12 years ago    Labs: Review Flowsheet  More data exists      Latest Ref Rng & Units 02/07/2020 05/16/2020 07/18/2021 01/25/2022 05/20/2022  Labs for ITP Cardiac and Pulmonary Rehab  Cholestrol 100 - 199 mg/dL 123     - 122  111  -  LDL (calc) 0 - 99 mg/dL 49     - 51  45  -  HDL-C >39 mg/dL 37     - 34  33  -  Trlycerides 0 - 149 mg/dL 184     - 231  200  -  Hemoglobin A1c 4.0 - 5.6 % 7.7     7.6  8.9  8.5  7.6     Details       This result is from an external source.          Exercise Target Goals: Exercise Program Goal: Individual exercise prescription set using results from initial 6 min walk test and THRR while considering  patient's activity barriers and safety.   Exercise Prescription Goal: Initial exercise prescription builds to 30-45 minutes a day of aerobic activity, 2-3 days per week.  Home exercise guidelines will be given to patient  during program as part of exercise prescription that the participant will acknowledge.   Education: Aerobic Exercise: - Group verbal and visual presentation on the components of exercise prescription. Introduces F.I.T.T principle from ACSM for exercise prescriptions.  Reviews F.I.T.T. principles of aerobic exercise including progression. Written material  given at graduation. Flowsheet Row Cardiac Rehab from 10/24/2022 in [facility] Cardiac and Pulmonary Rehab  Education need identified 10/24/22       Education: Resistance Exercise: - Group verbal and visual presentation on the components of exercise prescription. Introduces F.I.T.T principle from ACSM for exercise prescriptions  Reviews F.I.T.T. principles of resistance exercise including progression. Written material given at graduation.    Education: Exercise & Equipment Safety: - Individual verbal instruction and demonstration of equipment use and safety with use of the equipment. Flowsheet Row Cardiac Rehab from 10/24/2022 in [facility] Cardiac and Pulmonary Rehab  Education need identified 10/24/22  Date 10/24/22  Educator [place]  Instruction Review Code 1- Verbalizes Understanding       Education: Exercise Physiology & General Exercise Guidelines: - Group verbal and written instruction with models to review the exercise physiology of the cardiovascular system and associated critical values. Provides general exercise guidelines with specific guidelines to those with heart or lung disease.  Flowsheet Row Cardiac Rehab from 10/24/2022 in [facility] Cardiac and Pulmonary Rehab  Education need identified 10/24/22       Education: Flexibility, Balance, Mind/Body Relaxation: - Group verbal and visual presentation with interactive activity on the components of exercise prescription. Introduces F.I.T.T principle from ACSM for exercise prescriptions. Reviews F.I.T.T. principles of flexibility and balance exercise training including progression. Also  discusses the mind body connection.  Reviews various relaxation techniques to help reduce and manage stress (i.e. Deep breathing, progressive muscle relaxation, and visualization). Balance handout provided to take home. Written material given at graduation.   Activity Barriers & Risk Stratification:  Activity Barriers & Cardiac Risk Stratification - 10/24/22 1123       Activity Barriers & Cardiac Risk Stratification   Activity Barriers Deconditioning;Muscular Weakness;Shortness of Breath;Arthritis;Other (comment);Decreased Ventricular Function    Comments Seizures, resulting in hand numbness    Cardiac Risk Stratification High             6 Minute Walk:  6 Minute Walk     Row Name 10/24/22 1128         6 Minute Walk   Phase Initial     Distance 550 feet     Walk Time 6 minutes     # of Rest Breaks 0     MPH 1.04     METS 0.84     RPE 16     Perceived Dyspnea  1     VO2 Peak 2.96     Symptoms Yes (comment)     Comments Fatigue     Resting HR 90 bpm     Resting BP 118/72     Resting Oxygen Saturation  94 %     Exercise Oxygen Saturation  during 6 min walk 90 %     Max Ex. HR 120 bpm     Max Ex. BP 126/68     2 Minute Post BP 120/74              Oxygen Initial Assessment:   Oxygen Re-Evaluation:   Oxygen Discharge (Final Oxygen Re-Evaluation):   Initial Exercise Prescription:  Initial Exercise Prescription - 10/24/22 1100       Date of Initial Exercise RX and Referring Provider   Date 10/24/22    Referring Provider Lelon Huh MD      Oxygen   Maintain Oxygen Saturation 88% or higher      Recumbant Bike   Level 1    RPM 60    Watts 10  Minutes 15    METs 1      NuStep   Level 1    SPM 80    Minutes 15    METs 1      T5 Nustep   Level 1    SPM 80    Minutes 15    METs 1      Track   Laps 8    Minutes 15    METs 1.44      Prescription Details   Frequency (times per week) 2    Duration Progress to 30 minutes of continuous  aerobic without signs/symptoms of physical distress      Intensity   THRR 40-80% of Max Heartrate 111 - 133    Ratings of Perceived Exertion 11-13    Perceived Dyspnea 0-4      Progression   Progression Continue to progress workloads to maintain intensity without signs/symptoms of physical distress.      Resistance Training   Training Prescription Yes    Weight 2 lb    Reps 10-15             Perform Capillary Blood Glucose checks as needed.  Exercise Prescription Changes:   Exercise Prescription Changes     Row Name 10/24/22 1100 11/04/22 1500           Response to Exercise   Blood Pressure (Admit) 118/72 108/70      Blood Pressure (Exercise) 126/68 120/72      Blood Pressure (Exit) 120/74 106/66      Heart Rate (Admit) 90 bpm 92 bpm      Heart Rate (Exercise) 120 bpm 112 bpm      Heart Rate (Exit) 87 bpm 99 bpm      Oxygen Saturation (Admit) 94 % --      Oxygen Saturation (Exercise) 90 % --      Oxygen Saturation (Exit) 94 % --      Rating of Perceived Exertion (Exercise) 16 15      Perceived Dyspnea (Exercise) 1 --      Symptoms Fatigue none      Comments walk test results 2nd full day of exercise      Duration -- Progress to 30 minutes of  aerobic without signs/symptoms of physical distress      Intensity -- THRR unchanged        Progression   Progression -- Continue to progress workloads to maintain intensity without signs/symptoms of physical distress.      Average METs -- 2.01        Resistance Training   Training Prescription -- Yes      Weight -- 2 lb      Reps -- 10-15        Interval Training   Interval Training -- No        NuStep   Level -- 1      Minutes -- 15      METs -- 2        Track   Laps -- 14      Minutes -- 15      METs -- 1.76        Oxygen   Maintain Oxygen Saturation -- 88% or higher               Exercise Comments:   Exercise Comments     Row Name 10/30/22 1711           Exercise Comments First full day  of exercise!  Patient was oriented to gym and equipment including functions, settings, policies, and procedures.  Patient's individual exercise prescription and treatment plan were reviewed.  All starting workloads were established based on the results of the 6 minute walk test done at initial orientation visit.  The plan for exercise progression was also introduced and progression will be customized based on patient's performance and goals.                Exercise Goals and Review:   Exercise Goals     Row Name 10/24/22 1141             Exercise Goals   Increase Physical Activity Yes       Intervention Provide advice, education, support and counseling about physical activity/exercise needs.;Develop an individualized exercise prescription for aerobic and resistive training based on initial evaluation findings, risk stratification, comorbidities and participant's personal goals.       Expected Outcomes Short Term: Attend rehab on a regular basis to increase amount of physical activity.;Long Term: Add in home exercise to make exercise part of routine and to increase amount of physical activity.;Long Term: Exercising regularly at least 3-5 days a week.       Increase Strength and Stamina Yes       Intervention Provide advice, education, support and counseling about physical activity/exercise needs.;Develop an individualized exercise prescription for aerobic and resistive training based on initial evaluation findings, risk stratification, comorbidities and participant's personal goals.       Expected Outcomes Short Term: Increase workloads from initial exercise prescription for resistance, speed, and METs.;Short Term: Perform resistance training exercises routinely during rehab and add in resistance training at home;Long Term: Improve cardiorespiratory fitness, muscular endurance and strength as measured by increased METs and functional capacity (6MWT)       Able to understand and use rate of  perceived exertion (RPE) scale Yes       Intervention Provide education and explanation on how to use RPE scale       Expected Outcomes Short Term: Able to use RPE daily in rehab to express subjective intensity level;Long Term:  Able to use RPE to guide intensity level when exercising independently       Able to understand and use Dyspnea scale Yes       Intervention Provide education and explanation on how to use Dyspnea scale       Expected Outcomes Short Term: Able to use Dyspnea scale daily in rehab to express subjective sense of shortness of breath during exertion;Long Term: Able to use Dyspnea scale to guide intensity level when exercising independently       Knowledge and understanding of Target Heart Rate Range (THRR) Yes       Intervention Provide education and explanation of THRR including how the numbers were predicted and where they are located for reference       Expected Outcomes Short Term: Able to state/look up THRR;Long Term: Able to use THRR to govern intensity when exercising independently;Short Term: Able to use daily as guideline for intensity in rehab       Able to check pulse independently Yes       Intervention Provide education and demonstration on how to check pulse in carotid and radial arteries.;Review the importance of being able to check your own pulse for safety during independent exercise       Expected Outcomes Short Term: Able to explain why pulse checking is important during independent exercise;Long Term: Able  to check pulse independently and accurately       Understanding of Exercise Prescription Yes       Intervention Provide education, explanation, and written materials on patient's individual exercise prescription       Expected Outcomes Short Term: Able to explain program exercise prescription;Long Term: Able to explain home exercise prescription to exercise independently                Exercise Goals Re-Evaluation :  Exercise Goals Re-Evaluation      Row Name 10/30/22 1712 11/04/22 1535 11/21/22 1423 12/05/22 1201 12/17/22 1358     Exercise Goal Re-Evaluation   Exercise Goals Review Increase Physical Activity;Able to understand and use rate of perceived exertion (RPE) scale;Knowledge and understanding of Target Heart Rate Range (THRR);Understanding of Exercise Prescription;Increase Strength and Stamina;Able to check pulse independently Increase Physical Activity;Increase Strength and Stamina;Understanding of Exercise Prescription Increase Physical Activity;Increase Strength and Stamina;Understanding of Exercise Prescription Increase Physical Activity;Increase Strength and Stamina;Understanding of Exercise Prescription Increase Physical Activity;Increase Strength and Stamina;Understanding of Exercise Prescription   Comments Reviewed RPE scale, THR and program prescription with pt today.  Pt voiced understanding and was given a copy of goals to take home. Sinjin is off to a good start with rehab for the first couple of sessions he has been here. He was able to walk a full 14 laps on the track! We will continue to monitor as he progresses further in the program. Pamela has not attended rehab since the last review. We will reach out to him and remind him of the importance of attending rehab consistently for the greatest benefit. We will continue to monitor when he returns. Javelle has been recently discharged from the hospital, having rhinovirus. He did let us know that he was still wasn't feeling the best and will let us know when he feels ready to come back. Will continue to update with patient. Donoven continues to be out of rehab, after being discharged from the hospital from having rhinovirus. He did let us know that he still wasn't feeling the best and will let us know when he feels ready to come back. We will continue to update with patient.   Expected Outcomes Short: Use RPE daily to regulate intensity.  Long: Follow program prescription in THR. Short:  continue to follow initial exercise prescription Long: Continue to build up overall strength and stamina Short: Return to regular attendance. Long: Continue to increase strength and stamina. Short: Return to rehab when feeling better Long: Continue to increase overall MET level Short: Return to rehab when feeling better Long: Continue to improve strength and stamina.    [place] Name 01/01/23 1120             Exercise Goal Re-Evaluation   Exercise Goals Review Increase Physical Activity;Increase Strength and Stamina;Understanding of Exercise Prescription       Comments Durrell has not returned to rehab. We have attempted to contact patient but have not received a response, and therfore, we have sent him a discharge letter. If we do not hear back, we will discharge patient.       Expected Outcomes Short: Return to rehab Long: Graduate from [facility] program                Discharge Exercise Prescription (Final Exercise Prescription Changes):  Exercise Prescription Changes - 11/04/22 1500       Response to Exercise   Blood Pressure (Admit) 108/70    Blood Pressure (Exercise) 120/72  Blood Pressure (Exit) 106/66    Heart Rate (Admit) 92 bpm    Heart Rate (Exercise) 112 bpm    Heart Rate (Exit) 99 bpm    Rating of Perceived Exertion (Exercise) 15    Symptoms none    Comments 2nd full day of exercise    Duration Progress to 30 minutes of  aerobic without signs/symptoms of physical distress    Intensity THRR unchanged      Progression   Progression Continue to progress workloads to maintain intensity without signs/symptoms of physical distress.    Average METs 2.01      Resistance Training   Training Prescription Yes    Weight 2 lb    Reps 10-15      Interval Training   Interval Training No      NuStep   Level 1    Minutes 15    METs 2      Track   Laps 14    Minutes 15    METs 1.76      Oxygen   Maintain Oxygen Saturation 88% or higher             Nutrition:   Target Goals: Understanding of nutrition guidelines, daily intake of sodium '1500mg'$ , cholesterol '200mg'$ , calories 30% from fat and 7% or less from saturated fats, daily to have 5 or more servings of fruits and vegetables.  Education: All About Nutrition: -Group instruction provided by verbal, written material, interactive activities, discussions, models, and posters to present general guidelines for heart healthy nutrition including fat, fiber, MyPlate, the role of sodium in heart healthy nutrition, utilization of the nutrition label, and utilization of this knowledge for meal planning. Follow up email sent as well. Written material given at graduation. Flowsheet Row Cardiac Rehab from 10/24/2022 in [facility] Cardiac and Pulmonary Rehab  Education need identified 10/24/22       Biometrics:  Pre Biometrics - 10/24/22 1128       Pre Biometrics   Height 5' 6.75" (1.695 m)    Weight 236 lb 1.6 oz (107.1 kg)    BMI (Calculated) 37.28    Single Leg Stand 0 seconds              Nutrition Therapy Plan and Nutrition Goals:  Nutrition Therapy & Goals - 10/24/22 1117       Intervention Plan   Intervention Prescribe, educate and counsel regarding individualized specific dietary modifications aiming towards targeted core components such as weight, hypertension, lipid management, diabetes, heart failure and other comorbidities.    Expected Outcomes Short Term Goal: Understand basic principles of dietary content, such as calories, fat, sodium, cholesterol and nutrients.;Short Term Goal: A plan has been developed with personal nutrition goals set during dietitian appointment.;Long Term Goal: Adherence to prescribed nutrition plan.             Nutrition Assessments:  MEDIFICTS Score Key: ?70 Need to make dietary changes  40-70 Heart Healthy Diet ? 40 Therapeutic Level Cholesterol Diet  Flowsheet Row Cardiac Rehab from 10/24/2022 in [facility] Cardiac and Pulmonary Rehab  Picture Your Plate  Total Score on Admission 51      Picture Your Plate Scores: <01 Unhealthy dietary pattern with much room for improvement. 41-50 Dietary pattern unlikely to meet recommendations for good health and room for improvement. 51-60 More healthful dietary pattern, with some room for improvement.  >60 Healthy dietary pattern, although there may be some specific behaviors that could be improved.    Nutrition Goals Re-Evaluation:  Nutrition Goals Discharge (Final Nutrition Goals Re-Evaluation):   Psychosocial: Target Goals: Acknowledge presence or absence of significant depression and/or stress, maximize coping skills, provide positive support system. Participant is able to verbalize types and ability to use techniques and skills needed for reducing stress and depression.   Education: Stress, Anxiety, and Depression - Group verbal and visual presentation to define topics covered.  Reviews how body is impacted by stress, anxiety, and depression.  Also discusses healthy ways to reduce stress and to treat/manage anxiety and depression.  Written material given at graduation.   Education: Sleep Hygiene -Provides group verbal and written instruction about how sleep can affect your health.  Define sleep hygiene, discuss sleep cycles and impact of sleep habits. Review good sleep hygiene tips.    Initial Review & Psychosocial Screening:  Initial Psych Review & Screening - 10/16/22 1024       Initial Review   Current issues with Current Psychotropic Meds;History of Depression   stress depression while caring for his ailing wife. doing better now     [place]? Yes   new wife, their children, 6 sons,  grandson   Concerns Recent loss of significant other    Comments first wife died after illness,  3 years later remarried.  HAa been married 3 years      Barriers   Psychosocial barriers to participate in program There are no identifiable barriers or psychosocial needs.       Screening Interventions   Interventions Encouraged to exercise    Expected Outcomes Short Term goal: Utilizing psychosocial counselor, staff and physician to assist with identification of specific Stressors or current issues interfering with healing process. Setting desired goal for each stressor or current issue identified.;Long Term Goal: Stressors or current issues are controlled or eliminated.;Short Term goal: Identification and review with participant of any Quality of Life or Depression concerns found by scoring the questionnaire.;Long Term goal: The participant improves quality of Life and PHQ9 Scores as seen by post scores and/or verbalization of changes             Quality of Life Scores:   Quality of Life - 10/24/22 1122       Quality of Life   Select Quality of Life      Quality of Life Scores   Health/Function Pre 2037 %    Socioeconomic Pre 27.57 %    Psych/Spiritual Pre 29.14 %    Family Pre 27 %    GLOBAL Pre 24.63 %            Scores of 19 and below usually indicate a poorer quality of life in these areas.  A difference of  2-3 points is a clinically meaningful difference.  A difference of 2-3 points in the total score of the Quality of Life Index has been associated with significant improvement in overall quality of life, self-image, physical symptoms, and general health in studies assessing change in quality of life.  PHQ-9: Review Flowsheet  More data exists      01/06/2023 10/24/2022 01/03/2022 07/18/2021 12/28/2020  Depression screen PHQ 2/9  Decreased Interest 0 0 0 2 0  Down, Depressed, Hopeless 0 1 0 1 0  PHQ - 2 Score 0 1 0 3 0  Altered sleeping 0 0 - 3 -  Tired, decreased energy 0 1 - 3 -  Change in appetite 0 1 - 3 -  Feeling bad or failure about yourself  0 0 -  0 -  Trouble concentrating 0 1 - 0 -  Moving slowly or fidgety/restless 0 0 - 0 -  Suicidal thoughts 0 0 - 0 -  PHQ-9 Score 0 4 - 12 -  Difficult doing work/chores Not difficult at  all Somewhat difficult - Not difficult at all -   Interpretation of Total Score  Total Score Depression Severity:  1-4 = Minimal depression, 5-9 = Mild depression, 10-14 = Moderate depression, 15-19 = Moderately severe depression, 20-27 = Severe depression   Psychosocial Evaluation and Intervention:  Psychosocial Evaluation - 10/16/22 1034       Psychosocial Evaluation & Interventions   Interventions Encouraged to exercise with the program and follow exercise prescription    Comments Korin has no barriers to attending the program. He lives with his new wife (29yr) and they have a dog. He remarried 3 years after his first wife passed away. 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 THis added stress to his life and he started PAxil. He continues to use Paxil. He stated he is feeling less anxious at this time. He has support from his wife and children. One grandson comes to help with chores at times. He is ready to get started and work on improving his shortness of breath symptoms.    Expected Outcomes STG FDemarquesattends all scheduled sessions. He is able to see decrease of his shortness of breath symptoms. He continues to have less depression/anxiety symptoms LTG FSumedhcontinues withhis exercise progeression and decrease of his shortness of breath symptoms. He has less depression/anxiety symptoms after discharge.    Continue Psychosocial Services  Follow up required by staff             Psychosocial Re-Evaluation:   Psychosocial Discharge (Final Psychosocial Re-Evaluation):   Vocational Rehabilitation: Provide vocational rehab assistance to qualifying candidates.   Vocational Rehab Evaluation & Intervention:   Education: Education Goals: Education classes will be provided on a variety of topics geared toward better understanding of heart health and risk factor modification. Participant will state understanding/return demonstration of topics presented as  noted by education test scores.  Learning Barriers/Preferences:  Learning Barriers/Preferences - 10/16/22 1028       Learning Barriers/Preferences   Learning Barriers None    Learning Preferences None             General Cardiac Education Topics:  AED/CPR: - Group verbal and written instruction with the use of models to demonstrate the basic use of the AED with the basic ABC's of resuscitation.   Anatomy and Cardiac Procedures: - Group verbal and visual presentation and models provide information about basic cardiac anatomy and function. Reviews the testing methods done to diagnose heart disease and the outcomes of the test results. Describes the treatment choices: Medical Management, Angioplasty, or Coronary Bypass Surgery for treating various heart conditions including Myocardial Infarction, Angina, Valve Disease, and Cardiac Arrhythmias.  Written material given at graduation. Flowsheet [facility]  Education need identified 10/24/22       Medication Safety: - Group verbal and visual instruction to review commonly prescribed medications for heart and lung disease. Reviews the medication, class of the drug, and side effects. Includes the steps to properly store meds and maintain the prescription regimen.  Written material given at graduation.   Intimacy: - Group verbal instruction through game format to discuss how heart and lung disease can affect sexual intimacy.  Written material given at graduation..   Know Your Numbers and Heart Failure: - Group verbal and visual instruction to discuss disease risk factors for cardiac and pulmonary disease and treatment options.  Reviews associated critical values for Overweight/Obesity, Hypertension, Cholesterol, and Diabetes.  Discusses basics of heart failure: signs/symptoms and treatments.  Introduces Heart Failure Zone chart for action plan for heart failure.  Written material  given at graduation.   Infection Prevention: - Provides verbal and written material to individual with discussion of infection control including proper hand washing and proper equipment cleaning during exercise session. Flowsheet Row Cardiac Rehab from 10/24/2022 in [facility]  Education need identified 10/24/22  Date 10/24/22  Educator [place]  Instruction Review Code 1- Verbalizes Understanding       Falls Prevention: - Provides verbal and written material to individual with discussion of falls prevention and safety. Flowsheet Row Cardiac Rehab from 10/16/2022 in [facility] Cardiac and Pul[facility] Rehab  Date 10/16/22  Educator SB  Instruction Review Code 1- Verbalizes Understanding       Other: -Provides group and verbal instruction on various topics (see comments)   Knowledge Questionnaire Score:  Knowledge Questionnaire Score - 10/24/22 1114       Knowledge Questionnaire Score   Pre Score 22/26             Core Components/Risk Factors/Patient Goals at Admission:  Personal Goals and Risk Factors at Admission - 10/24/22 1142       Core Components/Risk Factors/Patient Goals on Admission    Weight Management Yes;Weight Loss;Obesity    Intervention Weight Management: Develop a combined nutrition and exercise program designed to reach desired caloric intake, while maintaining appropriate intake of nutrient and fiber, sodium and fats, and appropriate energy expenditure required for the weight goal.;Weight Management/Obesity: Establish reasonable short term and long term weight goals.;Weight Management: Provide education and appropriate resources to help participant work on and attain dietary goals.    Admit Weight 236 lb (107 kg)    Goal Weight: Short Term 230 lb (104.3 kg)    Goal Weight: Long Term 200 lb (90.7 kg)    Expected Outcomes Short Term: Continue to assess and modify interventions until short term weight is achieved;Long Term: Adherence to nutrition  and physical activity/exercise program aimed toward attainment of established weight goal;Weight Loss: Understanding of general recommendations for a balanced deficit meal plan, which promotes 1-2 lb weight loss per week and includes a negative energy balance of [phone] kcal/d;Understanding recommendations for meals to include 15-35% energy as protein, 25-35% energy from fat, 35-60% energy from carbohydrates, less than '200mg'$  of dietary cholesterol, 20-35 gm of total fiber daily;Understanding of distribution of calorie intake throughout the day with the consumption of 4-5 meals/snacks    Diabetes Yes    Intervention Provide education about signs/symptoms and action to take for hypo/hyperglycemia.;Provide education about proper nutrition, including hydration, and aerobic/resistive exercise prescription along with prescribed medications to achieve blood glucose in normal ranges: Fasting glucose 65-99 mg/dL    Expected Outcomes Short Term: Participant verbalizes understanding of the signs/symptoms and immediate care of hyper/hypoglycemia, proper foot care and importance of medication, aerobic/resistive exercise and nutrition plan for blood glucose control.;Long Term: Attainment of HbA1C < 7%.    Heart Failure Yes    Intervention Provide a combined exercise and nutrition program that is supplemented with education, support and counseling about heart failure. Directed toward relieving symptoms such as shortness of breath, decreased exercise tolerance, and extremity edema.    Expected  Outcomes Improve functional capacity of life;Short term: Attendance in program 2-3 days a week with increased exercise capacity. Reported lower sodium intake. Reported increased fruit and vegetable intake. Reports medication compliance.;Short term: Daily weights obtained and reported for increase. Utilizing diuretic protocols set by physician.;Long term: Adoption of self-care skills and reduction of barriers for early signs and  symptoms recognition and intervention leading to self-care maintenance.    Hypertension Yes    Intervention Provide education on lifestyle modifcations including regular physical activity/exercise, weight management, moderate sodium restriction and increased consumption of fresh fruit, vegetables, and low fat dairy, alcohol moderation, and smoking cessation.;Monitor prescription use compliance.    Expected Outcomes Short Term: Continued assessment and intervention until BP is < 140/75m HG in hypertensive participants. < 130/853mHG in hypertensive participants with diabetes, heart failure or chronic kidney disease.;Long Term: Maintenance of blood pressure at goal levels.    Lipids Yes    Intervention Provide education and support for participant on nutrition & aerobic/resistive exercise along with prescribed medications to achieve LDL '70mg'$ , HDL >'40mg'$ .    Expected Outcomes Short Term: Participant states understanding of desired cholesterol values and is compliant with medications prescribed. Participant is following exercise prescription and nutrition guidelines.;Long Term: Cholesterol controlled with medications as prescribed, with individualized exercise RX and with personalized nutrition plan. Value goals: LDL < '70mg'$ , HDL > 40 mg.             Education:Diabetes - Individual verbal and written instruction to review signs/symptoms of diabetes, desired ranges of glucose level fasting, after meals and with exercise. Acknowledge that pre and post exercise glucose checks will be done for 3 sessions at entry of program. [facility]  Education need identified 10/24/22  Date 10/24/22  Educator KLMarburyInstruction Review Code 1- Verbalizes Understanding       Core Components/Risk Factors/Patient Goals Review:    Core Components/Risk Factors/Patient Goals at Discharge (Final Review):    ITP Comments:  ITP Comments     Row Name  10/16/22 1004 10/24/22 1113 10/30/22 1711 11/06/22 0750 11/13/22 1644   ITP Comments Virtual orientation call completed today. he has an appointment on Date: 10/24/2022  for EP eval and gym Orientation.  [facility] . Completed 6MWT and gym orientation. Initial ITP created and sent for review to Dr. MaEmily FilbertMedical Director. First full day of exercise!  Patient was oriented to gym and equipment including functions, settings, policies, and procedures.  Patient's individual exercise prescription and treatment plan were reviewed.  All starting workloads were established based on the results of the 6 minute walk test done at initial orientation visit.  The plan for exercise progression was also introduced and progression will be customized based on patient's performance and goals. 30 Day review completed. Medical Director ITP review done, changes made as directed, and signed approval by Medical Director.   New to program Called patient, hasn't attended cardiac rehab in the last week and a half. Patient states he got sick and is now in the hospital with fluid overload? Unable to see hospital notes at this time. Patient will call usKoreafter he gets discharged and will determine a return date/ possible clearance pending hospital visit diagnosis.    [place] 11/20/22 1304 11/26/22 1547 12/03/22 1103 12/04/22 1000 12/23/22 1056   ITP Comments Lehman called to let usKoreanow that [facility].  He will remain out until he is feeling better. Patient called to let us know he is discharged from the hospital - had Rhinovirus. But still feeling some sickness and not up to his baseline. Patient will call us next week to let us know how he is doing. Albaro has not attended since 10/25/22.  He has been in the hospital and we were unable to assess his goal this go around. 30 Day review completed. Medical Director ITP review done, changes made as  directed, and signed approval by Medical Director. Attempted to call patient again to receive any update on how patient is feeling. Left voicemail x2. Has not attended rehab since 11/22. Will send letter at this time.    [place] Name 01/01/23 0911 01/06/23 1404         ITP Comments 30 Day review completed. Medical Director ITP review done, changes made as directed, and signed approval by Medical Director.   still no visits since 11/22 Sent patient letter and did not hear back from him.  We will discharge him at this time. Discharge ITP sent to Medical Director.               Comments: Discharge ITP

## 2023-01-06 NOTE — Patient Instructions (Signed)
Sean Hayden , Thank you for taking time to come for your Medicare Wellness Visit. I appreciate your ongoing commitment to your health goals. Please review the following plan we discussed and let me know if I can assist you in the future.   These are the goals we discussed:  Goals      Chronic Care Management     CARE PLAN ENTRY (see longitudinal plan of care for additional care plan information)  Current Barriers:  Chronic Disease Management support, education, and care coordination needs related to Hypertension, Hyperlipidemia, Diabetes, Atrial Fibrillation and Allergic Rhinitis    Hypertension BP Readings from Last 3 Encounters:  05/16/20 121/69  04/06/20 (!) 156/79  05/12/19 128/70  Pharmacist Clinical Goal(s): Over the next 90 days, patient will work with PharmD and providers to maintain BP goal <130/80 Current regimen:  Lisinopril 10 mg 1/2 tablet daily  Interventions: Discussed low salt diet and exercising as tolerated extensively Will initiate blood pressure monitoring plan  Patient self care activities - Over the next 90 days, patient will: Check Blood Pressure Weekly, document, and provide at future appointments Ensure daily salt intake < 2300 mg/day  Hyperlipidemia Lab Results  Component Value Date/Time   LDLCALC 49 02/07/2020 12:00 AM  Pharmacist Clinical Goal(s): Over the next 90 days, patient will work with PharmD and providers to achieve LDL goal < 70 Current regimen:  Atorvastatin 40 mg daily  Interventions: Discussed low cholesterol diet and exercising as tolerated extensively Will initiate cholesterol monitoring plan   Diabetes Lab Results  Component Value Date/Time   HGBA1C 7.6 (H) 05/16/2020 10:33 AM   HGBA1C 7.7 02/07/2020 12:00 AM   HGBA1C 7.1 (A) 06/08/2018 08:48 AM   HGBA1C 9.7 02/02/2018 08:46 AM   HGBA1C 7.2 11/06/2017 12:00 AM  Pharmacist Clinical Goal(s): Over the next 90 days, patient will work with PharmD and providers to maintain A1c goal  <8% Current regimen:  Jardiance 25 mg daily Levemir 60 units twice daily  Metformin 850 mg twice daily Interventions: Discussed carbohydrate counting and exercising as tolerated extensively Will initiate blood sugar monitoring plan  Patient self care activities - Over the next 90 days, patient will: Check blood sugar once daily, document, and provide at future appointments Contact provider with any episodes of hypoglycemia  Medication management Pharmacist Clinical Goal(s): Over the next 90 days, patient will work with PharmD and providers to maintain optimal medication adherence [facility]  Interventions Comprehensive medication review performed. Continue current medication management strategy Patient self care activities - Over the next 90 days, patient will: Take medications as prescribed Report any questions or concerns to PharmD and/or provider(s)     DIET - EAT MORE FRUITS AND VEGETABLES     Recommend to eat 2 servings of fruits and vegetables each a day.      DIET - EAT MORE FRUITS AND VEGETABLES     DIET - INCREASE WATER INTAKE     DIET - REDUCE SUGAR INTAKE     Recommend to cut out sugar and any desserts in diet to help aid in weight loss.      Monitor and Manage My Blood Sugar-Diabetes Type 2     Timeframe:  Long-Range Goal Priority:  High Start Date: 01/16/21                             Expected End Date: 07/16/21  Follow Up Date 04/07/2021    - check blood sugar at prescribed times - check blood sugar if I feel it is too high or too low - take the blood sugar meter to all doctor visits    Why is this important?   Checking your blood sugar at home helps to keep it from getting very high or very low.  Writing the results in a diary or log helps the doctor know how to care for you.  Your blood sugar log should have the time, date and the results.  Also, write down the amount of insulin or other medicine that you  take.  Other information, like what you ate, exercise done and how you were feeling, will also be helpful.     Notes:      Prevent falls     Recommend to remove any items from the home that may cause slips or trips.        This is a list of the screening recommended for you and due dates:  Health Maintenance  Topic Date Due   Screening for Lung Cancer  Never done   Complete foot exam   09/08/2017   Eye exam for diabetics  02/14/2022   Hemoglobin A1C  11/19/2022   Yearly kidney function blood test for diabetes  01/25/2023   Yearly kidney health urinalysis for diabetes  01/25/2023   COVID-19 Vaccine (5 - 2023-24 season) 02/20/2023   Medicare Annual Wellness Visit  01/07/2024   DTaP/Tdap/Td vaccine (6 - Td or Tdap) 09/16/2027   Pneumonia Vaccine  Completed   Flu Shot  Completed   Hepatitis C Screening: USPSTF Recommendation to screen - Ages 56-79 yo.  Completed   Zoster (Shingles) Vaccine  Completed   HPV Vaccine  Aged Out   Cologuard (Stool DNA test)  Discontinued    Advanced directives: no  Conditions/risks identified: none  Next appointment: Follow up in one year for your annual wellness visit 01/12/24 @ 8:45 am in person   Preventive Care 65 Years and Older, Male Preventive care refers to lifestyle choices and visits with your health care provider that can promote health and wellness. What does preventive care include? A yearly physical exam. This is also called an annual well check. Dental exams once or twice a year. Routine eye exams. Ask your health care provider how often you should have your eyes checked. Personal lifestyle choices, including: Daily care of your teeth and gums. Regular physical activity. Eating a healthy diet. Avoiding tobacco and drug use. Limiting alcohol use. Practicing safe sex. Taking low-dose aspirin every day. Taking vitamin and mineral supplements as recommended by your health care provider. What happens during an annual well  check? The services and screenings done by your health care provider during your annual well check will depend on your age, overall health, lifestyle risk factors, and family history of disease. Counseling  Your health care provider may ask you questions about your: Alcohol use. Tobacco use. Drug use. Emotional well-being. Home and relationship well-being. Sexual activity. Eating habits. History of falls. Memory and ability to understand (cognition). Work and work Statistician. Reproductive health. Screening  You may have the following tests or measurements: Height, weight, and BMI. Blood pressure. Lipid and cholesterol levels. These may be checked every 5 years, or more frequently if you are over 33 years old. Skin check. Lung cancer screening. You may have this screening every year starting at age 41 if you have a 30-pack-year history of smoking and currently  smoke or have quit within the past 15 years. Fecal occult blood test (FOBT) of the stool. You may have this test every year starting at age 2. Flexible sigmoidoscopy or colonoscopy. You may have a sigmoidoscopy every 5 years or a colonoscopy every 10 years starting at age 77. Hepatitis C blood test. Hepatitis B blood test. Sexually transmitted disease (STD) testing. Diabetes screening. This is done by checking your blood sugar (glucose) after you have not eaten for a while (fasting). You may have this done every 1-3 years. Bone density scan. This is done to screen for osteoporosis. You may have this done starting at age 74. Mammogram. This may be done every 1-2 years. Talk to your health care provider about how often you should have regular mammograms. Talk with your health care provider about your test results, treatment options, and if necessary, the need for more tests. Vaccines  Your health care provider may recommend certain vaccines, such as: Influenza vaccine. This is recommended every year. Tetanus, diphtheria, and  acellular pertussis (Tdap, Td) vaccine. You may need a Td booster every 10 years. Zoster vaccine. You may need this after age 75. Pneumococcal 13-valent conjugate (PCV13) vaccine. One dose is recommended after age 69. Pneumococcal polysaccharide (PPSV23) vaccine. One dose is recommended after age 50. Talk to your health care provider about which screenings and vaccines you need and how often you need them. This information is not intended to replace advice given to you by your health care provider. Make sure you discuss any questions you have with your health care provider. Document Released: 12/22/2015 Document Revised: 08/14/2016 Document Reviewed: 09/26/2015 Elsevier Interactive Patient Education  2017 [place] Prevention in the Home Falls can cause injuries. They can happen to people of all ages. There are many things you can do to make your home safe and to help prevent falls. What can I do on the outside of my home? Regularly fix the edges of walkways and driveways and fix any cracks. Remove anything that might make you trip as you walk through a door, such as a raised step or threshold. Trim any bushes or trees on the path to your home. Use bright outdoor lighting. Clear any walking paths of anything that might make someone trip, such as rocks or tools. Regularly check to see if handrails are loose or broken. Make sure that both sides of any steps have handrails. Any raised decks and porches should have guardrails on the edges. Have any leaves, snow, or ice cleared regularly. Use sand or salt on walking paths during winter. Clean up any spills in your garage right away. This includes oil or grease spills. What can I do in the bathroom? Use night lights. Install grab bars by the toilet and in the tub and shower. Do not use towel bars as grab bars. Use non-skid mats or decals in the tub or shower. If you need to sit down in the shower, use a plastic, non-slip stool. Keep  the floor dry. Clean up any water that spills on the floor as soon as it happens. Remove soap buildup in the tub or shower regularly. Attach bath mats securely with double-sided non-slip rug tape. Do not have throw rugs and other things on the floor that can make you trip. What can I do in the bedroom? Use night lights. Make sure that you have a light by your bed that is easy to reach. Do not use any sheets or blankets that are too big  for your bed. They should not hang down onto the floor. Have a firm chair that has side arms. You can use this for support while you get dressed. Do not have throw rugs and other things on the floor that can make you trip. What can I do in the kitchen? Clean up any spills right away. Avoid walking on wet floors. Keep items that you use a lot in easy-to-reach places. If you need to reach something above you, use a strong step stool that has a grab bar. Keep electrical cords out of the way. Do not use floor polish or wax that makes floors slippery. If you must use wax, use non-skid floor wax. Do not have throw rugs and other things on the floor that can make you trip. What can I do with my stairs? Do not leave any items on the stairs. Make sure that there are handrails on both sides of the stairs and use them. Fix handrails that are broken or loose. Make sure that handrails are as long as the stairways. Check any carpeting to make sure that it is firmly attached to the stairs. Fix any carpet that is loose or worn. Avoid having throw rugs at the top or bottom of the stairs. If you do have throw rugs, attach them to the floor with carpet tape. Make sure that you have a light switch at the top of the stairs and the bottom of the stairs. If you do not have them, ask someone to add them for you. What else can I do to help prevent falls? Wear shoes that: Do not have high heels. Have rubber bottoms. Are comfortable and fit you well. Are closed at the toe. Do not  wear sandals. If you use a stepladder: Make sure that it is fully opened. Do not climb a closed stepladder. Make sure that both sides of the stepladder are locked into place. Ask someone to hold it for you, if possible. Clearly mark and make sure that you can see: Any grab bars or handrails. First and last steps. Where the edge of each step is. Use tools that help you move around (mobility aids) if they are needed. These include: Canes. Walkers. Scooters. Crutches. Turn on the lights when you go into a dark area. Replace any light bulbs as soon as they burn out. Set up your furniture so you have a clear path. Avoid moving your furniture around. If any of your floors are uneven, fix them. If there are any pets around you, be aware of where they are. Review your medicines with your doctor. Some medicines can make you feel dizzy. This can increase your chance of falling. Ask your doctor what other things that you can do to help prevent falls. This information is not intended to replace advice given to you by your health care provider. Make sure you discuss any questions you have with your health care provider. Document Released: 09/21/2009 Document Revised: 05/02/2016 Document Reviewed: 12/30/2014 Elsevier Interactive Patient Education  2017 [employer].

## 2023-01-08 ENCOUNTER — Ambulatory Visit: Payer: Medicare HMO

## 2023-01-13 ENCOUNTER — Encounter: Payer: No Typology Code available for payment source | Attending: Family Medicine

## 2023-01-13 ENCOUNTER — Ambulatory Visit: Payer: Medicare HMO

## 2023-01-13 VITALS — Ht 66.5 in | Wt 224.5 lb

## 2023-01-13 DIAGNOSIS — Z952 Presence of prosthetic heart valve: Secondary | ICD-10-CM | POA: Diagnosis present

## 2023-01-13 DIAGNOSIS — Z4889 Encounter for other specified surgical aftercare: Secondary | ICD-10-CM | POA: Diagnosis not present

## 2023-01-13 LAB — GLUCOSE, CAPILLARY: Glucose-Capillary: 364 mg/dL — ABNORMAL HIGH (ref 70–99)

## 2023-01-13 NOTE — Progress Notes (Signed)
Cardiac Individual Treatment Plan  Patient Details  Name: Sean Hayden MRN: [id number] Date of Birth: November 20, 1946 Referring Provider:   Flowsheet Row Cardiac Rehab from 01/13/2023 in [facility] Cardiac and Pulmonary Rehab  Referring Provider Lelon Huh MD       Initial Encounter Date:  Flowsheet Row Cardiac Rehab from 01/13/2023 in [facility] Cardiac and Pulmonary Rehab  Date 10/24/22       Visit Diagnosis: S/P TAVR (transcatheter aortic valve replacement)  Patient's Home Medications on Admission:  Current Outpatient Medications:    acyclovir (ZOVIRAX) 400 MG tablet, 1/2 tablet up to 5 times a day for cold sores, Disp: 30 tablet, Rfl: 5   albuterol (VENTOLIN HFA) 108 (90 Base) MCG/ACT inhaler, Inhale 2 puffs into the lungs every 6 (six) hours as needed for wheezing or shortness of breath., Disp: , Rfl:    atorvastatin (LIPITOR) 20 MG tablet, Take 1 tablet (20 mg total) by mouth every evening. (Patient taking differently: Take 40 mg by mouth every evening.), Disp: 90 tablet, Rfl: 3   benzocaine (ORAJEL) 10 % mucosal gel, Use as directed 1 application in the mouth or throat as needed for mouth pain., Disp: , Rfl:    cephALEXin (KEFLEX) 500 MG capsule, TAKE ONE CAPSULE BY MOUTH FOUR TIMES A DAY FOR BACTERIAL INFECTION (Patient not taking: Reported on 01/06/2023), Disp: , Rfl:    Cholecalciferol (VITAMIN D PO), Take 2,000 Units by mouth daily. , Disp: , Rfl:    Docosanol 10 % CREA, Apply topically up to 5 times per day as needed until heals, Disp: 2 g, Rfl: 3   empagliflozin (JARDIANCE) 25 MG TABS tablet, Take 25 mg by mouth daily., Disp: 90 tablet, Rfl: 4   Flaxseed, Linseed, (FLAXSEED OIL) 1000 MG CAPS, Take 1,000 mg by mouth 2 (two) times daily., Disp: , Rfl:    fluticasone (FLONASE) 50 MCG/ACT nasal spray, Place 2 sprays into both nostrils daily. (Patient taking differently: Place 2 sprays into both nostrils 2 (two) times daily.), Disp: 16 g, Rfl: 1   furosemide (LASIX) 20 MG tablet, TAKE ONE  (1) TABLET BY MOUTH ONCE DAILY AS NEEDED FOR EDEMA (TAKE 1 TABLET DAILYAS NEEDED FOR WEIGHT GAIN > 2 POUNDS IN 24 HOURS), Disp: 30 tablet, Rfl: 1   gabapentin (NEURONTIN) 100 MG capsule, Take 1 capsule (100 mg total) by mouth 2 (two) times daily., Disp: 180 capsule, Rfl: 1   insulin detemir (LEVEMIR) 100 UNIT/ML injection, Inject 0.3 mLs (30 Units total) into the skin 2 (two) times daily. (Patient taking differently: Inject 10 Units into the skin 2 (two) times daily.), Disp: 100 mL, Rfl: 1   levETIRAcetam (KEPPRA) 750 MG tablet, Take 2 tablets (1,500 mg total) by mouth 2 (two) times daily. 3 in the am and 3 at bedtime (Patient not taking: Reported on 01/06/2023), Disp: 360 tablet, Rfl: 4   lisinopril (ZESTRIL) 2.5 MG tablet, Take 5 mg by mouth daily., Disp: , Rfl:    loratadine (CLARITIN) 10 MG tablet, Take 10 mg by mouth daily.  (Patient not taking: Reported on 01/06/2023), Disp: , Rfl:    metFORMIN (GLUCOPHAGE) 850 MG tablet, Take 1 tablet (850 mg total) by mouth 2 (two) times daily with a meal., Disp: 180 tablet, Rfl: 1   metoprolol tartrate (LOPRESSOR) 25 MG tablet, Take 0.5 tablets by mouth every 12 (twelve) hours., Disp: , Rfl:    Misc Natural Products (OSTEO BI-FLEX TRIPLE STRENGTH PO), Take 1 tablet by mouth 2 (two) times daily. , Disp: , Rfl:  MISC NATURAL PRODUCTS PO, Take 1 capsule by mouth in the morning and at bedtime. Super Beta Prostate, Disp: , Rfl:    montelukast (SINGULAIR) 10 MG tablet, TAKE ONE TABLET EVERY DAY FOR CHRONIC COUGH (Patient not taking: Reported on 01/06/2023), Disp: 30 tablet, Rfl: 12   Omega-3 Fatty Acids (FISH OIL) 1000 MG CPDR, Take 1,200 mg by mouth 2 (two) times daily. , Disp: , Rfl:    PARoxetine (PAXIL) 40 MG tablet, TAKE 1 TABLET BY MOUTH DAILY., Disp: 30 tablet, Rfl: 0   rivaroxaban (XARELTO) 20 MG TABS tablet, TAKE ONE TABLET BY MOUTH DAILY WITH SUPPER, Disp: 30 tablet, Rfl: 12   tamsulosin (FLOMAX) 0.4 MG CAPS capsule, Take 1 capsule by mouth daily. (Patient  not taking: Reported on 01/06/2023), Disp: , Rfl:    zolpidem (AMBIEN) 10 MG tablet, TAKE 1/2-1 TABLET BY MOUTH AT BEDTIME. (Patient not taking: Reported on 10/16/2022), Disp: 90 tablet, Rfl: 3  Past Medical History: Past Medical History:  Diagnosis Date   Aortic stenosis    moderate by 02/2016 echo   Aortic valve stenosis, congenital    Moderate by echo 2017   B12 deficiency    Cancer (HCC)    skin cancer - face    CVA (cerebral vascular accident) ([place])    02/2016 no residual   Diabetes mellitus without complication ([place])    Gout    Hemorrhoids    History of kidney stones    Hyperlipidemia    Hyperplastic colon polyp    Hypertension    MRSA infection    Neuropathy    PAF (paroxysmal atrial fibrillation) ([place])    Personal history of urinary calculi 05/25/2015   Pneumonia    1990's   Seizures ([place])     Tobacco Use: Social History   Tobacco Use  Smoking Status Former   Types: Cigars   Quit date: 04/08/2012   Years since quitting: 10.7  Smokeless Tobacco Never  Tobacco Comments   cigarettes and cigars  all quit 12 years ago    Labs: Review Flowsheet  More data exists      Latest Ref Rng & Units 02/07/2020 05/16/2020 07/18/2021 01/25/2022 05/20/2022  Labs for ITP Cardiac and Pulmonary Rehab  Cholestrol 100 - 199 mg/dL 123     - 122  111  -  LDL (calc) 0 - 99 mg/dL 49     - 51  45  -  HDL-C >39 mg/dL 37     - 34  33  -  Trlycerides 0 - 149 mg/dL 184     - 231  200  -  Hemoglobin A1c 4.0 - 5.6 % 7.7     7.6  8.9  8.5  7.6     Details       This result is from an external source.          Exercise Target Goals: Exercise Program Goal: Individual exercise prescription set using results from initial 6 min walk test and THRR while considering  patient's activity barriers and safety.   Exercise Prescription Goal: Initial exercise prescription builds to 30-45 minutes a day of aerobic activity, 2-3 days per week.  Home exercise guidelines will be given to patient during  program as part of exercise prescription that the participant will acknowledge.   Education: Aerobic Exercise: - Group verbal and visual presentation on the components of exercise prescription. Introduces F.I.T.T principle from ACSM for exercise prescriptions.  Reviews F.I.T.T. principles of aerobic exercise including progression. Written material  given at graduation. Flowsheet Row Cardiac Rehab from 10/24/2022 in [facility] Cardiac and Pulmonary Rehab  Education need identified 10/24/22       Education: Resistance Exercise: - Group verbal and visual presentation on the components of exercise prescription. Introduces F.I.T.T principle from ACSM for exercise prescriptions  Reviews F.I.T.T. principles of resistance exercise including progression. Written material given at graduation.    Education: Exercise & Equipment Safety: - Individual verbal instruction and demonstration of equipment use and safety with use of the equipment. Flowsheet Row Cardiac Rehab from 10/24/2022 in [facility] Cardiac and Pulmonary Rehab  Education need identified 10/24/22  Date 10/24/22  Educator [place]  Instruction Review Code 1- Verbalizes Understanding       Education: Exercise Physiology & General Exercise Guidelines: - Group verbal and written instruction with models to review the exercise physiology of the cardiovascular system and associated critical values. Provides general exercise guidelines with specific guidelines to those with heart or lung disease.  Flowsheet Row Cardiac Rehab from 10/24/2022 in [facility] Cardiac and Pulmonary Rehab  Education need identified 10/24/22       Education: Flexibility, Balance, Mind/Body Relaxation: - Group verbal and visual presentation with interactive activity on the components of exercise prescription. Introduces F.I.T.T principle from ACSM for exercise prescriptions. Reviews F.I.T.T. principles of flexibility and balance exercise training including progression. Also discusses the  mind body connection.  Reviews various relaxation techniques to help reduce and manage stress (i.e. Deep breathing, progressive muscle relaxation, and visualization). Balance handout provided to take home. Written material given at graduation.   Activity Barriers & Risk Stratification:  Activity Barriers & Cardiac Risk Stratification - 10/24/22 1123       Activity Barriers & Cardiac Risk Stratification   Activity Barriers Deconditioning;Muscular Weakness;Shortness of Breath;Arthritis;Other (comment);Decreased Ventricular Function    Comments Seizures, resulting in hand numbness    Cardiac Risk Stratification High             6 Minute Walk:  6 Minute Walk     Row Name 10/24/22 1128 01/13/23 1644       6 Minute Walk   Phase Initial Initial    Distance 550 feet 657 feet    Walk Time 6 minutes 6 minutes    # of Rest Breaks 0 0    MPH 1.04 1.24    METS 0.84 1.43    RPE 16 13    Perceived Dyspnea  1 0    VO2 Peak 2.96 5    Symptoms Yes (comment) Yes (comment)    Comments Fatigue Legs fatigued    Resting HR 90 bpm 102 bpm    Resting BP 118/72 104/58    Resting Oxygen Saturation  94 % 96 %    Exercise Oxygen Saturation  during 6 min walk 90 % 95 %    Max Ex. HR 120 bpm 156 bpm    Max Ex. BP 126/68 128/62    2 Minute Post BP 120/74 108/64             Oxygen Initial Assessment:   Oxygen Re-Evaluation:   Oxygen Discharge (Final Oxygen Re-Evaluation):   Initial Exercise Prescription:  Initial Exercise Prescription - 01/13/23 1600       Date of Initial Exercise RX and Referring Provider   Date 10/24/22    Referring Provider Lelon Huh MD      Oxygen   Maintain Oxygen Saturation 88% or higher      Recumbant Bike   Level 1    RPM  60    Watts 10    Minutes 15    METs 1      NuStep   Level 1    SPM 80    Minutes 15    METs 1      T5 Nustep   Level 1    SPM 80    Minutes 15    METs 1      Track   Laps 8    Minutes 15    METs 1.44       Prescription Details   Frequency (times per week) 2    Duration Progress to 30 minutes of continuous aerobic without signs/symptoms of physical distress      Intensity   THRR 40-80% of Max Heartrate 118-134    Ratings of Perceived Exertion 11-13    Perceived Dyspnea 0-4      Progression   Progression Continue to progress workloads to maintain intensity without signs/symptoms of physical distress.      Resistance Training   Training Prescription Yes    Weight 2 lb    Reps 10-15             Perform Capillary Blood Glucose checks as needed.  Exercise Prescription Changes:   Exercise Prescription Changes     Row Name 10/24/22 1100 11/04/22 1500 01/13/23 1600         Response to Exercise   Blood Pressure (Admit) 118/72 108/70 104/58     Blood Pressure (Exercise) 126/68 120/72 128/62     Blood Pressure (Exit) 120/74 106/66 108/64     Heart Rate (Admit) 90 bpm 92 bpm 102 bpm     Heart Rate (Exercise) 120 bpm 112 bpm 156 bpm     Heart Rate (Exit) 87 bpm 99 bpm 98 bpm     Oxygen Saturation (Admit) 94 % -- 96 %     Oxygen Saturation (Exercise) 90 % -- 95 %     Oxygen Saturation (Exit) 94 % -- --     Rating of Perceived Exertion (Exercise) '16 15 13     '$ Perceived Dyspnea (Exercise) 1 -- 0     Symptoms Fatigue none Legs fatigued     Comments walk test results 2nd full day of exercise 6MWT Results     Duration -- Progress to 30 minutes of  aerobic without signs/symptoms of physical distress --     Intensity -- THRR unchanged --       Progression   Progression -- Continue to progress workloads to maintain intensity without signs/symptoms of physical distress. --     Average METs -- 2.01 --       Resistance Training   Training Prescription -- Yes --     Weight -- 2 lb --     Reps -- 10-15 --       Interval Training   Interval Training -- No --       NuStep   Level -- 1 --     Minutes -- 15 --     METs -- 2 --       Track   Laps -- 14 --     Minutes -- 15 --      METs -- 1.76 --       Oxygen   Maintain Oxygen Saturation -- 88% or higher --              Exercise Comments:   Exercise Comments     Row Name 10/30/22 1711  Exercise Comments First full day of exercise!  Patient was oriented to gym and equipment including functions, settings, policies, and procedures.  Patient's individual exercise prescription and treatment plan were reviewed.  All starting workloads were established based on the results of the 6 minute walk test done at initial orientation visit.  The plan for exercise progression was also introduced and progression will be customized based on patient's performance and goals.                Exercise Goals and Review:   Exercise Goals     Row Name 10/24/22 1141             Exercise Goals   Increase Physical Activity Yes       Intervention Provide advice, education, support and counseling about physical activity/exercise needs.;Develop an individualized exercise prescription for aerobic and resistive training based on initial evaluation findings, risk stratification, comorbidities and participant's personal goals.       Expected Outcomes Short Term: Attend rehab on a regular basis to increase amount of physical activity.;Long Term: Add in home exercise to make exercise part of routine and to increase amount of physical activity.;Long Term: Exercising regularly at least 3-5 days a week.       Increase Strength and Stamina Yes       Intervention Provide advice, education, support and counseling about physical activity/exercise needs.;Develop an individualized exercise prescription for aerobic and resistive training based on initial evaluation findings, risk stratification, comorbidities and participant's personal goals.       Expected Outcomes Short Term: Increase workloads from initial exercise prescription for resistance, speed, and METs.;Short Term: Perform resistance training exercises routinely during rehab  and add in resistance training at home;Long Term: Improve cardiorespiratory fitness, muscular endurance and strength as measured by increased METs and functional capacity (6MWT)       Able to understand and use rate of perceived exertion (RPE) scale Yes       Intervention Provide education and explanation on how to use RPE scale       Expected Outcomes Short Term: Able to use RPE daily in rehab to express subjective intensity level;Long Term:  Able to use RPE to guide intensity level when exercising independently       Able to understand and use Dyspnea scale Yes       Intervention Provide education and explanation on how to use Dyspnea scale       Expected Outcomes Short Term: Able to use Dyspnea scale daily in rehab to express subjective sense of shortness of breath during exertion;Long Term: Able to use Dyspnea scale to guide intensity level when exercising independently       Knowledge and understanding of Target Heart Rate Range (THRR) Yes       Intervention Provide education and explanation of THRR including how the numbers were predicted and where they are located for reference       Expected Outcomes Short Term: Able to state/look up THRR;Long Term: Able to use THRR to govern intensity when exercising independently;Short Term: Able to use daily as guideline for intensity in rehab       Able to check pulse independently Yes       Intervention Provide education and demonstration on how to check pulse in carotid and radial arteries.;Review the importance of being able to check your own pulse for safety during independent exercise       Expected Outcomes Short Term: Able to explain why pulse checking is important  during independent exercise;Long Term: Able to check pulse independently and accurately       Understanding of Exercise Prescription Yes       Intervention Provide education, explanation, and written materials on patient's individual exercise prescription       Expected Outcomes Short  Term: Able to explain program exercise prescription;Long Term: Able to explain home exercise prescription to exercise independently                Exercise Goals Re-Evaluation :  Exercise Goals Re-Evaluation     Row Name 10/30/22 1712 11/04/22 1535 11/21/22 1423 12/05/22 1201 12/17/22 1358     Exercise Goal Re-Evaluation   Exercise Goals Review Increase Physical Activity;Able to understand and use rate of perceived exertion (RPE) scale;Knowledge and understanding of Target Heart Rate Range (THRR);Understanding of Exercise Prescription;Increase Strength and Stamina;Able to check pulse independently Increase Physical Activity;Increase Strength and Stamina;Understanding of Exercise Prescription Increase Physical Activity;Increase Strength and Stamina;Understanding of Exercise Prescription Increase Physical Activity;Increase Strength and Stamina;Understanding of Exercise Prescription Increase Physical Activity;Increase Strength and Stamina;Understanding of Exercise Prescription   Comments Reviewed RPE scale, THR and program prescription with pt today.  Pt voiced understanding and was given a copy of goals to take home. Damarius is off to a good start with rehab for the first couple of sessions he has been here. He was able to walk a full 14 laps on the track! We will continue to monitor as he progresses further in the program. Jahlon has not attended rehab since the last review. We will reach out to him and remind him of the importance of attending rehab consistently for the greatest benefit. We will continue to monitor when he returns. Yeshua has been recently discharged from the hospital, having rhinovirus. He did let us know that he was still wasn't feeling the best and will let us know when he feels ready to come back. Will continue to update with patient. Lucas continues to be out of rehab, after being discharged from the hospital from having rhinovirus. He did let us know that he still wasn't feeling  the best and will let us know when he feels ready to come back. We will continue to update with patient.   Expected Outcomes Short: Use RPE daily to regulate intensity.  Long: Follow program prescription in THR. Short: continue to follow initial exercise prescription Long: Continue to build up overall strength and stamina Short: Return to regular attendance. Long: Continue to increase strength and stamina. Short: Return to rehab when feeling better Long: Continue to increase overall MET level Short: Return to rehab when feeling better Long: Continue to improve strength and stamina.    [place] Name 01/01/23 1120             Exercise Goal Re-Evaluation   Exercise Goals Review Increase Physical Activity;Increase Strength and Stamina;Understanding of Exercise Prescription       Comments Divon has not returned to rehab. We have attempted to contact patient but have not received a response, and therfore, we have sent him a discharge letter. If we do not hear back, we will discharge patient.       Expected Outcomes Short: Return to rehab Long: Graduate from [facility] program                Discharge Exercise Prescription (Final Exercise Prescription Changes):  Exercise Prescription Changes - 01/13/23 1600       Response to Exercise   Blood Pressure (Admit) 104/58    Blood  Pressure (Exercise) 128/62    Blood Pressure (Exit) 108/64    Heart Rate (Admit) 102 bpm    Heart Rate (Exercise) 156 bpm    Heart Rate (Exit) 98 bpm    Oxygen Saturation (Admit) 96 %    Oxygen Saturation (Exercise) 95 %    Rating of Perceived Exertion (Exercise) 13    Perceived Dyspnea (Exercise) 0    Symptoms Legs fatigued    Comments 6MWT Results             Nutrition:  Target Goals: Understanding of nutrition guidelines, daily intake of sodium '1500mg'$ , cholesterol '200mg'$ , calories 30% from fat and 7% or less from saturated fats, daily to have 5 or more servings of fruits and vegetables.  Education: All  About Nutrition: -Group instruction provided by verbal, written material, interactive activities, discussions, models, and posters to present general guidelines for heart healthy nutrition including fat, fiber, MyPlate, the role of sodium in heart healthy nutrition, utilization of the nutrition label, and utilization of this knowledge for meal planning. Follow up email sent as well. Written material given at graduation. Flowsheet Row Cardiac Rehab from 10/24/2022 in [facility] Cardiac and Pulmonary Rehab  Education need identified 10/24/22       Biometrics:  Pre Biometrics - 01/13/23 1646       Pre Biometrics   Height 5' 6.5" (1.689 m)    Weight 224 lb 8 oz (101.8 kg)    BMI (Calculated) 35.7    Single Leg Stand 0 seconds              Nutrition Therapy Plan and Nutrition Goals:  Nutrition Therapy & Goals - 10/24/22 1117       Intervention Plan   Intervention Prescribe, educate and counsel regarding individualized specific dietary modifications aiming towards targeted core components such as weight, hypertension, lipid management, diabetes, heart failure and other comorbidities.    Expected Outcomes Short Term Goal: Understand basic principles of dietary content, such as calories, fat, sodium, cholesterol and nutrients.;Short Term Goal: A plan has been developed with personal nutrition goals set during dietitian appointment.;Long Term Goal: Adherence to prescribed nutrition plan.             Nutrition Assessments:  MEDIFICTS Score Key: ?70 Need to make dietary changes  40-70 Heart Healthy Diet ? 40 Therapeutic Level Cholesterol Diet  Flowsheet Row Cardiac Rehab from 10/24/2022 in [facility] Cardiac and Pulmonary Rehab  Picture Your Plate Total Score on Admission 51      Picture Your Plate Scores: <70 Unhealthy dietary pattern with much room for improvement. 41-50 Dietary pattern unlikely to meet recommendations for good health and room for improvement. 51-60 More healthful  dietary pattern, with some room for improvement.  >60 Healthy dietary pattern, although there may be some specific behaviors that could be improved.    Nutrition Goals Re-Evaluation:   Nutrition Goals Discharge (Final Nutrition Goals Re-Evaluation):   Psychosocial: Target Goals: Acknowledge presence or absence of significant depression and/or stress, maximize coping skills, provide positive support system. Participant is able to verbalize types and ability to use techniques and skills needed for reducing stress and depression.   Education: Stress, Anxiety, and Depression - Group verbal and visual presentation to define topics covered.  Reviews how body is impacted by stress, anxiety, and depression.  Also discusses healthy ways to reduce stress and to treat/manage anxiety and depression.  Written material given at graduation.   Education: Sleep Hygiene -Provides group verbal and written instruction about how sleep  can affect your health.  Define sleep hygiene, discuss sleep cycles and impact of sleep habits. Review good sleep hygiene tips.    Initial Review & Psychosocial Screening:  Initial Psych Review & Screening - 10/16/22 1024       Initial Review   Current issues with Current Psychotropic Meds;History of Depression   stress depression while caring for his ailing wife. doing better now     [place]? Yes   new wife, their children, 6 sons,  grandson   Concerns Recent loss of significant other    Comments first wife died after illness,  3 years later remarried.  HAa been married 3 years      Barriers   Psychosocial barriers to participate in program There are no identifiable barriers or psychosocial needs.      Screening Interventions   Interventions Encouraged to exercise    Expected Outcomes Short Term goal: Utilizing psychosocial counselor, staff and physician to assist with identification of specific Stressors or current issues interfering  with healing process. Setting desired goal for each stressor or current issue identified.;Long Term Goal: Stressors or current issues are controlled or eliminated.;Short Term goal: Identification and review with participant of any Quality of Life or Depression concerns found by scoring the questionnaire.;Long Term goal: The participant improves quality of Life and PHQ9 Scores as seen by post scores and/or verbalization of changes             Quality of Life Scores:   Quality of Life - 10/24/22 1122       Quality of Life   Select Quality of Life      Quality of Life Scores   Health/Function Pre 2037 %    Socioeconomic Pre 27.57 %    Psych/Spiritual Pre 29.14 %    Family Pre 27 %    GLOBAL Pre 24.63 %            Scores of 19 and below usually indicate a poorer quality of life in these areas.  A difference of  2-3 points is a clinically meaningful difference.  A difference of 2-3 points in the total score of the Quality of Life Index has been associated with significant improvement in overall quality of life, self-image, physical symptoms, and general health in studies assessing change in quality of life.  PHQ-9: Review Flowsheet  More data exists      01/06/2023 10/24/2022 01/03/2022 07/18/2021 12/28/2020  Depression screen PHQ 2/9  Decreased Interest 0 0 0 2 0  Down, Depressed, Hopeless 0 1 0 1 0  PHQ - 2 Score 0 1 0 3 0  Altered sleeping 0 0 - 3 -  Tired, decreased energy 0 1 - 3 -  Change in appetite 0 1 - 3 -  Feeling bad or failure about yourself  0 0 - 0 -  Trouble concentrating 0 1 - 0 -  Moving slowly or fidgety/restless 0 0 - 0 -  Suicidal thoughts 0 0 - 0 -  PHQ-9 Score 0 4 - 12 -  Difficult doing work/chores Not difficult at all Somewhat difficult - Not difficult at all -   Interpretation of Total Score  Total Score Depression Severity:  1-4 = Minimal depression, 5-9 = Mild depression, 10-14 = Moderate depression, 15-19 = Moderately severe depression, 20-27 =  Severe depression   Psychosocial Evaluation and Intervention:  Psychosocial Evaluation - 10/16/22 1034       Psychosocial Evaluation & Interventions  Interventions Encouraged to exercise with the program and follow exercise prescription    Comments Fredie has no barriers to attending the program. He lives with his new wife (83yr) and they have a dog. He remarried 3 years after his first wife passed away. 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 THis added stress to his life and he started PAxil. He continues to use Paxil. He stated he is feeling less anxious at this time. He has support from his wife and children. One grandson comes to help with chores at times. He is ready to get started and work on improving his shortness of breath symptoms.    Expected Outcomes STG FQuentonattends all scheduled sessions. He is able to see decrease of his shortness of breath symptoms. He continues to have less depression/anxiety symptoms LTG FDaniellcontinues withhis exercise progeression and decrease of his shortness of breath symptoms. He has less depression/anxiety symptoms after discharge.    Continue Psychosocial Services  Follow up required by staff             Psychosocial Re-Evaluation:   Psychosocial Discharge (Final Psychosocial Re-Evaluation):   Vocational Rehabilitation: Provide vocational rehab assistance to qualifying candidates.   Vocational Rehab Evaluation & Intervention:   Education: Education Goals: Education classes will be provided on a variety of topics geared toward better understanding of heart health and risk factor modification. Participant will state understanding/return demonstration of topics presented as noted by education test scores.  Learning Barriers/Preferences:  Learning Barriers/Preferences - 10/16/22 1028       Learning Barriers/Preferences   Learning Barriers None    Learning Preferences None             General  Cardiac Education Topics:  AED/CPR: - Group verbal and written instruction with the use of models to demonstrate the basic use of the AED with the basic ABC's of resuscitation.   Anatomy and Cardiac Procedures: - Group verbal and visual presentation and models provide information about basic cardiac anatomy and function. Reviews the testing methods done to diagnose heart disease and the outcomes of the test results. Describes the treatment choices: Medical Management, Angioplasty, or Coronary Bypass Surgery for treating various heart conditions including Myocardial Infarction, Angina, Valve Disease, and Cardiac Arrhythmias.  Written material given at graduation. [facility]  Education need identified 10/24/22       Medication Safety: - Group verbal and visual instruction to review commonly prescribed medications for heart and lung disease. Reviews the medication, class of the drug, and side effects. Includes the steps to properly store meds and maintain the prescription regimen.  Written material given at graduation.   Intimacy: - Group verbal instruction through game format to discuss how heart and lung disease can affect sexual intimacy. Written material given at graduation..   Know Your Numbers and Heart Failure: - Group verbal and visual instruction to discuss disease risk factors for cardiac and pulmonary disease and treatment options.  Reviews associated critical values for Overweight/Obesity, Hypertension, Cholesterol, and Diabetes.  Discusses basics of heart failure: signs/symptoms and treatments.  Introduces Heart Failure Zone chart for action plan for heart failure.  Written material given at graduation.   Infection Prevention: - Provides verbal and written material to individual with discussion of infection control including proper hand washing and proper equipment cleaning during exercise session. Flowsheet  [facility]  Education need identified 10/24/22  Date 10/24/22  Educator [place]  Instruction Review Code 1- Verbalizes Understanding       Falls Prevention: - Provides verbal and written material to individual with discussion of falls prevention and safety. Flowsheet Row Cardiac Rehab from 10/16/2022 in [facility]  Date 10/16/22  Educator SB  Instruction Review Code 1- Verbalizes Understanding       Other: -Provides group and verbal instruction on various topics (see comments)   Knowledge Questionnaire Score:  Knowledge Questionnaire Score - 10/24/22 1114       Knowledge Questionnaire Score   Pre Score 22/26             Core Components/Risk Factors/Patient Goals at Admission:  Personal Goals and Risk Factors at Admission - 01/13/23 1653       Core Components/Risk Factors/Patient Goals on Admission    Weight Management Yes;Weight Loss;Obesity    Intervention Weight Management: Develop a combined nutrition and exercise program designed to reach desired caloric intake, while maintaining appropriate intake of nutrient and fiber, sodium and fats, and appropriate energy expenditure required for the weight goal.;Weight Management/Obesity: Establish reasonable short term and long term weight goals.;Weight Management: Provide education and appropriate resources to help participant work on and attain dietary goals.    Admit Weight 224 lb 8 oz (101.8 kg)    Goal Weight: Long Term 200 lb (90.7 kg)    Expected Outcomes Short Term: Continue to assess and modify interventions until short term weight is achieved;Long Term: Adherence to nutrition and physical activity/exercise program aimed toward attainment of established weight goal;Weight Loss: Understanding of general recommendations for a balanced deficit meal plan, which promotes 1-2 lb weight loss per week and includes a negative energy balance of [phone]  kcal/d;Understanding recommendations for meals to include 15-35% energy as protein, 25-35% energy from fat, 35-60% energy from carbohydrates, less than '200mg'$  of dietary cholesterol, 20-35 gm of total fiber daily;Understanding of distribution of calorie intake throughout the day with the consumption of 4-5 meals/snacks    Diabetes Yes    Intervention Provide education about signs/symptoms and action to take for hypo/hyperglycemia.;Provide education about proper nutrition, including hydration, and aerobic/resistive exercise prescription along with prescribed medications to achieve blood glucose in normal ranges: Fasting glucose 65-99 mg/dL    Expected Outcomes Short Term: Participant verbalizes understanding of the signs/symptoms and immediate care of hyper/hypoglycemia, proper foot care and importance of medication, aerobic/resistive exercise and nutrition plan for blood glucose control.;Long Term: Attainment of HbA1C < 7%.    Heart Failure Yes    Intervention Provide a combined exercise and nutrition program that is supplemented with education, support and counseling about heart failure. Directed toward relieving symptoms such as shortness of breath, decreased exercise tolerance, and extremity edema.    Expected Outcomes Improve functional capacity of life;Short term: Attendance in program 2-3 days a week with increased exercise capacity. Reported lower sodium intake. Reported increased fruit and vegetable intake. Reports medication compliance.;Short term: Daily weights obtained and reported for increase. Utilizing diuretic protocols set by physician.;Long term: Adoption of self-care skills and reduction of barriers for early signs and symptoms recognition and intervention leading to self-care maintenance.    Hypertension Yes    Intervention Provide education on lifestyle modifcations including regular physical activity/exercise, weight management, moderate sodium restriction and increased consumption of  fresh fruit, vegetables, and low fat dairy, alcohol moderation, and smoking cessation.;Monitor prescription use compliance.    Expected Outcomes Short Term: Continued assessment and  intervention until BP is < 140/36m HG in hypertensive participants. < 130/853mHG in hypertensive participants with diabetes, heart failure or chronic kidney disease.;Long Term: Maintenance of blood pressure at goal levels.    Lipids Yes    Intervention Provide education and support for participant on nutrition & aerobic/resistive exercise along with prescribed medications to achieve LDL '70mg'$ , HDL >'40mg'$ .    Expected Outcomes Short Term: Participant states understanding of desired cholesterol values and is compliant with medications prescribed. Participant is following exercise prescription and nutrition guidelines.;Long Term: Cholesterol controlled with medications as prescribed, with individualized exercise RX and with personalized nutrition plan. Value goals: LDL < '70mg'$ , HDL > 40 mg.             Education:Diabetes - Individual verbal and written instruction to review signs/symptoms of diabetes, desired ranges of glucose level fasting, after meals and with exercise. Acknowledge that pre and post exercise glucose checks will be done for 3 sessions at entry of program. FlSnyderrom [facility]  Education need identified 10/24/22  Date 10/24/22  Educator KLStoddardInstruction Review Code 1- Verbalizes Understanding       Core Components/Risk Factors/Patient Goals Review:    Core Components/Risk Factors/Patient Goals at Discharge (Final Review):    ITP Comments:  ITP Comments     Row Name 10/16/22 1004 10/24/22 1113 10/30/22 1711 11/20/22 1304 11/26/22 1547   ITP Comments Virtual orientation call completed today. he has an appointment on Date: 10/24/2022  for EP eval and gym Orientation.  [facility] .  Completed 6MWT and gym orientation. Initial ITP created and sent for review to Dr. MaEmily FilbertMedical Director. First full day of exercise!  Patient was oriented to gym and equipment including functions, settings, policies, and procedures.  Patient's individual exercise prescription and treatment plan were reviewed.  All starting workloads were established based on the results of the 6 minute walk test done at initial orientation visit.  The plan for exercise progression was also introduced and progression will be customized based on patient's performance and goals. Hakeen called to let usKoreanow that [facility].  He will remain out until he is feeling better. Patient called to let usKoreanow he is discharged from the hospital - had Rhinovirus. But still feeling some sickness and not up to his baseline. Patient will call usKoreaext week to let usKoreanow how he is doing.    RoStannardsame 12/03/22 1103 12/04/22 1000 12/23/22 1056 01/01/23 0911 01/06/23 1404   ITP Comments Eulice has not attended since 10/25/22.  He has been in the hospital and we were unable to assess his goal this go around. 30 Day review completed. Medical Director ITP review done, changes made as directed, and signed approval by Medical Director. Attempted to call patient again to receive any update on how patient is feeling. Left voicemail x2. Has not attended rehab since 11/22. Will send letter at this time. 30 Day review completed. Medical Director ITP review done, changes made as directed, and signed approval by Medical Director.   still no visits since 11/22 Sent patient letter and did not hear back from him.  We will discharge him at this time. Discharge ITP sent to Medical Director.    RoAlbertvilleame 01/13/23 1638           ITP Comments Patient returned to cardiac rehab  after insurance change.  6 minute walk test performed and updated ITP created.                Comments: updated ITP

## 2023-01-13 NOTE — Progress Notes (Signed)
Daily Session Note  Patient Details  Name: Sean Hayden MRN: [id number] Date of Birth: 1946-09-30 Referring Provider:   Flowsheet Row Cardiac Rehab from 10/24/2022 in [facility] Cardiac and Pul[facility] Rehab  Referring Provider Lelon Huh MD       Encounter Date: 01/13/2023  Check In:  Session Check In - 01/13/23 1636       Check-In   Supervising physician immediately available to respond to emergencies See telemetry face sheet for immediately available ER MD    Location [facility]-Cardiac & Pulmonary Rehab    Staff Present Renita Papa, RN BSN;Noah Tickle, BS, Exercise Physiologist;Laureen Owens Shark, BS, RRT, CPFT    Virtual Visit No    Medication changes reported     No    Fall or balance concerns reported    No    Warm-up and Cool-down Not performed (comment)   6 min walk test   Resistance Training Performed Yes    VAD Patient? No    PAD/SET Patient? No      Pain Assessment   Currently in Pain? No/denies                Social History   Tobacco Use  Smoking Status Former   Types: Cigars   Quit date: 04/08/2012   Years since quitting: 10.7  Smokeless Tobacco Never  Tobacco Comments   cigarettes and cigars  all quit 12 years ago    Goals Met:  Independence with exercise equipment Exercise tolerated well No report of concerns or symptoms today Strength training completed today  Goals Unmet:  Not Applicable  Comments:  Patient returned to cardiac rehab after insurance change. 6 minute walk test performed and updated ITP created.   Dr. Emily Filbert is Medical Director for [facility].  Dr. Ottie Glazier is Medical Director for [facility]monary [facility].

## 2023-01-13 NOTE — Patient Instructions (Signed)
Patient Instructions  Patient Details  Name: Sean Hayden MRN: [id number] Date of Birth: October 07, 1946 Referring Provider:  Birdie Sons, MD  Below are your personal goals for exercise, nutrition, and risk factors. Our goal is to help you stay on track towards obtaining and maintaining these goals. We will be discussing your progress on these goals with you throughout the program.  Initial Exercise Prescription:  Initial Exercise Prescription - 01/13/23 1600       Date of Initial Exercise RX and Referring Provider   Date 10/24/22    Referring Provider Lelon Huh MD      Oxygen   Maintain Oxygen Saturation 88% or higher      Recumbant Bike   Level 1    RPM 60    Watts 10    Minutes 15    METs 1      NuStep   Level 1    SPM 80    Minutes 15    METs 1      T5 Nustep   Level 1    SPM 80    Minutes 15    METs 1      Track   Laps 8    Minutes 15    METs 1.44      Prescription Details   Frequency (times per week) 2    Duration Progress to 30 minutes of continuous aerobic without signs/symptoms of physical distress      Intensity   THRR 40-80% of Max Heartrate 118-134    Ratings of Perceived Exertion 11-13    Perceived Dyspnea 0-4      Progression   Progression Continue to progress workloads to maintain intensity without signs/symptoms of physical distress.      Resistance Training   Training Prescription Yes    Weight 2 lb    Reps 10-15             Exercise Goals: Frequency: Be able to perform aerobic exercise two to three times per week in program working toward 2-5 days per week of home exercise.  Intensity: Work with a perceived exertion of 11 (fairly light) - 15 (hard) while following your exercise prescription.  We will make changes to your prescription with you as you progress through the program.   Duration: Be able to do 30 to 45 minutes of continuous aerobic exercise in addition to a 5 minute warm-up and a 5 minute cool-down routine.    Nutrition Goals: Your personal nutrition goals will be established when you do your nutrition analysis with the dietician.  The following are general nutrition guidelines to follow: Cholesterol < '200mg'$ /day Sodium < '1500mg'$ /day Fiber: Men over 50 yrs - 30 grams per day  Personal Goals:  Personal Goals and Risk Factors at Admission - 10/24/22 1142       Core Components/Risk Factors/Patient Goals on Admission    Weight Management Yes;Weight Loss;Obesity    Intervention Weight Management: Develop a combined nutrition and exercise program designed to reach desired caloric intake, while maintaining appropriate intake of nutrient and fiber, sodium and fats, and appropriate energy expenditure required for the weight goal.;Weight Management/Obesity: Establish reasonable short term and long term weight goals.;Weight Management: Provide education and appropriate resources to help participant work on and attain dietary goals.    Admit Weight 236 lb (107 kg)    Goal Weight: Short Term 230 lb (104.3 kg)    Goal Weight: Long Term 200 lb (90.7 kg)    Expected Outcomes Short  Term: Continue to assess and modify interventions until short term weight is achieved;Long Term: Adherence to nutrition and physical activity/exercise program aimed toward attainment of established weight goal;Weight Loss: Understanding of general recommendations for a balanced deficit meal plan, which promotes 1-2 lb weight loss per week and includes a negative energy balance of [phone] kcal/d;Understanding recommendations for meals to include 15-35% energy as protein, 25-35% energy from fat, 35-60% energy from carbohydrates, less than '200mg'$  of dietary cholesterol, 20-35 gm of total fiber daily;Understanding of distribution of calorie intake throughout the day with the consumption of 4-5 meals/snacks    Diabetes Yes    Intervention Provide education about signs/symptoms and action to take for hypo/hyperglycemia.;Provide education about  proper nutrition, including hydration, and aerobic/resistive exercise prescription along with prescribed medications to achieve blood glucose in normal ranges: Fasting glucose 65-99 mg/dL    Expected Outcomes Short Term: Participant verbalizes understanding of the signs/symptoms and immediate care of hyper/hypoglycemia, proper foot care and importance of medication, aerobic/resistive exercise and nutrition plan for blood glucose control.;Long Term: Attainment of HbA1C < 7%.    Heart Failure Yes    Intervention Provide a combined exercise and nutrition program that is supplemented with education, support and counseling about heart failure. Directed toward relieving symptoms such as shortness of breath, decreased exercise tolerance, and extremity edema.    Expected Outcomes Improve functional capacity of life;Short term: Attendance in program 2-3 days a week with increased exercise capacity. Reported lower sodium intake. Reported increased fruit and vegetable intake. Reports medication compliance.;Short term: Daily weights obtained and reported for increase. Utilizing diuretic protocols set by physician.;Long term: Adoption of self-care skills and reduction of barriers for early signs and symptoms recognition and intervention leading to self-care maintenance.    Hypertension Yes    Intervention Provide education on lifestyle modifcations including regular physical activity/exercise, weight management, moderate sodium restriction and increased consumption of fresh fruit, vegetables, and low fat dairy, alcohol moderation, and smoking cessation.;Monitor prescription use compliance.    Expected Outcomes Short Term: Continued assessment and intervention until BP is < 140/14m HG in hypertensive participants. < 130/841mHG in hypertensive participants with diabetes, heart failure or chronic kidney disease.;Long Term: Maintenance of blood pressure at goal levels.    Lipids Yes    Intervention Provide education and  support for participant on nutrition & aerobic/resistive exercise along with prescribed medications to achieve LDL '70mg'$ , HDL >'40mg'$ .    Expected Outcomes Short Term: Participant states understanding of desired cholesterol values and is compliant with medications prescribed. Participant is following exercise prescription and nutrition guidelines.;Long Term: Cholesterol controlled with medications as prescribed, with individualized exercise RX and with personalized nutrition plan. Value goals: LDL < '70mg'$ , HDL > 40 mg.             Tobacco Use Initial Evaluation: Social History   Tobacco Use  Smoking Status Former   Types: Cigars   Quit date: 04/08/2012   Years since quitting: 10.7  Smokeless Tobacco Never  Tobacco Comments   cigarettes and cigars  all quit 12 years ago    Exercise Goals and Review:  Exercise Goals     Row Name 10/24/22 1141             Exercise Goals   Increase Physical Activity Yes       Intervention Provide advice, education, support and counseling about physical activity/exercise needs.;Develop an individualized exercise prescription for aerobic and resistive training based on initial evaluation findings, risk stratification, comorbidities and participant's personal goals.  Expected Outcomes Short Term: Attend rehab on a regular basis to increase amount of physical activity.;Long Term: Add in home exercise to make exercise part of routine and to increase amount of physical activity.;Long Term: Exercising regularly at least 3-5 days a week.       Increase Strength and Stamina Yes       Intervention Provide advice, education, support and counseling about physical activity/exercise needs.;Develop an individualized exercise prescription for aerobic and resistive training based on initial evaluation findings, risk stratification, comorbidities and participant's personal goals.       Expected Outcomes Short Term: Increase workloads from initial exercise prescription  for resistance, speed, and METs.;Short Term: Perform resistance training exercises routinely during rehab and add in resistance training at home;Long Term: Improve cardiorespiratory fitness, muscular endurance and strength as measured by increased METs and functional capacity (6MWT)       Able to understand and use rate of perceived exertion (RPE) scale Yes       Intervention Provide education and explanation on how to use RPE scale       Expected Outcomes Short Term: Able to use RPE daily in rehab to express subjective intensity level;Long Term:  Able to use RPE to guide intensity level when exercising independently       Able to understand and use Dyspnea scale Yes       Intervention Provide education and explanation on how to use Dyspnea scale       Expected Outcomes Short Term: Able to use Dyspnea scale daily in rehab to express subjective sense of shortness of breath during exertion;Long Term: Able to use Dyspnea scale to guide intensity level when exercising independently       Knowledge and understanding of Target Heart Rate Range (THRR) Yes       Intervention Provide education and explanation of THRR including how the numbers were predicted and where they are located for reference       Expected Outcomes Short Term: Able to state/look up THRR;Long Term: Able to use THRR to govern intensity when exercising independently;Short Term: Able to use daily as guideline for intensity in rehab       Able to check pulse independently Yes       Intervention Provide education and demonstration on how to check pulse in carotid and radial arteries.;Review the importance of being able to check your own pulse for safety during independent exercise       Expected Outcomes Short Term: Able to explain why pulse checking is important during independent exercise;Long Term: Able to check pulse independently and accurately       Understanding of Exercise Prescription Yes       Intervention Provide education,  explanation, and written materials on patient's individual exercise prescription       Expected Outcomes Short Term: Able to explain program exercise prescription;Long Term: Able to explain home exercise prescription to exercise independently

## 2023-01-15 ENCOUNTER — Ambulatory Visit: Payer: Medicare HMO

## 2023-01-15 ENCOUNTER — Encounter: Payer: No Typology Code available for payment source | Admitting: *Deleted

## 2023-01-15 DIAGNOSIS — Z952 Presence of prosthetic heart valve: Secondary | ICD-10-CM

## 2023-01-15 LAB — GLUCOSE, CAPILLARY: Glucose-Capillary: 378 mg/dL — ABNORMAL HIGH (ref 70–99)

## 2023-01-15 NOTE — Progress Notes (Signed)
Incomplete Session Note  Patient Details  Name: Sean Hayden MRN: [id number] Date of Birth: 02/24/1946 Referring Provider:   Flowsheet Row Cardiac Rehab from 01/13/2023 in [facility] Cardiac and Pulmonary Rehab  Referring Provider Lelon Huh MD       Sean Hayden did not complete his rehab session.  His CBG of 378 is outside of the exercise range. Education discussed on medication, diet, and to call his doctor if this keeps happening.

## 2023-01-20 ENCOUNTER — Ambulatory Visit: Payer: Medicare HMO

## 2023-01-20 ENCOUNTER — Encounter: Payer: No Typology Code available for payment source | Admitting: *Deleted

## 2023-01-20 DIAGNOSIS — Z952 Presence of prosthetic heart valve: Secondary | ICD-10-CM

## 2023-01-20 LAB — GLUCOSE, CAPILLARY
Glucose-Capillary: 206 mg/dL — ABNORMAL HIGH (ref 70–99)
Glucose-Capillary: 147 mg/dL — ABNORMAL HIGH (ref 70–99)

## 2023-01-20 NOTE — Progress Notes (Signed)
Daily Session Note  Patient Details  Name: Sean Hayden MRN: [id number] Date of Birth: 1946/10/17 Referring Provider:   Flowsheet Row Cardiac Rehab from 01/13/2023 in [facility] Cardiac and Pulmonary Rehab  Referring Provider Lelon Huh MD       Encounter Date: 01/20/2023  Check In:  Session Check In - 01/20/23 1603       Check-In   Supervising physician immediately available to respond to emergencies See telemetry face sheet for immediately available ER MD    Location [facility]-Cardiac & Pulmonary Rehab    Staff Present Renita Papa, RN BSN;Joseph [place], RCP,RRT,BSRT;Jessica Goldthwaite, [place], RCEP, CCRP, CCET    Virtual Visit No    Medication changes reported     No    Fall or balance concerns reported    No    Warm-up and Cool-down Performed on first and last piece of equipment    Resistance Training Performed Yes    VAD Patient? No    PAD/SET Patient? No      Pain Assessment   Currently in Pain? No/denies                Social History   Tobacco Use  Smoking Status Former   Types: Cigars   Quit date: 04/08/2012   Years since quitting: 10.7  Smokeless Tobacco Never  Tobacco Comments   cigarettes and cigars  all quit 12 years ago    Goals Met:  Independence with exercise equipment Exercise tolerated well No report of concerns or symptoms today Strength training completed today  Goals Unmet:  Not Applicable  Comments: Pt able to follow exercise prescription today without complaint.  Will continue to monitor for progression.    Dr. Emily Filbert is Medical Director for [facility] [facility].  Dr. Ottie Glazier is Medical Director for [facility] Pulmonary Rehabilitation.

## 2023-01-22 ENCOUNTER — Encounter: Payer: No Typology Code available for payment source | Admitting: *Deleted

## 2023-01-22 ENCOUNTER — Ambulatory Visit: Payer: Medicare HMO

## 2023-01-22 DIAGNOSIS — Z952 Presence of prosthetic heart valve: Secondary | ICD-10-CM | POA: Diagnosis not present

## 2023-01-22 LAB — GLUCOSE, CAPILLARY: Glucose-Capillary: 238 mg/dL — ABNORMAL HIGH (ref 70–99)

## 2023-01-22 NOTE — Progress Notes (Signed)
Daily Session Note  Patient Details  Name: Sean Hayden MRN: [id number] Date of Birth: 05-07-1946 Referring Provider:   Flowsheet Row Cardiac Rehab from 01/13/2023 in [facility] Cardiac and Pulmonary Rehab  Referring Provider Lelon Huh MD       Encounter Date: 01/22/2023  Check In:  Session Check In - 01/22/23 1643       Check-In   Supervising physician immediately available to respond to emergencies See telemetry face sheet for immediately available ER MD    Location [facility]-Cardiac & Pulmonary Rehab    Staff Present Renita Papa, RN BSN;Joseph Lucas, RCP,RRT,BSRT;Kelly [place], [place], ACSM CEP, Exercise Physiologist    Virtual Visit No    Medication changes reported     No    Fall or balance concerns reported    No    Warm-up and Cool-down Performed on first and last piece of equipment    Resistance Training Performed Yes    VAD Patient? No    PAD/SET Patient? No      Pain Assessment   Currently in Pain? No/denies                Social History   Tobacco Use  Smoking Status Former   Types: Cigars   Quit date: 04/08/2012   Years since quitting: 10.7  Smokeless Tobacco Never  Tobacco Comments   cigarettes and cigars  all quit 12 years ago    Goals Met:  Independence with exercise equipment Exercise tolerated well No report of concerns or symptoms today Strength training completed today  Goals Unmet:  Not Applicable  Comments: Pt able to follow exercise prescription today without complaint.  Will continue to monitor for progression.    Dr. Emily Filbert is Medical Director for [facility] [facility].  Dr. Ottie Glazier is Medical Director for [facility] Pulmonary Rehabilitation.

## 2023-01-27 ENCOUNTER — Ambulatory Visit: Payer: Medicare HMO

## 2023-01-27 ENCOUNTER — Encounter: Payer: No Typology Code available for payment source | Admitting: *Deleted

## 2023-01-27 DIAGNOSIS — Z952 Presence of prosthetic heart valve: Secondary | ICD-10-CM | POA: Diagnosis not present

## 2023-01-27 LAB — GLUCOSE, CAPILLARY
Glucose-Capillary: 159 mg/dL — ABNORMAL HIGH (ref 70–99)
Glucose-Capillary: 106 mg/dL — ABNORMAL HIGH (ref 70–99)

## 2023-01-27 NOTE — Progress Notes (Signed)
Daily Session Note  Patient Details  Name: Sean Hayden MRN: [id number] Date of Birth: 1946-10-27 Referring Provider:   Flowsheet Row Cardiac Rehab from 01/13/2023 in [facility] Cardiac and Pulmonary Rehab  Referring Provider Lelon Huh MD       Encounter Date: 01/27/2023  Check In:  Session Check In - 01/27/23 1558       Check-In   Supervising physician immediately available to respond to emergencies See telemetry face sheet for immediately available ER MD    Location [facility]-Cardiac & Pulmonary Rehab    Staff Present Renita Papa, RN BSN;Joseph Tessie Fass, RCP,RRT,BSRT;Noah [place], [place], Exercise Physiologist    Virtual Visit No    Medication changes reported     No    Fall or balance concerns reported    No    Warm-up and Cool-down Performed on first and last piece of equipment    Resistance Training Performed Yes    VAD Patient? No    PAD/SET Patient? No      Pain Assessment   Currently in Pain? No/denies                Social History   Tobacco Use  Smoking Status Former   Types: Cigars   Quit date: 04/08/2012   Years since quitting: 10.8  Smokeless Tobacco Never  Tobacco Comments   cigarettes and cigars  all quit 12 years ago    Goals Met:  Independence with exercise equipment Exercise tolerated well No report of concerns or symptoms today Strength training completed today  Goals Unmet:  Not Applicable  Comments: Pt able to follow exercise prescription today without complaint.  Will continue to monitor for progression.    Dr. Emily Filbert is Medical Director for [facility].  Dr. Ottie Glazier is Medical Director for [facility] Pulmonary Rehabilitation.

## 2023-01-29 ENCOUNTER — Emergency Department (HOSPITAL_COMMUNITY)
Admission: EM | Admit: 2023-01-29 | Discharge: 2023-01-29 | Disposition: A | Discharge status: Home / Self Care | Payer: No Typology Code available for payment source | Attending: Emergency Medicine | Admitting: Emergency Medicine

## 2023-01-29 ENCOUNTER — Encounter: Payer: No Typology Code available for payment source | Admitting: *Deleted

## 2023-01-29 ENCOUNTER — Emergency Department (HOSPITAL_COMMUNITY): Payer: No Typology Code available for payment source

## 2023-01-29 ENCOUNTER — Encounter (HOSPITAL_COMMUNITY): Payer: Self-pay

## 2023-01-29 ENCOUNTER — Ambulatory Visit: Payer: Medicare HMO

## 2023-01-29 ENCOUNTER — Encounter: Payer: Self-pay | Admitting: *Deleted

## 2023-01-29 DIAGNOSIS — Z79899 Other long term (current) drug therapy: Secondary | ICD-10-CM | POA: Insufficient documentation

## 2023-01-29 DIAGNOSIS — R609 Edema, unspecified: Secondary | ICD-10-CM | POA: Diagnosis not present

## 2023-01-29 DIAGNOSIS — Z952 Presence of prosthetic heart valve: Secondary | ICD-10-CM | POA: Diagnosis not present

## 2023-01-29 DIAGNOSIS — Z794 Long term (current) use of insulin: Secondary | ICD-10-CM | POA: Insufficient documentation

## 2023-01-29 DIAGNOSIS — Z85828 Personal history of other malignant neoplasm of skin: Secondary | ICD-10-CM | POA: Diagnosis not present

## 2023-01-29 DIAGNOSIS — M542 Cervicalgia: Secondary | ICD-10-CM | POA: Insufficient documentation

## 2023-01-29 DIAGNOSIS — R58 Hemorrhage, not elsewhere classified: Secondary | ICD-10-CM | POA: Diagnosis not present

## 2023-01-29 DIAGNOSIS — Z23 Encounter for immunization: Secondary | ICD-10-CM | POA: Diagnosis not present

## 2023-01-29 DIAGNOSIS — S0083XA Contusion of other part of head, initial encounter: Secondary | ICD-10-CM | POA: Insufficient documentation

## 2023-01-29 DIAGNOSIS — W0110XA Fall on same level from slipping, tripping and stumbling with subsequent striking against unspecified object, initial encounter: Secondary | ICD-10-CM | POA: Insufficient documentation

## 2023-01-29 DIAGNOSIS — M79642 Pain in left hand: Secondary | ICD-10-CM | POA: Diagnosis present

## 2023-01-29 DIAGNOSIS — Z7901 Long term (current) use of anticoagulants: Secondary | ICD-10-CM | POA: Diagnosis not present

## 2023-01-29 DIAGNOSIS — E119 Type 2 diabetes mellitus without complications: Secondary | ICD-10-CM | POA: Insufficient documentation

## 2023-01-29 DIAGNOSIS — I10 Essential (primary) hypertension: Secondary | ICD-10-CM | POA: Diagnosis not present

## 2023-01-29 DIAGNOSIS — S0081XA Abrasion of other part of head, initial encounter: Secondary | ICD-10-CM

## 2023-01-29 DIAGNOSIS — W19XXXA Unspecified fall, initial encounter: Secondary | ICD-10-CM | POA: Diagnosis not present

## 2023-01-29 DIAGNOSIS — S0031XA Abrasion of nose, initial encounter: Secondary | ICD-10-CM | POA: Insufficient documentation

## 2023-01-29 DIAGNOSIS — R0902 Hypoxemia: Secondary | ICD-10-CM | POA: Diagnosis not present

## 2023-01-29 DIAGNOSIS — Y92481 Parking lot as the place of occurrence of the external cause: Secondary | ICD-10-CM | POA: Insufficient documentation

## 2023-01-29 DIAGNOSIS — S61412A Laceration without foreign body of left hand, initial encounter: Secondary | ICD-10-CM | POA: Insufficient documentation

## 2023-01-29 DIAGNOSIS — S060XAA Concussion with loss of consciousness status unknown, initial encounter: Secondary | ICD-10-CM

## 2023-01-29 LAB — CBC WITH DIFFERENTIAL/PLATELET
Abs Immature Granulocytes: 0.03 10*3/uL (ref 0.00–0.07)
Basophils Absolute: 0 10*3/uL (ref 0.0–0.1)
Basophils Relative: 1 %
Eosinophils Absolute: 0.2 10*3/uL (ref 0.0–0.5)
Eosinophils Relative: 2 %
HCT: 46.9 % (ref 39.0–52.0)
Hemoglobin: 15.3 g/dL (ref 13.0–17.0)
Immature Granulocytes: 1 %
Lymphocytes Relative: 22 %
Lymphs Abs: 1.4 10*3/uL (ref 0.7–4.0)
MCH: 30.6 pg (ref 26.0–34.0)
MCHC: 32.6 g/dL (ref 30.0–36.0)
MCV: 93.8 fL (ref 80.0–100.0)
Monocytes Absolute: 0.6 10*3/uL (ref 0.1–1.0)
Monocytes Relative: 10 %
Neutro Abs: 4.2 10*3/uL (ref 1.7–7.7)
Neutrophils Relative %: 64 %
Platelets: 150 10*3/uL (ref 150–400)
RBC: 5 MIL/uL (ref 4.22–5.81)
RDW: 13.8 % (ref 11.5–15.5)
WBC: 6.5 10*3/uL (ref 4.0–10.5)
nRBC: 0 % (ref 0.0–0.2)

## 2023-01-29 LAB — COMPREHENSIVE METABOLIC PANEL
ALT: 15 U/L (ref 0–44)
AST: 29 U/L (ref 15–41)
Albumin: 3.5 g/dL (ref 3.5–5.0)
Alkaline Phosphatase: 85 U/L (ref 38–126)
Anion gap: 12 (ref 5–15)
BUN: 28 mg/dL — ABNORMAL HIGH (ref 8–23)
CO2: 24 mmol/L (ref 22–32)
Calcium: 9 mg/dL (ref 8.9–10.3)
Chloride: 97 mmol/L — ABNORMAL LOW (ref 98–111)
Creatinine, Ser: 1.09 mg/dL (ref 0.61–1.24)
GFR, Estimated: >60 mL/min (ref >60)
Glucose, Bld: 178 mg/dL — ABNORMAL HIGH (ref 70–99)
Potassium: 5.3 mmol/L — ABNORMAL HIGH (ref 3.5–5.1)
Sodium: 133 mmol/L — ABNORMAL LOW (ref 135–145)
Total Bilirubin: 1.2 mg/dL (ref 0.3–1.2)
Total Protein: 6.8 g/dL (ref 6.5–8.1)

## 2023-01-29 MED ORDER — HYDROCODONE-ACETAMINOPHEN 5-325 MG PO TABS
1.0000 | ORAL_TABLET | Freq: Once | ORAL | Status: AC
  Administered 2023-01-29: 1 via ORAL
  Filled 2023-01-29: qty 1

## 2023-01-29 MED ORDER — HYDROCODONE-ACETAMINOPHEN 5-325 MG PO TABS: 1.0000 | ORAL_TABLET | ORAL | 0 refills | Status: AC | PRN

## 2023-01-29 MED ORDER — TETANUS-DIPHTH-ACELL PERTUSSIS 5-2.5-18.5 LF-MCG/0.5 IM SUSY
0.5000 mL | PREFILLED_SYRINGE | Freq: Once | INTRAMUSCULAR | Status: AC
  Administered 2023-01-29: 0.5 mL via INTRAMUSCULAR
  Filled 2023-01-29: qty 0.5

## 2023-01-29 MED ORDER — ACETAMINOPHEN 325 MG PO TABS
650.0000 mg | ORAL_TABLET | Freq: Once | ORAL | Status: AC
  Administered 2023-01-29: 650 mg via ORAL
  Filled 2023-01-29: qty 2

## 2023-01-29 NOTE — ED Notes (Signed)
Pt to CT 3 at this time with cardiac monitoring and Trauma RN

## 2023-01-29 NOTE — ED Notes (Signed)
Wound care provided and discussed with patient.

## 2023-01-29 NOTE — Discharge Instructions (Addendum)
Return if any problems.

## 2023-01-29 NOTE — ED Provider Notes (Signed)
[facility] Provider Note   CSN: [id number] Arrival date & time: 01/29/23  1931     History  Chief Complaint  Patient presents with   Level 2 Fall on Thinners     Sean Hayden is a 77 y.o. male.  Pt reports he tripped on a curb and fell hitting his face.  Patient complains of a headache.  Patient takes Xarelto.  Patient complains of pain to his right forehead he complains of pain to his left hand patient complains of some soreness to his neck.  Patient reports he did not lose consciousness.  Patient denies any injury to his chest he denies any abdominal pain he denies any pain to his legs.  The history is provided by the patient. No language interpreter was used.       Home Medications Prior to Admission medications   Medication Sig Start Date End Date Taking? Authorizing Provider  acyclovir (ZOVIRAX) 400 MG tablet 1/2 tablet up to 5 times a day for cold sores 05/20/22   Birdie Sons, MD  albuterol (VENTOLIN HFA) 108 (90 Base) MCG/ACT inhaler Inhale 2 puffs into the lungs every 6 (six) hours as needed for wheezing or shortness of breath.    [provider]  atorvastatin (LIPITOR) 20 MG tablet Take 1 tablet (20 mg total) by mouth every evening. Patient taking differently: Take 40 mg by mouth every evening. 05/12/19   Birdie Sons, MD  benzocaine (ORAJEL) 10 % mucosal gel Use as directed 1 application in the mouth or throat as needed for mouth pain.    [provider]  cephALEXin (KEFLEX) 500 MG capsule TAKE ONE CAPSULE BY MOUTH FOUR TIMES A DAY FOR BACTERIAL INFECTION Patient not taking: Reported on 01/06/2023 10/13/22   [provider]  Cholecalciferol (VITAMIN D PO) Take 2,000 Units by mouth daily.     [provider]  Docosanol 10 % CREA Apply topically up to 5 times per day as needed until heals 12/03/16   Mar Daring, PA-C  empagliflozin (JARDIANCE) 25 MG TABS tablet Take 25 mg by  mouth daily. 05/12/19   Birdie Sons, MD  Flaxseed, Linseed, (FLAXSEED OIL) 1000 MG CAPS Take 1,000 mg by mouth 2 (two) times daily.    [provider]  fluticasone (FLONASE) 50 MCG/ACT nasal spray Place 2 sprays into both nostrils daily. Patient taking differently: Place 2 sprays into both nostrils 2 (two) times daily. 12/06/16   Birdie Sons, MD  furosemide (LASIX) 20 MG tablet TAKE ONE (1) TABLET BY MOUTH ONCE DAILY AS NEEDED FOR EDEMA (TAKE 1 TABLET DAILYAS NEEDED FOR WEIGHT GAIN > 2 POUNDS IN 24 HOURS) 12/05/22   Birdie Sons, MD  gabapentin (NEURONTIN) 100 MG capsule Take 1 capsule (100 mg total) by mouth 2 (two) times daily. 05/12/19   Birdie Sons, MD  insulin detemir (LEVEMIR) 100 UNIT/ML injection Inject 0.3 mLs (30 Units total) into the skin 2 (two) times daily. Patient taking differently: Inject 10 Units into the skin 2 (two) times daily. 10/11/16   [name] Lighter, MD  levETIRAcetam (KEPPRA) 750 MG tablet Take 2 tablets (1,500 mg total) by mouth 2 (two) times daily. 3 in the am and 3 at bedtime Patient not taking: Reported on 01/06/2023 04/13/19   Marcial Pacas, MD  lisinopril (ZESTRIL) 2.5 MG tablet Take 5 mg by mouth daily. 09/11/22   [provider]  loratadine (CLARITIN) 10 MG tablet Take 10 mg  by mouth daily.  Patient not taking: Reported on 01/06/2023 03/24/12   [provider]  metFORMIN (GLUCOPHAGE) 850 MG tablet Take 1 tablet (850 mg total) by mouth 2 (two) times daily with a meal. 05/12/19   Birdie Sons, MD  metoprolol tartrate (LOPRESSOR) 25 MG tablet Take 0.5 tablets by mouth every 12 (twelve) hours. 09/11/22 09/11/23  [provider]  Misc Natural Products (OSTEO BI-FLEX TRIPLE STRENGTH PO) Take 1 tablet by mouth 2 (two) times daily.     [provider]  MISC NATURAL PRODUCTS PO Take 1 capsule by mouth in the morning and at bedtime. Super Beta Prostate    [provider]  montelukast (SINGULAIR) 10 MG tablet TAKE  ONE TABLET EVERY DAY FOR CHRONIC COUGH Patient not taking: Reported on 01/06/2023 12/11/18   Birdie Sons, MD  Omega-3 Fatty Acids (FISH OIL) 1000 MG CPDR Take 1,200 mg by mouth 2 (two) times daily.     [provider]  PARoxetine (PAXIL) 40 MG tablet TAKE 1 TABLET BY MOUTH DAILY. 12/04/22   Birdie Sons, MD  rivaroxaban (XARELTO) 20 MG TABS tablet TAKE ONE TABLET BY MOUTH DAILY WITH SUPPER 12/04/22   Birdie Sons, MD  tamsulosin (FLOMAX) 0.4 MG CAPS capsule Take 1 capsule by mouth daily. Patient not taking: Reported on 01/06/2023 04/19/21   [provider]  zolpidem (AMBIEN) 10 MG tablet TAKE 1/2-1 TABLET BY MOUTH AT BEDTIME. Patient not taking: Reported on 10/16/2022 03/25/22   Birdie Sons, MD      Allergies    Pioglitazone    Review of Systems   Review of Systems  Skin:  Positive for wound.  All other systems reviewed and are negative.   Physical Exam Updated Vital Signs BP 121/71   Pulse 82   Temp 97.9 F (36.6 C) (Oral)   Resp 15   SpO2 93%  Physical Exam Vitals and nursing note reviewed.  Constitutional:      Appearance: He is well-developed.  HENT:     Head:     Comments: Abrasion forehead, abrasion nose, bridge and chin    Right Ear: External ear normal.     Left Ear: External ear normal.     Nose: Nose normal.     Mouth/Throat:     Mouth: Mucous membranes are moist.  Eyes:     Extraocular Movements: Extraocular movements intact.     Pupils: Pupils are equal, round, and reactive to light.  Cardiovascular:     Rate and Rhythm: Normal rate.  Pulmonary:     Effort: Pulmonary effort is normal.  Abdominal:     General: Abdomen is flat. There is no distension.  Musculoskeletal:        General: Normal range of motion.     Cervical back: Normal range of motion.     Comments: Superficial skin tear left hand  Skin:    General: Skin is warm.  Neurological:     General: No focal deficit present.     Mental Status: He is alert and  oriented to person, place, and time.  Psychiatric:        Mood and Affect: Mood normal.     ED Results / Procedures / Treatments   Labs (all labs ordered are listed, but only abnormal results are displayed) Labs Reviewed - No data to display  EKG None  Radiology No results found.  Procedures Procedures    Medications Ordered in ED Medications - No data to  display  ED Course/ Medical Decision Making/ A&P                             Medical Decision Making Pt reports he tripped over a curb and fell sticking his face.  Pt complains of pain in his forehead and face.  Pt has a cut to left hand   Amount and/or Complexity of Data Reviewed External Data Reviewed: notes.    Details: Cardiology notes reviewed  Labs: ordered. Decision-making details documented in ED Course.    Details: Labs ordered reviewed and interpreted Radiology: ordered and independent interpretation performed. Decision-making details documented in ED Course.    Details: CT head CT maxillofacial CT C-spine are ordered reviewed and interpreted patient has no intracranial abnormality no cervical spine abnormality X-ray left hand shows no evidence of fracture  Risk Prescription drug management. Risk Details: Patient counseled on results Steri-Strips to left hand antibiotic ointment to abrasions.  Patient is given hydrocodone here for pain.  Patient is given a prescription for hydrocodone.  Patient is counseled on wound care and the need to follow-up with his primary care physician for recheck in 2 to 3 days.           Final Clinical Impression(s) / ED Diagnoses Final diagnoses:  Contusion of forehead, initial encounter  Abrasion of face, initial encounter    Rx / DC Orders ED Discharge Orders          Ordered    HYDROcodone-acetaminophen (NORCO/VICODIN) 5-325 MG tablet  Every 4 hours PRN        01/29/23 2115           An After Visit Summary was printed and given to the patient.     Sidney Ace 01/29/23 2130    Regan Lemming, MD 01/29/23 2140

## 2023-01-29 NOTE — Progress Notes (Signed)
Cardiac Individual Treatment Plan  Patient Details  Name: Sean Hayden MRN: [id number] Date of Birth: November 20, 1946 Referring Provider:   Flowsheet Row Cardiac Rehab from 01/13/2023 in [facility] Cardiac and Pulmonary Rehab  Referring Provider Lelon Huh MD       Initial Encounter Date:  Flowsheet Row Cardiac Rehab from 01/13/2023 in [facility] Cardiac and Pulmonary Rehab  Date 10/24/22       Visit Diagnosis: S/P TAVR (transcatheter aortic valve replacement)  Patient's Home Medications on Admission:  Current Outpatient Medications:    acyclovir (ZOVIRAX) 400 MG tablet, 1/2 tablet up to 5 times a day for cold sores, Disp: 30 tablet, Rfl: 5   albuterol (VENTOLIN HFA) 108 (90 Base) MCG/ACT inhaler, Inhale 2 puffs into the lungs every 6 (six) hours as needed for wheezing or shortness of breath., Disp: , Rfl:    atorvastatin (LIPITOR) 20 MG tablet, Take 1 tablet (20 mg total) by mouth every evening. (Patient taking differently: Take 40 mg by mouth every evening.), Disp: 90 tablet, Rfl: 3   benzocaine (ORAJEL) 10 % mucosal gel, Use as directed 1 application in the mouth or throat as needed for mouth pain., Disp: , Rfl:    cephALEXin (KEFLEX) 500 MG capsule, TAKE ONE CAPSULE BY MOUTH FOUR TIMES A DAY FOR BACTERIAL INFECTION (Patient not taking: Reported on 01/06/2023), Disp: , Rfl:    Cholecalciferol (VITAMIN D PO), Take 2,000 Units by mouth daily. , Disp: , Rfl:    Docosanol 10 % CREA, Apply topically up to 5 times per day as needed until heals, Disp: 2 g, Rfl: 3   empagliflozin (JARDIANCE) 25 MG TABS tablet, Take 25 mg by mouth daily., Disp: 90 tablet, Rfl: 4   Flaxseed, Linseed, (FLAXSEED OIL) 1000 MG CAPS, Take 1,000 mg by mouth 2 (two) times daily., Disp: , Rfl:    fluticasone (FLONASE) 50 MCG/ACT nasal spray, Place 2 sprays into both nostrils daily. (Patient taking differently: Place 2 sprays into both nostrils 2 (two) times daily.), Disp: 16 g, Rfl: 1   furosemide (LASIX) 20 MG tablet, TAKE ONE  (1) TABLET BY MOUTH ONCE DAILY AS NEEDED FOR EDEMA (TAKE 1 TABLET DAILYAS NEEDED FOR WEIGHT GAIN > 2 POUNDS IN 24 HOURS), Disp: 30 tablet, Rfl: 1   gabapentin (NEURONTIN) 100 MG capsule, Take 1 capsule (100 mg total) by mouth 2 (two) times daily., Disp: 180 capsule, Rfl: 1   insulin detemir (LEVEMIR) 100 UNIT/ML injection, Inject 0.3 mLs (30 Units total) into the skin 2 (two) times daily. (Patient taking differently: Inject 10 Units into the skin 2 (two) times daily.), Disp: 100 mL, Rfl: 1   levETIRAcetam (KEPPRA) 750 MG tablet, Take 2 tablets (1,500 mg total) by mouth 2 (two) times daily. 3 in the am and 3 at bedtime (Patient not taking: Reported on 01/06/2023), Disp: 360 tablet, Rfl: 4   lisinopril (ZESTRIL) 2.5 MG tablet, Take 5 mg by mouth daily., Disp: , Rfl:    loratadine (CLARITIN) 10 MG tablet, Take 10 mg by mouth daily.  (Patient not taking: Reported on 01/06/2023), Disp: , Rfl:    metFORMIN (GLUCOPHAGE) 850 MG tablet, Take 1 tablet (850 mg total) by mouth 2 (two) times daily with a meal., Disp: 180 tablet, Rfl: 1   metoprolol tartrate (LOPRESSOR) 25 MG tablet, Take 0.5 tablets by mouth every 12 (twelve) hours., Disp: , Rfl:    Misc Natural Products (OSTEO BI-FLEX TRIPLE STRENGTH PO), Take 1 tablet by mouth 2 (two) times daily. , Disp: , Rfl:  MISC NATURAL PRODUCTS PO, Take 1 capsule by mouth in the morning and at bedtime. Super Beta Prostate, Disp: , Rfl:    montelukast (SINGULAIR) 10 MG tablet, TAKE ONE TABLET EVERY DAY FOR CHRONIC COUGH (Patient not taking: Reported on 01/06/2023), Disp: 30 tablet, Rfl: 12   Omega-3 Fatty Acids (FISH OIL) 1000 MG CPDR, Take 1,200 mg by mouth 2 (two) times daily. , Disp: , Rfl:    PARoxetine (PAXIL) 40 MG tablet, TAKE 1 TABLET BY MOUTH DAILY., Disp: 30 tablet, Rfl: 0   rivaroxaban (XARELTO) 20 MG TABS tablet, TAKE ONE TABLET BY MOUTH DAILY WITH SUPPER, Disp: 30 tablet, Rfl: 12   tamsulosin (FLOMAX) 0.4 MG CAPS capsule, Take 1 capsule by mouth daily. (Patient  not taking: Reported on 01/06/2023), Disp: , Rfl:    zolpidem (AMBIEN) 10 MG tablet, TAKE 1/2-1 TABLET BY MOUTH AT BEDTIME. (Patient not taking: Reported on 10/16/2022), Disp: 90 tablet, Rfl: 3  Past Medical History: Past Medical History:  Diagnosis Date   Aortic stenosis    moderate by 02/2016 echo   Aortic valve stenosis, congenital    Moderate by echo 2017   B12 deficiency    Cancer (HCC)    skin cancer - face    CVA (cerebral vascular accident) ([address])    02/2016 no residual   Diabetes mellitus without complication ([place])    Gout    Hemorrhoids    History of kidney stones    Hyperlipidemia    Hyperplastic colon polyp    Hypertension    MRSA infection    Neuropathy    PAF (paroxysmal atrial fibrillation) ([place])    Personal history of urinary calculi 05/25/2015   Pneumonia    1990's   Seizures ([place])     Tobacco Use: Social History   Tobacco Use  Smoking Status Former   Types: Cigars   Quit date: 04/08/2012   Years since quitting: 10.8  Smokeless Tobacco Never  Tobacco Comments   cigarettes and cigars  all quit 12 years ago    Labs: Review Flowsheet  More data exists      Latest Ref Rng & Units 02/07/2020 05/16/2020 07/18/2021 01/25/2022 05/20/2022  Labs for ITP Cardiac and Pulmonary Rehab  Cholestrol 100 - 199 mg/dL 123     - 122  111  -  LDL (calc) 0 - 99 mg/dL 49     - 51  45  -  HDL-C >39 mg/dL 37     - 34  33  -  Trlycerides 0 - 149 mg/dL 184     - 231  200  -  Hemoglobin A1c 4.0 - 5.6 % 7.7     7.6  8.9  8.5  7.6     Details       This result is from an external source.          Exercise Target Goals: Exercise Program Goal: Individual exercise prescription set using results from initial 6 min walk test and THRR while considering  patient's activity barriers and safety.   Exercise Prescription Goal: Initial exercise prescription builds to 30-45 minutes a day of aerobic activity, 2-3 days per week.  Home exercise guidelines will be given to patient during  program as part of exercise prescription that the participant will acknowledge.   Education: Aerobic Exercise: - Group verbal and visual presentation on the components of exercise prescription. Introduces F.I.T.T principle from ACSM for exercise prescriptions.  Reviews F.I.T.T. principles of aerobic exercise including progression. Written material  given at graduation. Flowsheet Row Cardiac Rehab from 10/24/2022 in [facility] Cardiac and Pulmonary Rehab  Education need identified 10/24/22       Education: Resistance Exercise: - Group verbal and visual presentation on the components of exercise prescription. Introduces F.I.T.T principle from ACSM for exercise prescriptions  Reviews F.I.T.T. principles of resistance exercise including progression. Written material given at graduation.    Education: Exercise & Equipment Safety: - Individual verbal instruction and demonstration of equipment use and safety with use of the equipment. Flowsheet Row Cardiac Rehab from 10/24/2022 in [facility] Cardiac and Pulmonary Rehab  Education need identified 10/24/22  Date 10/24/22  Educator [place]  Instruction Review Code 1- Verbalizes Understanding       Education: Exercise Physiology & General Exercise Guidelines: - Group verbal and written instruction with models to review the exercise physiology of the cardiovascular system and associated critical values. Provides general exercise guidelines with specific guidelines to those with heart or lung disease.  Flowsheet Row Cardiac Rehab from 10/24/2022 in [facility] Cardiac and Pulmonary Rehab  Education need identified 10/24/22       Education: Flexibility, Balance, Mind/Body Relaxation: - Group verbal and visual presentation with interactive activity on the components of exercise prescription. Introduces F.I.T.T principle from ACSM for exercise prescriptions. Reviews F.I.T.T. principles of flexibility and balance exercise training including progression. Also discusses the  mind body connection.  Reviews various relaxation techniques to help reduce and manage stress (i.e. Deep breathing, progressive muscle relaxation, and visualization). Balance handout provided to take home. Written material given at graduation.   Activity Barriers & Risk Stratification:   6 Minute Walk:  6 Minute Walk     Row Name 01/13/23 1644         6 Minute Walk   Phase Initial     Distance 657 feet     Walk Time 6 minutes     # of Rest Breaks 0     MPH 1.24     METS 1.43     RPE 13     Perceived Dyspnea  0     VO2 Peak 5     Symptoms Yes (comment)     Comments Legs fatigued     Resting HR 102 bpm     Resting BP 104/58     Resting Oxygen Saturation  96 %     Exercise Oxygen Saturation  during 6 min walk 95 %     Max Ex. HR 156 bpm     Max Ex. BP 128/62     2 Minute Post BP 108/64              Oxygen Initial Assessment:   Oxygen Re-Evaluation:   Oxygen Discharge (Final Oxygen Re-Evaluation):   Initial Exercise Prescription:  Initial Exercise Prescription - 01/13/23 1600       Date of Initial Exercise RX and Referring Provider   Date 10/24/22    Referring Provider Lelon Huh MD      Oxygen   Maintain Oxygen Saturation 88% or higher      Recumbant Bike   Level 1    RPM 60    Watts 10    Minutes 15    METs 1      NuStep   Level 1    SPM 80    Minutes 15    METs 1      T5 Nustep   Level 1    SPM 80    Minutes 15    METs  1      Track   Laps 8    Minutes 15    METs 1.44      Prescription Details   Frequency (times per week) 2    Duration Progress to 30 minutes of continuous aerobic without signs/symptoms of physical distress      Intensity   THRR 40-80% of Max Heartrate 118-134    Ratings of Perceived Exertion 11-13    Perceived Dyspnea 0-4      Progression   Progression Continue to progress workloads to maintain intensity without signs/symptoms of physical distress.      Resistance Training   Training Prescription Yes     Weight 2 lb    Reps 10-15             Perform Capillary Blood Glucose checks as needed.  Exercise Prescription Changes:   Exercise Prescription Changes     Row Name 01/13/23 1600 01/28/23 1500           Response to Exercise   Blood Pressure (Admit) 104/58 104/56      Blood Pressure (Exercise) 128/62 132/64      Blood Pressure (Exit) 108/64 110/58      Heart Rate (Admit) 102 bpm 70 bpm      Heart Rate (Exercise) 156 bpm 103 bpm      Heart Rate (Exit) 98 bpm 81 bpm      Oxygen Saturation (Admit) 96 % --      Oxygen Saturation (Exercise) 95 % --      Rating of Perceived Exertion (Exercise) 13 15      Perceived Dyspnea (Exercise) 0 --      Symptoms Legs fatigued none      Comments 6MWT Results 1st week back after being out since December      Duration -- Progress to 30 minutes of  aerobic without signs/symptoms of physical distress      Intensity -- THRR unchanged        Progression   Progression -- Continue to progress workloads to maintain intensity without signs/symptoms of physical distress.      Average METs -- 1.78        Resistance Training   Training Prescription -- Yes      Weight -- 3 lb      Reps -- 10-15        T5 Nustep   Level -- 2      Minutes -- 15      METs -- 2        Track   Laps -- 16      Minutes -- 15      METs -- 1.87        Oxygen   Maintain Oxygen Saturation -- 88% or higher               Exercise Comments:   Exercise Goals and Review:   Exercise Goals Re-Evaluation :  Exercise Goals Re-Evaluation     Row Name 12/05/22 1201 01/28/23 1534           Exercise Goal Re-Evaluation   Exercise Goals Review Increase Physical Activity;Increase Strength and Stamina;Understanding of Exercise Prescription Increase Physical Activity;Increase Strength and Stamina;Understanding of Exercise Prescription      Comments Beverley has been recently discharged from the hospital, having rhinovirus. He did let us know that he was still  wasn't feeling the best and will let us know when he feels ready to come back. Will continue to update  with patient. Hasten has returned back to rehab after being out since December after a hospital admission. Upon return, he was able to walk 16 laps on the track his first day back. He has also increased to level 2 on the T5 Nustep and now using 3 lb for handweights. We will continue to monitor as he continues to progress and come consistently.      Expected Outcomes Short: Return to rehab when feeling better Long: Continue to increase overall MET level Short: Maintain good attendance Long: Increase overall strength and stamina               Discharge Exercise Prescription (Final Exercise Prescription Changes):  Exercise Prescription Changes - 01/28/23 1500       Response to Exercise   Blood Pressure (Admit) 104/56    Blood Pressure (Exercise) 132/64    Blood Pressure (Exit) 110/58    Heart Rate (Admit) 70 bpm    Heart Rate (Exercise) 103 bpm    Heart Rate (Exit) 81 bpm    Rating of Perceived Exertion (Exercise) 15    Symptoms none    Comments 1st week back after being out since December    Duration Progress to 30 minutes of  aerobic without signs/symptoms of physical distress    Intensity THRR unchanged      Progression   Progression Continue to progress workloads to maintain intensity without signs/symptoms of physical distress.    Average METs 1.78      Resistance Training   Training Prescription Yes    Weight 3 lb    Reps 10-15      T5 Nustep   Level 2    Minutes 15    METs 2      Track   Laps 16    Minutes 15    METs 1.87      Oxygen   Maintain Oxygen Saturation 88% or higher             Nutrition:  Target Goals: Understanding of nutrition guidelines, daily intake of sodium <1567m, cholesterol <2052m calories 30% from fat and 7% or less from saturated fats, daily to have 5 or more servings of fruits and vegetables.  Education: All About  Nutrition: -Group instruction provided by verbal, written material, interactive activities, discussions, models, and posters to present general guidelines for heart healthy nutrition including fat, fiber, MyPlate, the role of sodium in heart healthy nutrition, utilization of the nutrition label, and utilization of this knowledge for meal planning. Follow up email sent as well. Written material given at graduation. [facility]  Education need identified 10/24/22       Biometrics:  Pre Biometrics - 01/13/23 1646       Pre Biometrics   Height 5' 6.5" (1.689 m)    Weight 224 lb 8 oz (101.8 kg)    BMI (Calculated) 35.7    Single Leg Stand 0 seconds              Nutrition Therapy Plan and Nutrition Goals:   Nutrition Assessments:  MEDIFICTS Score Key: ?70 Need to make dietary changes  40-70 Heart Healthy Diet ? 40 Therapeutic Level Cholesterol Diet  [facility]  Picture Your Plate Total Score on Admission 51      Picture Your Plate Scores: <[id number] dietary pattern with much room for improvement. 41-50 Dietary pattern unlikely to meet  recommendations for good health and room for improvement. 51-60 More healthful dietary pattern, with some room for improvement.  >60 Healthy dietary pattern, although there may be some specific behaviors that could be improved.    Nutrition Goals Re-Evaluation:   Nutrition Goals Discharge (Final Nutrition Goals Re-Evaluation):   Psychosocial: Target Goals: Acknowledge presence or absence of significant depression and/or stress, maximize coping skills, provide positive support system. Participant is able to verbalize types and ability to use techniques and skills needed for reducing stress and depression.   Education: Stress, Anxiety, and Depression - Group verbal and visual presentation to define  topics covered.  Reviews how body is impacted by stress, anxiety, and depression.  Also discusses healthy ways to reduce stress and to treat/manage anxiety and depression.  Written material given at graduation.   Education: Sleep Hygiene -Provides group verbal and written instruction about how sleep can affect your health.  Define sleep hygiene, discuss sleep cycles and impact of sleep habits. Review good sleep hygiene tips.    Initial Review & Psychosocial Screening:   Quality of Life Scores:   Scores of 19 and below usually indicate a poorer quality of life in these areas.  A difference of  2-3 points is a clinically meaningful difference.  A difference of 2-3 points in the total score of the Quality of Life Index has been associated with significant improvement in overall quality of life, self-image, physical symptoms, and general health in studies assessing change in quality of life.  PHQ-9: Review Flowsheet  More data exists      01/06/2023 10/24/2022 01/03/2022 07/18/2021 12/28/2020  Depression screen PHQ 2/9  Decreased Interest 0 0 0 2 0  Down, Depressed, Hopeless 0 1 0 1 0  PHQ - 2 Score 0 1 0 3 0  Altered sleeping 0 0 - 3 -  Tired, decreased energy 0 1 - 3 -  Change in appetite 0 1 - 3 -  Feeling bad or failure about yourself  0 0 - 0 -  Trouble concentrating 0 1 - 0 -  Moving slowly or fidgety/restless 0 0 - 0 -  Suicidal thoughts 0 0 - 0 -  PHQ-9 Score 0 4 - 12 -  Difficult doing work/chores Not difficult at all Somewhat difficult - Not difficult at all -   Interpretation of Total Score  Total Score Depression Severity:  1-4 = Minimal depression, 5-9 = Mild depression, 10-14 = Moderate depression, 15-19 = Moderately severe depression, 20-27 = Severe depression   Psychosocial Evaluation and Intervention:   Psychosocial Re-Evaluation:   Psychosocial Discharge (Final Psychosocial Re-Evaluation):   Vocational Rehabilitation: Provide vocational rehab assistance to  qualifying candidates.   Vocational Rehab Evaluation & Intervention:   Education: Education Goals: Education classes will be provided on a variety of topics geared toward better understanding of heart health and risk factor modification. Participant will state understanding/return demonstration of topics presented as noted by education test scores.  Learning Barriers/Preferences:   General Cardiac Education Topics:  AED/CPR: - Group verbal and written instruction with the use of models to demonstrate the basic use of the AED with the basic ABC's of resuscitation.   Anatomy and Cardiac Procedures: - Group verbal and visual presentation and models provide information about basic cardiac anatomy and function. Reviews the testing methods done to diagnose heart disease and the outcomes of the test results. Describes the treatment choices: Medical Management, Angioplasty, or Coronary Bypass Surgery for treating various heart conditions including Myocardial Infarction, Angina, Valve  Disease, and Cardiac Arrhythmias.  Written material given at graduation. Flowsheet Row Cardiac Rehab from 10/24/2022 in [facility] Cardiac and Pulmonary Rehab  Education need identified 10/24/22       Medication Safety: - Group verbal and visual instruction to review commonly prescribed medications for heart and lung disease. Reviews the medication, class of the drug, and side effects. Includes the steps to properly store meds and maintain the prescription regimen.  Written material given at graduation.   Intimacy: - Group verbal instruction through game format to discuss how heart and lung disease can affect sexual intimacy. Written material given at graduation..   Know Your Numbers and Heart Failure: - Group verbal and visual instruction to discuss disease risk factors for cardiac and pulmonary disease and treatment options.  Reviews associated critical values for Overweight/Obesity, Hypertension, Cholesterol, and  Diabetes.  Discusses basics of heart failure: signs/symptoms and treatments.  Introduces Heart Failure Zone chart for action plan for heart failure.  Written material given at graduation.   Infection Prevention: - Provides verbal and written material to individual with discussion of infection control including proper hand washing and proper equipment cleaning during exercise session. Flowsheet Row Cardiac Rehab from 10/24/2022 in [facility] Cardiac and Pulmonary Rehab  Education need identified 10/24/22  Date 10/24/22  Educator [place]  Instruction Review Code 1- Verbalizes Understanding       Falls Prevention: - Provides verbal and written material to individual with discussion of falls prevention and safety. Flowsheet Row Cardiac Rehab from 10/16/2022 in [facility] Cardiac and Pulmonary Rehab  Date 10/16/22  Educator SB  Instruction Review Code 1- Verbalizes Understanding       Other: -Provides group and verbal instruction on various topics (see comments)   Knowledge Questionnaire Score:   Core Components/Risk Factors/Patient Goals at Admission:  Personal Goals and Risk Factors at Admission - 01/13/23 1653       Core Components/Risk Factors/Patient Goals on Admission    Weight Management Yes;Weight Loss;Obesity    Intervention Weight Management: Develop a combined nutrition and exercise program designed to reach desired caloric intake, while maintaining appropriate intake of nutrient and fiber, sodium and fats, and appropriate energy expenditure required for the weight goal.;Weight Management/Obesity: Establish reasonable short term and long term weight goals.;Weight Management: Provide education and appropriate resources to help participant work on and attain dietary goals.    Admit Weight 224 lb 8 oz (101.8 kg)    Goal Weight: Long Term 200 lb (90.7 kg)    Expected Outcomes Short Term: Continue to assess and modify interventions until short term weight is achieved;Long Term: Adherence to  nutrition and physical activity/exercise program aimed toward attainment of established weight goal;Weight Loss: Understanding of general recommendations for a balanced deficit meal plan, which promotes 1-2 lb weight loss per week and includes a negative energy balance of [phone] kcal/d;Understanding recommendations for meals to include 15-35% energy as protein, 25-35% energy from fat, 35-60% energy from carbohydrates, less than 261m of dietary cholesterol, 20-35 gm of total fiber daily;Understanding of distribution of calorie intake throughout the day with the consumption of 4-5 meals/snacks    Diabetes Yes    Intervention Provide education about signs/symptoms and action to take for hypo/hyperglycemia.;Provide education about proper nutrition, including hydration, and aerobic/resistive exercise prescription along with prescribed medications to achieve blood glucose in normal ranges: Fasting glucose 65-99 mg/dL    Expected Outcomes Short Term: Participant verbalizes understanding of the signs/symptoms and immediate care of hyper/hypoglycemia, proper foot care and importance of medication, aerobic/resistive exercise  and nutrition plan for blood glucose control.;Long Term: Attainment of HbA1C < 7%.    Heart Failure Yes    Intervention Provide a combined exercise and nutrition program that is supplemented with education, support and counseling about heart failure. Directed toward relieving symptoms such as shortness of breath, decreased exercise tolerance, and extremity edema.    Expected Outcomes Improve functional capacity of life;Short term: Attendance in program 2-3 days a week with increased exercise capacity. Reported lower sodium intake. Reported increased fruit and vegetable intake. Reports medication compliance.;Short term: Daily weights obtained and reported for increase. Utilizing diuretic protocols set by physician.;Long term: Adoption of self-care skills and reduction of barriers for early signs  and symptoms recognition and intervention leading to self-care maintenance.    Hypertension Yes    Intervention Provide education on lifestyle modifcations including regular physical activity/exercise, weight management, moderate sodium restriction and increased consumption of fresh fruit, vegetables, and low fat dairy, alcohol moderation, and smoking cessation.;Monitor prescription use compliance.    Expected Outcomes Short Term: Continued assessment and intervention until BP is < 140/8m HG in hypertensive participants. < 130/836mHG in hypertensive participants with diabetes, heart failure or chronic kidney disease.;Long Term: Maintenance of blood pressure at goal levels.    Lipids Yes    Intervention Provide education and support for participant on nutrition & aerobic/resistive exercise along with prescribed medications to achieve LDL <7076mHDL >75m77m  Expected Outcomes Short Term: Participant states understanding of desired cholesterol values and is compliant with medications prescribed. Participant is following exercise prescription and nutrition guidelines.;Long Term: Cholesterol controlled with medications as prescribed, with individualized exercise RX and with personalized nutrition plan. Value goals: LDL < 70mg37mL > 40 mg.             Education:Diabetes - Individual verbal and written instruction to review signs/symptoms of diabetes, desired ranges of glucose level fasting, after meals and with exercise. Acknowledge that pre and post exercise glucose checks will be done for 3 sessions at entry of program. [place] [facility]  Education need identified 10/24/22  Date 10/24/22  Educator KL  [place] Review Code 1- Verbalizes Understanding       Core Components/Risk Factors/Patient Goals Review:    Core Components/Risk Factors/Patient Goals at Discharge (Final Review):    ITP Comments:  ITP Comments     Row  [place] 12/04/22 1000 12/23/22 1056 01/01/23 0911 01/06/23 1404 01/13/23 1638   ITP Comments 30 Day review completed. Medical Director ITP review done, changes made as directed, and signed approval by Medical Director. Attempted to call patient again to receive any update on how patient is feeling. Left voicemail x2. Has not attended rehab since 11/22. Will send letter at this time. 30 Day review completed. Medical Director ITP review done, changes made as directed, and signed approval by Medical Director.   still no visits since 11/22 Sent patient letter and did not hear back from him.  We will discharge him at this time. Discharge ITP sent to Medical Director. Patient returned to cardiac rehab after insurance change.  6 minute walk test performed and updated ITP created.    [place] 01/29/23 1222           ITP Comments 30 day review completed. ITP sent to Dr. Mark Emily Filbertical Director of Cardiac Rehab. Continue with ITP unless changes are made by physician.  Comments: 30 day review

## 2023-01-29 NOTE — ED Notes (Signed)
Pt return from CT scanner, NAD noted, pt alert

## 2023-01-29 NOTE — Progress Notes (Signed)
Daily Session Note  Patient Details  Name: KHAIRI CARON MRN: [id number] Date of Birth: 07/24/1946 Referring Provider:   Flowsheet Row Cardiac Rehab from 01/13/2023 in [facility] Cardiac and Pulmonary Rehab  Referring Provider Lelon Huh MD       Encounter Date: 01/29/2023  Check In:  Session Check In - 01/29/23 1611       Check-In   Supervising physician immediately available to respond to emergencies See telemetry face sheet for immediately available ER MD    Location [facility]-Cardiac & Pulmonary Rehab    Staff Present Renita Papa, RN BSN;Joseph [place]land, RCP,RRT,BSRT;Kelly [place], [place], ACSM CEP, Exercise Physiologist    Virtual Visit No    Medication changes reported     No    Fall or balance concerns reported    No    Warm-up and Cool-down Performed on first and last piece of equipment    Resistance Training Performed Yes    VAD Patient? No    PAD/SET Patient? No      Pain Assessment   Currently in Pain? No/denies                Social History   Tobacco Use  Smoking Status Former   Types: Cigars   Quit date: 04/08/2012   Years since quitting: 10.8  Smokeless Tobacco Never  Tobacco Comments   cigarettes and cigars  all quit 12 years ago    Goals Met:  Independence with exercise equipment Exercise tolerated well No report of concerns or symptoms today Strength training completed today  Goals Unmet:  Not Applicable  Comments: Pt able to follow exercise prescription today without complaint.  Will continue to monitor for progression.    Dr. Emily Filbert is Medical Director for [place].  Dr. Ottie Glazier is Medical Director for [facility] Pulmonary Rehabilitation.

## 2023-01-29 NOTE — Progress Notes (Signed)
Orthopedic Tech Progress Note Patient Details:  Sean Hayden [birth year] [id number]  Level 2 trauma   Patient ID: Sean Hayden, male   DOB: September 13, 1946, 77 y.o.   MRN: [id number]  Sean Hayden 01/29/2023, 9:35 PM

## 2023-01-29 NOTE — ED Notes (Signed)
This RN called to main lab, they have received the blood sent down earlier and will run the CBC and CMP at this time

## 2023-01-29 NOTE — ED Triage Notes (Addendum)
Pt arrived from [place] after tripping over curb in parking lot, pt fell face down from standing position and hit his face. No LOC, Pt is on Xarelto. Bleeding controlled to face, pt with noticeable abrasion to right side of face and bridge of nose, Pt has hematoma above the right eye. Pt A&O x4, GCS 15, VSS.

## 2023-01-29 NOTE — ED Notes (Signed)
Trauma Response Nurse Documentation   Sean Hayden is a 77 y.o. male arriving to [facility] via [facility]  On Xarelto (rivaroxaban) daily. Trauma was activated as a Level 2 by Karlyne Greenspan based on the following trauma criteria Elderly patients > 65 with head trauma on anti-coagulation (excluding ASA). Trauma team at the bedside on patient arrival.   Patient cleared for CT by Dr. Armandina Gemma. Pt transported to CT with trauma response nurse present to monitor. RN remained with the patient throughout their absence from the department for clinical observation.   GCS 15.  History   Past Medical History:  Diagnosis Date   Aortic stenosis    moderate by 02/2016 echo   Aortic valve stenosis, congenital    Moderate by echo 2017   B12 deficiency    Cancer ([name])    skin cancer - face    CVA (cerebral vascular accident) ([name])    02/2016 no residual   Diabetes mellitus without complication ([name])    Gout    Hemorrhoids    History of kidney stones    Hyperlipidemia    Hyperplastic colon polyp    Hypertension    MRSA infection    Neuropathy    PAF (paroxysmal atrial fibrillation) ([place])    Personal history of urinary calculi 05/25/2015   Pneumonia    1990's   Seizures ([name])      Past Surgical History:  Procedure Laterality Date   Arm fracture Left    CAROTID ARTERY ANGIOPLASTY Right 04/17/2012   Dr. Delana Meyer, [name] Right 04/17/2012   Dr. Delana Meyer, [name]  2005   lap   COLONOSCOPY     CYST EXCISION     "between legs"   EP IMPLANTABLE DEVICE N/A 02/23/2016   Procedure: Loop Recorder Insertion;  Surgeon: Thompson Grayer, MD;  Location: [name];  Service: Cardiovascular;  Laterality: N/A;   HEMORRHOID SURGERY  2009   INCISION AND DRAINAGE WOUND WITH NERVE AND ARTERY REPAIR Left 10/04/2017   Procedure: LEFT HAND WOUND EXPLORATION repair as necessary;  Surgeon: Iran Planas, MD;  Location: [name];  Service: Orthopedics;   Laterality: Left;   MOHS SURGERY Right 12/24/2022   - Ishmael Holter, MD   TEE WITHOUT CARDIOVERSION N/A 02/23/2016   Procedure: TRANSESOPHAGEAL ECHOCARDIOGRAM (TEE);  Surgeon: Pixie Casino, MD;  Location: [name];  Service: Cardiovascular;  Laterality: N/A;  ALSO GETTING A LOOP   TRANSCATHETER AORTIC VALVE REPLACEMENT, TRANSFEMORAL  09/09/2022   Dr. Jimmye Norman at [name]       Initial Focused Assessment (If applicable, or please see trauma documentation): Airway-- intact, no visible obstruction Breathing-- spontaneous, unlabored Circulation-- abrasions to forehead and nose, bleeding controlled on arrival.  CT's Completed:   CT Head, CT Maxillofacial, and CT C-Spine   Interventions:  See event summary  Plan for disposition:  Discharge home   Consults completed:  none at 2300.  Event Summary: Patient brought in by [name]. Patient tripped over a curb in a parking lot and struck his face. Patient arrived alert and oriented, GCS 15. Abrasions to forehead and nose, bleeding controlled on arrival. Patient with hematoma above right eye. Manual BP obtained. 18 G PIV established. Trauma labs obtained. Xray chest and left hand completed. CT head, c-spine, maxillofacial completed.    Bedside handoff with ED RN [place].    Trudee Kuster  Trauma Response RN  Please call TRN at [phone] for further  assistance.

## 2023-02-03 ENCOUNTER — Ambulatory Visit: Payer: Medicare HMO

## 2023-02-04 ENCOUNTER — Telehealth: Payer: Self-pay

## 2023-02-04 NOTE — Telephone Encounter (Signed)
Called Sean Hayden to check in as we have not heard from him since ED visit 01/29/23.

## 2023-02-05 ENCOUNTER — Encounter: Payer: No Typology Code available for payment source | Admitting: *Deleted

## 2023-02-05 ENCOUNTER — Ambulatory Visit: Payer: Medicare HMO

## 2023-02-10 ENCOUNTER — Ambulatory Visit: Payer: Medicare HMO

## 2023-02-12 ENCOUNTER — Ambulatory Visit: Payer: Medicare HMO

## 2023-02-12 ENCOUNTER — Telehealth: Payer: Self-pay

## 2023-02-12 DIAGNOSIS — Z952 Presence of prosthetic heart valve: Secondary | ICD-10-CM

## 2023-02-12 NOTE — Telephone Encounter (Signed)
Spoke with patient- states he has been trying to recover from his fall- caused by tripping. Feels sore but was told he did not break anything. Sees his doctor tomorrow for a follow up and will ask if he has any limitations. Due to come back to rehab on 3/11 unless told otherwise.

## 2023-02-13 DIAGNOSIS — Z952 Presence of prosthetic heart valve: Secondary | ICD-10-CM | POA: Diagnosis not present

## 2023-02-13 DIAGNOSIS — I428 Other cardiomyopathies: Secondary | ICD-10-CM | POA: Diagnosis not present

## 2023-02-13 DIAGNOSIS — I480 Paroxysmal atrial fibrillation: Secondary | ICD-10-CM | POA: Diagnosis not present

## 2023-02-13 DIAGNOSIS — I10 Essential (primary) hypertension: Secondary | ICD-10-CM | POA: Diagnosis not present

## 2023-02-17 ENCOUNTER — Encounter: Payer: No Typology Code available for payment source | Attending: Family Medicine | Admitting: *Deleted

## 2023-02-17 ENCOUNTER — Ambulatory Visit: Payer: Medicare HMO

## 2023-02-17 DIAGNOSIS — Z952 Presence of prosthetic heart valve: Secondary | ICD-10-CM | POA: Insufficient documentation

## 2023-02-17 DIAGNOSIS — D3702 Neoplasm of uncertain behavior of tongue: Secondary | ICD-10-CM | POA: Diagnosis not present

## 2023-02-19 ENCOUNTER — Encounter: Payer: No Typology Code available for payment source | Admitting: *Deleted

## 2023-02-19 ENCOUNTER — Ambulatory Visit: Payer: Medicare HMO

## 2023-02-19 DIAGNOSIS — Z952 Presence of prosthetic heart valve: Secondary | ICD-10-CM | POA: Diagnosis present

## 2023-02-19 NOTE — Progress Notes (Signed)
Daily Session Note  Patient Details  Name: Sean Hayden MRN: [id number] Date of Birth: 09/18/1946 Referring Provider:   Flowsheet Row Cardiac Rehab from 01/13/2023 in [facility] Cardiac and Pulmonary Rehab  Referring Provider Lelon Huh MD       Encounter Date: 02/19/2023  Check In:  Session Check In - 02/19/23 1613       Check-In   Supervising physician immediately available to respond to emergencies See telemetry face sheet for immediately available ER MD    Location [facility]-Cardiac & Pulmonary Rehab    Staff Present Renita Papa, RN BSN;Megan Tamala Julian, RN, Terie Purser, RCP,RRT,BSRT    Virtual Visit No    Medication changes reported     No    Fall or balance concerns reported    Yes    Comments contussion on face, evaluated by ED Doc    Warm-up and Cool-down Performed on first and last piece of equipment    Resistance Training Performed Yes    VAD Patient? No    PAD/SET Patient? No      Pain Assessment   Currently in Pain? No/denies                Social History   Tobacco Use  Smoking Status Former   Types: Cigars   Quit date: 04/08/2012   Years since quitting: 10.8  Smokeless Tobacco Never  Tobacco Comments   cigarettes and cigars  all quit 12 years ago    Goals Met:  Independence with exercise equipment Exercise tolerated well No report of concerns or symptoms today Strength training completed today  Goals Unmet:  Not Applicable  Comments: Pt able to follow exercise prescription today without complaint.  Will continue to monitor for progression.    Dr. Emily Filbert is Medical Director for [place].  Dr. Ottie Glazier is Medical Director for [facility] Pulmonary Rehabilitation.

## 2023-02-24 ENCOUNTER — Ambulatory Visit: Payer: Medicare HMO

## 2023-02-24 ENCOUNTER — Encounter: Payer: No Typology Code available for payment source | Admitting: *Deleted

## 2023-02-24 DIAGNOSIS — Z952 Presence of prosthetic heart valve: Secondary | ICD-10-CM

## 2023-02-24 NOTE — Progress Notes (Signed)
Daily Session Note  Patient Details  Name: Sean Hayden MRN: [id number] Date of Birth: [birth year] Referring Provider:   Flowsheet Row Cardiac Rehab from 01/13/2023 in [facility] Cardiac and Pulmonary Rehab  Referring Provider Lelon Huh MD       Encounter Date: 02/24/2023  Check In:  Session Check In - 02/24/23 1609       Check-In   Supervising physician immediately available to respond to emergencies See telemetry face sheet for immediately available ER MD    Staff Present Renita Papa, RN BSN;Noah Tickle, BS, Exercise Physiologist;Joseph Tessie Fass, [place]    Virtual Visit No    Medication changes reported     No    Fall or balance concerns reported    No    Warm-up and Cool-down Performed on first and last piece of equipment    Resistance Training Performed Yes    VAD Patient? No    PAD/SET Patient? No      Pain Assessment   Currently in Pain? No/denies                Social History   Tobacco Use  Smoking Status Former   Types: Cigars   Quit date: 04/08/2012   Years since quitting: 10.8  Smokeless Tobacco Never  Tobacco Comments   cigarettes and cigars  all quit 12 years ago    Goals Met:  Independence with exercise equipment Exercise tolerated well No report of concerns or symptoms today Strength training completed today  Goals Unmet:  Not Applicable  Comments: Pt able to follow exercise prescription today without complaint.  Will continue to monitor for progression.    Dr. Emily Filbert is Medical Director for [place].  Dr. Ottie Glazier is Medical Director for [facility] Pulmonary Rehabilitation.

## 2023-02-26 ENCOUNTER — Encounter: Payer: No Typology Code available for payment source | Admitting: *Deleted

## 2023-02-26 ENCOUNTER — Ambulatory Visit: Payer: Medicare HMO

## 2023-02-26 ENCOUNTER — Encounter: Payer: Self-pay | Admitting: *Deleted

## 2023-02-26 DIAGNOSIS — Z952 Presence of prosthetic heart valve: Secondary | ICD-10-CM

## 2023-02-26 NOTE — Progress Notes (Signed)
Daily Session Note  Patient Details  Name: Sean Hayden MRN: [id number] Date of Birth: [birth year] Referring Provider:   Flowsheet Row Cardiac Rehab from 01/13/2023 in [facility] Cardiac and Pulmonary Rehab  Referring Provider Lelon Huh MD       Encounter Date: 02/26/2023  Check In:  Session Check In - 02/26/23 1607       Check-In   Supervising physician immediately available to respond to emergencies See telemetry face sheet for immediately available ER MD    Location [facility]-Cardiac & Pulmonary Rehab    Staff Present Renita Papa, RN BSN;Joseph Tessie Fass, Ernestina Patches, RN, [place]    Virtual Visit No    Medication changes reported     No    Fall or balance concerns reported    No    Warm-up and Cool-down Performed on first and last piece of equipment    Resistance Training Performed Yes    VAD Patient? No    PAD/SET Patient? No      Pain Assessment   Currently in Pain? No/denies                Social History   Tobacco Use  Smoking Status Former   Types: Cigars   Quit date: 04/08/2012   Years since quitting: 10.8  Smokeless Tobacco Never  Tobacco Comments   cigarettes and cigars  all quit 12 years ago    Goals Met:  Independence with exercise equipment Exercise tolerated well No report of concerns or symptoms today Strength training completed today  Goals Unmet:  Not Applicable  Comments: Pt able to follow exercise prescription today without complaint.  Will continue to monitor for progression.    Dr. Emily Filbert is Medical Director for [place].  Dr. Ottie Glazier is Medical Director for [facility] Pulmonary Rehabilitation.

## 2023-02-26 NOTE — Progress Notes (Signed)
Cardiac Individual Treatment Plan  Patient Details  Name: Sean Hayden MRN: [id number] Date of Birth: 1946-11-08 Referring Provider:   Flowsheet Row Cardiac Rehab from 01/13/2023 in [facility] Cardiac and Pulmonary Rehab  Referring Provider Lelon Huh MD       Initial Encounter Date:  Flowsheet Row Cardiac Rehab from 01/13/2023 in [facility] Cardiac and Pulmonary Rehab  Date 10/24/22       Visit Diagnosis: S/P TAVR (transcatheter aortic valve replacement)  Patient's Home Medications on Admission:  Current Outpatient Medications:    acyclovir (ZOVIRAX) 400 MG tablet, 1/2 tablet up to 5 times a day for cold sores, Disp: 30 tablet, Rfl: 5   albuterol (VENTOLIN HFA) 108 (90 Base) MCG/ACT inhaler, Inhale 2 puffs into the lungs every 6 (six) hours as needed for wheezing or shortness of breath., Disp: , Rfl:    atorvastatin (LIPITOR) 20 MG tablet, Take 1 tablet (20 mg total) by mouth every evening. (Patient taking differently: Take 40 mg by mouth every evening.), Disp: 90 tablet, Rfl: 3   benzocaine (ORAJEL) 10 % mucosal gel, Use as directed 1 application in the mouth or throat as needed for mouth pain., Disp: , Rfl:    cephALEXin (KEFLEX) 500 MG capsule, TAKE ONE CAPSULE BY MOUTH FOUR TIMES A DAY FOR BACTERIAL INFECTION (Patient not taking: Reported on 01/06/2023), Disp: , Rfl:    Cholecalciferol (VITAMIN D PO), Take 2,000 Units by mouth daily. , Disp: , Rfl:    Docosanol 10 % CREA, Apply topically up to 5 times per day as needed until heals, Disp: 2 g, Rfl: 3   empagliflozin (JARDIANCE) 25 MG TABS tablet, Take 25 mg by mouth daily., Disp: 90 tablet, Rfl: 4   Flaxseed, Linseed, (FLAXSEED OIL) 1000 MG CAPS, Take 1,000 mg by mouth 2 (two) times daily., Disp: , Rfl:    fluticasone (FLONASE) 50 MCG/ACT nasal spray, Place 2 sprays into both nostrils daily. (Patient taking differently: Place 2 sprays into both nostrils 2 (two) times daily.), Disp: 16 g, Rfl: 1   furosemide (LASIX) 20 MG tablet, TAKE ONE  (1) TABLET BY MOUTH ONCE DAILY AS NEEDED FOR EDEMA (TAKE 1 TABLET DAILYAS NEEDED FOR WEIGHT GAIN > 2 POUNDS IN 24 HOURS), Disp: 30 tablet, Rfl: 1   gabapentin (NEURONTIN) 100 MG capsule, Take 1 capsule (100 mg total) by mouth 2 (two) times daily., Disp: 180 capsule, Rfl: 1   HYDROcodone-acetaminophen (NORCO/VICODIN) 5-325 MG tablet, Take 1 tablet by mouth every 4 (four) hours as needed for moderate pain., Disp: 12 tablet, Rfl: 0   insulin detemir (LEVEMIR) 100 UNIT/ML injection, Inject 0.3 mLs (30 Units total) into the skin 2 (two) times daily. (Patient taking differently: Inject 10 Units into the skin 2 (two) times daily.), Disp: 100 mL, Rfl: 1   levETIRAcetam (KEPPRA) 750 MG tablet, Take 2 tablets (1,500 mg total) by mouth 2 (two) times daily. 3 in the am and 3 at bedtime (Patient not taking: Reported on 01/06/2023), Disp: 360 tablet, Rfl: 4   lisinopril (ZESTRIL) 2.5 MG tablet, Take 5 mg by mouth daily., Disp: , Rfl:    loratadine (CLARITIN) 10 MG tablet, Take 10 mg by mouth daily.  (Patient not taking: Reported on 01/06/2023), Disp: , Rfl:    metFORMIN (GLUCOPHAGE) 850 MG tablet, Take 1 tablet (850 mg total) by mouth 2 (two) times daily with a meal., Disp: 180 tablet, Rfl: 1   metoprolol tartrate (LOPRESSOR) 25 MG tablet, Take 0.5 tablets by mouth every 12 (twelve) hours., Disp: ,  Rfl:    Misc Natural Products (OSTEO BI-FLEX TRIPLE STRENGTH PO), Take 1 tablet by mouth 2 (two) times daily. , Disp: , Rfl:    MISC NATURAL PRODUCTS PO, Take 1 capsule by mouth in the morning and at bedtime. Super Beta Prostate, Disp: , Rfl:    montelukast (SINGULAIR) 10 MG tablet, TAKE ONE TABLET EVERY DAY FOR CHRONIC COUGH (Patient not taking: Reported on 01/06/2023), Disp: 30 tablet, Rfl: 12   Omega-3 Fatty Acids (FISH OIL) 1000 MG CPDR, Take 1,200 mg by mouth 2 (two) times daily. , Disp: , Rfl:    PARoxetine (PAXIL) 40 MG tablet, TAKE 1 TABLET BY MOUTH DAILY., Disp: 30 tablet, Rfl: 0   rivaroxaban (XARELTO) 20 MG TABS  tablet, TAKE ONE TABLET BY MOUTH DAILY WITH SUPPER, Disp: 30 tablet, Rfl: 12   tamsulosin (FLOMAX) 0.4 MG CAPS capsule, Take 1 capsule by mouth daily. (Patient not taking: Reported on 01/06/2023), Disp: , Rfl:    zolpidem (AMBIEN) 10 MG tablet, TAKE 1/2-1 TABLET BY MOUTH AT BEDTIME. (Patient not taking: Reported on 10/16/2022), Disp: 90 tablet, Rfl: 3  Past Medical History: Past Medical History:  Diagnosis Date   Aortic stenosis    moderate by 02/2016 echo   Aortic valve stenosis, congenital    Moderate by echo 2017   B12 deficiency    Cancer (HCC)    skin cancer - face    CVA (cerebral vascular accident) ([place])    02/2016 no residual   Diabetes mellitus without complication ([place])    Gout    Hemorrhoids    History of kidney stones    Hyperlipidemia    Hyperplastic colon polyp    Hypertension    MRSA infection    Neuropathy    PAF (paroxysmal atrial fibrillation) ([place])    Personal history of urinary calculi 05/25/2015   Pneumonia    1990's   Seizures ([place])     Tobacco Use: Social History   Tobacco Use  Smoking Status Former   Types: Cigars   Quit date: 04/08/2012   Years since quitting: 10.8  Smokeless Tobacco Never  Tobacco Comments   cigarettes and cigars  all quit 12 years ago    Labs: Review Flowsheet  More data exists      Latest Ref Rng & Units 02/07/2020 05/16/2020 07/18/2021 01/25/2022 05/20/2022  Labs for ITP Cardiac and Pulmonary Rehab  Cholestrol 100 - 199 mg/dL 123     - 122  111  -  LDL (calc) 0 - 99 mg/dL 49     - 51  45  -  HDL-C >39 mg/dL 37     - 34  33  -  Trlycerides 0 - 149 mg/dL 184     - 231  200  -  Hemoglobin A1c 4.0 - 5.6 % 7.7     7.6  8.9  8.5  7.6     Details       This result is from an external source.          Exercise Target Goals: Exercise Program Goal: Individual exercise prescription set using results from initial 6 min walk test and THRR while considering  patient's activity barriers and safety.   Exercise Prescription  Goal: Initial exercise prescription builds to 30-45 minutes a day of aerobic activity, 2-3 days per week.  Home exercise guidelines will be given to patient during program as part of exercise prescription that the participant will acknowledge.   Education: Aerobic Exercise: - Group verbal  and visual presentation on the components of exercise prescription. Introduces F.I.T.T principle from ACSM for exercise prescriptions.  Reviews F.I.T.T. principles of aerobic exercise including progression. Written material given at graduation. Flowsheet Row Cardiac Rehab from 10/24/2022 in [facility] Cardiac and Pulmonary Rehab  Education need identified 10/24/22       Education: Resistance Exercise: - Group verbal and visual presentation on the components of exercise prescription. Introduces F.I.T.T principle from ACSM for exercise prescriptions  Reviews F.I.T.T. principles of resistance exercise including progression. Written material given at graduation.    Education: Exercise & Equipment Safety: - Individual verbal instruction and demonstration of equipment use and safety with use of the equipment. Flowsheet Row Cardiac Rehab from 10/24/2022 in [facility] Cardiac and Pulmonary Rehab  Education need identified 10/24/22  Date 10/24/22  Educator [place]  Instruction Review Code 1- Verbalizes Understanding       Education: Exercise Physiology & General Exercise Guidelines: - Group verbal and written instruction with models to review the exercise physiology of the cardiovascular system and associated critical values. Provides general exercise guidelines with specific guidelines to those with heart or lung disease.  Flowsheet Row Cardiac Rehab from 10/24/2022 in [facility] Cardiac and Pulmonary Rehab  Education need identified 10/24/22       Education: Flexibility, Balance, Mind/Body Relaxation: - Group verbal and visual presentation with interactive activity on the components of exercise prescription. Introduces  F.I.T.T principle from ACSM for exercise prescriptions. Reviews F.I.T.T. principles of flexibility and balance exercise training including progression. Also discusses the mind body connection.  Reviews various relaxation techniques to help reduce and manage stress (i.e. Deep breathing, progressive muscle relaxation, and visualization). Balance handout provided to take home. Written material given at graduation.   Activity Barriers & Risk Stratification:   6 Minute Walk:  6 Minute Walk     Row Name 01/13/23 1644         6 Minute Walk   Phase Initial     Distance 657 feet     Walk Time 6 minutes     # of Rest Breaks 0     MPH 1.24     METS 1.43     RPE 13     Perceived Dyspnea  0     VO2 Peak 5     Symptoms Yes (comment)     Comments Legs fatigued     Resting HR 102 bpm     Resting BP 104/58     Resting Oxygen Saturation  96 %     Exercise Oxygen Saturation  during 6 min walk 95 %     Max Ex. HR 156 bpm     Max Ex. BP 128/62     2 Minute Post BP 108/64              Oxygen Initial Assessment:   Oxygen Re-Evaluation:   Oxygen Discharge (Final Oxygen Re-Evaluation):   Initial Exercise Prescription:  Initial Exercise Prescription - 01/13/23 1600       Date of Initial Exercise RX and Referring Provider   Date 10/24/22    Referring Provider Lelon Huh MD      Oxygen   Maintain Oxygen Saturation 88% or higher      Recumbant Bike   Level 1    RPM 60    Watts 10    Minutes 15    METs 1      NuStep   Level 1    SPM 80    Minutes 15  METs 1      T5 Nustep   Level 1    SPM 80    Minutes 15    METs 1      Track   Laps 8    Minutes 15    METs 1.44      Prescription Details   Frequency (times per week) 2    Duration Progress to 30 minutes of continuous aerobic without signs/symptoms of physical distress      Intensity   THRR 40-80% of Max Heartrate 118-134    Ratings of Perceived Exertion 11-13    Perceived Dyspnea 0-4      Progression    Progression Continue to progress workloads to maintain intensity without signs/symptoms of physical distress.      Resistance Training   Training Prescription Yes    Weight 2 lb    Reps 10-15             Perform Capillary Blood Glucose checks as needed.  Exercise Prescription Changes:   Exercise Prescription Changes     Row Name 01/13/23 1600 01/28/23 1500 02/12/23 1500 02/25/23 1300       Response to Exercise   Blood Pressure (Admit) 104/58 104/56 98/56 106/60    Blood Pressure (Exercise) 128/62 132/64 114/62 130/60    Blood Pressure (Exit) 108/64 110/58 90/54 102/60    Heart Rate (Admit) 102 bpm 70 bpm 80 bpm 85 bpm    Heart Rate (Exercise) 156 bpm 103 bpm 103 bpm 108 bpm    Heart Rate (Exit) 98 bpm 81 bpm 75 bpm 86 bpm    Oxygen Saturation (Admit) 96 % -- -- --    Oxygen Saturation (Exercise) 95 % -- -- --    Rating of Perceived Exertion (Exercise) 13 15 15 15     Perceived Dyspnea (Exercise) 0 -- -- --    Symptoms Legs fatigued none none none    Comments 6MWT Results 1st week back after being out since December -- return after fall    Duration -- Progress to 30 minutes of  aerobic without signs/symptoms of physical distress Progress to 30 minutes of  aerobic without signs/symptoms of physical distress Continue with 30 min of aerobic exercise without signs/symptoms of physical distress.    Intensity -- THRR unchanged THRR unchanged THRR unchanged      Progression   Progression -- Continue to progress workloads to maintain intensity without signs/symptoms of physical distress. Continue to progress workloads to maintain intensity without signs/symptoms of physical distress. Continue to progress workloads to maintain intensity without signs/symptoms of physical distress.    Average METs -- 1.78 1.75 1.75      Resistance Training   Training Prescription -- Yes Yes Yes    Weight -- 3 lb 3 lb 3 lb    Reps -- 10-15 10-15 10-15      Interval Training   Interval Training  -- -- No No      REL-XR   Level -- -- 1 --    Minutes -- -- 15 --    METs -- -- 2 --      T5 Nustep   Level -- 2 -- 2    Minutes -- 15 -- 15    METs -- 2 -- 2      Track   Laps -- 16 9 --    Minutes -- 15 15 --    METs -- 1.87 1.49 --      Oxygen   Maintain Oxygen Saturation --  88% or higher 88% or higher 88% or higher             Exercise Comments:   Exercise Goals and Review:   Exercise Goals Re-Evaluation :  Exercise Goals Re-Evaluation     Row Name 01/28/23 1534 02/12/23 1532 02/25/23 1330         Exercise Goal Re-Evaluation   Exercise Goals Review Increase Physical Activity;Increase Strength and Stamina;Understanding of Exercise Prescription Increase Physical Activity;Increase Strength and Stamina;Understanding of Exercise Prescription Increase Physical Activity;Increase Strength and Stamina;Understanding of Exercise Prescription     Comments Trenidad has returned back to rehab after being out since December after a hospital admission. Upon return, he was able to walk 16 laps on the track his first day back. He has also increased to level 2 on the T5 Nustep and now using 3 lb for handweights. We will continue to monitor as he continues to progress and come consistently. Jakyri has only attended rehab once since the last review. He was only able to do 9 laps on the track, where he was previously doing 16. He did do well with his first time on the XR at level 1. He also has continued to use 3 lb hand weights for resistance training. We will continue to monitor his progress in the program. Takoma was out for a little while as he had a fall. He has returned and exercised 2 sessions as he is easing back into it. He exercised on level 2 on the T5 Nustep. Will slowly introduce walking back into his regimen when he feels ready. Will continue to monitor.     Expected Outcomes Short: Maintain good attendance Long: Increase overall strength and stamina Short: Attend rehab regularly,  increase laps on the track. Long: Continue to improve strength and stamina. Short: ease back into exercise slowly Long: Continue to increase overall MET level and strength              Discharge Exercise Prescription (Final Exercise Prescription Changes):  Exercise Prescription Changes - 02/25/23 1300       Response to Exercise   Blood Pressure (Admit) 106/60    Blood Pressure (Exercise) 130/60    Blood Pressure (Exit) 102/60    Heart Rate (Admit) 85 bpm    Heart Rate (Exercise) 108 bpm    Heart Rate (Exit) 86 bpm    Rating of Perceived Exertion (Exercise) 15    Symptoms none    Comments return after fall    Duration Continue with 30 min of aerobic exercise without signs/symptoms of physical distress.    Intensity THRR unchanged      Progression   Progression Continue to progress workloads to maintain intensity without signs/symptoms of physical distress.    Average METs 1.75      Resistance Training   Training Prescription Yes    Weight 3 lb    Reps 10-15      Interval Training   Interval Training No      T5 Nustep   Level 2    Minutes 15    METs 2      Oxygen   Maintain Oxygen Saturation 88% or higher             Nutrition:  Target Goals: Understanding of nutrition guidelines, daily intake of sodium 1500mg , cholesterol 200mg , calories 30% from fat and 7% or less from saturated fats, daily to have 5 or more servings of fruits and vegetables.  Education: All About Nutrition: -Group instruction  provided by verbal, written material, interactive activities, discussions, models, and posters to present general guidelines for heart healthy nutrition including fat, fiber, MyPlate, the role of sodium in heart healthy nutrition, utilization of the nutrition label, and utilization of this knowledge for meal planning. Follow up email sent as well. Written material given at graduation. Flowsheet Row Cardiac Rehab from 10/24/2022 in [facility] Cardiac and Pulmonary Rehab   Education need identified 10/24/22       Biometrics:  Pre Biometrics - 01/13/23 1646       Pre Biometrics   Height 5' 6.5" (1.689 m)    Weight 224 lb 8 oz (101.8 kg)    BMI (Calculated) 35.7    Single Leg Stand 0 seconds              Nutrition Therapy Plan and Nutrition Goals:   Nutrition Assessments:  MEDIFICTS Score Key: ?70 Need to make dietary changes  40-70 Heart Healthy Diet ? 40 Therapeutic Level Cholesterol Diet  Flowsheet Row Cardiac Rehab from 10/24/2022 in [facility] Cardiac and Pulmonary Rehab  Picture Your Plate Total Score on Admission 51      Picture Your Plate Scores: [id number] Unhealthy dietary pattern with much room for improvement. 41-50 Dietary pattern unlikely to meet recommendations for good health and room for improvement. 51-60 More healthful dietary pattern, with some room for improvement.  >60 Healthy dietary pattern, although there may be some specific behaviors that could be improved.    Nutrition Goals Re-Evaluation:   Nutrition Goals Discharge (Final Nutrition Goals Re-Evaluation):   Psychosocial: Target Goals: Acknowledge presence or absence of significant depression and/or stress, maximize coping skills, provide positive support system. Participant is able to verbalize types and ability to use techniques and skills needed for reducing stress and depression.   Education: Stress, Anxiety, and Depression - Group verbal and visual presentation to define topics covered.  Reviews how body is impacted by stress, anxiety, and depression.  Also discusses healthy ways to reduce stress and to treat/manage anxiety and depression.  Written material given at graduation.   Education: Sleep Hygiene -Provides group verbal and written instruction about how sleep can affect your health.  Define sleep hygiene, discuss sleep cycles and impact of sleep habits. Review good sleep hygiene tips.    Initial Review & Psychosocial Screening:   Quality of Life  Scores:   Scores of 19 and below usually indicate a poorer quality of life in these areas.  A difference of  2-3 points is a clinically meaningful difference.  A difference of 2-3 points in the total score of the Quality of Life Index has been associated with significant improvement in overall quality of life, self-image, physical symptoms, and general health in studies assessing change in quality of life.  PHQ-9: Review Flowsheet  More data exists      01/06/2023 10/24/2022 01/03/2022 07/18/2021 12/28/2020  Depression screen PHQ 2/9  Decreased Interest 0 0 0 2 0  Down, Depressed, Hopeless 0 1 0 1 0  PHQ - 2 Score 0 1 0 3 0  Altered sleeping 0 0 - 3 -  Tired, decreased energy 0 1 - 3 -  Change in appetite 0 1 - 3 -  Feeling bad or failure about yourself  0 0 - 0 -  Trouble concentrating 0 1 - 0 -  Moving slowly or fidgety/restless 0 0 - 0 -  Suicidal thoughts 0 0 - 0 -  PHQ-9 Score 0 4 - 12 -  Difficult doing work/chores Not  difficult at all Somewhat difficult - Not difficult at all -   Interpretation of Total Score  Total Score Depression Severity:  1-4 = Minimal depression, 5-9 = Mild depression, 10-14 = Moderate depression, 15-19 = Moderately severe depression, 20-27 = Severe depression   Psychosocial Evaluation and Intervention:   Psychosocial Re-Evaluation:   Psychosocial Discharge (Final Psychosocial Re-Evaluation):   Vocational Rehabilitation: Provide vocational rehab assistance to qualifying candidates.   Vocational Rehab Evaluation & Intervention:   Education: Education Goals: Education classes will be provided on a variety of topics geared toward better understanding of heart health and risk factor modification. Participant will state understanding/return demonstration of topics presented as noted by education test scores.  Learning Barriers/Preferences:   General Cardiac Education Topics:  AED/CPR: - Group verbal and written instruction with the use of models  to demonstrate the basic use of the AED with the basic ABC's of resuscitation.   Anatomy and Cardiac Procedures: - Group verbal and visual presentation and models provide information about basic cardiac anatomy and function. Reviews the testing methods done to diagnose heart disease and the outcomes of the test results. Describes the treatment choices: Medical Management, Angioplasty, or Coronary Bypass Surgery for treating various heart conditions including Myocardial Infarction, Angina, Valve Disease, and Cardiac Arrhythmias.  Written material given at graduation. Flowsheet Row Cardiac Rehab from 10/24/2022 in [facility] Cardiac and Pulmonary Rehab  Education need identified 10/24/22       Medication Safety: - Group verbal and visual instruction to review commonly prescribed medications for heart and lung disease. Reviews the medication, class of the drug, and side effects. Includes the steps to properly store meds and maintain the prescription regimen.  Written material given at graduation.   Intimacy: - Group verbal instruction through game format to discuss how heart and lung disease can affect sexual intimacy. Written material given at graduation..   Know Your Numbers and Heart Failure: - Group verbal and visual instruction to discuss disease risk factors for cardiac and pulmonary disease and treatment options.  Reviews associated critical values for Overweight/Obesity, Hypertension, Cholesterol, and Diabetes.  Discusses basics of heart failure: signs/symptoms and treatments.  Introduces Heart Failure Zone chart for action plan for heart failure.  Written material given at graduation.   Infection Prevention: - Provides verbal and written material to individual with discussion of infection control including proper hand washing and proper equipment cleaning during exercise session. Flowsheet Row Cardiac Rehab from 10/24/2022 in [facility] Cardiac and Pulmonary Rehab  Education need identified  10/24/22  Date 10/24/22  Educator [place]  Instruction Review Code 1- Verbalizes Understanding       Falls Prevention: - Provides verbal and written material to individual with discussion of falls prevention and safety. Flowsheet Row Cardiac Rehab from 10/16/2022 in [facility] Cardiac and Pulmonary Rehab  Date 10/16/22  Educator SB  Instruction Review Code 1- Verbalizes Understanding       Other: -Provides group and verbal instruction on various topics (see comments)   Knowledge Questionnaire Score:   Core Components/Risk Factors/Patient Goals at Admission:  Personal Goals and Risk Factors at Admission - 01/13/23 1653       Core Components/Risk Factors/Patient Goals on Admission    Weight Management Yes;Weight Loss;Obesity    Intervention Weight Management: Develop a combined nutrition and exercise program designed to reach desired caloric intake, while maintaining appropriate intake of nutrient and fiber, sodium and fats, and appropriate energy expenditure required for the weight goal.;Weight Management/Obesity: Establish reasonable short term and long term weight  goals.;Weight Management: Provide education and appropriate resources to help participant work on and attain dietary goals.    Admit Weight 224 lb 8 oz (101.8 kg)    Goal Weight: Long Term 200 lb (90.7 kg)    Expected Outcomes Short Term: Continue to assess and modify interventions until short term weight is achieved;Long Term: Adherence to nutrition and physical activity/exercise program aimed toward attainment of established weight goal;Weight Loss: Understanding of general recommendations for a balanced deficit meal plan, which promotes 1-2 lb weight loss per week and includes a negative energy balance of [phone] kcal/d;Understanding recommendations for meals to include 15-35% energy as protein, 25-35% energy from fat, 35-60% energy from carbohydrates, less than 200mg  of dietary cholesterol, 20-35 gm of total fiber  daily;Understanding of distribution of calorie intake throughout the day with the consumption of 4-5 meals/snacks    Diabetes Yes    Intervention Provide education about signs/symptoms and action to take for hypo/hyperglycemia.;Provide education about proper nutrition, including hydration, and aerobic/resistive exercise prescription along with prescribed medications to achieve blood glucose in normal ranges: Fasting glucose 65-99 mg/dL    Expected Outcomes Short Term: Participant verbalizes understanding of the signs/symptoms and immediate care of hyper/hypoglycemia, proper foot care and importance of medication, aerobic/resistive exercise and nutrition plan for blood glucose control.;Long Term: Attainment of HbA1C < 7%.    Heart Failure Yes    Intervention Provide a combined exercise and nutrition program that is supplemented with education, support and counseling about heart failure. Directed toward relieving symptoms such as shortness of breath, decreased exercise tolerance, and extremity edema.    Expected Outcomes Improve functional capacity of life;Short term: Attendance in program 2-3 days a week with increased exercise capacity. Reported lower sodium intake. Reported increased fruit and vegetable intake. Reports medication compliance.;Short term: Daily weights obtained and reported for increase. Utilizing diuretic protocols set by physician.;Long term: Adoption of self-care skills and reduction of barriers for early signs and symptoms recognition and intervention leading to self-care maintenance.    Hypertension Yes    Intervention Provide education on lifestyle modifcations including regular physical activity/exercise, weight management, moderate sodium restriction and increased consumption of fresh fruit, vegetables, and low fat dairy, alcohol moderation, and smoking cessation.;Monitor prescription use compliance.    Expected Outcomes Short Term: Continued assessment and intervention until BP is <  140/73mm HG in hypertensive participants. < 130/65mm HG in hypertensive participants with diabetes, heart failure or chronic kidney disease.;Long Term: Maintenance of blood pressure at goal levels.    Lipids Yes    Intervention Provide education and support for participant on nutrition & aerobic/resistive exercise along with prescribed medications to achieve LDL 70mg , HDL >40mg .    Expected Outcomes Short Term: Participant states understanding of desired cholesterol values and is compliant with medications prescribed. Participant is following exercise prescription and nutrition guidelines.;Long Term: Cholesterol controlled with medications as prescribed, with individualized exercise RX and with personalized nutrition plan. Value goals: LDL < 70mg , HDL > 40 mg.             Education:Diabetes - Individual verbal and written instruction to review signs/symptoms of diabetes, desired ranges of glucose level fasting, after meals and with exercise. Acknowledge that pre and post exercise glucose checks will be done for 3 sessions at entry of program. [place] from 10/24/2022 in [facility] Cardiac and Pulmonary Rehab  Education need identified 10/24/22  Date 10/24/22  Educator [place]  Instruction Review Code 1- Verbalizes Understanding       Core Components/Risk  Factors/Patient Goals Review:    Core Components/Risk Factors/Patient Goals at Discharge (Final Review):    ITP Comments:  ITP Comments     Row Name 01/01/23 0911 01/06/23 1404 01/13/23 1638 01/29/23 1222 02/12/23 1201   ITP Comments 30 Day review completed. Medical Director ITP review done, changes made as directed, and signed approval by Medical Director.   still no visits since 11/22 Sent patient letter and did not hear back from him.  We will discharge him at this time. Discharge ITP sent to Medical Director. Patient returned to cardiac rehab after insurance change.  6 minute walk test performed and updated ITP created.  30 day review completed. ITP sent to Dr. Emily Filbert, Medical Director of Cardiac Rehab. Continue with ITP unless changes are made by physician. Spoke with patient- states he has been trying to recover from his fall- caused by tripping. Hasn't been to rehab since 2/21. Feels sore but was told he did not break anything. Sees his doctor tomorrow for a follow up and will ask if he has any limitations. Due to come back to rehab on 3/11 unless told otherwise.    [place] Name 02/26/23 0808           ITP Comments 30 Day review completed. Medical Director ITP review done, changes made as directed, and signed approval by Medical Director.    returned                Comments:

## 2023-03-03 ENCOUNTER — Ambulatory Visit: Payer: Medicare HMO

## 2023-03-03 ENCOUNTER — Encounter: Payer: No Typology Code available for payment source | Admitting: *Deleted

## 2023-03-03 DIAGNOSIS — Z952 Presence of prosthetic heart valve: Secondary | ICD-10-CM

## 2023-03-03 NOTE — Progress Notes (Signed)
Daily Session Note  Patient Details  Name: Sean Hayden MRN: [id number] Date of Birth: 1946/02/04 Referring Provider:   Flowsheet Row Cardiac Rehab from 01/13/2023 in [facility] Cardiac and Pulmonary Rehab  Referring Provider Lelon Huh MD       Encounter Date: 03/03/2023  Check In:  Session Check In - 03/03/23 1646       Check-In   Supervising physician immediately available to respond to emergencies See telemetry face sheet for immediately available ER MD    Location [facility]-Cardiac & Pulmonary Rehab    Staff Present Darlyne Russian, RN, ADN;Joseph Tessie Fass, RCP,RRT,BSRT;Noah Tickle, BS, Exercise Physiologist    Virtual Visit No    Medication changes reported     No    Fall or balance concerns reported    No    Warm-up and Cool-down Performed on first and last piece of equipment    Resistance Training Performed Yes    VAD Patient? No    PAD/SET Patient? No      Pain Assessment   Currently in Pain? No/denies                Social History   Tobacco Use  Smoking Status Former   Types: Cigars   Quit date: 04/08/2012   Years since quitting: 10.9  Smokeless Tobacco Never  Tobacco Comments   cigarettes and cigars  all quit 12 years ago    Goals Met:  Independence with exercise equipment Exercise tolerated well No report of concerns or symptoms today Strength training completed today  Goals Unmet:  Not Applicable  Comments: Pt able to follow exercise prescription today without complaint.  Will continue to monitor for progression.    Dr. Emily Filbert is Medical Director for [place].  Dr. Ottie Glazier is Medical Director for [facility] Pulmonary Rehabilitation.

## 2023-03-05 ENCOUNTER — Ambulatory Visit: Payer: Medicare HMO

## 2023-03-10 ENCOUNTER — Ambulatory Visit: Payer: Medicare HMO

## 2023-03-10 ENCOUNTER — Encounter: Payer: No Typology Code available for payment source | Attending: General Practice

## 2023-03-10 DIAGNOSIS — Z48812 Encounter for surgical aftercare following surgery on the circulatory system: Secondary | ICD-10-CM | POA: Insufficient documentation

## 2023-03-10 DIAGNOSIS — Z952 Presence of prosthetic heart valve: Secondary | ICD-10-CM | POA: Insufficient documentation

## 2023-03-12 ENCOUNTER — Encounter: Payer: No Typology Code available for payment source | Admitting: *Deleted

## 2023-03-12 ENCOUNTER — Ambulatory Visit: Payer: Medicare HMO

## 2023-03-12 DIAGNOSIS — Z952 Presence of prosthetic heart valve: Secondary | ICD-10-CM

## 2023-03-12 DIAGNOSIS — D3702 Neoplasm of uncertain behavior of tongue: Secondary | ICD-10-CM | POA: Diagnosis not present

## 2023-03-12 DIAGNOSIS — Z48812 Encounter for surgical aftercare following surgery on the circulatory system: Secondary | ICD-10-CM | POA: Diagnosis not present

## 2023-03-12 NOTE — Progress Notes (Signed)
Daily Session Note  Patient Details  Name: Sean Hayden MRN: [id number] Date of Birth: 09/09/1946 Referring Provider:   Flowsheet Row Cardiac Rehab from 01/13/2023 in [facility] Cardiac and Pulmonary Rehab  Referring Provider Lelon Huh MD       Encounter Date: 03/12/2023  Check In:  Session Check In - 03/12/23 1606       Check-In   Supervising physician immediately available to respond to emergencies See telemetry face sheet for immediately available ER MD    Location [facility]-Cardiac & Pulmonary Rehab    Staff Present Renita Papa, RN BSN;Joseph [place], RCP,RRT,BSRT;Laureen Nuangola, [place], RRT, CPFT    Virtual Visit No    Medication changes reported     No    Fall or balance concerns reported    No    Warm-up and Cool-down Performed on first and last piece of equipment    Resistance Training Performed Yes    VAD Patient? No    PAD/SET Patient? No      Pain Assessment   Currently in Pain? No/denies                Social History   Tobacco Use  Smoking Status Former   Types: Cigars   Quit date: 04/08/2012   Years since quitting: 10.9  Smokeless Tobacco Never  Tobacco Comments   cigarettes and cigars  all quit 12 years ago    Goals Met:  Independence with exercise equipment Exercise tolerated well No report of concerns or symptoms today Strength training completed today  Goals Unmet:  Not Applicable  Comments: Pt able to follow exercise prescription today without complaint.  Will continue to monitor for progression.    Dr. Emily Filbert is Medical Director for [place].  Dr. Ottie Glazier is Medical Director for [facility] Pulmonary Rehabilitation.

## 2023-03-14 ENCOUNTER — Encounter
Admission: RE | Admit: 2023-03-14 | Discharge: 2023-03-14 | Disposition: A | Discharge status: Home / Self Care | Payer: No Typology Code available for payment source | Source: Ambulatory Visit | Attending: Otolaryngology | Admitting: Otolaryngology

## 2023-03-14 ENCOUNTER — Other Ambulatory Visit: Payer: Self-pay

## 2023-03-14 DIAGNOSIS — Z01812 Encounter for preprocedural laboratory examination: Secondary | ICD-10-CM

## 2023-03-14 HISTORY — DX: Acute myocardial infarction, unspecified: I21.9

## 2023-03-14 HISTORY — DX: Atherosclerotic heart disease of native coronary artery without angina pectoris: I25.10

## 2023-03-14 HISTORY — DX: Myoneural disorder, unspecified: G70.9

## 2023-03-14 HISTORY — DX: Cardiac arrhythmia, unspecified: I49.9

## 2023-03-14 HISTORY — DX: Cardiac murmur, unspecified: R01.1

## 2023-03-14 HISTORY — DX: Unspecified asthma, uncomplicated: J45.909

## 2023-03-14 NOTE — Patient Instructions (Addendum)
Your procedure is scheduled on: 03/21/23 - Friday Report to the Registration Desk on the 1st floor of the [facility]. To find out your arrival time, please call [phone] between 1PM - 3PM on: 03/20/23 - Thursday If your arrival time is 6:00 am, do not arrive before that time as the [facility] entrance doors do not open until 6:00 am.  REMEMBER: Instructions that are not followed completely may result in serious medical risk, up to and including death; or upon the discretion of your surgeon and anesthesiologist your surgery may need to be rescheduled.  Do not eat food or drink any liquids after midnight the night before surgery.  No gum chewing or hard candies.  One week prior to surgery: Stop Anti-inflammatories (NSAIDS) such as Advil, Aleve, Ibuprofen, Motrin, Naproxen, Naprosyn and Aspirin based products such as Excedrin, Goody's Powder, BC Powder.  Stop ANY OVER THE COUNTER supplements until after surgery.  You may however, continue to take Tylenol if needed for pain up until the day of surgery.  Continue taking all prescribed medications with the exception of the following:   empagliflozin (JARDIANCE) hold 3 days prior to your procedure beginning 03/18/23. insulin detemir (LEVEMIR) inject 1/2 of bedtime dose on the night before your surgery and none on the morning of surgery. metFORMIN (GLUCOPHAGE) hold beginning 03/19/23. rivaroxaban (XARELTO) stop taking beginning 03/18/23, may resume taking on the day after your surgery.   TAKE ONLY THESE MEDICATIONS THE MORNING OF SURGERY WITH A SIP OF WATER:  fluticasone (FLONASE)  gabapentin (NEURONTIN)  metoprolol tartrate (LOPRESSOR)  PARoxetine (PAXIL)  levETIRAcetam (KEPPRA)     Use albuterol (VENTOLIN HFA)  on the day of surgery and bring to the hospital.   No Alcohol for 24 hours before or after surgery.  No Smoking including e-cigarettes for 24 hours before surgery.  No chewable tobacco products for at least 6  hours before surgery.  No nicotine patches on the day of surgery.  Do not use any "recreational" drugs for at least a week (preferably 2 weeks) before your surgery.  Please be advised that the combination of cocaine and anesthesia may have negative outcomes, up to and including death. If you test positive for cocaine, your surgery will be cancelled.  On the morning of surgery brush your teeth with toothpaste and water, you may rinse your mouth with mouthwash if you wish. Do not swallow any toothpaste or mouthwash.  Do not wear jewelry, make-up, hairpins, clips or nail polish.  Do not wear lotions, powders, or perfumes.   Do not shave body hair from the neck down 48 hours before surgery.  Contact lenses, hearing aids and dentures may not be worn into surgery.  Do not bring valuables to the hospital. [facility] is not responsible for any missing/lost belongings or valuables.   Notify your doctor if there is any change in your medical condition (cold, fever, infection).  Wear comfortable clothing (specific to your surgery type) to the hospital.  After surgery, you can help prevent lung complications by doing breathing exercises.  Take deep breaths and cough every 1-2 hours. Your doctor may order a device called an Incentive Spirometer to help you take deep breaths. When coughing or sneezing, hold a pillow firmly against your incision with both hands. This is called "splinting." Doing this helps protect your incision. It also decreases belly discomfort.  If you are being admitted to the hospital overnight, leave your suitcase in the car. After surgery it may be brought to  your room.  In case of increased patient census, it may be necessary for you, the patient, to continue your postoperative care in the Same Day Surgery department.  If you are being discharged the day of surgery, you will not be allowed to drive home. You will need a responsible individual to drive you home and stay with  you for 24 hours after surgery.   If you are taking public transportation, you will need to have a responsible individual with you.  Please call the Pre-admissions Testing Dept. at [phone] if you have any questions about these instructions.  Surgery Visitation Policy:  Patients having surgery or a procedure may have two visitors.  Children under the age of 35 must have an adult with them who is not the patient.  Inpatient Visitation:    Visiting hours are 7 a.m. to 8 p.m. Up to four visitors are allowed at one time in a patient room. The visitors may rotate out with other people during the day.  One visitor age 39 or older may stay with the patient overnight and must be in the room by 8 p.m.

## 2023-03-17 ENCOUNTER — Encounter: Payer: No Typology Code available for payment source | Admitting: *Deleted

## 2023-03-17 ENCOUNTER — Encounter
Admission: RE | Admit: 2023-03-17 | Discharge: 2023-03-17 | Disposition: A | Discharge status: Home / Self Care | Payer: Medicare HMO | Source: Ambulatory Visit | Attending: Otolaryngology | Admitting: Otolaryngology

## 2023-03-17 ENCOUNTER — Ambulatory Visit: Payer: Medicare HMO

## 2023-03-17 DIAGNOSIS — Z01818 Encounter for other preprocedural examination: Secondary | ICD-10-CM | POA: Insufficient documentation

## 2023-03-17 DIAGNOSIS — Z952 Presence of prosthetic heart valve: Secondary | ICD-10-CM | POA: Diagnosis not present

## 2023-03-17 DIAGNOSIS — Z01812 Encounter for preprocedural laboratory examination: Secondary | ICD-10-CM

## 2023-03-17 LAB — COMPREHENSIVE METABOLIC PANEL
ALT: 15 U/L (ref 0–44)
AST: 20 U/L (ref 15–41)
Albumin: 3.6 g/dL (ref 3.5–5.0)
Alkaline Phosphatase: 77 U/L (ref 38–126)
Anion gap: 7 (ref 5–15)
BUN: 23 mg/dL (ref 8–23)
CO2: 30 mmol/L (ref 22–32)
Calcium: 8.7 mg/dL — ABNORMAL LOW (ref 8.9–10.3)
Chloride: 101 mmol/L (ref 98–111)
Creatinine, Ser: 0.81 mg/dL (ref 0.61–1.24)
GFR, Estimated: >60 mL/min (ref >60)
Glucose, Bld: 181 mg/dL — ABNORMAL HIGH (ref 70–99)
Potassium: 3.9 mmol/L (ref 3.5–5.1)
Sodium: 138 mmol/L (ref 135–145)
Total Bilirubin: 0.8 mg/dL (ref 0.3–1.2)
Total Protein: 6.7 g/dL (ref 6.5–8.1)

## 2023-03-17 NOTE — Progress Notes (Signed)
Daily Session Note  Patient Details  Name: Sean Hayden MRN: [id number] Date of Birth: 1946/01/14 Referring Provider:   Flowsheet Row Cardiac Rehab from 01/13/2023 in [facility] Cardiac and Pulmonary Rehab  Referring Provider Mila Merry MD       Encounter Date: 03/17/2023  Check In:  Session Check In - 03/17/23 1604       Check-In   Supervising physician immediately available to respond to emergencies See telemetry face sheet for immediately available ER MD    Location [facility]-Cardiac & Pulmonary Rehab    Staff Present Susann Givens, RN BSN;Joseph Reino [place], RCP,RRT,BSRT;Noah [place], [place], Exercise Physiologist    Virtual Visit No    Medication changes reported     No    Fall or balance concerns reported    No    Warm-up and Cool-down Performed on first and last piece of equipment    Resistance Training Performed Yes    VAD Patient? No    PAD/SET Patient? No      Pain Assessment   Currently in Pain? No/denies                Social History   Tobacco Use  Smoking Status Former   Types: Cigars   Quit date: 04/08/2012   Years since quitting: 10.9  Smokeless Tobacco Never  Tobacco Comments   cigarettes and cigars  all quit 12 years ago    Goals Met:  Independence with exercise equipment Exercise tolerated well No report of concerns or symptoms today Strength training completed today  Goals Unmet:  Not Applicable  Comments: Pt able to follow exercise prescription today without complaint.  Will continue to monitor for progression.    Dr. Bethann Punches is Medical Director for [facility] Cardiac Rehabilitation.  Dr. Vida Rigger is Medical Director for [facility] Pulmonary Rehabilitation.

## 2023-03-18 ENCOUNTER — Encounter: Payer: Self-pay | Admitting: Urgent Care

## 2023-03-18 NOTE — Progress Notes (Signed)
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 Clinical nursing staff has updated patient's PMH/PSHx, current medication list, and drug allergies/intolerances to ensure comprehensive history available to assist in medical decision making as it pertains to the aforementioned surgical procedure and anticipated anesthetic course. Extensive review of available clinical information personally performed. [place] [facility] and PSHx updated with any diagnoses/procedures that  may have been inadvertently omitted during his intake with the pre-admission testing department's nursing staff.  Clinical Discussion:  Sean Hayden is a 77 y.o. male who is submitted for pre-surgical anesthesia review and clearance prior to him undergoing the above procedure. Patient is a Former Smoker (quit 04/2012). Pertinent PMH includes: CAD, severe aortic stenosis (s/p TAVR), NSVT, nonischemic congestive cardiomyopathy, HFrEF, PAF, ischemic CVA, BILATERAL carotid artery stenosis (s/p RIGHT CEA), cardiac murmur, HTN, HLD, T2DM, asthma, nephrolithiasis, glossal neoplasm, insomnia.  Patient is followed by cardiology Donnamarie Rossetti, MD). He was last seen in the cardiology clinic on 02/13/2023; notes reviewed. At the time of his clinic visit, patient doing well overall from a cardiovascular perspective. Patient denied any chest pain, shortness of breath, PND, orthopnea, palpitations, significant peripheral  edema, weakness, fatigue, vertiginous symptoms, or presyncope/syncope. Patient with a past medical history significant for cardiovascular diagnoses. Documented physical exam was grossly benign, providing no evidence of acute exacerbation and/or decompensation of the patient's known cardiovascular conditions.  Patient with significant BILATERAL carotid artery stenosis.  Carotid Doppler performed on 03/10/2012 revealed 75-95% stenosis and the RICA with a <50% contralateral stenosis in the LICA/patient underwent RIGHT carotid endarterectomy on 04/17/2012 with CorMatrix patch angioplasty.  Patient suffered an ischemic CVA on 02/21/2016.  He presented to the ED with complaints of LEFT-sided weakness.  MRI imaging of the brain revealed an acute infarct in the RIGHT medial parietal lobe measuring 5 mm.  Patient fully recovered with no residual deficits following neurovascular event.  Patient with a history of aortic valve stenosis.  TTE performed on 02/22/2016 revealed moderate to severe stenotic valvulopathy; mean pressure gradient 17 mmHg.  Patient ultimately underwent TAVR via a transfemoral approach on 09/09/2022 placing a 26 mm Edwards SAPIEN 3 Ultra Resilia prosthetic valve.  Patient underwent loop recorder implantation on 02/23/2016.   Most recent TTE was performed on 09/10/2022 revealing a moderately reduced left ventricular systolic function with an EF of 40%.  There was global hypokinesis and septal akinesis.  Left ventricular GLS -11.5%.  There was mild biatrial enlargement.  There was mild tricuspid and moderate mitral valve regurgitation.  Bioprosthetic aortic valve noted to be well-positioned and functioning properly with a mean pressure gradient of 3 mmHg; AVA (VTI) = 1.7 cm.  Long-term cardiac event monitor study performed on 09/10/2022 revealing a predominant underlying atrial fibrillation at a 100% study burden.  Average heart rate was 96 bpm; range 44-197 bpm. There were 6 runs of NSVT with  the longest lasting 23 beats, and the fastest episode of reaching a heart rate of 164 bpm.  Ventricular ectopy noted with  a 2% study burden (45,775 beats). No patient triggered events noted.  Blood pressure well controlled at 103/61 mmHg on currently prescribed diuretic (furosemide), ACEi (lisinopril), and beta-blocker (metoprolol tartrate) therapies.  Patient is on atorvastatin for his HLD diagnosis and ASCVD prevention. T2DM reasonably controlled on currently prescribed regimen; last HgbA1c was 7.5 when checked on 08/25/2022.  In the setting of cardiovascular disease with concurrent T2DM, patient is on an SGLT2i (empagliflozin) for cardiovascular and renovascular protection.  He does not have an OSAH diagnosis.  From a functional standpoint, patient doing much better following cardiac rehab.  Breathing and overall stamina has improved.  Patient with recent fall resulting in significant RIGHT supraorbital swelling and periorbital bruising.  With that being said, patient felt to be able to achieve at least 4 METS of physical activity without experiencing any significant degree of angina/anginal equivalent symptoms. No changes were made to his medication regimen during his visit with cardiology.  Patient scheduled to follow-up with outpatient cardiology in 4 months or sooner if needed.  Sean Hayden is scheduled for a PARTIAL GLOSSECTOMY (Left) on 03/21/2023 with Dr. Vernie Murders, MD.  Given patient's past medical history significant for cardiovascular diagnoses, presurgical cardiac clearance was sought by the PAT team. Per cardiology, "patient is medically optimized for surgery.  He is at LOW risk for a cardiovascular event.  Call with any further questions".  Again, this patient is on daily anticoagulation therapy using rivaroxaban.  He has been instructed on recommendations from his cardiology providers for holding his rivaroxaban dose for 3 days prior to his procedure with plans to restart as soon as  postoperative bleeding risk felt to be minimized by his primary attending surgeon.  The patient is aware that his last dose of rivaroxaban should be on 03/17/2023.  Patient denies previous perioperative complications with anesthesia in the past. In review of the available records, it is noted that patient underwent a general anesthetic course at [facility] (ASA IV) in 09/2022 without documented complications.      01/29/2023   11:12 PM 01/29/2023    9:30 PM 01/29/2023    9:15 PM  Vitals with BMI  Systolic 124 117 161  Diastolic 68 71 74  Pulse 99 53 78    Providers/Specialists:   NOTE: Primary physician provider listed below. Patient may have been seen by APP or partner within same practice.   PROVIDER ROLE / SPECIALTY LAST Alinda Deem, MD Otolaryngology (Surgeon) 03/12/2023  Malva Limes, MD Primary Care Provider 09/18/2022  Keitha Butte, MD Cardiology 02/13/2023   Allergies:  Pioglitazone  Current Home Medications:   No current facility-administered medications for this encounter.    acyclovir (ZOVIRAX) 400 MG tablet   albuterol (VENTOLIN HFA) 108 (90 Base) MCG/ACT inhaler   atorvastatin (LIPITOR) 20 MG tablet   benzocaine (ORAJEL) 10 % mucosal gel   Cholecalciferol (VITAMIN D PO)   Docosanol 10 % CREA   empagliflozin (JARDIANCE) 25 MG TABS tablet   Flaxseed, Linseed, (FLAXSEED OIL) 1000 MG CAPS   fluticasone (FLONASE) 50 MCG/ACT nasal spray   furosemide (LASIX) 20 MG tablet   gabapentin (NEURONTIN) 100 MG capsule   HYDROcodone-acetaminophen (NORCO/VICODIN) 5-325 MG tablet   insulin detemir (LEVEMIR) 100 UNIT/ML injection   levETIRAcetam (KEPPRA) 750 MG tablet   lisinopril (ZESTRIL) 2.5 MG tablet   loratadine (CLARITIN) 10 MG tablet   metFORMIN (GLUCOPHAGE) 850 MG tablet   metoprolol tartrate (LOPRESSOR) 25 MG tablet   Misc Natural Products (OSTEO BI-FLEX  TRIPLE STRENGTH PO)   MISC NATURAL PRODUCTS PO   Omega-3 Fatty Acids (FISH OIL)  1000 MG CPDR   PARoxetine (PAXIL) 40 MG tablet   rivaroxaban (XARELTO) 20 MG TABS tablet   History:   Past Medical History:  Diagnosis Date   Aortic stenosis    a.) TTE 02/22/2016: moderate - severe AS (MPG 17); b.) s/p TAVR 09/09/2022: 26 mm Edwards Sapien 3 Ultra Resilia   Asthma    B12 deficiency    Carotid stenosis, bilateral    a.) carotid doppler 03/10/2012: 75-95% RICA, < 50 LICA; b.) s/p RIGHT CEA 04/17/2012 with CorMatrix patch angioplasty   Coronary artery disease    Gout    Heart murmur    Hemorrhoids    HFrEF (heart failure with reduced ejection fraction)    a.) TTE 02/22/2016: EF 60-65%, mild LV dil, mod RA dil, mod-sev AS, G1DD; b.) TTE 09/10/2022: EF 40%, glob HK, sep AK, mild BAE, mild TR, mod MR, pros AoV (MPG 3)   History of kidney stones    Hyperlipidemia    Hyperplastic colon polyp    Hypertension    Insomnia    Ischemic cerebrovascular accident (CVA) 02/21/2016   a.) presented with LEFT sided weakness; b.) MRI brain 02/21/2016: acute infarct in the right medial parietal lobe measuring 5 mm   Long term current use of anticoagulant    a.) rivaroxaban   Neuropathy    Nonischemic congestive cardiomyopathy    a.) TTE 02/22/2016: EF 60-65%; b.) TTE 09/10/2022: EF 40%   Nose colonized with MRSA 02/21/2016   a.) surgical PCR (+) 02/21/2016 prior to ILR implant   NSVT (nonsustained ventricular tachycardia)    a) holter 09/10/2022: longest episode 23 beats, and fastest episode 164 bpm   PAF (paroxysmal atrial fibrillation)    a.) CHA2DS2VASc = 8 (age x2, HFrEF, HTN, CVAx2, vascular disease history, T2DM);  b.) s/p ILR implant 02/23/2016; c.) rate/rhythm maintained on oral metoprolol tartrate; chronically anticoagulated with rivaroxaban   Pneumonia    S/P TAVR (transcatheter aortic valve replacement) 09/09/2022   a.) 26 mm Edwards Sapien 3 Ultra Resilia   Seizures    Skin cancer of face    T2DM (type 2 diabetes mellitus)    Tongue neoplasm    Past Surgical  History:  Procedure Laterality Date   Arm fracture Left    CAROTID ARTERY ANGIOPLASTY Right 04/17/2012   Procedure: CAROTID ARTERY ANGIOPLASTY; Location: [facility]; Surgeon: Levora DredgeGregory Schnier, MD   CAROTID ENDARTERECTOMY Right 04/17/2012   Procedure: CAROTID ENDARTARECTOMY; Location: [facility]; Surgeon: Levora DredgeGregory Schnier, MD   CHOLECYSTECTOMY, LAPAROSCOPIC  2005   COLONOSCOPY     CYST EXCISION     "between legs"   EP IMPLANTABLE DEVICE N/A 02/23/2016   Procedure: Loop Recorder Insertion;  Surgeon: Hillis RangeJames Allred, MD;  [facility]: [facility] INVASIVE CV LAB;  Service: Cardiovascular;  Laterality: N/A;   HEMORRHOID SURGERY  2009   INCISION AND DRAINAGE WOUND WITH NERVE AND ARTERY REPAIR Left 10/04/2017   Procedure: LEFT HAND WOUND EXPLORATION repair as necessary;  Surgeon: Bradly Bienenstockrtmann, Fred, MD;  Location: [facility] OR;  Service: Orthopedics;  Laterality: Left;   MOHS SURGERY Right 12/24/2022   - Katrine CohoBradley Merritt, MD   TEE WITHOUT CARDIOVERSION N/A 02/23/2016   Procedure: TRANSESOPHAGEAL ECHOCARDIOGRAM (TEE);  Surgeon: Chrystie NoseKenneth C Hilty, MD;  [facility];  Service: Cardiovascular;  Laterality: N/A;  ALSO GETTING A LOOP   TRANSCATHETER AORTIC VALVE REPLACEMENT, TRANSFEMORAL  09/09/2022   Procedure: TRANSCATHETER AORTIC VALVE REPLACEMENT, TRANSFEMORAL; Location: [facility]; Surgeon:  Nance Pew, MD   Family History  Problem Relation Age of Onset   Hodgkin's lymphoma Father    Diabetes Mother    Social History   Tobacco Use   Smoking status: Former    Types: Cigars    Quit date: 04/08/2012    Years since quitting: 10.9   Smokeless tobacco: Never   Tobacco comments:    cigarettes and cigars  all quit 12 years ago  Vaping Use   Vaping Use: Never used  Substance Use Topics   Alcohol use: No    Alcohol/week: 0.0 standard drinks of alcohol    Comment: quit 2013   Drug use: No    Pertinent Clinical Results:  LABS:   Hospital Outpatient Visit on 03/17/2023  Component Date Value Ref Range Status   Sodium  03/17/2023 138  135 - 145 mmol/L Final   Potassium 03/17/2023 3.9  3.5 - 5.1 mmol/L Final   Chloride 03/17/2023 101  98 - 111 mmol/L Final   CO2 03/17/2023 30  22 - 32 mmol/L Final   Glucose, Bld 03/17/2023 181 (H)  70 - 99 mg/dL Final   Glucose reference range applies only to samples taken after fasting for at least 8 hours.   BUN 03/17/2023 23  8 - 23 mg/dL Final   Creatinine, Ser 03/17/2023 0.81  0.61 - 1.24 mg/dL Final   Calcium 28/41/3244 8.7 (L)  8.9 - 10.3 mg/dL Final   Total Protein 12/11/7251 6.7  6.5 - 8.1 g/dL Final   Albumin 66/44/0347 3.6  3.5 - 5.0 g/dL Final   AST 42/59/5638 20  15 - 41 U/L Final   ALT 03/17/2023 15  0 - 44 U/L Final   Alkaline Phosphatase 03/17/2023 77  38 - 126 U/L Final   Total Bilirubin 03/17/2023 0.8  0.3 - 1.2 mg/dL Final   GFR, Estimated 03/17/2023 >60  >60 mL/min Final   Comment: (NOTE) Calculated using the CKD-EPI Creatinine Equation (2021)    Anion gap 03/17/2023 7  5 - 15 Final   Performed at [facility], [address] [place], [place] [address]    ECG: Date: 03/17/2023 Time ECG obtained: 0923 AM Rate: 77 bpm Rhythm: atrial fibrillation Axis (leads I and aVF): Right axis deviation Intervals: QRS 92 ms. QTc 454 ms. ST segment and T wave changes: Nonspecific anterior T wave abnormalities Comparison: Similar to previous tracing obtained on 10/01/2022   IMAGING / PROCEDURES: DIAGNOSTIC RADIOGRAPHS OF CHEST 2 VIEWS  performed on 02/01/2022 Improved aeration of the lungs compared to the prior examination.  Some minimal reticular opacities remain possibly minimal pulmonary edema.  Trace right pleural effusion with associated right basilar atelectasis.  No pneumothorax.  Cardiac and mediastinal contours slightly improved from the prior examination.  Unchanged positioning of stent mounted aortic valve endograft.  Subcutaneous defibrillator in place.   LONG TERM CARDIAC EVENT MONITOR STUDY performed on 09/10/2022 The  predominant rhythm was atrial fibrillation.  Maximum heart rate recorded was 197 bpm Minimum heart rate recorded was 44 bpm Average heart rate was 96 bpm. There were 45,775 VE beats with a burden of 2%.  There were 6 occurrences of ventricular tachycardia with the longest episode 23 beats, and the fastest episode 164 bpm The study included an atrial fibrillation burden of 100 %. The longest episode was 13d 13h 19m 56.0s, and the fastest episode was 197 bpm There were 0 Patient Triggers.   TRANSTHORACIC ECHOCARDIOGRAM performed on 09/10/2022 Mildly reduced left ventricular systolic function with an  EF of 40% Global hypokinesis Septal akinesis Average LV GLS -11.5% Mild biatrial enlargement Mild tricuspid valve regurgitation Moderate mitral valve regurgitation Prosthetic aortic valve (s/p TAVR) with a mean pressure gradient of 3 mmHg; AVA (VTI) = 1.72 cm  Impression and Plan:  Sean Hayden has been referred for pre-anesthesia review and clearance prior to him undergoing the planned anesthetic and procedural courses. Available labs, pertinent testing, and imaging results were personally reviewed by me in preparation for upcoming operative/procedural course. [facility] medical record has been updated following extensive record review and patient interview with PAT staff.   This patient has been appropriately cleared by cardiology with an overall LOW risk of significant perioperative cardiovascular complications. Given his recent TAVR, patient will need pre-procedural SBE prophylaxis. This has been discussed with surgeon who agrees; Dr. Elenore Rota to prescribe and contact patient.   Based on clinical review performed today (03/19/23), barring any significant acute changes in the patient's overall condition, it is anticipated that he will be able to proceed with the planned surgical intervention. Any acute changes in clinical condition may necessitate his procedure being postponed and/or  cancelled. Patient will meet with anesthesia team (MD and/or CRNA) on the day of his procedure for preoperative evaluation/assessment. Questions regarding anesthetic course will be fielded at that time.   Pre-surgical instructions were reviewed with the patient during his PAT appointment, and questions were fielded to satisfaction by PAT clinical staff. He has been instructed on which medications that he will need to hold prior to surgery, as well as the ones that have been deemed safe/appropriate to take of the day of his procedure. As part of the general education provided by PAT, patient made aware both verbally and in writing, that he would need to abstain from the use of any illegal substances during his perioperative course.  He was advised that failure to follow the provided instructions could necessitate case cancellation or result serious perioperative complications up to and including death. Patient encouraged to contact PAT and/or his surgeon's office to discuss any questions or concerns that may arise prior to surgery; verbalized understanding.   Quentin Mulling, MSN, APRN, FNP-C, CEN [facility]  Peri-operative Services Nurse Practitioner Phone: [phone] Fax: [phone] 03/19/23 7:51 AM  NOTE: This note has been prepared using Dragon dictation software. Despite my best ability to proofread, there is always the potential that unintentional transcriptional errors may still occur from this process.

## 2023-03-19 ENCOUNTER — Ambulatory Visit: Payer: Medicare HMO

## 2023-03-19 DIAGNOSIS — Z952 Presence of prosthetic heart valve: Secondary | ICD-10-CM | POA: Diagnosis not present

## 2023-03-19 NOTE — Progress Notes (Signed)
Daily Session Note  Patient Details  Name: Sean Hayden MRN: [id number] Date of Birth: January 04, 1946 Referring Provider:   Flowsheet Row Cardiac Rehab from 01/13/2023 in [facility] Cardiac and Pulmonary Rehab  Referring Provider Mila Merry MD       Encounter Date: 03/19/2023  Check In:  Session Check In - 03/19/23 1559       Check-In   Supervising physician immediately available to respond to emergencies See telemetry face sheet for immediately available ER MD    Location [facility]-Cardiac & Pulmonary Rehab    Staff Present Susann Givens, RN Mabeline Caras, BS, ACSM CEP, Exercise Physiologist;Megan Katrinka Blazing, RN, ADN    Virtual Visit No    Medication changes reported     No    Fall or balance concerns reported    No    Warm-up and Cool-down Performed on first and last piece of equipment    Resistance Training Performed Yes    VAD Patient? No    PAD/SET Patient? No      Pain Assessment   Currently in Pain? No/denies                Social History   Tobacco Use  Smoking Status Former   Types: Cigars   Quit date: 04/08/2012   Years since quitting: 10.9  Smokeless Tobacco Never  Tobacco Comments   cigarettes and cigars  all quit 12 years ago    Goals Met:  Independence with exercise equipment Exercise tolerated well No report of concerns or symptoms today Strength training completed today  Goals Unmet:  Not Applicable  Comments: Pt able to follow exercise prescription today without complaint.  Will continue to monitor for progression.'   Dr. Bethann Punches is Medical Director for [facility] Cardiac Rehabilitation.  Dr. Vida Rigger is Medical Director for [facility] Pulmonary Rehabilitation.

## 2023-03-20 ENCOUNTER — Ambulatory Visit: Admit: 2023-03-20 | Payer: No Typology Code available for payment source | Admitting: Otolaryngology

## 2023-03-20 SURGERY — GLOSSECTOMY
Anesthesia: General | Laterality: Left

## 2023-03-21 ENCOUNTER — Encounter
Admission: RE | Disposition: A | Discharge status: Home / Self Care | Payer: Self-pay | Source: Home / Self Care | Attending: Otolaryngology

## 2023-03-21 ENCOUNTER — Ambulatory Visit: Payer: Medicare HMO | Admitting: Urgent Care

## 2023-03-21 ENCOUNTER — Other Ambulatory Visit: Payer: Self-pay

## 2023-03-21 ENCOUNTER — Encounter: Payer: Self-pay | Admitting: Otolaryngology

## 2023-03-21 ENCOUNTER — Ambulatory Visit
Admission: RE | Admit: 2023-03-21 | Discharge: 2023-03-21 | Disposition: A | Discharge status: Home / Self Care | Payer: Medicare HMO | Attending: Otolaryngology | Admitting: Otolaryngology

## 2023-03-21 DIAGNOSIS — I480 Paroxysmal atrial fibrillation: Secondary | ICD-10-CM | POA: Insufficient documentation

## 2023-03-21 DIAGNOSIS — C023 Malignant neoplasm of anterior two-thirds of tongue, part unspecified: Secondary | ICD-10-CM | POA: Diagnosis not present

## 2023-03-21 DIAGNOSIS — I2510 Atherosclerotic heart disease of native coronary artery without angina pectoris: Secondary | ICD-10-CM | POA: Insufficient documentation

## 2023-03-21 DIAGNOSIS — E538 Deficiency of other specified B group vitamins: Secondary | ICD-10-CM | POA: Insufficient documentation

## 2023-03-21 DIAGNOSIS — E785 Hyperlipidemia, unspecified: Secondary | ICD-10-CM | POA: Insufficient documentation

## 2023-03-21 DIAGNOSIS — Z01812 Encounter for preprocedural laboratory examination: Secondary | ICD-10-CM

## 2023-03-21 DIAGNOSIS — Z794 Long term (current) use of insulin: Secondary | ICD-10-CM | POA: Insufficient documentation

## 2023-03-21 DIAGNOSIS — Z8614 Personal history of Methicillin resistant Staphylococcus aureus infection: Secondary | ICD-10-CM | POA: Insufficient documentation

## 2023-03-21 DIAGNOSIS — E119 Type 2 diabetes mellitus without complications: Secondary | ICD-10-CM | POA: Diagnosis not present

## 2023-03-21 DIAGNOSIS — Z952 Presence of prosthetic heart valve: Secondary | ICD-10-CM | POA: Diagnosis not present

## 2023-03-21 DIAGNOSIS — I5022 Chronic systolic (congestive) heart failure: Secondary | ICD-10-CM | POA: Diagnosis not present

## 2023-03-21 DIAGNOSIS — C022 Malignant neoplasm of ventral surface of tongue: Secondary | ICD-10-CM | POA: Diagnosis not present

## 2023-03-21 DIAGNOSIS — I110 Hypertensive heart disease with heart failure: Secondary | ICD-10-CM | POA: Insufficient documentation

## 2023-03-21 DIAGNOSIS — Z7901 Long term (current) use of anticoagulants: Secondary | ICD-10-CM | POA: Insufficient documentation

## 2023-03-21 DIAGNOSIS — D3702 Neoplasm of uncertain behavior of tongue: Secondary | ICD-10-CM | POA: Diagnosis not present

## 2023-03-21 DIAGNOSIS — Z7984 Long term (current) use of oral hypoglycemic drugs: Secondary | ICD-10-CM | POA: Insufficient documentation

## 2023-03-21 DIAGNOSIS — Z87891 Personal history of nicotine dependence: Secondary | ICD-10-CM | POA: Insufficient documentation

## 2023-03-21 DIAGNOSIS — K149 Disease of tongue, unspecified: Secondary | ICD-10-CM | POA: Diagnosis present

## 2023-03-21 HISTORY — DX: Occlusion and stenosis of bilateral carotid arteries: I65.23

## 2023-03-21 HISTORY — DX: Type 2 diabetes mellitus without complications: E11.9

## 2023-03-21 HISTORY — DX: Neoplasm of unspecified behavior of digestive system: D49.0

## 2023-03-21 HISTORY — DX: Unspecified systolic (congestive) heart failure: I50.20

## 2023-03-21 HISTORY — DX: Dilated cardiomyopathy: I42.0

## 2023-03-21 HISTORY — DX: Long term (current) use of anticoagulants: Z79.01

## 2023-03-21 HISTORY — DX: Unspecified malignant neoplasm of skin of unspecified part of face: C44.300

## 2023-03-21 HISTORY — DX: Other ventricular tachycardia: I47.29

## 2023-03-21 LAB — GLUCOSE, CAPILLARY
Glucose-Capillary: 231 mg/dL — ABNORMAL HIGH (ref 70–99)
Glucose-Capillary: 220 mg/dL — ABNORMAL HIGH (ref 70–99)

## 2023-03-21 SURGERY — GLOSSECTOMY, PARTIAL
Anesthesia: General | Laterality: Left

## 2023-03-21 MED ORDER — CLINDAMYCIN PHOSPHATE 600 MG/50ML IV SOLN
INTRAVENOUS | Status: AC
  Filled 2023-03-21: qty 50

## 2023-03-21 MED ORDER — FENTANYL CITRATE (PF) 100 MCG/2ML IJ SOLN
INTRAMUSCULAR | Status: AC
  Filled 2023-03-21: qty 2

## 2023-03-21 MED ORDER — ROCURONIUM BROMIDE 100 MG/10ML IV SOLN
INTRAVENOUS | Status: DC | PRN
  Administered 2023-03-21: 50 mg via INTRAVENOUS

## 2023-03-21 MED ORDER — FAMOTIDINE 20 MG PO TABS
ORAL_TABLET | ORAL | Status: AC
  Filled 2023-03-21: qty 1

## 2023-03-21 MED ORDER — ONDANSETRON HCL 4 MG/2ML IJ SOLN
INTRAMUSCULAR | Status: AC
  Filled 2023-03-21: qty 2

## 2023-03-21 MED ORDER — LIDOCAINE-EPINEPHRINE (PF) 1 %-1:200000 IJ SOLN
INTRAMUSCULAR | Status: DC | PRN
  Administered 2023-03-21: 5 mL

## 2023-03-21 MED ORDER — ORAL CARE MOUTH RINSE: 15.0000 mL | Freq: Once | OROMUCOSAL | Status: AC

## 2023-03-21 MED ORDER — PHENYLEPHRINE HCL (PRESSORS) 10 MG/ML IV SOLN
INTRAVENOUS | Status: DC | PRN
  Administered 2023-03-21 (×6): 160 ug via INTRAVENOUS

## 2023-03-21 MED ORDER — LIDOCAINE-EPINEPHRINE 1 %-1:100000 IJ SOLN
INTRAMUSCULAR | Status: AC
  Filled 2023-03-21: qty 1

## 2023-03-21 MED ORDER — IPRATROPIUM-ALBUTEROL 0.5-2.5 (3) MG/3ML IN SOLN
RESPIRATORY_TRACT | Status: AC
  Filled 2023-03-21: qty 3

## 2023-03-21 MED ORDER — FENTANYL CITRATE (PF) 100 MCG/2ML IJ SOLN
INTRAMUSCULAR | Status: DC | PRN
  Administered 2023-03-21: 50 ug via INTRAVENOUS
  Administered 2023-03-21: 25 ug via INTRAVENOUS

## 2023-03-21 MED ORDER — LIDOCAINE HCL (CARDIAC) PF 100 MG/5ML IV SOSY
PREFILLED_SYRINGE | INTRAVENOUS | Status: DC | PRN
  Administered 2023-03-21: 100 mg via INTRAVENOUS

## 2023-03-21 MED ORDER — PROPOFOL 10 MG/ML IV BOLUS
INTRAVENOUS | Status: DC | PRN
  Administered 2023-03-21: 100 mg via INTRAVENOUS

## 2023-03-21 MED ORDER — HYDROCODONE-ACETAMINOPHEN 5-325 MG PO TABS: 1.0000 | ORAL_TABLET | Freq: Four times a day (QID) | ORAL | 0 refills | Status: AC | PRN

## 2023-03-21 MED ORDER — ONDANSETRON HCL 4 MG/2ML IJ SOLN
4.0000 mg | Freq: Once | INTRAMUSCULAR | Status: AC | PRN
  Administered 2023-03-21: 4 mg via INTRAVENOUS

## 2023-03-21 MED ORDER — SODIUM CHLORIDE 0.9 % IV SOLN: INTRAVENOUS | Status: DC

## 2023-03-21 MED ORDER — CHLORHEXIDINE GLUCONATE 0.12 % MT SOLN
OROMUCOSAL | Status: AC
  Filled 2023-03-21: qty 15

## 2023-03-21 MED ORDER — ACETAMINOPHEN 10 MG/ML IV SOLN
INTRAVENOUS | Status: DC | PRN
  Administered 2023-03-21: 1000 mg via INTRAVENOUS

## 2023-03-21 MED ORDER — SUGAMMADEX SODIUM 200 MG/2ML IV SOLN
INTRAVENOUS | Status: DC | PRN
  Administered 2023-03-21: 200 mg via INTRAVENOUS

## 2023-03-21 MED ORDER — FENTANYL CITRATE (PF) 100 MCG/2ML IJ SOLN: 25.0000 ug | INTRAMUSCULAR | Status: DC | PRN

## 2023-03-21 MED ORDER — PHENYLEPHRINE HCL-NACL 20-0.9 MG/250ML-% IV SOLN
INTRAVENOUS | Status: AC
  Filled 2023-03-21: qty 250

## 2023-03-21 MED ORDER — IPRATROPIUM-ALBUTEROL 0.5-2.5 (3) MG/3ML IN SOLN
3.0000 mL | Freq: Once | RESPIRATORY_TRACT | Status: AC
  Administered 2023-03-21: 3 mL via RESPIRATORY_TRACT

## 2023-03-21 MED ORDER — CLINDAMYCIN PHOSPHATE 600 MG/50ML IV SOLN
600.0000 mg | Freq: Once | INTRAVENOUS | Status: AC
  Administered 2023-03-21: 600 mg via INTRAVENOUS

## 2023-03-21 MED ORDER — FAMOTIDINE 20 MG PO TABS
20.0000 mg | ORAL_TABLET | Freq: Once | ORAL | Status: AC
  Administered 2023-03-21: 20 mg via ORAL

## 2023-03-21 MED ORDER — ONDANSETRON HCL 4 MG/2ML IJ SOLN
INTRAMUSCULAR | Status: DC | PRN
  Administered 2023-03-21: 4 mg via INTRAVENOUS

## 2023-03-21 MED ORDER — ACETAMINOPHEN 10 MG/ML IV SOLN
INTRAVENOUS | Status: AC
  Filled 2023-03-21: qty 100

## 2023-03-21 MED ORDER — CHLORHEXIDINE GLUCONATE 0.12 % MT SOLN
15.0000 mL | Freq: Once | OROMUCOSAL | Status: AC
  Administered 2023-03-21: 15 mL via OROMUCOSAL

## 2023-03-21 MED ORDER — LACTATED RINGERS IV SOLN: INTRAVENOUS | Status: DC | PRN

## 2023-03-21 MED ORDER — PROPOFOL 10 MG/ML IV BOLUS
INTRAVENOUS | Status: AC
  Filled 2023-03-21: qty 60

## 2023-03-21 MED ORDER — DEXAMETHASONE SODIUM PHOSPHATE 10 MG/ML IJ SOLN
INTRAMUSCULAR | Status: DC | PRN
  Administered 2023-03-21: 10 mg via INTRAVENOUS

## 2023-03-21 MED ORDER — PROPOFOL 1000 MG/100ML IV EMUL
INTRAVENOUS | Status: AC
  Filled 2023-03-21: qty 100

## 2023-03-21 SURGICAL SUPPLY — 25 items
BLADE BOVIE TIP EXT 4 (BLADE) ×1 IMPLANT
BLADE SURG 15 STRL LF DISP TIS (BLADE) ×1 IMPLANT
BLADE SURG 15 STRL SS (BLADE) ×1
ELECT REM PT RETURN 9FT ADLT (ELECTROSURGICAL) ×1
ELECTRODE REM PT RTRN 9FT ADLT (ELECTROSURGICAL) ×1 IMPLANT
GAUZE 4X4 16PLY ~~LOC~~+RFID DBL (SPONGE) IMPLANT
GLOVE PROTEXIS LATEX SZ 7.5 (GLOVE) ×1 IMPLANT
GLOVE SURG LATEX 7.5 PF (GLOVE) ×1 IMPLANT
GOWN STRL REUS W/ TWL LRG LVL3 (GOWN DISPOSABLE) ×2 IMPLANT
GOWN STRL REUS W/TWL LRG LVL3 (GOWN DISPOSABLE) ×2
HANDLE YANKAUER SUCT BULB TIP (MISCELLANEOUS) ×1 IMPLANT
MANIFOLD NEPTUNE II (INSTRUMENTS) ×1 IMPLANT
NS IRRIG 500ML POUR BTL (IV SOLUTION) ×1 IMPLANT
PACK HEAD/NECK (MISCELLANEOUS) ×1 IMPLANT
PENCIL SMOKE EVACUATOR (MISCELLANEOUS) ×1 IMPLANT
SUCTION COAG ELEC 10 HAND CTRL (ELECTROSURGICAL) IMPLANT
SUCTION FRAZIER HANDLE 10FR (MISCELLANEOUS) ×1
SUCTION TUBE FRAZIER 10FR DISP (MISCELLANEOUS) ×1 IMPLANT
SUT CHROMIC 5 0 P 3 (SUTURE) IMPLANT
SUT PLAIN GUT FAST 5-0 (SUTURE) IMPLANT
SUT SILK 2 0 SH (SUTURE) IMPLANT
SUT VIC AB 4-0 RB1 18 (SUTURE) IMPLANT
SUT VIC AB 4-0 RB1 27 (SUTURE)
SUT VIC AB 4-0 RB1 27X BRD (SUTURE) IMPLANT
TRAP FLUID SMOKE EVACUATOR (MISCELLANEOUS) ×1 IMPLANT

## 2023-03-21 NOTE — Transfer of Care (Signed)
Immediate Anesthesia Transfer of Care Note  Patient: Sean Hayden  Procedure(s) Performed: PARTIAL GLOSSECTOMY (Left)  Patient Location: PACU  Anesthesia Type:General  Level of Consciousness: drowsy  Airway & Oxygen Therapy: Patient Spontanous Breathing and Patient connected to nasal cannula oxygen  Post-op Assessment: Report given to RN and Post -op Vital signs reviewed and stable  Post vital signs: Reviewed  Last Vitals:  Vitals Value Taken Time  BP 117/78 03/21/23 0846  Temp 36.4 C 03/21/23 0846  Pulse 78 03/21/23 0849  Resp 18 03/21/23 0849  SpO2 100 % 03/21/23 0849  Vitals shown include unvalidated device data.  Last Pain:  Vitals:   03/21/23 0846  TempSrc:   PainSc: Asleep         Complications: No notable events documented.

## 2023-03-21 NOTE — H&P (Signed)
H&P has been reviewed and patient reevaluated, no changes necessary. To be downloaded later.  

## 2023-03-21 NOTE — Anesthesia Procedure Notes (Signed)
Procedure Name: Intubation Date/Time: 03/21/2023 7:45 AM  Performed by: Merlene Pulling, CRNAPre-anesthesia Checklist: Patient identified, Patient being monitored, Timeout performed, Emergency Drugs available and Suction available Patient Re-evaluated:Patient Re-evaluated prior to induction Oxygen Delivery Method: Circle system utilized Preoxygenation: Pre-oxygenation with 100% oxygen Induction Type: IV induction Ventilation: Mask ventilation without difficulty Laryngoscope Size: McGraph and 4 Grade View: Grade I Tube type: Oral Tube size: 7.5 mm Number of attempts: 1 Airway Equipment and Method: Stylet Placement Confirmation: ETT inserted through vocal cords under direct vision, positive ETCO2 and breath sounds checked- equal and bilateral Secured at: 23 cm Tube secured with: Tape Dental Injury: Teeth and Oropharynx as per pre-operative assessment

## 2023-03-21 NOTE — Op Note (Signed)
03/21/2023  8:36 AM    Sean Hayden  [id number]   Pre-Op Dx: Left anterior ventral tongue lesion  Post-op Dx: Same  Proc: Excision left anterior ventral tongue lesion  Surg:  [facility] Sessions [name]  Anes:  GOT  EBL: 30 mL  Comp: None  Findings: He had an ulceration and scarring involving the anterior left side of his tongue.  He had previous surgery here 2 different times.  He had granulation on the ventral surface and a thick ball of firm tissue underneath this that extended to near midline involving the anterior third of the tongue the submandibular duct was evident in the lower left floor of mouth but was very near the scar tissue and had to be separated to make sure that the duct was not injured.  Procedure: The patient was brought to the operating room and placed in the supine position with his shoulder and neck supported.  He was given general anesthesia by oral endotracheal intubation and his tube was brought out the right side of his mouth.  This was secured.  A tooth bite block was placed in the right side where he had molars that were holding his teeth apart to allow better access to the tongue.  The clamp was placed to the anterior tongue help pull it forward so that I could visualize the tongue and feel the whole anterior mass on the left side of the tongue.  5 mL of 1% Xylocaine with epi 1: 100,000 was used for infiltration around the mass for vasoconstriction.  Using some long tenotomy scissors the mucosa just posterior to the submandibular duct was incised and the duct was freed up from the specimen.  This was done first to make sure that the duct was not injured.  The electrocautery was then used to outline the large mass and fullness that was in the left anterior tongue.  This removed much of the undersurface of the anterior quarter of the tongue on the left side.  Superiorly left a little bit of dorsal surface that was not involved so that this could pulled over for closure.  I  encircled entire mass and followed this deep to normal muscle underneath that make sure the entire mass was removed.  This extended third of the way back on the left side of the tongue and down to the posterior floor of mouth.  Once the entire mass was removed it was sent for permanent section.  Bleeding was controlled with electrocautery.  Closure the wound was then done with 4-0 Vicryl's.  First to pull together some of the deep muscle to help close the defect some.  Then I used 4-0 Vicryl's with buried knots to close the mucosa of the dorsal tongue down into the anterior floor of mouth to help cover over the soft tissue.  A portion of the anterior tongue mucosa was sewn to the ventral surface to leave the tongue tip free so it could move.  Multiple interrupted sutures were placed along the entire suture line to close this area.  I did not suture anything near the submandibular duct to make sure that this was not tied off.  There is good mobility of the tip of the tongue and could fall back into the mouth.  The bite-block was removed and tongue clamp removed as well.  Patient tolerated the procedure well.  He was awakened and taken to the recovery room in satisfactory condition.  There were no operative complications.  Dispo:   To  PACU to be discharged home  Plan: To follow-up in the office in 1 week for evaluation of the wound.  I have given him some Norco 5/325 mg to use for pain.  He will take full liquids to start with and can slowly increase his diet as tolerated.  He will call if he has any problems at home.  He can restart his Xarelto tomorrow.  Cammy Copa  03/21/2023 8:36 AM

## 2023-03-21 NOTE — Anesthesia Preprocedure Evaluation (Signed)
Anesthesia Evaluation  Patient identified by MRN, date of birth, ID band Patient awake    Reviewed: Allergy & Precautions, H&P , NPO status , Patient's Chart, lab work & pertinent test results, reviewed documented beta blocker date and time   History of Anesthesia Complications Negative for: history of anesthetic complications  Airway Mallampati: III  TM Distance: >3 FB Neck ROM: full    Dental  (+) Dental Advidsory Given, Partial Upper, Missing, Poor Dentition, Caps   Pulmonary neg shortness of breath, asthma , sleep apnea , neg COPD, neg recent URI, former smoker   Pulmonary exam normal breath sounds clear to auscultation       Cardiovascular Exercise Tolerance: Good hypertension, (-) angina + CAD and +CHF  (-) Past MI and (-) Cardiac Stents + dysrhythmias Atrial Fibrillation + Valvular Problems/Murmurs (s/p TAVR) AS  Rhythm:regular Rate:Normal     Neuro/Psych Seizures -, Well Controlled,  TIACVA, Residual Symptoms  negative psych ROS   GI/Hepatic negative GI ROS, Neg liver ROS,,,  Endo/Other  diabetes, Insulin Dependent    Renal/GU negative Renal ROS  negative genitourinary   Musculoskeletal   Abdominal   Peds  Hematology negative hematology ROS (+)   Anesthesia Other Findings Past Medical History: No date: Aortic stenosis     Comment:  a.) TTE 02/22/2016: moderate - severe AS (MPG 17); b.)               s/p TAVR 09/09/2022: 26 mm Edwards Sapien 3 Ultra Resilia No date: Asthma No date: B12 deficiency No date: Carotid stenosis, bilateral     Comment:  a.) carotid doppler 03/10/2012: 75-95% RICA, < 50 LICA;               b.) s/p RIGHT CEA 04/17/2012 with CorMatrix patch               angioplasty No date: Coronary artery disease No date: Gout No date: Heart murmur No date: Hemorrhoids No date: HFrEF (heart failure with reduced ejection fraction)     Comment:  a.) TTE 02/22/2016: EF 60-65%, mild LV dil, mod RA  dil,               mod-sev AS, G1DD; b.) TTE 09/10/2022: EF 40%, glob HK,               sep AK, mild BAE, mild TR, mod MR, pros AoV (MPG 3) No date: History of kidney stones No date: Hyperlipidemia No date: Hyperplastic colon polyp No date: Hypertension No date: Insomnia 02/21/2016: Ischemic cerebrovascular accident (CVA)     Comment:  a.) presented with LEFT sided weakness; b.) MRI brain               02/21/2016: acute infarct in the right medial parietal               lobe measuring 5 mm No date: Long term current use of anticoagulant     Comment:  a.) rivaroxaban No date: Neuropathy No date: Nonischemic congestive cardiomyopathy     Comment:  a.) TTE 02/22/2016: EF 60-65%; b.) TTE 09/10/2022: EF               40% 02/21/2016: Nose colonized with MRSA     Comment:  a.) surgical PCR (+) 02/21/2016 prior to ILR implant No date: NSVT (nonsustained ventricular tachycardia)     Comment:  a) holter 09/10/2022: longest episode 23 beats, and  fastest episode 164 bpm No date: PAF (paroxysmal atrial fibrillation)     Comment:  a.) CHA2DS2VASc = 8 (age x2, HFrEF, HTN, CVAx2, vascular              disease history, T2DM);  b.) s/p ILR implant 02/23/2016;               c.) rate/rhythm maintained on oral metoprolol tartrate;               chronically anticoagulated with rivaroxaban No date: Pneumonia 09/09/2022: S/P TAVR (transcatheter aortic valve replacement)     Comment:  a.) 26 mm Edwards Sapien 3 Ultra Resilia No date: Seizures No date: Skin cancer of face No date: T2DM (type 2 diabetes mellitus) No date: Tongue neoplasm   Reproductive/Obstetrics negative OB ROS                             Anesthesia Physical Anesthesia Plan  ASA: 3  Anesthesia Plan: General   Post-op Pain Management:    Induction: Intravenous  PONV Risk Score and Plan: 2 and Ondansetron, Dexamethasone and Treatment may vary due to age or medical condition  Airway  Management Planned: Oral ETT  Additional Equipment:   Intra-op Plan:   Post-operative Plan: Extubation in OR  Informed Consent: I have reviewed the patients History and Physical, chart, labs and discussed the procedure including the risks, benefits and alternatives for the proposed anesthesia with the patient or authorized representative who has indicated his/her understanding and acceptance.     Dental Advisory Given  Plan Discussed with: Anesthesiologist, CRNA and Surgeon  Anesthesia Plan Comments:        Anesthesia Quick Evaluation

## 2023-03-21 NOTE — Discharge Instructions (Signed)

## 2023-03-24 ENCOUNTER — Ambulatory Visit: Payer: Medicare HMO

## 2023-03-24 ENCOUNTER — Other Ambulatory Visit: Payer: Self-pay | Admitting: Anatomic Pathology & Clinical Pathology

## 2023-03-24 LAB — SURGICAL PATHOLOGY

## 2023-03-25 ENCOUNTER — Encounter: Payer: Self-pay | Admitting: Family Medicine

## 2023-03-25 DIAGNOSIS — D0007 Carcinoma in situ of tongue: Secondary | ICD-10-CM | POA: Insufficient documentation

## 2023-03-26 ENCOUNTER — Encounter: Payer: Self-pay | Admitting: *Deleted

## 2023-03-26 ENCOUNTER — Ambulatory Visit: Payer: Medicare HMO

## 2023-03-26 DIAGNOSIS — Z952 Presence of prosthetic heart valve: Secondary | ICD-10-CM

## 2023-03-26 NOTE — Progress Notes (Signed)
Cardiac Individual Treatment Plan  Patient Details  Name: Sean Hayden MRN: [id number] Date of Birth: January 19, 1946 Referring Provider:   Flowsheet Row Cardiac Rehab from 01/13/2023 in [facility] Cardiac and Pulmonary Rehab  Referring Provider Mila Merry MD       Initial Encounter Date:  Flowsheet Row Cardiac Rehab from 01/13/2023 in [facility] Cardiac and Pulmonary Rehab  Date 10/24/22       Visit Diagnosis: S/P TAVR (transcatheter aortic valve replacement)  Patient's Home Medications on Admission:  Current Outpatient Medications:    acyclovir (ZOVIRAX) 400 MG tablet, 1/2 tablet up to 5 times a day for cold sores (Patient taking differently: as needed. 1/2 tablet up to 5 times a day for cold sores), Disp: 30 tablet, Rfl: 5   albuterol (VENTOLIN HFA) 108 (90 Base) MCG/ACT inhaler, Inhale 2 puffs into the lungs every 6 (six) hours as needed for wheezing or shortness of breath., Disp: , Rfl:    atorvastatin (LIPITOR) 20 MG tablet, Take 1 tablet (20 mg total) by mouth every evening. (Patient taking differently: Take 40 mg by mouth every evening.), Disp: 90 tablet, Rfl: 3   benzocaine (ORAJEL) 10 % mucosal gel, Use as directed 1 application in the mouth or throat as needed for mouth pain., Disp: , Rfl:    Cholecalciferol (VITAMIN D PO), Take 2,000 Units by mouth daily. , Disp: , Rfl:    Docosanol 10 % CREA, Apply topically up to 5 times per day as needed until heals, Disp: 2 g, Rfl: 3   empagliflozin (JARDIANCE) 25 MG TABS tablet, Take 25 mg by mouth daily., Disp: 90 tablet, Rfl: 4   Flaxseed, Linseed, (FLAXSEED OIL) 1000 MG CAPS, Take 1,000 mg by mouth 2 (two) times daily., Disp: , Rfl:    fluticasone (FLONASE) 50 MCG/ACT nasal spray, Place 2 sprays into both nostrils daily. (Patient taking differently: Place 2 sprays into both nostrils as needed.), Disp: 16 g, Rfl: 1   furosemide (LASIX) 20 MG tablet, TAKE ONE (1) TABLET BY MOUTH ONCE DAILY AS NEEDED FOR EDEMA (TAKE 1 TABLET DAILYAS NEEDED FOR  WEIGHT GAIN > 2 POUNDS IN 24 HOURS), Disp: 30 tablet, Rfl: 1   gabapentin (NEURONTIN) 100 MG capsule, Take 1 capsule (100 mg total) by mouth 2 (two) times daily., Disp: 180 capsule, Rfl: 1   HYDROcodone-acetaminophen (NORCO/VICODIN) 5-325 MG tablet, Take 1 tablet by mouth every 4 (four) hours as needed for moderate pain., Disp: 12 tablet, Rfl: 0   HYDROcodone-acetaminophen (NORCO/VICODIN) 5-325 MG tablet, Take 1-2 tablets by mouth every 6 (six) hours as needed for up to 7 days for moderate pain., Disp: 30 tablet, Rfl: 0   insulin detemir (LEVEMIR) 100 UNIT/ML injection, Inject 0.3 mLs (30 Units total) into the skin 2 (two) times daily. (Patient taking differently: Inject 60 Units into the skin 2 (two) times daily.), Disp: 100 mL, Rfl: 1   levETIRAcetam (KEPPRA) 750 MG tablet, Take 750 mg by mouth 2 (two) times daily., Disp: , Rfl:    lisinopril (ZESTRIL) 2.5 MG tablet, Take 5 mg by mouth daily., Disp: , Rfl:    loratadine (CLARITIN) 10 MG tablet, Take 10 mg by mouth daily., Disp: , Rfl:    metFORMIN (GLUCOPHAGE) 850 MG tablet, Take 1 tablet (850 mg total) by mouth 2 (two) times daily with a meal., Disp: 180 tablet, Rfl: 1   metoprolol tartrate (LOPRESSOR) 25 MG tablet, Take 0.5 tablets by mouth every 12 (twelve) hours., Disp: , Rfl:    Misc Natural Products (OSTEO BI-FLEX  TRIPLE STRENGTH PO), Take 1 tablet by mouth 2 (two) times daily. , Disp: , Rfl:    MISC NATURAL PRODUCTS PO, Take 1 capsule by mouth in the morning and at bedtime. Super Beta Prostate, Disp: , Rfl:    Omega-3 Fatty Acids (FISH OIL) 1000 MG CPDR, Take 1,200 mg by mouth 2 (two) times daily. , Disp: , Rfl:    PARoxetine (PAXIL) 40 MG tablet, TAKE 1 TABLET BY MOUTH DAILY., Disp: 30 tablet, Rfl: 0   rivaroxaban (XARELTO) 20 MG TABS tablet, TAKE ONE TABLET BY MOUTH DAILY WITH SUPPER, Disp: 30 tablet, Rfl: 12  Past Medical History: Past Medical History:  Diagnosis Date   Aortic stenosis    a.) TTE 02/22/2016: moderate - severe AS (MPG  17); b.) s/p TAVR 09/09/2022: 26 mm Edwards Sapien 3 Ultra Resilia   Asthma    B12 deficiency    Carotid stenosis, bilateral    a.) carotid doppler 03/10/2012: 75-95% RICA, < 50 LICA; b.) s/p RIGHT CEA 04/17/2012 with CorMatrix patch angioplasty   Coronary artery disease    Gout    Heart murmur    Hemorrhoids    HFrEF (heart failure with reduced ejection fraction)    a.) TTE 02/22/2016: EF 60-65%, mild LV dil, mod RA dil, mod-sev AS, G1DD; b.) TTE 09/10/2022: EF 40%, glob HK, sep AK, mild BAE, mild TR, mod MR, pros AoV (MPG 3)   History of kidney stones    Hyperlipidemia    Hyperplastic colon polyp    Hypertension    Insomnia    Ischemic cerebrovascular accident (CVA) 02/21/2016   a.) presented with LEFT sided weakness; b.) MRI brain 02/21/2016: acute infarct in the right medial parietal lobe measuring 5 mm   Long term current use of anticoagulant    a.) rivaroxaban   Neuropathy    Nonischemic congestive cardiomyopathy    a.) TTE 02/22/2016: EF 60-65%; b.) TTE 09/10/2022: EF 40%   Nose colonized with MRSA 02/21/2016   a.) surgical PCR (+) 02/21/2016 prior to ILR implant   NSVT (nonsustained ventricular tachycardia)    a) holter 09/10/2022: longest episode 23 beats, and fastest episode 164 bpm   PAF (paroxysmal atrial fibrillation)    a.) CHA2DS2VASc = 8 (age x2, HFrEF, HTN, CVAx2, vascular disease history, T2DM);  b.) s/p ILR implant 02/23/2016; c.) rate/rhythm maintained on oral metoprolol tartrate; chronically anticoagulated with rivaroxaban   Pneumonia    S/P TAVR (transcatheter aortic valve replacement) 09/09/2022   a.) 26 mm Edwards Sapien 3 Ultra Resilia   Seizures    Skin cancer of face    T2DM (type 2 diabetes mellitus)    Tongue neoplasm     Tobacco Use: Social History   Tobacco Use  Smoking Status Former   Types: Cigars   Quit date: 04/08/2012   Years since quitting: 10.9  Smokeless Tobacco Never  Tobacco Comments   cigarettes and cigars  all quit 12 years ago     Labs: Review Flowsheet  More data exists      Latest Ref Rng & Units 02/07/2020 05/16/2020 07/18/2021 01/25/2022 05/20/2022  Labs for ITP Cardiac and Pulmonary Rehab  Cholestrol 100 - 199 mg/dL 638     - 756  433  -  LDL (calc) 0 - 99 mg/dL 49     - 51  45  -  HDL-C >39 mg/dL 37     - 34  33  -  Trlycerides 0 - 149 mg/dL 295     -  231  200  -  Hemoglobin A1c 4.0 - 5.6 % 7.7     7.6  8.9  8.5  7.6     Details       This result is from an external source.          Exercise Target Goals: Exercise Program Goal: Individual exercise prescription set using results from initial 6 min walk test and THRR while considering  patient's activity barriers and safety.   Exercise Prescription Goal: Initial exercise prescription builds to 30-45 minutes a day of aerobic activity, 2-3 days per week.  Home exercise guidelines will be given to patient during program as part of exercise prescription that the participant will acknowledge.   Education: Aerobic Exercise: - Group verbal and visual presentation on the components of exercise prescription. Introduces F.I.T.T principle from ACSM for exercise prescriptions.  Reviews F.I.T.T. principles of aerobic exercise including progression. Written material given at graduation. Flowsheet Row Cardiac Rehab from 10/24/2022 in [facility] Cardiac and Pulmonary Rehab  Education need identified 10/24/22       Education: Resistance Exercise: - Group verbal and visual presentation on the components of exercise prescription. Introduces F.I.T.T principle from ACSM for exercise prescriptions  Reviews F.I.T.T. principles of resistance exercise including progression. Written material given at graduation.    Education: Exercise & Equipment Safety: - Individual verbal instruction and demonstration of equipment use and safety with use of the equipment. Flowsheet Row Cardiac Rehab from 10/24/2022 in [facility] Cardiac and Pulmonary Rehab  Education need identified 10/24/22   Date 10/24/22  Educator KL  Instruction Review Code 1- Verbalizes Understanding       Education: Exercise Physiology & General Exercise Guidelines: - Group verbal and written instruction with models to review the exercise physiology of the cardiovascular system and associated critical values. Provides general exercise guidelines with specific guidelines to those with heart or lung disease.  Flowsheet Row Cardiac Rehab from 10/24/2022 in [facility] Cardiac and Pulmonary Rehab  Education need identified 10/24/22       Education: Flexibility, Balance, Mind/Body Relaxation: - Group verbal and visual presentation with interactive activity on the components of exercise prescription. Introduces F.I.T.T principle from ACSM for exercise prescriptions. Reviews F.I.T.T. principles of flexibility and balance exercise training including progression. Also discusses the mind body connection.  Reviews various relaxation techniques to help reduce and manage stress (i.e. Deep breathing, progressive muscle relaxation, and visualization). Balance handout provided to take home. Written material given at graduation.   Activity Barriers & Risk Stratification:   6 Minute Walk:  6 Minute Walk     Row Name 01/13/23 1644         6 Minute Walk   Phase Initial     Distance 657 feet     Walk Time 6 minutes     # of Rest Breaks 0     MPH 1.24     METS 1.43     RPE 13     Perceived Dyspnea  0     VO2 Peak 5     Symptoms Yes (comment)     Comments Legs fatigued     Resting HR 102 bpm     Resting BP 104/58     Resting Oxygen Saturation  96 %     Exercise Oxygen Saturation  during 6 min walk 95 %     Max Ex. HR 156 bpm     Max Ex. BP 128/62     2 Minute Post BP 108/64  Oxygen Initial Assessment:   Oxygen Re-Evaluation:   Oxygen Discharge (Final Oxygen Re-Evaluation):   Initial Exercise Prescription:  Initial Exercise Prescription - 01/13/23 1600       Date of Initial Exercise  RX and Referring Provider   Date 10/24/22    Referring Provider Mila Merry MD      Oxygen   Maintain Oxygen Saturation 88% or higher      Recumbant Bike   Level 1    RPM 60    Watts 10    Minutes 15    METs 1      NuStep   Level 1    SPM 80    Minutes 15    METs 1      T5 Nustep   Level 1    SPM 80    Minutes 15    METs 1      Track   Laps 8    Minutes 15    METs 1.44      Prescription Details   Frequency (times per week) 2    Duration Progress to 30 minutes of continuous aerobic without signs/symptoms of physical distress      Intensity   THRR 40-80% of Max Heartrate 118-134    Ratings of Perceived Exertion 11-13    Perceived Dyspnea 0-4      Progression   Progression Continue to progress workloads to maintain intensity without signs/symptoms of physical distress.      Resistance Training   Training Prescription Yes    Weight 2 lb    Reps 10-15             Perform Capillary Blood Glucose checks as needed.  Exercise Prescription Changes:   Exercise Prescription Changes     Row Name 01/13/23 1600 01/28/23 1500 02/12/23 1500 02/25/23 1300 03/13/23 0900     Response to Exercise   Blood Pressure (Admit) 104/58 104/56 98/56 106/60 122/66   Blood Pressure (Exercise) 128/62 132/64 114/62 130/60 130/60   Blood Pressure (Exit) 108/64 110/58 90/54 102/60 104/60   Heart Rate (Admit) 102 bpm 70 bpm 80 bpm 85 bpm 82 bpm   Heart Rate (Exercise) 156 bpm 103 bpm 103 bpm 108 bpm 102 bpm   Heart Rate (Exit) 98 bpm 81 bpm 75 bpm 86 bpm 91 bpm   Oxygen Saturation (Admit) 96 % -- -- -- --   Oxygen Saturation (Exercise) 95 % -- -- -- --   Rating of Perceived Exertion (Exercise) 13 15 15 15 15    Perceived Dyspnea (Exercise) 0 -- -- -- --   Symptoms Legs fatigued none none none none   Comments Results 1st week back after being out since December -- return after fall --   Duration -- Progress to 30 minutes of  aerobic without signs/symptoms of physical  distress Progress to 30 minutes of  aerobic without signs/symptoms of physical distress Continue with 30 min of aerobic exercise without signs/symptoms of physical distress. Continue with 30 min of aerobic exercise without signs/symptoms of physical distress.   Intensity -- THRR unchanged THRR unchanged THRR unchanged THRR unchanged     Progression   Progression -- Continue to progress workloads to maintain intensity without signs/symptoms of physical distress. Continue to progress workloads to maintain intensity without signs/symptoms of physical distress. Continue to progress workloads to maintain intensity without signs/symptoms of physical distress. Continue to progress workloads to maintain intensity without signs/symptoms of physical distress.   Average METs -- 1.78 1.75 1.75 1.95  Resistance Training   Training Prescription -- Yes Yes Yes Yes   Weight -- 3 lb 3 lb 3 lb 3 lb   Reps -- 10-15 10-15 10-15 10-15     Interval Training   Interval Training -- -- No No No     NuStep   Level -- -- -- -- 2   Minutes -- -- -- -- 15   METs -- -- -- -- 2.3     REL-XR   Level -- -- 1 -- --   Minutes -- -- 15 -- --   METs -- -- 2 -- --     T5 Nustep   Level -- 2 -- 2 2   Minutes -- 15 -- 15 15   METs -- 2 -- 2 1.9     Track   Laps -- 16 9 -- 24   Minutes -- 15 15 -- 15   METs -- 1.87 1.49 -- 2.31     Oxygen   Maintain Oxygen Saturation -- 88% or higher 88% or higher 88% or higher 88% or higher    Row Name 03/17/23 1600             Home Exercise Plan   Plans to continue exercise at Home (comment)  seated stepper, leg ergometer, youtube staff videos       Frequency Add 2 additional days to program exercise sessions.       Initial Home Exercises Provided 03/17/23         Oxygen   Maintain Oxygen Saturation 88% or higher                Exercise Comments:   Exercise Goals and Review:   Exercise Goals Re-Evaluation :  Exercise Goals Re-Evaluation     Row Name  01/28/23 1534 02/12/23 1532 02/25/23 1330 03/12/23 1606 03/13/23 1002     Exercise Goal Re-Evaluation   Exercise Goals Review Increase Physical Activity;Increase Strength and Stamina;Understanding of Exercise Prescription Increase Physical Activity;Increase Strength and Stamina;Understanding of Exercise Prescription Increase Physical Activity;Increase Strength and Stamina;Understanding of Exercise Prescription Increase Physical Activity;Increase Strength and Stamina;Understanding of Exercise Prescription Increase Physical Activity;Increase Strength and Stamina;Understanding of Exercise Prescription   Comments Malakye has returned back to rehab after being out since December after a hospital admission. Upon return, he was able to walk 16 laps on the track his first day back. He has also increased to level 2 on the T5 Nustep and now using 3 lb for handweights. We will continue to monitor as he continues to progress and come consistently. Tyrie has only attended rehab once since the last review. He was only able to do 9 laps on the track, where he was previously doing 16. He did do well with his first time on the XR at level 1. He also has continued to use 3 lb hand weights for resistance training. We will continue to monitor his progress in the program. Wacey was out for a little while as he had a fall. He has returned and exercised 2 sessions as he is easing back into it. He exercised on level 2 on the T5 Nustep. Will slowly introduce walking back into his regimen when he feels ready. Will continue to monitor. Keatyn is going to exercise at home when he is done with the program. He fell not to long ago and has not been able to get walking since. His  fall was about three or four weeks ago. His wife has a T4 NuStep  at home for him to use. Daion is doing well in the program. He recently increased his overall average MET level to 1.95 METs. He also improved to level 2 on the T4 nustep. He was also able to increase  his laps on the track from 16 to 24 laps. We will continue to monitor his progress in the program.   Expected Outcomes Short: Maintain good attendance Long: Increase overall strength and stamina Short: Attend rehab regularly, increase laps on the track. Long: Continue to improve strength and stamina. Short: ease back into exercise slowly Long: Continue to increase overall MET level and strength Short: start exercise at home. Long: maintain exercise at home independently. Short: Continue to push for more laps on the track. Long: Continue to improve strength and stamina.    Row Name 03/17/23 1611             Exercise Goal Re-Evaluation   Exercise Goals Review Increase Physical Activity;Increase Strength and Stamina;Understanding of Exercise Prescription       Comments Reviewed home exercise with pt today.  Pt plans to work on his seated stepper (he is not sure what it is called), leg ergometer, and youtube staff videos (seated primarily) for exercise.  Encouraged patient to stay compliant with walker when moving around. There are some balance concerns with patient and he is aware to be safe. Will be more safe to complete seated exercises, encouraged to some walking here at rehab to monitor for now. Reviewed THR, pulse, RPE, sign and symptoms, pulse oximetery and when to call 911 or MD.  Also discussed weather considerations and indoor options.  Pt voiced understanding.       Expected Outcomes Short: Add on 1 day of exercise, slowly build up to 30 minutes as tolerated                Discharge Exercise Prescription (Final Exercise Prescription Changes):  Exercise Prescription Changes - 03/17/23 1600       Home Exercise Plan   Plans to continue exercise at Home (comment)   seated stepper, leg ergometer, youtube staff videos   Frequency Add 2 additional days to program exercise sessions.    Initial Home Exercises Provided 03/17/23      Oxygen   Maintain Oxygen Saturation 88% or higher              Nutrition:  Target Goals: Understanding of nutrition guidelines, daily intake of sodium 1500mg , cholesterol 200mg , calories 30% from fat and 7% or less from saturated fats, daily to have 5 or more servings of fruits and vegetables.  Education: All About Nutrition: -Group instruction provided by verbal, written material, interactive activities, discussions, models, and posters to present general guidelines for heart healthy nutrition including fat, fiber, MyPlate, the role of sodium in heart healthy nutrition, utilization of the nutrition label, and utilization of this knowledge for meal planning. Follow up email sent as well. Written material given at graduation. Flowsheet Row Cardiac Rehab from 10/24/2022 in [facility] Cardiac and Pulmonary Rehab  Education need identified 10/24/22       Biometrics:  Pre Biometrics - 01/13/23 1646       Pre Biometrics   Height 5' 6.5" (1.689 m)    Weight 224 lb 8 oz (101.8 kg)    BMI (Calculated) 35.7    Single Leg Stand 0 seconds              Nutrition Therapy Plan and Nutrition Goals:  Nutrition Therapy & Goals -  03/17/23 1614       Nutrition Therapy   RD appointment deferred Yes   Patient would not like to meet with RD at this time.            Nutrition Assessments:  MEDIFICTS Score Key: ?70 Need to make dietary changes  40-70 Heart Healthy Diet ? 40 Therapeutic Level Cholesterol Diet  Flowsheet Row Cardiac Rehab from 10/24/2022 in [facility] Cardiac and Pulmonary Rehab  Picture Your Plate Total Score on Admission 51      Picture Your Plate Scores: <16 Unhealthy dietary pattern with much room for improvement. 41-50 Dietary pattern unlikely to meet recommendations for good health and room for improvement. 51-60 More healthful dietary pattern, with some room for improvement.  >60 Healthy dietary pattern, although there may be some specific behaviors that could be improved.    Nutrition Goals Re-Evaluation:  Nutrition  Goals Re-Evaluation     Row Name 03/12/23 1612             Goals   Current Weight 232 lb (105.2 kg)       Comment Tyjay defers RD appointment.                Nutrition Goals Discharge (Final Nutrition Goals Re-Evaluation):  Nutrition Goals Re-Evaluation - 03/12/23 1612       Goals   Current Weight 232 lb (105.2 kg)    Comment Mikale defers RD appointment.             Psychosocial: Target Goals: Acknowledge presence or absence of significant depression and/or stress, maximize coping skills, provide positive support system. Participant is able to verbalize types and ability to use techniques and skills needed for reducing stress and depression.   Education: Stress, Anxiety, and Depression - Group verbal and visual presentation to define topics covered.  Reviews how body is impacted by stress, anxiety, and depression.  Also discusses healthy ways to reduce stress and to treat/manage anxiety and depression.  Written material given at graduation.   Education: Sleep Hygiene -Provides group verbal and written instruction about how sleep can affect your health.  Define sleep hygiene, discuss sleep cycles and impact of sleep habits. Review good sleep hygiene tips.    Initial Review & Psychosocial Screening:   Quality of Life Scores:   Scores of 19 and below usually indicate a poorer quality of life in these areas.  A difference of  2-3 points is a clinically meaningful difference.  A difference of 2-3 points in the total score of the Quality of Life Index has been associated with significant improvement in overall quality of life, self-image, physical symptoms, and general health in studies assessing change in quality of life.  PHQ-9: Review Flowsheet  More data exists      01/06/2023 10/24/2022 01/03/2022 07/18/2021 12/28/2020  Depression screen PHQ 2/9  Decreased Interest 0 0 0 2 0  Down, Depressed, Hopeless 0 1 0 1 0  PHQ - 2 Score 0 1 0 3 0  Altered sleeping 0 0 - 3  -  Tired, decreased energy 0 1 - 3 -  Change in appetite 0 1 - 3 -  Feeling bad or failure about yourself  0 0 - 0 -  Trouble concentrating 0 1 - 0 -  Moving slowly or fidgety/restless 0 0 - 0 -  Suicidal thoughts 0 0 - 0 -  PHQ-9 Score 0 4 - 12 -  Difficult doing work/chores Not difficult at all Somewhat difficult - Not difficult  at all -   Interpretation of Total Score  Total Score Depression Severity:  1-4 = Minimal depression, 5-9 = Mild depression, 10-14 = Moderate depression, 15-19 = Moderately severe depression, 20-27 = Severe depression   Psychosocial Evaluation and Intervention:  Psychosocial Evaluation - 03/17/23 1614       Psychosocial Evaluation & Interventions   Interventions Encouraged to exercise with the program and follow exercise prescription    Comments Enjoying doing mow lawn, starting to ask someone else to do it if it gets too much.    Continue Psychosocial Services  Follow up required by staff             Psychosocial Re-Evaluation:  Psychosocial Re-Evaluation     Row Name 03/12/23 1609 03/17/23 1625           Psychosocial Re-Evaluation   Current issues with Current Sleep Concerns --      Comments Drue has not been able to sleep well ever since his fall. He states he stopped taking his ambien since it made his sugar high. He does not want to continue to take any sleeping aids. --      Expected Outcomes Short: work on sleeping patterns. Long: maintain adequate sleep. --      Interventions Encouraged to attend Cardiac Rehabilitation for the exercise --      Continue Psychosocial Services  Follow up required by staff --               Psychosocial Discharge (Final Psychosocial Re-Evaluation):  Psychosocial Re-Evaluation - 03/17/23 1625       Psychosocial Re-Evaluation   Current issues with --    Comments --    Interventions --    Continue Psychosocial Services  --             Vocational Rehabilitation: Provide vocational rehab  assistance to qualifying candidates.   Vocational Rehab Evaluation & Intervention:   Education: Education Goals: Education classes will be provided on a variety of topics geared toward better understanding of heart health and risk factor modification. Participant will state understanding/return demonstration of topics presented as noted by education test scores.  Learning Barriers/Preferences:   General Cardiac Education Topics:  AED/CPR: - Group verbal and written instruction with the use of models to demonstrate the basic use of the AED with the basic ABC's of resuscitation.   Anatomy and Cardiac Procedures: - Group verbal and visual presentation and models provide information about basic cardiac anatomy and function. Reviews the testing methods done to diagnose heart disease and the outcomes of the test results. Describes the treatment choices: Medical Management, Angioplasty, or Coronary Bypass Surgery for treating various heart conditions including Myocardial Infarction, Angina, Valve Disease, and Cardiac Arrhythmias.  Written material given at graduation. Flowsheet Row Cardiac Rehab from 10/24/2022 in [facility] Cardiac and Pulmonary Rehab  Education need identified 10/24/22       Medication Safety: - Group verbal and visual instruction to review commonly prescribed medications for heart and lung disease. Reviews the medication, class of the drug, and side effects. Includes the steps to properly store meds and maintain the prescription regimen.  Written material given at graduation.   Intimacy: - Group verbal instruction through game format to discuss how heart and lung disease can affect sexual intimacy. Written material given at graduation..   Know Your Numbers and Heart Failure: - Group verbal and visual instruction to discuss disease risk factors for cardiac and pulmonary disease and treatment options.  Reviews associated critical values  for Overweight/Obesity, Hypertension,  Cholesterol, and Diabetes.  Discusses basics of heart failure: signs/symptoms and treatments.  Introduces Heart Failure Zone chart for action plan for heart failure.  Written material given at graduation.   Infection Prevention: - Provides verbal and written material to individual with discussion of infection control including proper hand washing and proper equipment cleaning during exercise session. Flowsheet Row Cardiac Rehab from 10/24/2022 in [facility] Cardiac and Pulmonary Rehab  Education need identified 10/24/22  Date 10/24/22  Educator KL  Instruction Review Code 1- Verbalizes Understanding       Falls Prevention: - Provides verbal and written material to individual with discussion of falls prevention and safety. Flowsheet Row Cardiac Rehab from 10/16/2022 in [facility] Cardiac and Pulmonary Rehab  Date 10/16/22  Educator SB  Instruction Review Code 1- Verbalizes Understanding       Other: -Provides group and verbal instruction on various topics (see comments)   Knowledge Questionnaire Score:   Core Components/Risk Factors/Patient Goals at Admission:  Personal Goals and Risk Factors at Admission - 01/13/23 1653       Core Components/Risk Factors/Patient Goals on Admission    Weight Management Yes;Weight Loss;Obesity    Intervention Weight Management: Develop a combined nutrition and exercise program designed to reach desired caloric intake, while maintaining appropriate intake of nutrient and fiber, sodium and fats, and appropriate energy expenditure required for the weight goal.;Weight Management/Obesity: Establish reasonable short term and long term weight goals.;Weight Management: Provide education and appropriate resources to help participant work on and attain dietary goals.    Admit Weight 224 lb 8 oz (101.8 kg)    Goal Weight: Long Term 200 lb (90.7 kg)    Expected Outcomes Short Term: Continue to assess and modify interventions until short term weight is achieved;Long  Term: Adherence to nutrition and physical activity/exercise program aimed toward attainment of established weight goal;Weight Loss: Understanding of general recommendations for a balanced deficit meal plan, which promotes 1-2 lb weight loss per week and includes a negative energy balance of [phone] kcal/d;Understanding recommendations for meals to include 15-35% energy as protein, 25-35% energy from fat, 35-60% energy from carbohydrates, less than 200mg  of dietary cholesterol, 20-35 gm of total fiber daily;Understanding of distribution of calorie intake throughout the day with the consumption of 4-5 meals/snacks    Diabetes Yes    Intervention Provide education about signs/symptoms and action to take for hypo/hyperglycemia.;Provide education about proper nutrition, including hydration, and aerobic/resistive exercise prescription along with prescribed medications to achieve blood glucose in normal ranges: Fasting glucose 65-99 mg/dL    Expected Outcomes Short Term: Participant verbalizes understanding of the signs/symptoms and immediate care of hyper/hypoglycemia, proper foot care and importance of medication, aerobic/resistive exercise and nutrition plan for blood glucose control.;Long Term: Attainment of HbA1C < 7%.    Heart Failure Yes    Intervention Provide a combined exercise and nutrition program that is supplemented with education, support and counseling about heart failure. Directed toward relieving symptoms such as shortness of breath, decreased exercise tolerance, and extremity edema.    Expected Outcomes Improve functional capacity of life;Short term: Attendance in program 2-3 days a week with increased exercise capacity. Reported lower sodium intake. Reported increased fruit and vegetable intake. Reports medication compliance.;Short term: Daily weights obtained and reported for increase. Utilizing diuretic protocols set by physician.;Long term: Adoption of self-care skills and reduction of  barriers for early signs and symptoms recognition and intervention leading to self-care maintenance.    Hypertension Yes    Intervention Provide  education on lifestyle modifcations including regular physical activity/exercise, weight management, moderate sodium restriction and increased consumption of fresh fruit, vegetables, and low fat dairy, alcohol moderation, and smoking cessation.;Monitor prescription use compliance.    Expected Outcomes Short Term: Continued assessment and intervention until BP is < 140/83mm HG in hypertensive participants. < 130/9mm HG in hypertensive participants with diabetes, heart failure or chronic kidney disease.;Long Term: Maintenance of blood pressure at goal levels.    Lipids Yes    Intervention Provide education and support for participant on nutrition & aerobic/resistive exercise along with prescribed medications to achieve LDL 70mg , HDL >40mg .    Expected Outcomes Short Term: Participant states understanding of desired cholesterol values and is compliant with medications prescribed. Participant is following exercise prescription and nutrition guidelines.;Long Term: Cholesterol controlled with medications as prescribed, with individualized exercise RX and with personalized nutrition plan. Value goals: LDL < , HDL > 40 mg.             Education:Diabetes - Individual verbal and written instruction to review signs/symptoms of diabetes, desired ranges of glucose level fasting, after meals and with exercise. Acknowledge that pre and post exercise glucose checks will be done for 3 sessions at entry of program. Flowsheet Row Cardiac Rehab from 10/24/2022 in [facility] Cardiac and Pulmonary Rehab  Education need identified 10/24/22  Date 10/24/22  Educator KL  Instruction Review Code 1- Verbalizes Understanding       Core Components/Risk Factors/Patient Goals Review:   Goals and Risk Factor Review     Row Name 03/12/23 1613 03/17/23 1616           Core  Components/Risk Factors/Patient Goals Review   Personal Goals Review Weight Management/Obesity;Hypertension Weight Management/Obesity;Hypertension;Diabetes      Review Jahshua has been taking lasix and trying to manage his weight. He wants to keep his weight around 230 pounds. Malacai is checking his blood pressure at home. He has surgery on his tongue April 12th. He is to keep staff informed if he is going to be able to come to rehab. Patient states he is checking his blood sugar daily, it usually ranges between 120-130 fasting. He knows to notify MD of any abnormal value between BG, weight, and/or BP.      Expected Outcomes Short: check blood pressures at home. Long: maintain blood pressures independently. Short: Continue to watch sugars Long: Continue to manage all lifestyle risk factors               Core Components/Risk Factors/Patient Goals at Discharge (Final Review):   Goals and Risk Factor Review - 03/17/23 1616       Core Components/Risk Factors/Patient Goals Review   Personal Goals Review Weight Management/Obesity;Hypertension;Diabetes    Review Patient states he is checking his blood sugar daily, it usually ranges between 120-130 fasting. He knows to notify MD of any abnormal value between BG, weight, and/or BP.    Expected Outcomes Short: Continue to watch sugars Long: Continue to manage all lifestyle risk factors             ITP Comments:  ITP Comments     Row Name 01/13/23 1638 01/29/23 1222 02/12/23 1201 02/26/23 0808 03/26/23 0830   ITP Comments Patient returned to cardiac rehab after insurance change.  6 minute walk test performed and updated ITP created. 30 day review completed. ITP sent to Dr. Bethann Punches, Medical Director of Cardiac Rehab. Continue with ITP unless changes are made by physician. Spoke with patient- states he has been trying  to recover from his fall- caused by tripping. Hasn't been to rehab since 2/21. Feels sore but was told he did not break anything.  Sees his doctor tomorrow for a follow up and will ask if he has any limitations. Due to come back to rehab on 3/11 unless told otherwise. 30 Day review completed. Medical Director ITP review done, changes made as directed, and signed approval by Medical Director.    returned 30 day review completed. ITP sent to Dr. Bethann Punches, Medical Director of Cardiac Rehab. Continue with ITP unless changes are made by physician.            Comments: 30 day review

## 2023-03-26 NOTE — Anesthesia Postprocedure Evaluation (Signed)
Anesthesia Post Note  Patient: Pamela W Stoke  Procedure(s) Performed: PARTIAL GLOSSECTOMY (Left)  Patient location during evaluation: PACU Anesthesia Type: General Level of consciousness: awake and alert Pain management: pain level controlled Vital Signs Assessment: post-procedure vital signs reviewed and stable Respiratory status: spontaneous breathing, nonlabored ventilation, respiratory function stable and patient connected to nasal cannula oxygen Cardiovascular status: blood pressure returned to baseline and stable Postop Assessment: no apparent nausea or vomiting Anesthetic complications: no   No notable events documented.   Last Vitals:  Vitals:   03/21/23 1125 03/21/23 1133  BP: 126/80 (!) 140/81  Pulse: 65 90  Resp: 17 18  Temp:  (!) 36.2 C  SpO2: 90% 94%    Last Pain:  Vitals:   03/21/23 1133  TempSrc: Temporal  PainSc: 6                  Arlenis Blaydes      

## 2023-03-26 NOTE — Anesthesia Postprocedure Evaluation (Signed)
Anesthesia Post Note  Patient: Sean Hayden  Procedure(s) Performed: PARTIAL GLOSSECTOMY (Left)  Patient location during evaluation: PACU Anesthesia Type: General Level of consciousness: awake and alert Pain management: pain level controlled Vital Signs Assessment: post-procedure vital signs reviewed and stable Respiratory status: spontaneous breathing, nonlabored ventilation, respiratory function stable and patient connected to nasal cannula oxygen Cardiovascular status: blood pressure returned to baseline and stable Postop Assessment: no apparent nausea or vomiting Anesthetic complications: no   No notable events documented.   Last Vitals:  Vitals:   03/21/23 1125 03/21/23 1133  BP: 126/80 (!) 140/81  Pulse: 65 90  Resp: 17 18  Temp:  (!) 36.2 C  SpO2: 90% 94%    Last Pain:  Vitals:   03/21/23 1133  TempSrc: Temporal  PainSc: 6                  Lenard Simmer

## 2023-04-02 ENCOUNTER — Telehealth: Payer: Self-pay

## 2023-04-02 ENCOUNTER — Ambulatory Visit
Admission: RE | Admit: 2023-04-02 | Discharge: 2023-04-02 | Disposition: A | Discharge status: Home / Self Care | Payer: Medicare HMO | Source: Ambulatory Visit | Attending: Radiation Oncology | Admitting: Radiation Oncology

## 2023-04-02 ENCOUNTER — Other Ambulatory Visit: Payer: Self-pay | Admitting: *Deleted

## 2023-04-02 ENCOUNTER — Encounter: Payer: Self-pay | Admitting: Radiation Oncology

## 2023-04-02 VITALS — BP 82/57 | HR 65 | Temp 96.8°F | Ht 67.0 in

## 2023-04-02 DIAGNOSIS — Z794 Long term (current) use of insulin: Secondary | ICD-10-CM | POA: Diagnosis not present

## 2023-04-02 DIAGNOSIS — C023 Malignant neoplasm of anterior two-thirds of tongue, part unspecified: Secondary | ICD-10-CM | POA: Insufficient documentation

## 2023-04-02 DIAGNOSIS — Z7901 Long term (current) use of anticoagulants: Secondary | ICD-10-CM | POA: Diagnosis not present

## 2023-04-02 DIAGNOSIS — D0007 Carcinoma in situ of tongue: Secondary | ICD-10-CM

## 2023-04-02 DIAGNOSIS — Z87891 Personal history of nicotine dependence: Secondary | ICD-10-CM | POA: Insufficient documentation

## 2023-04-02 DIAGNOSIS — Z85828 Personal history of other malignant neoplasm of skin: Secondary | ICD-10-CM | POA: Insufficient documentation

## 2023-04-02 DIAGNOSIS — Z87442 Personal history of urinary calculi: Secondary | ICD-10-CM | POA: Insufficient documentation

## 2023-04-02 DIAGNOSIS — E785 Hyperlipidemia, unspecified: Secondary | ICD-10-CM | POA: Diagnosis not present

## 2023-04-02 DIAGNOSIS — I480 Paroxysmal atrial fibrillation: Secondary | ICD-10-CM | POA: Diagnosis not present

## 2023-04-02 DIAGNOSIS — I420 Dilated cardiomyopathy: Secondary | ICD-10-CM | POA: Insufficient documentation

## 2023-04-02 DIAGNOSIS — Z8673 Personal history of transient ischemic attack (TIA), and cerebral infarction without residual deficits: Secondary | ICD-10-CM | POA: Insufficient documentation

## 2023-04-02 DIAGNOSIS — Z8719 Personal history of other diseases of the digestive system: Secondary | ICD-10-CM | POA: Insufficient documentation

## 2023-04-02 DIAGNOSIS — Z7984 Long term (current) use of oral hypoglycemic drugs: Secondary | ICD-10-CM | POA: Diagnosis not present

## 2023-04-02 DIAGNOSIS — Z952 Presence of prosthetic heart valve: Secondary | ICD-10-CM

## 2023-04-02 DIAGNOSIS — I2510 Atherosclerotic heart disease of native coronary artery without angina pectoris: Secondary | ICD-10-CM | POA: Diagnosis not present

## 2023-04-02 DIAGNOSIS — I5022 Chronic systolic (congestive) heart failure: Secondary | ICD-10-CM | POA: Insufficient documentation

## 2023-04-02 DIAGNOSIS — I350 Nonrheumatic aortic (valve) stenosis: Secondary | ICD-10-CM | POA: Insufficient documentation

## 2023-04-02 DIAGNOSIS — I110 Hypertensive heart disease with heart failure: Secondary | ICD-10-CM | POA: Diagnosis not present

## 2023-04-02 DIAGNOSIS — Z79899 Other long term (current) drug therapy: Secondary | ICD-10-CM | POA: Insufficient documentation

## 2023-04-02 DIAGNOSIS — J45909 Unspecified asthma, uncomplicated: Secondary | ICD-10-CM | POA: Insufficient documentation

## 2023-04-02 DIAGNOSIS — C029 Malignant neoplasm of tongue, unspecified: Secondary | ICD-10-CM | POA: Diagnosis not present

## 2023-04-02 DIAGNOSIS — E1140 Type 2 diabetes mellitus with diabetic neuropathy, unspecified: Secondary | ICD-10-CM | POA: Diagnosis not present

## 2023-04-02 MED ORDER — SUCRALFATE 1 G PO TABS: 0.5000 g | ORAL_TABLET | Freq: Three times a day (TID) | ORAL | 1 refills | Status: DC

## 2023-04-02 NOTE — Progress Notes (Signed)
Cardiac Individual Treatment Plan  Patient Details  Name: Sean Hayden MRN: [id number] Date of Birth: [birth year] Referring Provider:   Flowsheet Row Cardiac Rehab from 01/13/2023 in [facility] Cardiac and Pulmonary Rehab  Referring Provider Mila Merry MD       Initial Encounter Date:  Flowsheet Row Cardiac Rehab from 01/13/2023 in [facility] Cardiac and Pulmonary Rehab  Date 10/24/22       Visit Diagnosis: S/P TAVR (transcatheter aortic valve replacement)  Patient's Home Medications on Admission:  Current Outpatient Medications:    acyclovir (ZOVIRAX) 400 MG tablet, 1/2 tablet up to 5 times a day for cold sores (Patient taking differently: as needed. 1/2 tablet up to 5 times a day for cold sores), Disp: 30 tablet, Rfl: 5   albuterol (VENTOLIN HFA) 108 (90 Base) MCG/ACT inhaler, Inhale 2 puffs into the lungs every 6 (six) hours as needed for wheezing or shortness of breath., Disp: , Rfl:    atorvastatin (LIPITOR) 20 MG tablet, Take 1 tablet (20 mg total) by mouth every evening. (Patient taking differently: Take 40 mg by mouth every evening.), Disp: 90 tablet, Rfl: 3   benzocaine (ORAJEL) 10 % mucosal gel, Use as directed 1 application in the mouth or throat as needed for mouth pain., Disp: , Rfl:    Cholecalciferol (VITAMIN D PO), Take 2,000 Units by mouth daily. , Disp: , Rfl:    Docosanol 10 % CREA, Apply topically up to 5 times per day as needed until heals, Disp: 2 g, Rfl: 3   empagliflozin (JARDIANCE) 25 MG TABS tablet, Take 25 mg by mouth daily., Disp: 90 tablet, Rfl: 4   Flaxseed, Linseed, (FLAXSEED OIL) 1000 MG CAPS, Take 1,000 mg by mouth 2 (two) times daily., Disp: , Rfl:    fluticasone (FLONASE) 50 MCG/ACT nasal spray, Place 2 sprays into both nostrils daily. (Patient taking differently: Place 2 sprays into both nostrils as needed.), Disp: 16 g, Rfl: 1   furosemide (LASIX) 20 MG tablet, TAKE ONE (1) TABLET BY MOUTH ONCE DAILY AS NEEDED FOR EDEMA (TAKE 1 TABLET DAILYAS NEEDED FOR  WEIGHT GAIN > 2 POUNDS IN 24 HOURS), Disp: 30 tablet, Rfl: 1   gabapentin (NEURONTIN) 100 MG capsule, Take 1 capsule (100 mg total) by mouth 2 (two) times daily., Disp: 180 capsule, Rfl: 1   HYDROcodone-acetaminophen (NORCO/VICODIN) 5-325 MG tablet, Take 1 tablet by mouth every 4 (four) hours as needed for moderate pain., Disp: 12 tablet, Rfl: 0   insulin detemir (LEVEMIR) 100 UNIT/ML injection, Inject 0.3 mLs (30 Units total) into the skin 2 (two) times daily. (Patient taking differently: Inject 60 Units into the skin 2 (two) times daily.), Disp: 100 mL, Rfl: 1   levETIRAcetam (KEPPRA) 750 MG tablet, Take 750 mg by mouth 2 (two) times daily., Disp: , Rfl:    lisinopril (ZESTRIL) 2.5 MG tablet, Take 5 mg by mouth daily., Disp: , Rfl:    loratadine (CLARITIN) 10 MG tablet, Take 10 mg by mouth daily., Disp: , Rfl:    metFORMIN (GLUCOPHAGE) 850 MG tablet, Take 1 tablet (850 mg total) by mouth 2 (two) times daily with a meal., Disp: 180 tablet, Rfl: 1   metoprolol tartrate (LOPRESSOR) 25 MG tablet, Take 0.5 tablets by mouth every 12 (twelve) hours., Disp: , Rfl:    Misc Natural Products (OSTEO BI-FLEX TRIPLE STRENGTH PO), Take 1 tablet by mouth 2 (two) times daily. , Disp: , Rfl:    MISC NATURAL PRODUCTS PO, Take 1 capsule by mouth in the morning  and at bedtime. Super Beta Prostate, Disp: , Rfl:    Omega-3 Fatty Acids (FISH OIL) 1000 MG CPDR, Take 1,200 mg by mouth 2 (two) times daily. , Disp: , Rfl:    PARoxetine (PAXIL) 40 MG tablet, TAKE 1 TABLET BY MOUTH DAILY., Disp: 30 tablet, Rfl: 0   rivaroxaban (XARELTO) 20 MG TABS tablet, TAKE ONE TABLET BY MOUTH DAILY WITH SUPPER, Disp: 30 tablet, Rfl: 12  Past Medical History: Past Medical History:  Diagnosis Date   Aortic stenosis    a.) TTE 02/22/2016: moderate - severe AS (MPG 17); b.) s/p TAVR 09/09/2022: 26 mm Edwards Sapien 3 Ultra Resilia   Asthma    B12 deficiency    Carotid stenosis, bilateral    a.) carotid doppler 03/10/2012: 75-95% RICA, <  50 LICA; b.) s/p RIGHT CEA 04/17/2012 with CorMatrix patch angioplasty   Coronary artery disease    Gout    Heart murmur    Hemorrhoids    HFrEF (heart failure with reduced ejection fraction)    a.) TTE 02/22/2016: EF 60-65%, mild LV dil, mod RA dil, mod-sev AS, G1DD; b.) TTE 09/10/2022: EF 40%, glob HK, sep AK, mild BAE, mild TR, mod MR, pros AoV (MPG 3)   History of kidney stones    Hyperlipidemia    Hyperplastic colon polyp    Hypertension    Insomnia    Ischemic cerebrovascular accident (CVA) 02/21/2016   a.) presented with LEFT sided weakness; b.) MRI brain 02/21/2016: acute infarct in the right medial parietal lobe measuring 5 mm   Long term current use of anticoagulant    a.) rivaroxaban   Neuropathy    Nonischemic congestive cardiomyopathy    a.) TTE 02/22/2016: EF 60-65%; b.) TTE 09/10/2022: EF 40%   Nose colonized with MRSA 02/21/2016   a.) surgical PCR (+) 02/21/2016 prior to ILR implant   NSVT (nonsustained ventricular tachycardia)    a) holter 09/10/2022: longest episode 23 beats, and fastest episode 164 bpm   PAF (paroxysmal atrial fibrillation)    a.) CHA2DS2VASc = 8 (age x2, HFrEF, HTN, CVAx2, vascular disease history, T2DM);  b.) s/p ILR implant 02/23/2016; c.) rate/rhythm maintained on oral metoprolol tartrate; chronically anticoagulated with rivaroxaban   Pneumonia    S/P TAVR (transcatheter aortic valve replacement) 09/09/2022   a.) 26 mm Edwards Sapien 3 Ultra Resilia   Seizures    Skin cancer of face    T2DM (type 2 diabetes mellitus)    Tongue neoplasm     Tobacco Use: Social History   Tobacco Use  Smoking Status Former   Types: Cigars   Quit date: 04/08/2012   Years since quitting: 10.9  Smokeless Tobacco Never  Tobacco Comments   cigarettes and cigars  all quit 12 years ago    Labs: Review Flowsheet  More data exists      Latest Ref Rng & Units 02/07/2020 05/16/2020 07/18/2021 01/25/2022 05/20/2022  Labs for ITP Cardiac and Pulmonary Rehab   Cholestrol 100 - 199 mg/dL 161     - 096  045  -  LDL (calc) 0 - 99 mg/dL 49     - 51  45  -  HDL-C >39 mg/dL 37     - 34  33  -  Trlycerides 0 - 149 mg/dL 409     - 811  914  -  Hemoglobin A1c 4.0 - 5.6 % 7.7     7.6  8.9  8.5  7.6     Details  This result is from an external source.          Exercise Target Goals: Exercise Program Goal: Individual exercise prescription set using results from initial 6 min walk test and THRR while considering  patient's activity barriers and safety.   Exercise Prescription Goal: Initial exercise prescription builds to 30-45 minutes a day of aerobic activity, 2-3 days per week.  Home exercise guidelines will be given to patient during program as part of exercise prescription that the participant will acknowledge.   Education: Aerobic Exercise: - Group verbal and visual presentation on the components of exercise prescription. Introduces F.I.T.T principle from ACSM for exercise prescriptions.  Reviews F.I.T.T. principles of aerobic exercise including progression. Written material given at graduation. Flowsheet Row Cardiac Rehab from 10/24/2022 in [facility] Cardiac and Pulmonary Rehab  Education need identified 10/24/22       Education: Resistance Exercise: - Group verbal and visual presentation on the components of exercise prescription. Introduces F.I.T.T principle from ACSM for exercise prescriptions  Reviews F.I.T.T. principles of resistance exercise including progression. Written material given at graduation.    Education: Exercise & Equipment Safety: - Individual verbal instruction and demonstration of equipment use and safety with use of the equipment. Flowsheet Row Cardiac Rehab from 10/24/2022 in [facility] Cardiac and Pulmonary Rehab  Education need identified 10/24/22  Date 10/24/22  Educator KL  Instruction Review Code 1- Verbalizes Understanding       Education: Exercise Physiology & General Exercise Guidelines: - Group verbal  and written instruction with models to review the exercise physiology of the cardiovascular system and associated critical values. Provides general exercise guidelines with specific guidelines to those with heart or lung disease.  Flowsheet Row Cardiac Rehab from 10/24/2022 in [facility] Cardiac and Pulmonary Rehab  Education need identified 10/24/22       Education: Flexibility, Balance, Mind/Body Relaxation: - Group verbal and visual presentation with interactive activity on the components of exercise prescription. Introduces F.I.T.T principle from ACSM for exercise prescriptions. Reviews F.I.T.T. principles of flexibility and balance exercise training including progression. Also discusses the mind body connection.  Reviews various relaxation techniques to help reduce and manage stress (i.e. Deep breathing, progressive muscle relaxation, and visualization). Balance handout provided to take home. Written material given at graduation.   Activity Barriers & Risk Stratification:   6 Minute Walk:  6 Minute Walk     Row Name 01/13/23 1644         6 Minute Walk   Phase Initial     Distance 657 feet     Walk Time 6 minutes     # of Rest Breaks 0     MPH 1.24     METS 1.43     RPE 13     Perceived Dyspnea  0     VO2 Peak 5     Symptoms Yes (comment)     Comments Legs fatigued     Resting HR 102 bpm     Resting BP 104/58     Resting Oxygen Saturation  96 %     Exercise Oxygen Saturation  during 6 min walk 95 %     Max Ex. HR 156 bpm     Max Ex. BP 128/62     2 Minute Post BP 108/64              Oxygen Initial Assessment:   Oxygen Re-Evaluation:   Oxygen Discharge (Final Oxygen Re-Evaluation):   Initial Exercise Prescription:  Initial Exercise Prescription - 01/13/23  1600       Date of Initial Exercise RX and Referring Provider   Date 10/24/22    Referring Provider Mila Merry MD      Oxygen   Maintain Oxygen Saturation 88% or higher      Recumbant Bike   Level  1    RPM 60    Watts 10    Minutes 15    METs 1      NuStep   Level 1    SPM 80    Minutes 15    METs 1      T5 Nustep   Level 1    SPM 80    Minutes 15    METs 1      Track   Laps 8    Minutes 15    METs 1.44      Prescription Details   Frequency (times per week) 2    Duration Progress to 30 minutes of continuous aerobic without signs/symptoms of physical distress      Intensity   THRR 40-80% of Max Heartrate 118-134    Ratings of Perceived Exertion 11-13    Perceived Dyspnea 0-4      Progression   Progression Continue to progress workloads to maintain intensity without signs/symptoms of physical distress.      Resistance Training   Training Prescription Yes    Weight 2 lb    Reps 10-15             Perform Capillary Blood Glucose checks as needed.  Exercise Prescription Changes:   Exercise Prescription Changes     Row Name 01/13/23 1600 01/28/23 1500 02/12/23 1500 02/25/23 1300 03/13/23 0900     Response to Exercise   Blood Pressure (Admit) 104/58 104/56 98/56 106/60 122/66   Blood Pressure (Exercise) 128/62 132/64 114/62 130/60 130/60   Blood Pressure (Exit) 108/64 110/58 90/54 102/60 104/60   Heart Rate (Admit) 102 bpm 70 bpm 80 bpm 85 bpm 82 bpm   Heart Rate (Exercise) 156 bpm 103 bpm 103 bpm 108 bpm 102 bpm   Heart Rate (Exit) 98 bpm 81 bpm 75 bpm 86 bpm 91 bpm   Oxygen Saturation (Admit) 96 % -- -- -- --   Oxygen Saturation (Exercise) 95 % -- -- -- --   Rating of Perceived Exertion (Exercise) 13 15 15 15 15    Perceived Dyspnea (Exercise) 0 -- -- -- --   Symptoms Legs fatigued none none none none   Comments Results 1st week back after being out since December -- return after fall --   Duration -- Progress to 30 minutes of  aerobic without signs/symptoms of physical distress Progress to 30 minutes of  aerobic without signs/symptoms of physical distress Continue with 30 min of aerobic exercise without signs/symptoms of physical distress.  Continue with 30 min of aerobic exercise without signs/symptoms of physical distress.   Intensity -- THRR unchanged THRR unchanged THRR unchanged THRR unchanged     Progression   Progression -- Continue to progress workloads to maintain intensity without signs/symptoms of physical distress. Continue to progress workloads to maintain intensity without signs/symptoms of physical distress. Continue to progress workloads to maintain intensity without signs/symptoms of physical distress. Continue to progress workloads to maintain intensity without signs/symptoms of physical distress.   Average METs -- 1.78 1.75 1.75 1.95     Resistance Training   Training Prescription -- Yes Yes Yes Yes   Weight -- 3 lb 3 lb 3 lb 3  lb   Reps -- 10-15 10-15 10-15 10-15     Interval Training   Interval Training -- -- No No No     NuStep   Level -- -- -- -- 2   Minutes -- -- -- -- 15   METs -- -- -- -- 2.3     REL-XR   Level -- -- 1 -- --   Minutes -- -- 15 -- --   METs -- -- 2 -- --     T5 Nustep   Level -- 2 -- 2 2   Minutes -- 15 -- 15 15   METs -- 2 -- 2 1.9     Track   Laps -- 16 9 -- 24   Minutes -- 15 15 -- 15   METs -- 1.87 1.49 -- 2.31     Oxygen   Maintain Oxygen Saturation -- 88% or higher 88% or higher 88% or higher 88% or higher    Row Name 03/17/23 1600 03/26/23 1000           Response to Exercise   Blood Pressure (Admit) -- 96/58      Blood Pressure (Exit) -- 104/62      Heart Rate (Admit) -- 75 bpm      Heart Rate (Exercise) -- 96 bpm      Heart Rate (Exit) -- 89 bpm      Rating of Perceived Exertion (Exercise) -- 15      Symptoms -- none      Duration -- Continue with 30 min of aerobic exercise without signs/symptoms of physical distress.      Intensity -- THRR unchanged        Progression   Progression -- Continue to progress workloads to maintain intensity without signs/symptoms of physical distress.      Average METs -- 2.3        Resistance Training   Training  Prescription -- Yes      Weight -- 3 lb      Reps -- 10-15        Interval Training   Interval Training -- No        NuStep   Level -- 2      Minutes -- 30      METs -- 2.7        T5 Nustep   Level -- 2      Minutes -- 30      METs -- 1.9        Home Exercise Plan   Plans to continue exercise at Home (comment)  seated stepper, leg ergometer, youtube staff videos Home (comment)  seated stepper, leg ergometer, youtube staff videos      Frequency Add 2 additional days to program exercise sessions. Add 2 additional days to program exercise sessions.      Initial Home Exercises Provided 03/17/23 03/17/23        Oxygen   Maintain Oxygen Saturation 88% or higher 88% or higher               Exercise Comments:   Exercise Goals and Review:   Exercise Goals Re-Evaluation :  Exercise Goals Re-Evaluation     Row Name 01/28/23 1534 02/12/23 1532 02/25/23 1330 03/12/23 1606 03/13/23 1002     Exercise Goal Re-Evaluation   Exercise Goals Review Increase Physical Activity;Increase Strength and Stamina;Understanding of Exercise Prescription Increase Physical Activity;Increase Strength and Stamina;Understanding of Exercise Prescription Increase Physical Activity;Increase Strength and Stamina;Understanding of Exercise Prescription  Increase Physical Activity;Increase Strength and Stamina;Understanding of Exercise Prescription Increase Physical Activity;Increase Strength and Stamina;Understanding of Exercise Prescription   Comments Cesario has returned back to rehab after being out since December after a hospital admission. Upon return, he was able to walk 16 laps on the track his first day back. He has also increased to level 2 on the T5 Nustep and now using 3 lb for handweights. We will continue to monitor as he continues to progress and come consistently. Tylor has only attended rehab once since the last review. He was only able to do 9 laps on the track, where he was previously doing 16. He  did do well with his first time on the XR at level 1. He also has continued to use 3 lb hand weights for resistance training. We will continue to monitor his progress in the program. Jorden was out for a little while as he had a fall. He has returned and exercised 2 sessions as he is easing back into it. He exercised on level 2 on the T5 Nustep. Will slowly introduce walking back into his regimen when he feels ready. Will continue to monitor. Zayveon is going to exercise at home when he is done with the program. He fell not to long ago and has not been able to get walking since. His  fall was about three or four weeks ago. His wife has a T4 NuStep at home for him to use. Tamar is doing well in the program. He recently increased his overall average MET level to 1.95 METs. He also improved to level 2 on the T4 nustep. He was also able to increase his laps on the track from 16 to 24 laps. We will continue to monitor his progress in the program.   Expected Outcomes Short: Maintain good attendance Long: Increase overall strength and stamina Short: Attend rehab regularly, increase laps on the track. Long: Continue to improve strength and stamina. Short: ease back into exercise slowly Long: Continue to increase overall MET level and strength Short: start exercise at home. Long: maintain exercise at home independently. Short: Continue to push for more laps on the track. Long: Continue to improve strength and stamina.    Row Name 03/17/23 1611 03/26/23 1038           Exercise Goal Re-Evaluation   Exercise Goals Review Increase Physical Activity;Increase Strength and Stamina;Understanding of Exercise Prescription Increase Physical Activity;Increase Strength and Stamina;Understanding of Exercise Prescription      Comments Reviewed home exercise with pt today.  Pt plans to work on his seated stepper (he is not sure what it is called), leg ergometer, and youtube staff videos (seated primarily) for exercise.  Encouraged  patient to stay compliant with walker when moving around. There are some balance concerns with patient and he is aware to be safe. Will be more safe to complete seated exercises, encouraged to some walking here at rehab to monitor for now. Reviewed THR, pulse, RPE, sign and symptoms, pulse oximetery and when to call 911 or MD.  Also discussed weather considerations and indoor options.  Pt voiced understanding. Montrelle continues to do well in rehab. He has not walked as much and has completed 30 minutes of his seated exercises. We will encourage patient to incorporate more walking as he is not quite hitting his THR. He could also benefit from increasing is T4 Nustep to level 3. We will continue to monitor.      Expected Outcomes Short: Add on 1  day of exercise, slowly build up to 30 minutes as tolerated Short: Incorporate track walking, increase to level 3 on the T4 Long: Continue to increase overall MET level and stamina               Discharge Exercise Prescription (Final Exercise Prescription Changes):  Exercise Prescription Changes - 03/26/23 1000       Response to Exercise   Blood Pressure (Admit) 96/58    Blood Pressure (Exit) 104/62    Heart Rate (Admit) 75 bpm    Heart Rate (Exercise) 96 bpm    Heart Rate (Exit) 89 bpm    Rating of Perceived Exertion (Exercise) 15    Symptoms none    Duration Continue with 30 min of aerobic exercise without signs/symptoms of physical distress.    Intensity THRR unchanged      Progression   Progression Continue to progress workloads to maintain intensity without signs/symptoms of physical distress.    Average METs 2.3      Resistance Training   Training Prescription Yes    Weight 3 lb    Reps 10-15      Interval Training   Interval Training No      NuStep   Level 2    Minutes 30    METs 2.7      T5 Nustep   Level 2    Minutes 30    METs 1.9      Home Exercise Plan   Plans to continue exercise at Home (comment)   seated stepper, leg  ergometer, youtube staff videos   Frequency Add 2 additional days to program exercise sessions.    Initial Home Exercises Provided 03/17/23      Oxygen   Maintain Oxygen Saturation 88% or higher             Nutrition:  Target Goals: Understanding of nutrition guidelines, daily intake of sodium 1500mg , cholesterol 200mg , calories 30% from fat and 7% or less from saturated fats, daily to have 5 or more servings of fruits and vegetables.  Education: All About Nutrition: -Group instruction provided by verbal, written material, interactive activities, discussions, models, and posters to present general guidelines for heart healthy nutrition including fat, fiber, MyPlate, the role of sodium in heart healthy nutrition, utilization of the nutrition label, and utilization of this knowledge for meal planning. Follow up email sent as well. Written material given at graduation. Flowsheet Row Cardiac Rehab from 10/24/2022 in [facility] Cardiac and Pulmonary Rehab  Education need identified 10/24/22       Biometrics:  Pre Biometrics - 01/13/23 1646       Pre Biometrics   Height 5' 6.5" (1.689 m)    Weight 224 lb 8 oz (101.8 kg)    BMI (Calculated) 35.7    Single Leg Stand 0 seconds              Nutrition Therapy Plan and Nutrition Goals:  Nutrition Therapy & Goals - 03/17/23 1614       Nutrition Therapy   RD appointment deferred Yes   Patient would not like to meet with RD at this time.            Nutrition Assessments:  MEDIFICTS Score Key: ?70 Need to make dietary changes  40-70 Heart Healthy Diet ? 40 Therapeutic Level Cholesterol Diet  Flowsheet Row Cardiac Rehab from 10/24/2022 in [facility] Cardiac and Pulmonary Rehab  Picture Your Plate Total Score on Admission 51      Picture  Your Plate Scores: <62 Unhealthy dietary pattern with much room for improvement. 41-50 Dietary pattern unlikely to meet recommendations for good health and room for improvement. 51-60 More  healthful dietary pattern, with some room for improvement.  >60 Healthy dietary pattern, although there may be some specific behaviors that could be improved.    Nutrition Goals Re-Evaluation:  Nutrition Goals Re-Evaluation     Row Name 03/12/23 1612             Goals   Current Weight 232 lb (105.2 kg)       Comment Kareen defers RD appointment.                Nutrition Goals Discharge (Final Nutrition Goals Re-Evaluation):  Nutrition Goals Re-Evaluation - 03/12/23 1612       Goals   Current Weight 232 lb (105.2 kg)    Comment Jonuel defers RD appointment.             Psychosocial: Target Goals: Acknowledge presence or absence of significant depression and/or stress, maximize coping skills, provide positive support system. Participant is able to verbalize types and ability to use techniques and skills needed for reducing stress and depression.   Education: Stress, Anxiety, and Depression - Group verbal and visual presentation to define topics covered.  Reviews how body is impacted by stress, anxiety, and depression.  Also discusses healthy ways to reduce stress and to treat/manage anxiety and depression.  Written material given at graduation.   Education: Sleep Hygiene -Provides group verbal and written instruction about how sleep can affect your health.  Define sleep hygiene, discuss sleep cycles and impact of sleep habits. Review good sleep hygiene tips.    Initial Review & Psychosocial Screening:   Quality of Life Scores:   Scores of 19 and below usually indicate a poorer quality of life in these areas.  A difference of  2-3 points is a clinically meaningful difference.  A difference of 2-3 points in the total score of the Quality of Life Index has been associated with significant improvement in overall quality of life, self-image, physical symptoms, and general health in studies assessing change in quality of life.  PHQ-9: Review Flowsheet  More data exists       01/06/2023 10/24/2022 01/03/2022 07/18/2021 12/28/2020  Depression screen PHQ 2/9  Decreased Interest 0 0 0 2 0  Down, Depressed, Hopeless 0 1 0 1 0  PHQ - 2 Score 0 1 0 3 0  Altered sleeping 0 0 - 3 -  Tired, decreased energy 0 1 - 3 -  Change in appetite 0 1 - 3 -  Feeling bad or failure about yourself  0 0 - 0 -  Trouble concentrating 0 1 - 0 -  Moving slowly or fidgety/restless 0 0 - 0 -  Suicidal thoughts 0 0 - 0 -  PHQ-9 Score 0 4 - 12 -  Difficult doing work/chores Not difficult at all Somewhat difficult - Not difficult at all -   Interpretation of Total Score  Total Score Depression Severity:  1-4 = Minimal depression, 5-9 = Mild depression, 10-14 = Moderate depression, 15-19 = Moderately severe depression, 20-27 = Severe depression   Psychosocial Evaluation and Intervention:  Psychosocial Evaluation - 03/17/23 1614       Psychosocial Evaluation & Interventions   Interventions Encouraged to exercise with the program and follow exercise prescription    Comments Enjoying doing mow lawn, starting to ask someone else to do it if it gets too  much.    Continue Psychosocial Services  Follow up required by staff             Psychosocial Re-Evaluation:  Psychosocial Re-Evaluation     Row Name 03/12/23 1609 03/17/23 1625           Psychosocial Re-Evaluation   Current issues with Current Sleep Concerns --      Comments Donaldson has not been able to sleep well ever since his fall. He states he stopped taking his ambien since it made his sugar high. He does not want to continue to take any sleeping aids. --      Expected Outcomes Short: work on sleeping patterns. Long: maintain adequate sleep. --      Interventions Encouraged to attend Cardiac Rehabilitation for the exercise --      Continue Psychosocial Services  Follow up required by staff --               Psychosocial Discharge (Final Psychosocial Re-Evaluation):  Psychosocial Re-Evaluation - 03/17/23 1625        Psychosocial Re-Evaluation   Current issues with --    Comments --    Interventions --    Continue Psychosocial Services  --             Vocational Rehabilitation: Provide vocational rehab assistance to qualifying candidates.   Vocational Rehab Evaluation & Intervention:   Education: Education Goals: Education classes will be provided on a variety of topics geared toward better understanding of heart health and risk factor modification. Participant will state understanding/return demonstration of topics presented as noted by education test scores.  Learning Barriers/Preferences:   General Cardiac Education Topics:  AED/CPR: - Group verbal and written instruction with the use of models to demonstrate the basic use of the AED with the basic ABC's of resuscitation.   Anatomy and Cardiac Procedures: - Group verbal and visual presentation and models provide information about basic cardiac anatomy and function. Reviews the testing methods done to diagnose heart disease and the outcomes of the test results. Describes the treatment choices: Medical Management, Angioplasty, or Coronary Bypass Surgery for treating various heart conditions including Myocardial Infarction, Angina, Valve Disease, and Cardiac Arrhythmias.  Written material given at graduation. Flowsheet Row Cardiac Rehab from 10/24/2022 in [facility] Cardiac and Pulmonary Rehab  Education need identified 10/24/22       Medication Safety: - Group verbal and visual instruction to review commonly prescribed medications for heart and lung disease. Reviews the medication, class of the drug, and side effects. Includes the steps to properly store meds and maintain the prescription regimen.  Written material given at graduation.   Intimacy: - Group verbal instruction through game format to discuss how heart and lung disease can affect sexual intimacy. Written material given at graduation..   Know Your Numbers and Heart  Failure: - Group verbal and visual instruction to discuss disease risk factors for cardiac and pulmonary disease and treatment options.  Reviews associated critical values for Overweight/Obesity, Hypertension, Cholesterol, and Diabetes.  Discusses basics of heart failure: signs/symptoms and treatments.  Introduces Heart Failure Zone chart for action plan for heart failure.  Written material given at graduation.   Infection Prevention: - Provides verbal and written material to individual with discussion of infection control including proper hand washing and proper equipment cleaning during exercise session. Flowsheet Row Cardiac Rehab from 10/24/2022 in [facility] Cardiac and Pulmonary Rehab  Education need identified 10/24/22  Date 10/24/22  Educator KL  Instruction Review Code 1- [employer]  Understanding       Falls Prevention: - Provides verbal and written material to individual with discussion of falls prevention and safety. Flowsheet Row Cardiac Rehab from 10/16/2022 in [facility] Cardiac and Pulmonary Rehab  Date 10/16/22  Educator SB  Instruction Review Code 1- Verbalizes Understanding       Other: -Provides group and verbal instruction on various topics (see comments)   Knowledge Questionnaire Score:   Core Components/Risk Factors/Patient Goals at Admission:  Personal Goals and Risk Factors at Admission - 01/13/23 1653       Core Components/Risk Factors/Patient Goals on Admission    Weight Management Yes;Weight Loss;Obesity    Intervention Weight Management: Develop a combined nutrition and exercise program designed to reach desired caloric intake, while maintaining appropriate intake of nutrient and fiber, sodium and fats, and appropriate energy expenditure required for the weight goal.;Weight Management/Obesity: Establish reasonable short term and long term weight goals.;Weight Management: Provide education and appropriate resources to help participant work on and attain dietary  goals.    Admit Weight 224 lb 8 oz (101.8 kg)    Goal Weight: Long Term 200 lb (90.7 kg)    Expected Outcomes Short Term: Continue to assess and modify interventions until short term weight is achieved;Long Term: Adherence to nutrition and physical activity/exercise program aimed toward attainment of established weight goal;Weight Loss: Understanding of general recommendations for a balanced deficit meal plan, which promotes 1-2 lb weight loss per week and includes a negative energy balance of [phone] kcal/d;Understanding recommendations for meals to include 15-35% energy as protein, 25-35% energy from fat, 35-60% energy from carbohydrates, less than 200mg  of dietary cholesterol, 20-35 gm of total fiber daily;Understanding of distribution of calorie intake throughout the day with the consumption of 4-5 meals/snacks    Diabetes Yes    Intervention Provide education about signs/symptoms and action to take for hypo/hyperglycemia.;Provide education about proper nutrition, including hydration, and aerobic/resistive exercise prescription along with prescribed medications to achieve blood glucose in normal ranges: Fasting glucose 65-99 mg/dL    Expected Outcomes Short Term: Participant verbalizes understanding of the signs/symptoms and immediate care of hyper/hypoglycemia, proper foot care and importance of medication, aerobic/resistive exercise and nutrition plan for blood glucose control.;Long Term: Attainment of HbA1C < 7%.    Heart Failure Yes    Intervention Provide a combined exercise and nutrition program that is supplemented with education, support and counseling about heart failure. Directed toward relieving symptoms such as shortness of breath, decreased exercise tolerance, and extremity edema.    Expected Outcomes Improve functional capacity of life;Short term: Attendance in program 2-3 days a week with increased exercise capacity. Reported lower sodium intake. Reported increased fruit and vegetable  intake. Reports medication compliance.;Short term: Daily weights obtained and reported for increase. Utilizing diuretic protocols set by physician.;Long term: Adoption of self-care skills and reduction of barriers for early signs and symptoms recognition and intervention leading to self-care maintenance.    Hypertension Yes    Intervention Provide education on lifestyle modifcations including regular physical activity/exercise, weight management, moderate sodium restriction and increased consumption of fresh fruit, vegetables, and low fat dairy, alcohol moderation, and smoking cessation.;Monitor prescription use compliance.    Expected Outcomes Short Term: Continued assessment and intervention until BP is < 140/18mm HG in hypertensive participants. < 130/62mm HG in hypertensive participants with diabetes, heart failure or chronic kidney disease.;Long Term: Maintenance of blood pressure at goal levels.    Lipids Yes    Intervention Provide education and support for participant on nutrition &  aerobic/resistive exercise along with prescribed medications to achieve LDL 70mg , HDL >40mg .    Expected Outcomes Short Term: Participant states understanding of desired cholesterol values and is compliant with medications prescribed. Participant is following exercise prescription and nutrition guidelines.;Long Term: Cholesterol controlled with medications as prescribed, with individualized exercise RX and with personalized nutrition plan. Value goals: LDL < , HDL > 40 mg.             Education:Diabetes - Individual verbal and written instruction to review signs/symptoms of diabetes, desired ranges of glucose level fasting, after meals and with exercise. Acknowledge that pre and post exercise glucose checks will be done for 3 sessions at entry of program. Flowsheet Row Cardiac Rehab from 10/24/2022 in [facility] Cardiac and Pulmonary Rehab  Education need identified 10/24/22  Date 10/24/22  Educator KL   Instruction Review Code 1- Verbalizes Understanding       Core Components/Risk Factors/Patient Goals Review:   Goals and Risk Factor Review     Row Name 03/12/23 1613 03/17/23 1616           Core Components/Risk Factors/Patient Goals Review   Personal Goals Review Weight Management/Obesity;Hypertension Weight Management/Obesity;Hypertension;Diabetes      Review Venkat has been taking lasix and trying to manage his weight. He wants to keep his weight around 230 pounds. Joelle is checking his blood pressure at home. He has surgery on his tongue April 12th. He is to keep staff informed if he is going to be able to come to rehab. Patient states he is checking his blood sugar daily, it usually ranges between 120-130 fasting. He knows to notify MD of any abnormal value between BG, weight, and/or BP.      Expected Outcomes Short: check blood pressures at home. Long: maintain blood pressures independently. Short: Continue to watch sugars Long: Continue to manage all lifestyle risk factors               Core Components/Risk Factors/Patient Goals at Discharge (Final Review):   Goals and Risk Factor Review - 03/17/23 1616       Core Components/Risk Factors/Patient Goals Review   Personal Goals Review Weight Management/Obesity;Hypertension;Diabetes    Review Patient states he is checking his blood sugar daily, it usually ranges between 120-130 fasting. He knows to notify MD of any abnormal value between BG, weight, and/or BP.    Expected Outcomes Short: Continue to watch sugars Long: Continue to manage all lifestyle risk factors             ITP Comments:  ITP Comments     Row Name 01/13/23 1638 01/29/23 1222 02/12/23 1201 02/26/23 0808 03/26/23 0830   ITP Comments Patient returned to cardiac rehab after insurance change.  6 minute walk test performed and updated ITP created. 30 day review completed. ITP sent to Dr. Bethann Punches, Medical Director of Cardiac Rehab. Continue with ITP  unless changes are made by physician. Spoke with patient- states he has been trying to recover from his fall- caused by tripping. Hasn't been to rehab since 2/21. Feels sore but was told he did not break anything. Sees his doctor tomorrow for a follow up and will ask if he has any limitations. Due to come back to rehab on 3/11 unless told otherwise. 30 Day review completed. Medical Director ITP review done, changes made as directed, and signed approval by Medical Director.    returned 30 day review completed. ITP sent to Dr. Bethann Punches, Medical Director of Cardiac Rehab. Continue with  ITP unless changes are made by physician.    Row Name 04/02/23 1316           ITP Comments Patient called to let us know he would like to discharge from rehab at this time as he just got results back on potential tongue cancer and does not feel he can commit to rehab right now. Patient states he is going to start treatment soon. Patient will reach out to the [place] if and when he wants to come back to see if a new referral would be appropriate. Also briefly discussed the CARE program as an option.                Comments: Discharge ITP

## 2023-04-02 NOTE — Telephone Encounter (Signed)
Left message asking for call back regarding cardiac rehab. Has not attended since 4/10.

## 2023-04-02 NOTE — Consult Note (Signed)
NEW PATIENT EVALUATION  Name: Sean Hayden  MRN: [id number]  Date:   04/02/2023     DOB: Jul 12, 1946   This 77 y.o. 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   PREVIOUS INVESTIGATIONS:  PET CT scan ordered CT scan of maxillofacial cervical spine and head reviewed Pathology reports reviewed Clinical notes reviewed   HPI: Patient is a 77 year old male who has had 2 previous surgeries for squamous of carcinoma of his tongue.  He was recently noted by Dr. Alvino Chapel to have ulcerations Colvin scarring involving the anterior left side of his tongue.  He was taken to surgery and had this excised.  This lesion was in close proximity to the submandibular duct and that was spared.  Pathology showed moderately differentiated squamous cell carcinoma 2 cm lesion with focally extending to the deep and unoriented lateral margins.  Perineural invasion was present.  Tumor was grade 2.  No lymph nodes were sampled.  He is seen today for evaluation by radiation oncology.  I have spoken to the surgeon and feels further surgery would be quite morbid for the patient.  He is doing fairly well specifically denies head or neck pain or dysphagia.  PLANNED TREATMENT REGIMEN: External beam radiation therapy IMRT  PAST MEDICAL HISTORY:  has a past medical history of Aortic stenosis, Asthma, B12 deficiency, Carotid stenosis, bilateral, Coronary artery disease, Gout, Heart murmur, Hemorrhoids, HFrEF (heart failure with reduced ejection fraction), History of kidney stones, Hyperlipidemia, Hyperplastic colon polyp, Hypertension, Insomnia, Ischemic cerebrovascular accident (CVA)  (02/21/2016), Long term current use of anticoagulant, Neuropathy, Nonischemic congestive cardiomyopathy, Nose colonized with MRSA (02/21/2016), NSVT (nonsustained ventricular tachycardia), PAF (paroxysmal atrial fibrillation), Pneumonia, S/P TAVR (transcatheter aortic valve replacement) (09/09/2022), Seizures, Skin cancer of face, T2DM (type 2 diabetes mellitus), and Tongue neoplasm.    PAST SURGICAL HISTORY:  Past Surgical History:  Procedure Laterality Date   Arm fracture Left    CAROTID ARTERY ANGIOPLASTY Right 04/17/2012   Procedure: CAROTID ARTERY ANGIOPLASTY; Location: [facility]; Surgeon: Levora Dredge, MD   CAROTID ENDARTERECTOMY Right 04/17/2012   Procedure: CAROTID ENDARTARECTOMY; Location: [facility]; Surgeon: Levora Dredge, MD   CHOLECYSTECTOMY, LAPAROSCOPIC  2005   COLONOSCOPY     CYST EXCISION     "between legs"   EP IMPLANTABLE DEVICE N/A 02/23/2016   Procedure: Loop Recorder Insertion;  Surgeon: Hillis Range, MD;  Location: [facility] INVASIVE CV LAB;  Service: Cardiovascular;  Laterality: N/A;   HEMORRHOID SURGERY  2009   INCISION AND DRAINAGE WOUND WITH NERVE AND ARTERY REPAIR Left 10/04/2017   Procedure: LEFT HAND WOUND EXPLORATION repair as necessary;  Surgeon: Bradly Bienenstock, MD;  Location: [facility] OR;  Service: Orthopedics;  Laterality: Left;   MOHS SURGERY Right 12/24/2022   - Katrine Coho, MD   TEE WITHOUT CARDIOVERSION N/A 02/23/2016   Procedure: TRANSESOPHAGEAL ECHOCARDIOGRAM (TEE);  Surgeon: Chrystie Nose, MD;  Location: [facility] ENDOSCOPY;  Service: Cardiovascular;  Laterality: N/A;  ALSO GETTING A LOOP   TRANSCATHETER AORTIC VALVE REPLACEMENT, TRANSFEMORAL  09/09/2022   Procedure: TRANSCATHETER AORTIC VALVE REPLACEMENT, TRANSFEMORAL; Location: [facility]; Surgeon: Nance Pew, MD    FAMILY HISTORY: family history includes Diabetes in his mother; Hodgkin's lymphoma in his  father.  SOCIAL HISTORY:  reports that he quit smoking about 10 years ago. His smoking use included cigars. He has  never used smokeless tobacco. He reports that he does not drink alcohol and does not use drugs.  ALLERGIES: Pioglitazone  MEDICATIONS:  Current Outpatient Medications  Medication Sig Dispense Refill   acyclovir (ZOVIRAX) 400 MG tablet 1/2 tablet up to 5 times a day for cold sores (Patient taking differently: as needed. 1/2 tablet up to 5 times a day for cold sores) 30 tablet 5   albuterol (VENTOLIN HFA) 108 (90 Base) MCG/ACT inhaler Inhale 2 puffs into the lungs every 6 (six) hours as needed for wheezing or shortness of breath.     atorvastatin (LIPITOR) 20 MG tablet Take 1 tablet (20 mg total) by mouth every evening. (Patient taking differently: Take 40 mg by mouth every evening.) 90 tablet 3   benzocaine (ORAJEL) 10 % mucosal gel Use as directed 1 application in the mouth or throat as needed for mouth pain.     Cholecalciferol (VITAMIN D PO) Take 2,000 Units by mouth daily.      Docosanol 10 % CREA Apply topically up to 5 times per day as needed until heals 2 g 3   empagliflozin (JARDIANCE) 25 MG TABS tablet Take 25 mg by mouth daily. 90 tablet 4   Flaxseed, Linseed, (FLAXSEED OIL) 1000 MG CAPS Take 1,000 mg by mouth 2 (two) times daily.     fluticasone (FLONASE) 50 MCG/ACT nasal spray Place 2 sprays into both nostrils daily. (Patient taking differently: Place 2 sprays into both nostrils as needed.) 16 g 1   furosemide (LASIX) 20 MG tablet TAKE ONE (1) TABLET BY MOUTH ONCE DAILY AS NEEDED FOR EDEMA (TAKE 1 TABLET DAILYAS NEEDED FOR WEIGHT GAIN > 2 POUNDS IN 24 HOURS) 30 tablet 1   gabapentin (NEURONTIN) 100 MG capsule Take 1 capsule (100 mg total) by mouth 2 (two) times daily. 180 capsule 1   HYDROcodone-acetaminophen (NORCO/VICODIN) 5-325 MG tablet Take 1 tablet by mouth every 4 (four) hours as needed for moderate pain. 12 tablet 0   insulin detemir (LEVEMIR) 100 UNIT/ML injection Inject 0.3 mLs (30 Units total) into the skin 2 (two) times daily. (Patient taking differently: Inject 60 Units  into the skin 2 (two) times daily.) 100 mL 1   levETIRAcetam (KEPPRA) 750 MG tablet Take 750 mg by mouth 2 (two) times daily.     lisinopril (ZESTRIL) 2.5 MG tablet Take 5 mg by mouth daily.     loratadine (CLARITIN) 10 MG tablet Take 10 mg by mouth daily.     metFORMIN (GLUCOPHAGE) 850 MG tablet Take 1 tablet (850 mg total) by mouth 2 (two) times daily with a meal. 180 tablet 1   metoprolol tartrate (LOPRESSOR) 25 MG tablet Take 0.5 tablets by mouth every 12 (twelve) hours.     Misc Natural Products (OSTEO BI-FLEX TRIPLE STRENGTH PO) Take 1 tablet by mouth 2 (two) times daily.      MISC NATURAL PRODUCTS PO Take 1 capsule by mouth in the morning and at bedtime. Super Beta Prostate     Omega-3 Fatty Acids (FISH OIL) 1000 MG CPDR Take 1,200 mg by mouth 2 (two) times daily.      PARoxetine (PAXIL) 40 MG tablet TAKE 1 TABLET BY MOUTH DAILY. 30 tablet 0   rivaroxaban (XARELTO) 20 MG TABS tablet TAKE ONE TABLET BY MOUTH DAILY WITH SUPPER 30 tablet 12   No current facility-administered medications for this encounter.  ECOG PERFORMANCE STATUS:  1 - Symptomatic but completely ambulatory  REVIEW OF SYSTEMS: Patient denies any weight loss, fatigue, weakness, fever, chills or night sweats. Patient denies any loss of vision, blurred vision. Patient denies any ringing  of the ears or hearing loss. No irregular heartbeat. Patient denies heart murmur or history of fainting. Patient denies any chest pain or pain radiating to her upper extremities. Patient denies any shortness of breath, difficulty breathing at night, cough or hemoptysis. Patient denies any swelling in the lower legs. Patient denies any nausea vomiting, vomiting of blood, or coffee ground material in the vomitus. Patient denies any stomach pain. Patient states has had normal bowel movements no significant constipation or diarrhea. Patient denies any dysuria, hematuria or significant nocturia. Patient denies any problems walking, swelling in the  joints or loss of balance. Patient denies any skin changes, loss of hair or loss of weight. Patient denies any excessive worrying or anxiety or significant depression. Patient denies any problems with insomnia. Patient denies excessive thirst, polyuria, polydipsia. Patient denies any swollen glands, patient denies easy bruising or easy bleeding. Patient denies any recent infections, allergies or URI. Patient "s visual fields have not changed significantly in recent time.   PHYSICAL EXAM: There were no vitals taken for this visit. Oral cavity shows multiple excisions of his lateral tongue.  No overt evidence of gross disease is noted.  No evidence of submental cervical or supraclavicular adenopathy.  Well-developed well-nourished patient in NAD. HEENT reveals PERLA, EOMI, discs not visualized.  Oral cavity is clear. No oral mucosal lesions are identified. Neck is clear without evidence of cervical or supraclavicular adenopathy. Lungs are clear to A&P. Cardiac examination is essentially unremarkable with regular rate and rhythm without murmur rub or thrill. Abdomen is benign with no organomegaly or masses noted. Motor sensory and DTR levels are equal and symmetric in the upper and lower extremities. Cranial nerves II through XII are grossly intact. Proprioception is intact. No peripheral adenopathy or edema is identified. No motor or sensory levels are noted. Crude visual fields are within normal range.  LABORATORY DATA: Pathology reports reviewed    RADIOLOGY RESULTS: CT scans reviewed PET CT scan ordered   IMPRESSION: Squamous cell carcinoma of the oral tongue s/p resection with positive margins and 77 year old male  PLAN: I have ordered a PET CT scan to delineate any possibility of further macroscopic disease either in the tongue or draining nodes.  Based on PET scan findings would determine extent of radiation field and dose.  Would plan on delivering 60 Gray over 6 weeks using IMRT treatment  planning and deliver to spare critical structures such as a salivary gland oral cavity and spine.  Risks and benefits of treatment including oral mucositis fatigue alteration of taste skin reaction all were discussed in detail with the patient.  He seems to comprehend my recommendations well.  Since simulation will be arranged after his PET CT scan has been ordered.  I would like to take this opportunity to thank you for allowing me to participate in the care of your patient.Carmina Miller, MD

## 2023-04-02 NOTE — Progress Notes (Signed)
Discharge Progress Report  Patient Details  Name: Sean Hayden MRN: [id number] Date of Birth: 01-30-1946 Referring Provider:   Flowsheet Row Cardiac Rehab from 01/13/2023 in [facility] Cardiac and Pulmonary Rehab  Referring Provider Mila Merry MD        Number of Visits: 15  Reason for Discharge:  Early Exit:  Medical    Functional Capacity:  6 Minute Walk     Row Name 01/13/23 1644         6 Minute Walk   Phase Initial     Distance 657 feet     Walk Time 6 minutes     # of Rest Breaks 0     MPH 1.24     METS 1.43     RPE 13     Perceived Dyspnea  0     VO2 Peak 5     Symptoms Yes (comment)     Comments Legs fatigued     Resting HR 102 bpm     Resting BP 104/58     Resting Oxygen Saturation  96 %     Exercise Oxygen Saturation  during 6 min walk 95 %     Max Ex. HR 156 bpm     Max Ex. BP 128/62     2 Minute Post BP 108/64              Nutrition & Weight - Outcomes:  Pre Biometrics - 01/13/23 1646       Pre Biometrics   Height 5' 6.5" (1.689 m)    Weight 224 lb 8 oz (101.8 kg)    BMI (Calculated) 35.7    Single Leg Stand 0 seconds

## 2023-04-04 ENCOUNTER — Ambulatory Visit
Admission: RE | Admit: 2023-04-04 | Discharge: 2023-04-04 | Disposition: A | Discharge status: Home / Self Care | Payer: Medicare HMO | Source: Ambulatory Visit | Attending: Radiation Oncology | Admitting: Radiation Oncology

## 2023-04-04 DIAGNOSIS — D0007 Carcinoma in situ of tongue: Secondary | ICD-10-CM

## 2023-04-04 DIAGNOSIS — K769 Liver disease, unspecified: Secondary | ICD-10-CM | POA: Diagnosis not present

## 2023-04-04 DIAGNOSIS — R933 Abnormal findings on diagnostic imaging of other parts of digestive tract: Secondary | ICD-10-CM | POA: Insufficient documentation

## 2023-04-04 DIAGNOSIS — C01 Malignant neoplasm of base of tongue: Secondary | ICD-10-CM | POA: Diagnosis not present

## 2023-04-04 DIAGNOSIS — J9811 Atelectasis: Secondary | ICD-10-CM | POA: Diagnosis not present

## 2023-04-04 DIAGNOSIS — R590 Localized enlarged lymph nodes: Secondary | ICD-10-CM | POA: Insufficient documentation

## 2023-04-04 DIAGNOSIS — J90 Pleural effusion, not elsewhere classified: Secondary | ICD-10-CM | POA: Insufficient documentation

## 2023-04-04 LAB — GLUCOSE, CAPILLARY: Glucose-Capillary: 92 mg/dL (ref 70–99)

## 2023-04-04 MED ORDER — FLUDEOXYGLUCOSE F - 18 (FDG) INJECTION
11.8000 | Freq: Once | INTRAVENOUS | Status: AC | PRN
  Administered 2023-04-04: 12.18 via INTRAVENOUS

## 2023-04-07 ENCOUNTER — Ambulatory Visit (INDEPENDENT_AMBULATORY_CARE_PROVIDER_SITE_OTHER): Payer: Medicare HMO | Admitting: Family Medicine

## 2023-04-07 ENCOUNTER — Encounter: Payer: Self-pay | Admitting: Family Medicine

## 2023-04-07 VITALS — BP 96/75 | Ht 67.0 in | Wt 227.0 lb

## 2023-04-07 DIAGNOSIS — R609 Edema, unspecified: Secondary | ICD-10-CM

## 2023-04-07 DIAGNOSIS — Z953 Presence of xenogenic heart valve: Secondary | ICD-10-CM | POA: Diagnosis not present

## 2023-04-07 DIAGNOSIS — I5020 Unspecified systolic (congestive) heart failure: Secondary | ICD-10-CM

## 2023-04-07 DIAGNOSIS — I480 Paroxysmal atrial fibrillation: Secondary | ICD-10-CM | POA: Diagnosis not present

## 2023-04-07 DIAGNOSIS — E1140 Type 2 diabetes mellitus with diabetic neuropathy, unspecified: Secondary | ICD-10-CM

## 2023-04-07 DIAGNOSIS — I5032 Chronic diastolic (congestive) heart failure: Secondary | ICD-10-CM

## 2023-04-07 DIAGNOSIS — C023 Malignant neoplasm of anterior two-thirds of tongue, part unspecified: Secondary | ICD-10-CM | POA: Diagnosis not present

## 2023-04-07 DIAGNOSIS — I420 Dilated cardiomyopathy: Secondary | ICD-10-CM

## 2023-04-07 DIAGNOSIS — E785 Hyperlipidemia, unspecified: Secondary | ICD-10-CM

## 2023-04-07 DIAGNOSIS — G4733 Obstructive sleep apnea (adult) (pediatric): Secondary | ICD-10-CM

## 2023-04-07 DIAGNOSIS — I679 Cerebrovascular disease, unspecified: Secondary | ICD-10-CM | POA: Diagnosis not present

## 2023-04-07 DIAGNOSIS — R569 Unspecified convulsions: Secondary | ICD-10-CM | POA: Diagnosis not present

## 2023-04-07 DIAGNOSIS — Z794 Long term (current) use of insulin: Secondary | ICD-10-CM

## 2023-04-07 DIAGNOSIS — I10 Essential (primary) hypertension: Secondary | ICD-10-CM | POA: Diagnosis not present

## 2023-04-07 DIAGNOSIS — F439 Reaction to severe stress, unspecified: Secondary | ICD-10-CM

## 2023-04-07 LAB — POCT GLYCOSYLATED HEMOGLOBIN (HGB A1C)
Est. average glucose Bld gHb Est-mCnc: 183
Hemoglobin A1C: 8 % — AB (ref 4.0–5.6)

## 2023-04-07 MED ORDER — LEVETIRACETAM 750 MG PO TABS
750.0000 mg | ORAL_TABLET | Freq: Two times a day (BID) | ORAL | 2 refills | Status: AC
Start: 2023-04-07 — End: ?

## 2023-04-07 MED ORDER — FUROSEMIDE 20 MG PO TABS: ORAL_TABLET | ORAL | 2 refills | Status: DC

## 2023-04-07 MED ORDER — INSULIN DETEMIR 100 UNIT/ML ~~LOC~~ SOLN: 60.0000 [IU] | Freq: Two times a day (BID) | SUBCUTANEOUS | Status: DC

## 2023-04-07 MED ORDER — PAROXETINE HCL 40 MG PO TABS
40.0000 mg | ORAL_TABLET | Freq: Every day | ORAL | 3 refills | Status: AC
Start: 2023-04-07 — End: ?

## 2023-04-07 MED ORDER — METOPROLOL TARTRATE 25 MG PO TABS: 12.5000 mg | ORAL_TABLET | Freq: Two times a day (BID) | ORAL | 2 refills | Status: DC

## 2023-04-07 MED ORDER — EMPAGLIFLOZIN 25 MG PO TABS: 25.0000 mg | ORAL_TABLET | Freq: Every day | ORAL | 2 refills | Status: DC

## 2023-04-07 NOTE — Progress Notes (Signed)
Established patient visit   Patient: Sean Hayden   DOB: [birth year]   77 y.o. Male  MRN: [id number] Visit Date: 04/07/2023  Today's healthcare provider: Mila Merry, MD   Chief Complaint  Patient presents with   Follow-up    3 months f/u   Diabetes   Hypertension   Hyperlipidemia   Subjective     Here today to follow up on diabetes, hypertension, CHF and lipids. History of CVA with CVD. Has recently been diagnosed with cancer of tongue and anticipates starting radiation therapy after upcoming PET. Has not been eating well due to tongue lesion. Not checking sugars consistently. Denies any other neurological symptoms. He also has history seizure and is having trouble getting medications from [facility] dispensed in a timely manner, and requests refills of several medications usually prescribed at the [place].   Lab Results  Component Value Date   HGBA1C 8.0 (A) 04/07/2023   HGBA1C 7.6 (A) 05/20/2022   HGBA1C 8.5 (H) 01/25/2022   Last metabolic panel Lab Results  Component Value Date   GLUCOSE 181 (H) 03/17/2023   NA 138 03/17/2023   K 3.9 03/17/2023   CL 101 03/17/2023   CO2 30 03/17/2023   BUN 23 03/17/2023   CREATININE 0.81 03/17/2023   GFRNONAA >60 03/17/2023   CALCIUM 8.7 (L) 03/17/2023   PROT 6.7 03/17/2023   ALBUMIN 3.6 03/17/2023   LABGLOB 2.5 01/25/2022   AGRATIO 1.7 01/25/2022   BILITOT 0.8 03/17/2023   ALKPHOS 77 03/17/2023   AST 20 03/17/2023   ALT 15 03/17/2023   ANIONGAP 7 03/17/2023   Last lipids Lab Results  Component Value Date   CHOL 111 01/25/2022   HDL 33 (L) 01/25/2022   LDLCALC 45 01/25/2022   TRIG 200 (H) 01/25/2022   CHOLHDL 3.4 01/25/2022     Medications: Outpatient Medications Prior to Visit  Medication Sig   acyclovir (ZOVIRAX) 400 MG tablet 1/2 tablet up to 5 times a day for cold sores (Patient taking differently: as needed. 1/2 tablet up to 5 times a day for cold sores)   albuterol (VENTOLIN HFA) 108 (90 Base) MCG/ACT inhaler  Inhale 2 puffs into the lungs every 6 (six) hours as needed for wheezing or shortness of breath.   atorvastatin (LIPITOR) 20 MG tablet Take 1 tablet (20 mg total) by mouth every evening. (Patient taking differently: Take 40 mg by mouth every evening.)   benzocaine (ORAJEL) 10 % mucosal gel Use as directed 1 application in the mouth or throat as needed for mouth pain.   Cholecalciferol (VITAMIN D PO) Take 2,000 Units by mouth daily.    Docosanol 10 % CREA Apply topically up to 5 times per day as needed until heals   Flaxseed, Linseed, (FLAXSEED OIL) 1000 MG CAPS Take 1,000 mg by mouth 2 (two) times daily.   fluticasone (FLONASE) 50 MCG/ACT nasal spray Place 2 sprays into both nostrils daily. (Patient taking differently: Place 2 sprays into both nostrils as needed.)   gabapentin (NEURONTIN) 100 MG capsule Take 1 capsule (100 mg total) by mouth 2 (two) times daily.   HYDROcodone-acetaminophen (NORCO/VICODIN) 5-325 MG tablet Take 1 tablet by mouth every 4 (four) hours as needed for moderate pain.   lisinopril (ZESTRIL) 2.5 MG tablet Take 5 mg by mouth daily.   loratadine (CLARITIN) 10 MG tablet Take 10 mg by mouth daily.   metFORMIN (GLUCOPHAGE) 850 MG tablet Take 1 tablet (850 mg total) by mouth 2 (two) times daily with  a meal.   Misc Natural Products (OSTEO BI-FLEX TRIPLE STRENGTH PO) Take 1 tablet by mouth 2 (two) times daily.    MISC NATURAL PRODUCTS PO Take 1 capsule by mouth in the morning and at bedtime. Super Beta Prostate   Omega-3 Fatty Acids (FISH OIL) 1000 MG CPDR Take 1,200 mg by mouth 2 (two) times daily.    rivaroxaban (XARELTO) 20 MG TABS tablet TAKE ONE TABLET BY MOUTH DAILY WITH SUPPER   sucralfate (CARAFATE) 1 g tablet Take 0.5 tablets (0.5 g total) by mouth 4 (four) times daily -  with meals and at bedtime. Dissolve half a tablet in 4 tablespoons warm water swish and swallow   [DISCONTINUED] empagliflozin (JARDIANCE) 25 MG TABS tablet Take 25 mg by mouth daily.   [DISCONTINUED]  furosemide (LASIX) 20 MG tablet TAKE ONE (1) TABLET BY MOUTH ONCE DAILY AS NEEDED FOR EDEMA (TAKE 1 TABLET DAILYAS NEEDED FOR WEIGHT GAIN > 2 POUNDS IN 24 HOURS)   [DISCONTINUED] insulin detemir (LEVEMIR) 100 UNIT/ML injection Inject 0.3 mLs (30 Units total) into the skin 2 (two) times daily. (Patient taking differently: Inject 60 Units into the skin 2 (two) times daily.)   [DISCONTINUED] levETIRAcetam (KEPPRA) 750 MG tablet Take 750 mg by mouth 2 (two) times daily.   [DISCONTINUED] metoprolol tartrate (LOPRESSOR) 25 MG tablet Take 0.5 tablets by mouth every 12 (twelve) hours.   [DISCONTINUED] PARoxetine (PAXIL) 40 MG tablet TAKE 1 TABLET BY MOUTH DAILY.   No facility-administered medications prior to visit.    Review of Systems  Constitutional:  Positive for fatigue. Negative for appetite change, chills, diaphoresis and fever.  Respiratory:  Negative for chest tightness, shortness of breath and wheezing.   Cardiovascular:  Negative for chest pain and palpitations.  Gastrointestinal:  Negative for abdominal pain, nausea and vomiting.       Objective    BP 96/75 (BP Location: Left Arm, Patient Position: Sitting, Cuff Size: Normal)   Ht 5\' 7"  (1.702 m)   Wt 227 lb (103 kg)   BMI 35.55 kg/m    Physical Exam   General: Appearance:    Obese male in no acute distress  Eyes:    PERRL, conjunctiva/corneas clear, EOM's intact       Lungs:     Clear to auscultation bilaterally, respirations unlabored  Heart:    Normal heart rate. Irregularly irregular rhythm. No murmurs, rubs, or gallops.    MS:   All extremities are intact.    Neurologic:   Awake, alert, oriented x 3. No apparent focal neurological defect.         Results for orders placed or performed in visit on 04/07/23  POCT HgB A1C  Result Value Ref Range   Hemoglobin A1C 8.0 (A) 4.0 - 5.6 %   Est. average glucose Bld gHb Est-mCnc 183      Assessment & Plan     1. Partial seizure (HCC) Under complete control. Needs refill  levETIRAcetam (KEPPRA) 750 MG tablet; Take 1 tablet (750 mg total) by mouth 2 (two) times daily.  Dispense: 180 tablet; Refill: 2  2. Type 2 diabetes mellitus with diabetic neuropathy, with long-term current use of insulin (HCC) Not quite to goal, but anticipate some trouble with oral intake and additional weight loss with upcoming treatment for cancer of the tongue. Continue current medications.     Reill  - empagliflozin (JARDIANCE) 25 MG TABS tablet; Take 1 tablet (25 mg total) by mouth daily.  Dispense: 90 tablet; Refill: 2 -  insulin detemir (LEVEMIR) 100 UNIT/ML injection; Inject 0.6 mLs (60 Units total) into the skin 2 (two) times daily.  Follow up 3-4 months.   3. Paroxysmal atrial fibrillation (HCC) Rate controlled, on DOAC  4. Chronic diastolic heart failure (HCC)   5. HFrEF (heart failure with reduced ejection fraction) (HCC) Well compensated, Continue current medications.  Continue routine cardiology follow up   6. S/p TAVR (transcatheter aortic valve replacement), bioprosthetic Dong well.   7. Hyperlipidemia, unspecified hyperlipidemia type He is tolerating atorvastatin well with no adverse effects.    8. Cerebrovascular disease Asymptomatic. Compliant with medication.  Continue aggressive risk factor modification.    9. Primary hypertension Continue current medications.  refill metoprolol tartrate (LOPRESSOR) 25 MG tablet; Take 0.5 tablets (12.5 mg total) by mouth every 12 (twelve) hours.  Dispense: 90 tablet; Refill: 2  10. Situational stress Continue PARoxetine (PAXIL) 40 MG tablet; Take 1 tablet (40 mg total) by mouth daily.  Dispense: 90 tablet; Refill: 3  11. Edema, unspecified type refill furosemide (LASIX) 20 MG tablet; TAKE ONE (1) TABLET BY MOUTH ONCE DAILY AS NEEDED FOR EDEMA OR WEIGHT GAIN > 2 POUNDS IN 24 HOURS  Dispense: 90 tablet; Refill: 2  12. OSA (obstructive sleep apnea) Stable on CPAP  13. Nonischemic congestive cardiomyopathy  (HCC) Asymptomatic. Compliant with medication.  Continue aggressive risk factor modification.    14. Morbid obesity (HCC) Consider adding semaglutide if additional weight loss and glycemic control are needed after treatment for tongue cancer.    Addressed extensive list of chronic and acute medical problems today requiring 50  minutes reviewing his medical record, counseling patient regarding his conditions and coordination of care.     Return in about 14 weeks (around 07/14/2023) for diabetes.         Mila Merry, MD  [facility] [phone] (phone) [phone] (fax)  [facility]

## 2023-04-07 NOTE — Patient Instructions (Signed)
.   Please review the attached list of medications and notify my office if there are any errors.   . Please bring all of your medications to every appointment so we can make sure that our medication list is the same as yours.   

## 2023-04-08 ENCOUNTER — Ambulatory Visit: Payer: Medicare HMO

## 2023-04-09 ENCOUNTER — Ambulatory Visit
Admission: RE | Admit: 2023-04-09 | Discharge: 2023-04-09 | Disposition: A | Discharge status: Home / Self Care | Payer: Medicare HMO | Source: Ambulatory Visit | Attending: Radiation Oncology | Admitting: Radiation Oncology

## 2023-04-09 DIAGNOSIS — I2510 Atherosclerotic heart disease of native coronary artery without angina pectoris: Secondary | ICD-10-CM | POA: Insufficient documentation

## 2023-04-09 DIAGNOSIS — Z87891 Personal history of nicotine dependence: Secondary | ICD-10-CM | POA: Diagnosis not present

## 2023-04-09 DIAGNOSIS — E1140 Type 2 diabetes mellitus with diabetic neuropathy, unspecified: Secondary | ICD-10-CM | POA: Diagnosis not present

## 2023-04-09 DIAGNOSIS — Z7984 Long term (current) use of oral hypoglycemic drugs: Secondary | ICD-10-CM | POA: Diagnosis not present

## 2023-04-09 DIAGNOSIS — I420 Dilated cardiomyopathy: Secondary | ICD-10-CM | POA: Diagnosis not present

## 2023-04-09 DIAGNOSIS — C023 Malignant neoplasm of anterior two-thirds of tongue, part unspecified: Secondary | ICD-10-CM | POA: Diagnosis not present

## 2023-04-09 DIAGNOSIS — Z8719 Personal history of other diseases of the digestive system: Secondary | ICD-10-CM | POA: Insufficient documentation

## 2023-04-09 DIAGNOSIS — I5022 Chronic systolic (congestive) heart failure: Secondary | ICD-10-CM | POA: Diagnosis not present

## 2023-04-09 DIAGNOSIS — E785 Hyperlipidemia, unspecified: Secondary | ICD-10-CM | POA: Diagnosis not present

## 2023-04-09 DIAGNOSIS — Z7901 Long term (current) use of anticoagulants: Secondary | ICD-10-CM | POA: Insufficient documentation

## 2023-04-09 DIAGNOSIS — Z794 Long term (current) use of insulin: Secondary | ICD-10-CM | POA: Insufficient documentation

## 2023-04-09 DIAGNOSIS — I110 Hypertensive heart disease with heart failure: Secondary | ICD-10-CM | POA: Insufficient documentation

## 2023-04-09 DIAGNOSIS — Z8673 Personal history of transient ischemic attack (TIA), and cerebral infarction without residual deficits: Secondary | ICD-10-CM | POA: Diagnosis not present

## 2023-04-09 DIAGNOSIS — Z79899 Other long term (current) drug therapy: Secondary | ICD-10-CM | POA: Diagnosis not present

## 2023-04-09 DIAGNOSIS — Z952 Presence of prosthetic heart valve: Secondary | ICD-10-CM | POA: Insufficient documentation

## 2023-04-09 DIAGNOSIS — J45909 Unspecified asthma, uncomplicated: Secondary | ICD-10-CM | POA: Insufficient documentation

## 2023-04-09 DIAGNOSIS — Z87442 Personal history of urinary calculi: Secondary | ICD-10-CM | POA: Diagnosis not present

## 2023-04-09 DIAGNOSIS — I480 Paroxysmal atrial fibrillation: Secondary | ICD-10-CM | POA: Diagnosis not present

## 2023-04-09 DIAGNOSIS — Z85828 Personal history of other malignant neoplasm of skin: Secondary | ICD-10-CM | POA: Diagnosis not present

## 2023-04-09 DIAGNOSIS — I350 Nonrheumatic aortic (valve) stenosis: Secondary | ICD-10-CM | POA: Diagnosis not present

## 2023-04-09 DIAGNOSIS — Z510 Encounter for antineoplastic radiation therapy: Secondary | ICD-10-CM | POA: Diagnosis not present

## 2023-04-11 ENCOUNTER — Other Ambulatory Visit: Payer: Self-pay | Admitting: *Deleted

## 2023-04-11 DIAGNOSIS — D0007 Carcinoma in situ of tongue: Secondary | ICD-10-CM

## 2023-04-16 DIAGNOSIS — I2510 Atherosclerotic heart disease of native coronary artery without angina pectoris: Secondary | ICD-10-CM | POA: Diagnosis not present

## 2023-04-16 DIAGNOSIS — I480 Paroxysmal atrial fibrillation: Secondary | ICD-10-CM | POA: Diagnosis not present

## 2023-04-16 DIAGNOSIS — I350 Nonrheumatic aortic (valve) stenosis: Secondary | ICD-10-CM | POA: Diagnosis not present

## 2023-04-16 DIAGNOSIS — C023 Malignant neoplasm of anterior two-thirds of tongue, part unspecified: Secondary | ICD-10-CM | POA: Diagnosis not present

## 2023-04-16 DIAGNOSIS — I420 Dilated cardiomyopathy: Secondary | ICD-10-CM | POA: Diagnosis not present

## 2023-04-16 DIAGNOSIS — E1140 Type 2 diabetes mellitus with diabetic neuropathy, unspecified: Secondary | ICD-10-CM | POA: Diagnosis not present

## 2023-04-16 DIAGNOSIS — E785 Hyperlipidemia, unspecified: Secondary | ICD-10-CM | POA: Diagnosis not present

## 2023-04-16 DIAGNOSIS — I110 Hypertensive heart disease with heart failure: Secondary | ICD-10-CM | POA: Diagnosis not present

## 2023-04-16 DIAGNOSIS — Z510 Encounter for antineoplastic radiation therapy: Secondary | ICD-10-CM | POA: Diagnosis not present

## 2023-04-16 DIAGNOSIS — J45909 Unspecified asthma, uncomplicated: Secondary | ICD-10-CM | POA: Diagnosis not present

## 2023-04-17 ENCOUNTER — Ambulatory Visit: Admission: RE | Admit: 2023-04-17 | Payer: Medicare HMO | Source: Ambulatory Visit

## 2023-04-20 DIAGNOSIS — L089 Local infection of the skin and subcutaneous tissue, unspecified: Secondary | ICD-10-CM | POA: Diagnosis not present

## 2023-04-20 DIAGNOSIS — S80812A Abrasion, left lower leg, initial encounter: Secondary | ICD-10-CM | POA: Diagnosis not present

## 2023-04-21 ENCOUNTER — Other Ambulatory Visit: Payer: Self-pay

## 2023-04-21 ENCOUNTER — Ambulatory Visit
Admission: RE | Admit: 2023-04-21 | Discharge: 2023-04-21 | Disposition: A | Discharge status: Home / Self Care | Payer: Medicare HMO | Source: Ambulatory Visit | Attending: Radiation Oncology | Admitting: Radiation Oncology

## 2023-04-21 DIAGNOSIS — I2510 Atherosclerotic heart disease of native coronary artery without angina pectoris: Secondary | ICD-10-CM | POA: Diagnosis not present

## 2023-04-21 DIAGNOSIS — J45909 Unspecified asthma, uncomplicated: Secondary | ICD-10-CM | POA: Diagnosis not present

## 2023-04-21 DIAGNOSIS — I420 Dilated cardiomyopathy: Secondary | ICD-10-CM | POA: Diagnosis not present

## 2023-04-21 DIAGNOSIS — I480 Paroxysmal atrial fibrillation: Secondary | ICD-10-CM | POA: Diagnosis not present

## 2023-04-21 DIAGNOSIS — E785 Hyperlipidemia, unspecified: Secondary | ICD-10-CM | POA: Diagnosis not present

## 2023-04-21 DIAGNOSIS — I350 Nonrheumatic aortic (valve) stenosis: Secondary | ICD-10-CM | POA: Diagnosis not present

## 2023-04-21 DIAGNOSIS — I110 Hypertensive heart disease with heart failure: Secondary | ICD-10-CM | POA: Diagnosis not present

## 2023-04-21 DIAGNOSIS — E1140 Type 2 diabetes mellitus with diabetic neuropathy, unspecified: Secondary | ICD-10-CM | POA: Diagnosis not present

## 2023-04-21 DIAGNOSIS — C023 Malignant neoplasm of anterior two-thirds of tongue, part unspecified: Secondary | ICD-10-CM | POA: Diagnosis not present

## 2023-04-21 DIAGNOSIS — Z510 Encounter for antineoplastic radiation therapy: Secondary | ICD-10-CM | POA: Diagnosis not present

## 2023-04-21 LAB — RAD ONC ARIA SESSION SUMMARY
Course Elapsed Days: 0
Plan Fractions Treated to Date: 1
Plan Prescribed Dose Per Fraction: 2 Gy
Plan Total Fractions Prescribed: 30
Plan Total Prescribed Dose: 60 Gy
Reference Point Dosage Given to Date: 2 Gy
Reference Point Session Dosage Given: 2 Gy
Session Number: 1

## 2023-04-22 ENCOUNTER — Ambulatory Visit
Admission: RE | Admit: 2023-04-22 | Discharge: 2023-04-22 | Disposition: A | Discharge status: Home / Self Care | Payer: Medicare HMO | Source: Ambulatory Visit | Attending: Radiation Oncology | Admitting: Radiation Oncology

## 2023-04-22 ENCOUNTER — Other Ambulatory Visit: Payer: Self-pay

## 2023-04-22 DIAGNOSIS — Z510 Encounter for antineoplastic radiation therapy: Secondary | ICD-10-CM | POA: Diagnosis not present

## 2023-04-22 DIAGNOSIS — I350 Nonrheumatic aortic (valve) stenosis: Secondary | ICD-10-CM | POA: Diagnosis not present

## 2023-04-22 DIAGNOSIS — C023 Malignant neoplasm of anterior two-thirds of tongue, part unspecified: Secondary | ICD-10-CM | POA: Diagnosis not present

## 2023-04-22 DIAGNOSIS — I420 Dilated cardiomyopathy: Secondary | ICD-10-CM | POA: Diagnosis not present

## 2023-04-22 DIAGNOSIS — I110 Hypertensive heart disease with heart failure: Secondary | ICD-10-CM | POA: Diagnosis not present

## 2023-04-22 DIAGNOSIS — E785 Hyperlipidemia, unspecified: Secondary | ICD-10-CM | POA: Diagnosis not present

## 2023-04-22 DIAGNOSIS — I2510 Atherosclerotic heart disease of native coronary artery without angina pectoris: Secondary | ICD-10-CM | POA: Diagnosis not present

## 2023-04-22 DIAGNOSIS — J45909 Unspecified asthma, uncomplicated: Secondary | ICD-10-CM | POA: Diagnosis not present

## 2023-04-22 DIAGNOSIS — E1140 Type 2 diabetes mellitus with diabetic neuropathy, unspecified: Secondary | ICD-10-CM | POA: Diagnosis not present

## 2023-04-22 DIAGNOSIS — I480 Paroxysmal atrial fibrillation: Secondary | ICD-10-CM | POA: Diagnosis not present

## 2023-04-22 LAB — RAD ONC ARIA SESSION SUMMARY
Course Elapsed Days: 1
Plan Fractions Treated to Date: 2
Plan Prescribed Dose Per Fraction: 2 Gy
Plan Total Fractions Prescribed: 30
Plan Total Prescribed Dose: 60 Gy
Reference Point Dosage Given to Date: 4 Gy
Reference Point Session Dosage Given: 2 Gy
Session Number: 2

## 2023-04-23 ENCOUNTER — Other Ambulatory Visit: Payer: Self-pay

## 2023-04-23 ENCOUNTER — Ambulatory Visit
Admission: RE | Admit: 2023-04-23 | Discharge: 2023-04-23 | Disposition: A | Discharge status: Home / Self Care | Payer: Medicare HMO | Source: Ambulatory Visit | Attending: Radiation Oncology | Admitting: Radiation Oncology

## 2023-04-23 ENCOUNTER — Inpatient Hospital Stay: Payer: Medicare HMO | Attending: Family Medicine

## 2023-04-23 DIAGNOSIS — E1140 Type 2 diabetes mellitus with diabetic neuropathy, unspecified: Secondary | ICD-10-CM | POA: Diagnosis not present

## 2023-04-23 DIAGNOSIS — I350 Nonrheumatic aortic (valve) stenosis: Secondary | ICD-10-CM | POA: Diagnosis not present

## 2023-04-23 DIAGNOSIS — I420 Dilated cardiomyopathy: Secondary | ICD-10-CM | POA: Diagnosis not present

## 2023-04-23 DIAGNOSIS — J45909 Unspecified asthma, uncomplicated: Secondary | ICD-10-CM | POA: Diagnosis not present

## 2023-04-23 DIAGNOSIS — E785 Hyperlipidemia, unspecified: Secondary | ICD-10-CM | POA: Diagnosis not present

## 2023-04-23 DIAGNOSIS — I480 Paroxysmal atrial fibrillation: Secondary | ICD-10-CM | POA: Diagnosis not present

## 2023-04-23 DIAGNOSIS — I2510 Atherosclerotic heart disease of native coronary artery without angina pectoris: Secondary | ICD-10-CM | POA: Diagnosis not present

## 2023-04-23 DIAGNOSIS — I110 Hypertensive heart disease with heart failure: Secondary | ICD-10-CM | POA: Diagnosis not present

## 2023-04-23 DIAGNOSIS — C023 Malignant neoplasm of anterior two-thirds of tongue, part unspecified: Secondary | ICD-10-CM | POA: Diagnosis not present

## 2023-04-23 DIAGNOSIS — Z510 Encounter for antineoplastic radiation therapy: Secondary | ICD-10-CM | POA: Diagnosis not present

## 2023-04-23 LAB — RAD ONC ARIA SESSION SUMMARY
Course Elapsed Days: 2
Plan Fractions Treated to Date: 3
Plan Prescribed Dose Per Fraction: 2 Gy
Plan Total Fractions Prescribed: 30
Plan Total Prescribed Dose: 60 Gy
Reference Point Dosage Given to Date: 6 Gy
Reference Point Session Dosage Given: 2 Gy
Session Number: 3

## 2023-04-24 ENCOUNTER — Other Ambulatory Visit: Payer: Self-pay

## 2023-04-24 ENCOUNTER — Ambulatory Visit
Admission: RE | Admit: 2023-04-24 | Discharge: 2023-04-24 | Disposition: A | Discharge status: Home / Self Care | Payer: Medicare HMO | Source: Ambulatory Visit | Attending: Radiation Oncology | Admitting: Radiation Oncology

## 2023-04-24 DIAGNOSIS — I350 Nonrheumatic aortic (valve) stenosis: Secondary | ICD-10-CM | POA: Diagnosis not present

## 2023-04-24 DIAGNOSIS — I420 Dilated cardiomyopathy: Secondary | ICD-10-CM | POA: Diagnosis not present

## 2023-04-24 DIAGNOSIS — I110 Hypertensive heart disease with heart failure: Secondary | ICD-10-CM | POA: Diagnosis not present

## 2023-04-24 DIAGNOSIS — I2510 Atherosclerotic heart disease of native coronary artery without angina pectoris: Secondary | ICD-10-CM | POA: Diagnosis not present

## 2023-04-24 DIAGNOSIS — E785 Hyperlipidemia, unspecified: Secondary | ICD-10-CM | POA: Diagnosis not present

## 2023-04-24 DIAGNOSIS — J45909 Unspecified asthma, uncomplicated: Secondary | ICD-10-CM | POA: Diagnosis not present

## 2023-04-24 DIAGNOSIS — E1140 Type 2 diabetes mellitus with diabetic neuropathy, unspecified: Secondary | ICD-10-CM | POA: Diagnosis not present

## 2023-04-24 DIAGNOSIS — C023 Malignant neoplasm of anterior two-thirds of tongue, part unspecified: Secondary | ICD-10-CM | POA: Diagnosis not present

## 2023-04-24 DIAGNOSIS — I480 Paroxysmal atrial fibrillation: Secondary | ICD-10-CM | POA: Diagnosis not present

## 2023-04-24 DIAGNOSIS — Z510 Encounter for antineoplastic radiation therapy: Secondary | ICD-10-CM | POA: Diagnosis not present

## 2023-04-24 LAB — RAD ONC ARIA SESSION SUMMARY
Course Elapsed Days: 3
Plan Fractions Treated to Date: 4
Plan Prescribed Dose Per Fraction: 2 Gy
Plan Total Fractions Prescribed: 30
Plan Total Prescribed Dose: 60 Gy
Reference Point Dosage Given to Date: 8 Gy
Reference Point Session Dosage Given: 2 Gy
Session Number: 4

## 2023-04-25 ENCOUNTER — Other Ambulatory Visit: Payer: Self-pay

## 2023-04-25 ENCOUNTER — Ambulatory Visit
Admission: RE | Admit: 2023-04-25 | Discharge: 2023-04-25 | Disposition: A | Discharge status: Home / Self Care | Payer: Medicare HMO | Source: Ambulatory Visit | Attending: Radiation Oncology | Admitting: Radiation Oncology

## 2023-04-25 DIAGNOSIS — I420 Dilated cardiomyopathy: Secondary | ICD-10-CM | POA: Diagnosis not present

## 2023-04-25 DIAGNOSIS — Z510 Encounter for antineoplastic radiation therapy: Secondary | ICD-10-CM | POA: Diagnosis not present

## 2023-04-25 DIAGNOSIS — I350 Nonrheumatic aortic (valve) stenosis: Secondary | ICD-10-CM | POA: Diagnosis not present

## 2023-04-25 DIAGNOSIS — I480 Paroxysmal atrial fibrillation: Secondary | ICD-10-CM | POA: Diagnosis not present

## 2023-04-25 DIAGNOSIS — C023 Malignant neoplasm of anterior two-thirds of tongue, part unspecified: Secondary | ICD-10-CM | POA: Diagnosis not present

## 2023-04-25 DIAGNOSIS — J45909 Unspecified asthma, uncomplicated: Secondary | ICD-10-CM | POA: Diagnosis not present

## 2023-04-25 DIAGNOSIS — I2510 Atherosclerotic heart disease of native coronary artery without angina pectoris: Secondary | ICD-10-CM | POA: Diagnosis not present

## 2023-04-25 DIAGNOSIS — I110 Hypertensive heart disease with heart failure: Secondary | ICD-10-CM | POA: Diagnosis not present

## 2023-04-25 DIAGNOSIS — E1140 Type 2 diabetes mellitus with diabetic neuropathy, unspecified: Secondary | ICD-10-CM | POA: Diagnosis not present

## 2023-04-25 DIAGNOSIS — E785 Hyperlipidemia, unspecified: Secondary | ICD-10-CM | POA: Diagnosis not present

## 2023-04-25 LAB — RAD ONC ARIA SESSION SUMMARY
Course Elapsed Days: 4
Plan Fractions Treated to Date: 5
Plan Prescribed Dose Per Fraction: 2 Gy
Plan Total Fractions Prescribed: 30
Plan Total Prescribed Dose: 60 Gy
Reference Point Dosage Given to Date: 10 Gy
Reference Point Session Dosage Given: 2 Gy
Session Number: 5

## 2023-04-28 ENCOUNTER — Other Ambulatory Visit: Payer: Self-pay

## 2023-04-28 ENCOUNTER — Ambulatory Visit
Admission: RE | Admit: 2023-04-28 | Discharge: 2023-04-28 | Disposition: A | Discharge status: Home / Self Care | Payer: Medicare HMO | Source: Ambulatory Visit | Attending: Radiation Oncology | Admitting: Radiation Oncology

## 2023-04-28 DIAGNOSIS — E785 Hyperlipidemia, unspecified: Secondary | ICD-10-CM | POA: Diagnosis not present

## 2023-04-28 DIAGNOSIS — C023 Malignant neoplasm of anterior two-thirds of tongue, part unspecified: Secondary | ICD-10-CM | POA: Diagnosis not present

## 2023-04-28 DIAGNOSIS — I350 Nonrheumatic aortic (valve) stenosis: Secondary | ICD-10-CM | POA: Diagnosis not present

## 2023-04-28 DIAGNOSIS — E1140 Type 2 diabetes mellitus with diabetic neuropathy, unspecified: Secondary | ICD-10-CM | POA: Diagnosis not present

## 2023-04-28 DIAGNOSIS — I110 Hypertensive heart disease with heart failure: Secondary | ICD-10-CM | POA: Diagnosis not present

## 2023-04-28 DIAGNOSIS — J45909 Unspecified asthma, uncomplicated: Secondary | ICD-10-CM | POA: Diagnosis not present

## 2023-04-28 DIAGNOSIS — Z510 Encounter for antineoplastic radiation therapy: Secondary | ICD-10-CM | POA: Diagnosis not present

## 2023-04-28 DIAGNOSIS — I2510 Atherosclerotic heart disease of native coronary artery without angina pectoris: Secondary | ICD-10-CM | POA: Diagnosis not present

## 2023-04-28 DIAGNOSIS — I480 Paroxysmal atrial fibrillation: Secondary | ICD-10-CM | POA: Diagnosis not present

## 2023-04-28 DIAGNOSIS — I420 Dilated cardiomyopathy: Secondary | ICD-10-CM | POA: Diagnosis not present

## 2023-04-28 LAB — RAD ONC ARIA SESSION SUMMARY
Course Elapsed Days: 7
Plan Fractions Treated to Date: 6
Plan Prescribed Dose Per Fraction: 2 Gy
Plan Total Fractions Prescribed: 30
Plan Total Prescribed Dose: 60 Gy
Reference Point Dosage Given to Date: 12 Gy
Reference Point Session Dosage Given: 2 Gy
Session Number: 6

## 2023-04-29 ENCOUNTER — Other Ambulatory Visit: Payer: Self-pay

## 2023-04-29 ENCOUNTER — Ambulatory Visit
Admission: RE | Admit: 2023-04-29 | Discharge: 2023-04-29 | Disposition: A | Discharge status: Home / Self Care | Payer: Medicare HMO | Source: Ambulatory Visit | Attending: Radiation Oncology | Admitting: Radiation Oncology

## 2023-04-29 DIAGNOSIS — I110 Hypertensive heart disease with heart failure: Secondary | ICD-10-CM | POA: Diagnosis not present

## 2023-04-29 DIAGNOSIS — Z510 Encounter for antineoplastic radiation therapy: Secondary | ICD-10-CM | POA: Diagnosis not present

## 2023-04-29 DIAGNOSIS — C023 Malignant neoplasm of anterior two-thirds of tongue, part unspecified: Secondary | ICD-10-CM | POA: Diagnosis not present

## 2023-04-29 DIAGNOSIS — E785 Hyperlipidemia, unspecified: Secondary | ICD-10-CM | POA: Diagnosis not present

## 2023-04-29 DIAGNOSIS — J45909 Unspecified asthma, uncomplicated: Secondary | ICD-10-CM | POA: Diagnosis not present

## 2023-04-29 DIAGNOSIS — I350 Nonrheumatic aortic (valve) stenosis: Secondary | ICD-10-CM | POA: Diagnosis not present

## 2023-04-29 DIAGNOSIS — I2510 Atherosclerotic heart disease of native coronary artery without angina pectoris: Secondary | ICD-10-CM | POA: Diagnosis not present

## 2023-04-29 DIAGNOSIS — I420 Dilated cardiomyopathy: Secondary | ICD-10-CM | POA: Diagnosis not present

## 2023-04-29 DIAGNOSIS — I480 Paroxysmal atrial fibrillation: Secondary | ICD-10-CM | POA: Diagnosis not present

## 2023-04-29 DIAGNOSIS — E1140 Type 2 diabetes mellitus with diabetic neuropathy, unspecified: Secondary | ICD-10-CM | POA: Diagnosis not present

## 2023-04-29 LAB — RAD ONC ARIA SESSION SUMMARY
Course Elapsed Days: 8
Plan Fractions Treated to Date: 7
Plan Prescribed Dose Per Fraction: 2 Gy
Plan Total Fractions Prescribed: 30
Plan Total Prescribed Dose: 60 Gy
Reference Point Dosage Given to Date: 14 Gy
Reference Point Session Dosage Given: 2 Gy
Session Number: 7

## 2023-04-30 ENCOUNTER — Ambulatory Visit
Admission: RE | Admit: 2023-04-30 | Discharge: 2023-04-30 | Disposition: A | Discharge status: Home / Self Care | Payer: Medicare HMO | Source: Ambulatory Visit | Attending: Radiation Oncology | Admitting: Radiation Oncology

## 2023-04-30 ENCOUNTER — Inpatient Hospital Stay: Payer: Medicare HMO

## 2023-04-30 ENCOUNTER — Other Ambulatory Visit: Payer: Self-pay

## 2023-04-30 DIAGNOSIS — Z510 Encounter for antineoplastic radiation therapy: Secondary | ICD-10-CM | POA: Diagnosis not present

## 2023-04-30 DIAGNOSIS — I480 Paroxysmal atrial fibrillation: Secondary | ICD-10-CM | POA: Diagnosis not present

## 2023-04-30 DIAGNOSIS — C023 Malignant neoplasm of anterior two-thirds of tongue, part unspecified: Secondary | ICD-10-CM | POA: Diagnosis not present

## 2023-04-30 DIAGNOSIS — J45909 Unspecified asthma, uncomplicated: Secondary | ICD-10-CM | POA: Diagnosis not present

## 2023-04-30 DIAGNOSIS — I2510 Atherosclerotic heart disease of native coronary artery without angina pectoris: Secondary | ICD-10-CM | POA: Diagnosis not present

## 2023-04-30 DIAGNOSIS — E1140 Type 2 diabetes mellitus with diabetic neuropathy, unspecified: Secondary | ICD-10-CM | POA: Diagnosis not present

## 2023-04-30 DIAGNOSIS — I110 Hypertensive heart disease with heart failure: Secondary | ICD-10-CM | POA: Diagnosis not present

## 2023-04-30 DIAGNOSIS — I350 Nonrheumatic aortic (valve) stenosis: Secondary | ICD-10-CM | POA: Diagnosis not present

## 2023-04-30 DIAGNOSIS — E785 Hyperlipidemia, unspecified: Secondary | ICD-10-CM | POA: Diagnosis not present

## 2023-04-30 DIAGNOSIS — I420 Dilated cardiomyopathy: Secondary | ICD-10-CM | POA: Diagnosis not present

## 2023-04-30 LAB — RAD ONC ARIA SESSION SUMMARY
Course Elapsed Days: 9
Plan Fractions Treated to Date: 8
Plan Prescribed Dose Per Fraction: 2 Gy
Plan Total Fractions Prescribed: 30
Plan Total Prescribed Dose: 60 Gy
Reference Point Dosage Given to Date: 16 Gy
Reference Point Session Dosage Given: 2 Gy
Session Number: 8

## 2023-05-01 ENCOUNTER — Ambulatory Visit
Admission: RE | Admit: 2023-05-01 | Discharge: 2023-05-01 | Disposition: A | Discharge status: Home / Self Care | Payer: Medicare HMO | Source: Ambulatory Visit | Attending: Radiation Oncology | Admitting: Radiation Oncology

## 2023-05-01 ENCOUNTER — Other Ambulatory Visit: Payer: Self-pay

## 2023-05-01 DIAGNOSIS — I420 Dilated cardiomyopathy: Secondary | ICD-10-CM | POA: Diagnosis not present

## 2023-05-01 DIAGNOSIS — I480 Paroxysmal atrial fibrillation: Secondary | ICD-10-CM | POA: Diagnosis not present

## 2023-05-01 DIAGNOSIS — C023 Malignant neoplasm of anterior two-thirds of tongue, part unspecified: Secondary | ICD-10-CM | POA: Diagnosis not present

## 2023-05-01 DIAGNOSIS — J45909 Unspecified asthma, uncomplicated: Secondary | ICD-10-CM | POA: Diagnosis not present

## 2023-05-01 DIAGNOSIS — E1140 Type 2 diabetes mellitus with diabetic neuropathy, unspecified: Secondary | ICD-10-CM | POA: Diagnosis not present

## 2023-05-01 DIAGNOSIS — I2510 Atherosclerotic heart disease of native coronary artery without angina pectoris: Secondary | ICD-10-CM | POA: Diagnosis not present

## 2023-05-01 DIAGNOSIS — Z510 Encounter for antineoplastic radiation therapy: Secondary | ICD-10-CM | POA: Diagnosis not present

## 2023-05-01 DIAGNOSIS — E785 Hyperlipidemia, unspecified: Secondary | ICD-10-CM | POA: Diagnosis not present

## 2023-05-01 DIAGNOSIS — I350 Nonrheumatic aortic (valve) stenosis: Secondary | ICD-10-CM | POA: Diagnosis not present

## 2023-05-01 DIAGNOSIS — I110 Hypertensive heart disease with heart failure: Secondary | ICD-10-CM | POA: Diagnosis not present

## 2023-05-01 LAB — RAD ONC ARIA SESSION SUMMARY
Course Elapsed Days: 10
Plan Fractions Treated to Date: 9
Plan Prescribed Dose Per Fraction: 2 Gy
Plan Total Fractions Prescribed: 30
Plan Total Prescribed Dose: 60 Gy
Reference Point Dosage Given to Date: 18 Gy
Reference Point Session Dosage Given: 2 Gy
Session Number: 9

## 2023-05-02 ENCOUNTER — Other Ambulatory Visit: Payer: Self-pay

## 2023-05-02 ENCOUNTER — Ambulatory Visit
Admission: RE | Admit: 2023-05-02 | Discharge: 2023-05-02 | Disposition: A | Discharge status: Home / Self Care | Payer: Medicare HMO | Source: Ambulatory Visit | Attending: Radiation Oncology | Admitting: Radiation Oncology

## 2023-05-02 DIAGNOSIS — C023 Malignant neoplasm of anterior two-thirds of tongue, part unspecified: Secondary | ICD-10-CM | POA: Diagnosis not present

## 2023-05-02 DIAGNOSIS — Z510 Encounter for antineoplastic radiation therapy: Secondary | ICD-10-CM | POA: Diagnosis not present

## 2023-05-02 DIAGNOSIS — I110 Hypertensive heart disease with heart failure: Secondary | ICD-10-CM | POA: Diagnosis not present

## 2023-05-02 DIAGNOSIS — J45909 Unspecified asthma, uncomplicated: Secondary | ICD-10-CM | POA: Diagnosis not present

## 2023-05-02 DIAGNOSIS — I350 Nonrheumatic aortic (valve) stenosis: Secondary | ICD-10-CM | POA: Diagnosis not present

## 2023-05-02 DIAGNOSIS — E1140 Type 2 diabetes mellitus with diabetic neuropathy, unspecified: Secondary | ICD-10-CM | POA: Diagnosis not present

## 2023-05-02 DIAGNOSIS — E785 Hyperlipidemia, unspecified: Secondary | ICD-10-CM | POA: Diagnosis not present

## 2023-05-02 DIAGNOSIS — I420 Dilated cardiomyopathy: Secondary | ICD-10-CM | POA: Diagnosis not present

## 2023-05-02 DIAGNOSIS — I480 Paroxysmal atrial fibrillation: Secondary | ICD-10-CM | POA: Diagnosis not present

## 2023-05-02 DIAGNOSIS — I2510 Atherosclerotic heart disease of native coronary artery without angina pectoris: Secondary | ICD-10-CM | POA: Diagnosis not present

## 2023-05-02 LAB — RAD ONC ARIA SESSION SUMMARY
Course Elapsed Days: 11
Plan Fractions Treated to Date: 10
Plan Prescribed Dose Per Fraction: 2 Gy
Plan Total Fractions Prescribed: 30
Plan Total Prescribed Dose: 60 Gy
Reference Point Dosage Given to Date: 20 Gy
Reference Point Session Dosage Given: 2 Gy
Session Number: 10

## 2023-05-06 ENCOUNTER — Other Ambulatory Visit: Payer: Self-pay

## 2023-05-06 ENCOUNTER — Ambulatory Visit
Admission: RE | Admit: 2023-05-06 | Discharge: 2023-05-06 | Disposition: A | Discharge status: Home / Self Care | Payer: Medicare HMO | Source: Ambulatory Visit | Attending: Radiation Oncology | Admitting: Radiation Oncology

## 2023-05-06 DIAGNOSIS — I480 Paroxysmal atrial fibrillation: Secondary | ICD-10-CM | POA: Diagnosis not present

## 2023-05-06 DIAGNOSIS — I420 Dilated cardiomyopathy: Secondary | ICD-10-CM | POA: Diagnosis not present

## 2023-05-06 DIAGNOSIS — I2510 Atherosclerotic heart disease of native coronary artery without angina pectoris: Secondary | ICD-10-CM | POA: Diagnosis not present

## 2023-05-06 DIAGNOSIS — E1140 Type 2 diabetes mellitus with diabetic neuropathy, unspecified: Secondary | ICD-10-CM | POA: Diagnosis not present

## 2023-05-06 DIAGNOSIS — C023 Malignant neoplasm of anterior two-thirds of tongue, part unspecified: Secondary | ICD-10-CM | POA: Diagnosis not present

## 2023-05-06 DIAGNOSIS — J45909 Unspecified asthma, uncomplicated: Secondary | ICD-10-CM | POA: Diagnosis not present

## 2023-05-06 DIAGNOSIS — Z510 Encounter for antineoplastic radiation therapy: Secondary | ICD-10-CM | POA: Diagnosis not present

## 2023-05-06 DIAGNOSIS — E785 Hyperlipidemia, unspecified: Secondary | ICD-10-CM | POA: Diagnosis not present

## 2023-05-06 DIAGNOSIS — I110 Hypertensive heart disease with heart failure: Secondary | ICD-10-CM | POA: Diagnosis not present

## 2023-05-06 DIAGNOSIS — I350 Nonrheumatic aortic (valve) stenosis: Secondary | ICD-10-CM | POA: Diagnosis not present

## 2023-05-06 LAB — RAD ONC ARIA SESSION SUMMARY
Course Elapsed Days: 15
Plan Fractions Treated to Date: 11
Plan Prescribed Dose Per Fraction: 2 Gy
Plan Total Fractions Prescribed: 30
Plan Total Prescribed Dose: 60 Gy
Reference Point Dosage Given to Date: 22 Gy
Reference Point Session Dosage Given: 2 Gy
Session Number: 11

## 2023-05-07 ENCOUNTER — Ambulatory Visit
Admission: RE | Admit: 2023-05-07 | Discharge: 2023-05-07 | Disposition: A | Discharge status: Home / Self Care | Payer: Medicare HMO | Source: Ambulatory Visit | Attending: Radiation Oncology | Admitting: Radiation Oncology

## 2023-05-07 ENCOUNTER — Inpatient Hospital Stay: Payer: Medicare HMO

## 2023-05-07 ENCOUNTER — Other Ambulatory Visit: Payer: Self-pay

## 2023-05-07 DIAGNOSIS — I350 Nonrheumatic aortic (valve) stenosis: Secondary | ICD-10-CM | POA: Diagnosis not present

## 2023-05-07 DIAGNOSIS — I480 Paroxysmal atrial fibrillation: Secondary | ICD-10-CM | POA: Diagnosis not present

## 2023-05-07 DIAGNOSIS — C023 Malignant neoplasm of anterior two-thirds of tongue, part unspecified: Secondary | ICD-10-CM | POA: Diagnosis not present

## 2023-05-07 DIAGNOSIS — E785 Hyperlipidemia, unspecified: Secondary | ICD-10-CM | POA: Diagnosis not present

## 2023-05-07 DIAGNOSIS — Z510 Encounter for antineoplastic radiation therapy: Secondary | ICD-10-CM | POA: Diagnosis not present

## 2023-05-07 DIAGNOSIS — J45909 Unspecified asthma, uncomplicated: Secondary | ICD-10-CM | POA: Diagnosis not present

## 2023-05-07 DIAGNOSIS — I420 Dilated cardiomyopathy: Secondary | ICD-10-CM | POA: Diagnosis not present

## 2023-05-07 DIAGNOSIS — I110 Hypertensive heart disease with heart failure: Secondary | ICD-10-CM | POA: Diagnosis not present

## 2023-05-07 DIAGNOSIS — I2510 Atherosclerotic heart disease of native coronary artery without angina pectoris: Secondary | ICD-10-CM | POA: Diagnosis not present

## 2023-05-07 DIAGNOSIS — E1140 Type 2 diabetes mellitus with diabetic neuropathy, unspecified: Secondary | ICD-10-CM | POA: Diagnosis not present

## 2023-05-07 LAB — RAD ONC ARIA SESSION SUMMARY
Course Elapsed Days: 16
Plan Fractions Treated to Date: 12
Plan Prescribed Dose Per Fraction: 2 Gy
Plan Total Fractions Prescribed: 30
Plan Total Prescribed Dose: 60 Gy
Reference Point Dosage Given to Date: 24 Gy
Reference Point Session Dosage Given: 2 Gy
Session Number: 12

## 2023-05-08 ENCOUNTER — Ambulatory Visit
Admission: RE | Admit: 2023-05-08 | Discharge: 2023-05-08 | Disposition: A | Discharge status: Home / Self Care | Payer: Medicare HMO | Source: Ambulatory Visit | Attending: Radiation Oncology | Admitting: Radiation Oncology

## 2023-05-08 ENCOUNTER — Other Ambulatory Visit: Payer: Self-pay

## 2023-05-08 DIAGNOSIS — I110 Hypertensive heart disease with heart failure: Secondary | ICD-10-CM | POA: Diagnosis not present

## 2023-05-08 DIAGNOSIS — C023 Malignant neoplasm of anterior two-thirds of tongue, part unspecified: Secondary | ICD-10-CM | POA: Diagnosis not present

## 2023-05-08 DIAGNOSIS — Z510 Encounter for antineoplastic radiation therapy: Secondary | ICD-10-CM | POA: Diagnosis not present

## 2023-05-08 DIAGNOSIS — E1140 Type 2 diabetes mellitus with diabetic neuropathy, unspecified: Secondary | ICD-10-CM | POA: Diagnosis not present

## 2023-05-08 DIAGNOSIS — I350 Nonrheumatic aortic (valve) stenosis: Secondary | ICD-10-CM | POA: Diagnosis not present

## 2023-05-08 DIAGNOSIS — I2510 Atherosclerotic heart disease of native coronary artery without angina pectoris: Secondary | ICD-10-CM | POA: Diagnosis not present

## 2023-05-08 DIAGNOSIS — I480 Paroxysmal atrial fibrillation: Secondary | ICD-10-CM | POA: Diagnosis not present

## 2023-05-08 DIAGNOSIS — I420 Dilated cardiomyopathy: Secondary | ICD-10-CM | POA: Diagnosis not present

## 2023-05-08 DIAGNOSIS — E785 Hyperlipidemia, unspecified: Secondary | ICD-10-CM | POA: Diagnosis not present

## 2023-05-08 DIAGNOSIS — J45909 Unspecified asthma, uncomplicated: Secondary | ICD-10-CM | POA: Diagnosis not present

## 2023-05-08 LAB — RAD ONC ARIA SESSION SUMMARY
Course Elapsed Days: 17
Plan Fractions Treated to Date: 13
Plan Prescribed Dose Per Fraction: 2 Gy
Plan Total Fractions Prescribed: 30
Plan Total Prescribed Dose: 60 Gy
Reference Point Dosage Given to Date: 26 Gy
Reference Point Session Dosage Given: 2 Gy
Session Number: 13

## 2023-05-09 ENCOUNTER — Other Ambulatory Visit: Payer: Self-pay

## 2023-05-09 ENCOUNTER — Ambulatory Visit
Admission: RE | Admit: 2023-05-09 | Discharge: 2023-05-09 | Disposition: A | Discharge status: Home / Self Care | Payer: Medicare HMO | Source: Ambulatory Visit | Attending: Radiation Oncology | Admitting: Radiation Oncology

## 2023-05-09 DIAGNOSIS — I350 Nonrheumatic aortic (valve) stenosis: Secondary | ICD-10-CM | POA: Diagnosis not present

## 2023-05-09 DIAGNOSIS — I420 Dilated cardiomyopathy: Secondary | ICD-10-CM | POA: Diagnosis not present

## 2023-05-09 DIAGNOSIS — E1140 Type 2 diabetes mellitus with diabetic neuropathy, unspecified: Secondary | ICD-10-CM | POA: Diagnosis not present

## 2023-05-09 DIAGNOSIS — E785 Hyperlipidemia, unspecified: Secondary | ICD-10-CM | POA: Diagnosis not present

## 2023-05-09 DIAGNOSIS — I2510 Atherosclerotic heart disease of native coronary artery without angina pectoris: Secondary | ICD-10-CM | POA: Diagnosis not present

## 2023-05-09 DIAGNOSIS — I110 Hypertensive heart disease with heart failure: Secondary | ICD-10-CM | POA: Diagnosis not present

## 2023-05-09 DIAGNOSIS — C023 Malignant neoplasm of anterior two-thirds of tongue, part unspecified: Secondary | ICD-10-CM | POA: Diagnosis not present

## 2023-05-09 DIAGNOSIS — J45909 Unspecified asthma, uncomplicated: Secondary | ICD-10-CM | POA: Diagnosis not present

## 2023-05-09 DIAGNOSIS — Z510 Encounter for antineoplastic radiation therapy: Secondary | ICD-10-CM | POA: Diagnosis not present

## 2023-05-09 DIAGNOSIS — I480 Paroxysmal atrial fibrillation: Secondary | ICD-10-CM | POA: Diagnosis not present

## 2023-05-09 LAB — RAD ONC ARIA SESSION SUMMARY
Course Elapsed Days: 18
Plan Fractions Treated to Date: 14
Plan Prescribed Dose Per Fraction: 2 Gy
Plan Total Fractions Prescribed: 30
Plan Total Prescribed Dose: 60 Gy
Reference Point Dosage Given to Date: 28 Gy
Reference Point Session Dosage Given: 2 Gy
Session Number: 14

## 2023-05-12 ENCOUNTER — Ambulatory Visit
Admission: RE | Admit: 2023-05-12 | Discharge: 2023-05-12 | Disposition: A | Discharge status: Home / Self Care | Payer: Medicare HMO | Source: Ambulatory Visit | Attending: Radiation Oncology | Admitting: Radiation Oncology

## 2023-05-12 ENCOUNTER — Other Ambulatory Visit: Payer: Self-pay

## 2023-05-12 DIAGNOSIS — E785 Hyperlipidemia, unspecified: Secondary | ICD-10-CM | POA: Insufficient documentation

## 2023-05-12 DIAGNOSIS — Z87891 Personal history of nicotine dependence: Secondary | ICD-10-CM | POA: Insufficient documentation

## 2023-05-12 DIAGNOSIS — Z8673 Personal history of transient ischemic attack (TIA), and cerebral infarction without residual deficits: Secondary | ICD-10-CM | POA: Diagnosis not present

## 2023-05-12 DIAGNOSIS — Z85828 Personal history of other malignant neoplasm of skin: Secondary | ICD-10-CM | POA: Insufficient documentation

## 2023-05-12 DIAGNOSIS — E1140 Type 2 diabetes mellitus with diabetic neuropathy, unspecified: Secondary | ICD-10-CM | POA: Insufficient documentation

## 2023-05-12 DIAGNOSIS — I110 Hypertensive heart disease with heart failure: Secondary | ICD-10-CM | POA: Diagnosis not present

## 2023-05-12 DIAGNOSIS — I5022 Chronic systolic (congestive) heart failure: Secondary | ICD-10-CM | POA: Diagnosis not present

## 2023-05-12 DIAGNOSIS — I2510 Atherosclerotic heart disease of native coronary artery without angina pectoris: Secondary | ICD-10-CM | POA: Diagnosis not present

## 2023-05-12 DIAGNOSIS — I420 Dilated cardiomyopathy: Secondary | ICD-10-CM | POA: Insufficient documentation

## 2023-05-12 DIAGNOSIS — I350 Nonrheumatic aortic (valve) stenosis: Secondary | ICD-10-CM | POA: Diagnosis not present

## 2023-05-12 DIAGNOSIS — Z87442 Personal history of urinary calculi: Secondary | ICD-10-CM | POA: Insufficient documentation

## 2023-05-12 DIAGNOSIS — C023 Malignant neoplasm of anterior two-thirds of tongue, part unspecified: Secondary | ICD-10-CM | POA: Insufficient documentation

## 2023-05-12 DIAGNOSIS — Z952 Presence of prosthetic heart valve: Secondary | ICD-10-CM | POA: Insufficient documentation

## 2023-05-12 DIAGNOSIS — I480 Paroxysmal atrial fibrillation: Secondary | ICD-10-CM | POA: Diagnosis not present

## 2023-05-12 DIAGNOSIS — Z7984 Long term (current) use of oral hypoglycemic drugs: Secondary | ICD-10-CM | POA: Insufficient documentation

## 2023-05-12 DIAGNOSIS — J45909 Unspecified asthma, uncomplicated: Secondary | ICD-10-CM | POA: Diagnosis not present

## 2023-05-12 DIAGNOSIS — Z8719 Personal history of other diseases of the digestive system: Secondary | ICD-10-CM | POA: Diagnosis not present

## 2023-05-12 DIAGNOSIS — Z794 Long term (current) use of insulin: Secondary | ICD-10-CM | POA: Insufficient documentation

## 2023-05-12 DIAGNOSIS — Z510 Encounter for antineoplastic radiation therapy: Secondary | ICD-10-CM | POA: Diagnosis not present

## 2023-05-12 DIAGNOSIS — Z7901 Long term (current) use of anticoagulants: Secondary | ICD-10-CM | POA: Insufficient documentation

## 2023-05-12 DIAGNOSIS — Z79899 Other long term (current) drug therapy: Secondary | ICD-10-CM | POA: Diagnosis not present

## 2023-05-12 LAB — RAD ONC ARIA SESSION SUMMARY
Course Elapsed Days: 21
Plan Fractions Treated to Date: 15
Plan Prescribed Dose Per Fraction: 2 Gy
Plan Total Fractions Prescribed: 30
Plan Total Prescribed Dose: 60 Gy
Reference Point Dosage Given to Date: 30 Gy
Reference Point Session Dosage Given: 2 Gy
Session Number: 15

## 2023-05-13 ENCOUNTER — Ambulatory Visit
Admission: RE | Admit: 2023-05-13 | Discharge: 2023-05-13 | Disposition: A | Discharge status: Home / Self Care | Payer: Medicare HMO | Source: Ambulatory Visit | Attending: Radiation Oncology | Admitting: Radiation Oncology

## 2023-05-13 ENCOUNTER — Other Ambulatory Visit: Payer: Self-pay

## 2023-05-13 DIAGNOSIS — I350 Nonrheumatic aortic (valve) stenosis: Secondary | ICD-10-CM | POA: Diagnosis not present

## 2023-05-13 DIAGNOSIS — I2510 Atherosclerotic heart disease of native coronary artery without angina pectoris: Secondary | ICD-10-CM | POA: Diagnosis not present

## 2023-05-13 DIAGNOSIS — I420 Dilated cardiomyopathy: Secondary | ICD-10-CM | POA: Diagnosis not present

## 2023-05-13 DIAGNOSIS — Z510 Encounter for antineoplastic radiation therapy: Secondary | ICD-10-CM | POA: Diagnosis not present

## 2023-05-13 DIAGNOSIS — I480 Paroxysmal atrial fibrillation: Secondary | ICD-10-CM | POA: Diagnosis not present

## 2023-05-13 DIAGNOSIS — C023 Malignant neoplasm of anterior two-thirds of tongue, part unspecified: Secondary | ICD-10-CM | POA: Diagnosis not present

## 2023-05-13 DIAGNOSIS — J45909 Unspecified asthma, uncomplicated: Secondary | ICD-10-CM | POA: Diagnosis not present

## 2023-05-13 DIAGNOSIS — I110 Hypertensive heart disease with heart failure: Secondary | ICD-10-CM | POA: Diagnosis not present

## 2023-05-13 DIAGNOSIS — E1140 Type 2 diabetes mellitus with diabetic neuropathy, unspecified: Secondary | ICD-10-CM | POA: Diagnosis not present

## 2023-05-13 DIAGNOSIS — E785 Hyperlipidemia, unspecified: Secondary | ICD-10-CM | POA: Diagnosis not present

## 2023-05-13 LAB — RAD ONC ARIA SESSION SUMMARY
Course Elapsed Days: 22
Plan Fractions Treated to Date: 16
Plan Prescribed Dose Per Fraction: 2 Gy
Plan Total Fractions Prescribed: 30
Plan Total Prescribed Dose: 60 Gy
Reference Point Dosage Given to Date: 32 Gy
Reference Point Session Dosage Given: 2 Gy
Session Number: 16

## 2023-05-14 ENCOUNTER — Other Ambulatory Visit: Payer: Self-pay

## 2023-05-14 ENCOUNTER — Ambulatory Visit
Admission: RE | Admit: 2023-05-14 | Discharge: 2023-05-14 | Disposition: A | Discharge status: Home / Self Care | Payer: Medicare HMO | Source: Ambulatory Visit | Attending: Radiation Oncology | Admitting: Radiation Oncology

## 2023-05-14 ENCOUNTER — Inpatient Hospital Stay: Payer: Medicare HMO | Attending: Family Medicine

## 2023-05-14 DIAGNOSIS — J45909 Unspecified asthma, uncomplicated: Secondary | ICD-10-CM | POA: Diagnosis not present

## 2023-05-14 DIAGNOSIS — E1140 Type 2 diabetes mellitus with diabetic neuropathy, unspecified: Secondary | ICD-10-CM | POA: Diagnosis not present

## 2023-05-14 DIAGNOSIS — I350 Nonrheumatic aortic (valve) stenosis: Secondary | ICD-10-CM | POA: Diagnosis not present

## 2023-05-14 DIAGNOSIS — Z510 Encounter for antineoplastic radiation therapy: Secondary | ICD-10-CM | POA: Diagnosis not present

## 2023-05-14 DIAGNOSIS — I2510 Atherosclerotic heart disease of native coronary artery without angina pectoris: Secondary | ICD-10-CM | POA: Diagnosis not present

## 2023-05-14 DIAGNOSIS — I110 Hypertensive heart disease with heart failure: Secondary | ICD-10-CM | POA: Diagnosis not present

## 2023-05-14 DIAGNOSIS — C023 Malignant neoplasm of anterior two-thirds of tongue, part unspecified: Secondary | ICD-10-CM | POA: Diagnosis not present

## 2023-05-14 DIAGNOSIS — I420 Dilated cardiomyopathy: Secondary | ICD-10-CM | POA: Diagnosis not present

## 2023-05-14 DIAGNOSIS — E785 Hyperlipidemia, unspecified: Secondary | ICD-10-CM | POA: Diagnosis not present

## 2023-05-14 DIAGNOSIS — I480 Paroxysmal atrial fibrillation: Secondary | ICD-10-CM | POA: Diagnosis not present

## 2023-05-14 LAB — RAD ONC ARIA SESSION SUMMARY
Course Elapsed Days: 23
Plan Fractions Treated to Date: 17
Plan Prescribed Dose Per Fraction: 2 Gy
Plan Total Fractions Prescribed: 30
Plan Total Prescribed Dose: 60 Gy
Reference Point Dosage Given to Date: 34 Gy
Reference Point Session Dosage Given: 2 Gy
Session Number: 17

## 2023-05-15 ENCOUNTER — Ambulatory Visit
Admission: RE | Admit: 2023-05-15 | Discharge: 2023-05-15 | Disposition: A | Discharge status: Home / Self Care | Payer: Medicare HMO | Source: Ambulatory Visit | Attending: Radiation Oncology | Admitting: Radiation Oncology

## 2023-05-15 ENCOUNTER — Other Ambulatory Visit: Payer: Self-pay

## 2023-05-15 DIAGNOSIS — I420 Dilated cardiomyopathy: Secondary | ICD-10-CM | POA: Diagnosis not present

## 2023-05-15 DIAGNOSIS — I350 Nonrheumatic aortic (valve) stenosis: Secondary | ICD-10-CM | POA: Diagnosis not present

## 2023-05-15 DIAGNOSIS — J45909 Unspecified asthma, uncomplicated: Secondary | ICD-10-CM | POA: Diagnosis not present

## 2023-05-15 DIAGNOSIS — Z510 Encounter for antineoplastic radiation therapy: Secondary | ICD-10-CM | POA: Diagnosis not present

## 2023-05-15 DIAGNOSIS — E1140 Type 2 diabetes mellitus with diabetic neuropathy, unspecified: Secondary | ICD-10-CM | POA: Diagnosis not present

## 2023-05-15 DIAGNOSIS — I480 Paroxysmal atrial fibrillation: Secondary | ICD-10-CM | POA: Diagnosis not present

## 2023-05-15 DIAGNOSIS — C023 Malignant neoplasm of anterior two-thirds of tongue, part unspecified: Secondary | ICD-10-CM | POA: Diagnosis not present

## 2023-05-15 DIAGNOSIS — I110 Hypertensive heart disease with heart failure: Secondary | ICD-10-CM | POA: Diagnosis not present

## 2023-05-15 DIAGNOSIS — I2510 Atherosclerotic heart disease of native coronary artery without angina pectoris: Secondary | ICD-10-CM | POA: Diagnosis not present

## 2023-05-15 DIAGNOSIS — E785 Hyperlipidemia, unspecified: Secondary | ICD-10-CM | POA: Diagnosis not present

## 2023-05-15 LAB — RAD ONC ARIA SESSION SUMMARY
Course Elapsed Days: 24
Plan Fractions Treated to Date: 18
Plan Prescribed Dose Per Fraction: 2 Gy
Plan Total Fractions Prescribed: 30
Plan Total Prescribed Dose: 60 Gy
Reference Point Dosage Given to Date: 36 Gy
Reference Point Session Dosage Given: 2 Gy
Session Number: 18

## 2023-05-16 ENCOUNTER — Ambulatory Visit
Admission: RE | Admit: 2023-05-16 | Discharge: 2023-05-16 | Disposition: A | Discharge status: Home / Self Care | Payer: Medicare HMO | Source: Ambulatory Visit | Attending: Radiation Oncology | Admitting: Radiation Oncology

## 2023-05-16 ENCOUNTER — Other Ambulatory Visit: Payer: Self-pay

## 2023-05-16 DIAGNOSIS — C023 Malignant neoplasm of anterior two-thirds of tongue, part unspecified: Secondary | ICD-10-CM | POA: Diagnosis not present

## 2023-05-16 DIAGNOSIS — J45909 Unspecified asthma, uncomplicated: Secondary | ICD-10-CM | POA: Diagnosis not present

## 2023-05-16 DIAGNOSIS — E1140 Type 2 diabetes mellitus with diabetic neuropathy, unspecified: Secondary | ICD-10-CM | POA: Diagnosis not present

## 2023-05-16 DIAGNOSIS — I420 Dilated cardiomyopathy: Secondary | ICD-10-CM | POA: Diagnosis not present

## 2023-05-16 DIAGNOSIS — E785 Hyperlipidemia, unspecified: Secondary | ICD-10-CM | POA: Diagnosis not present

## 2023-05-16 DIAGNOSIS — Z510 Encounter for antineoplastic radiation therapy: Secondary | ICD-10-CM | POA: Diagnosis not present

## 2023-05-16 DIAGNOSIS — I350 Nonrheumatic aortic (valve) stenosis: Secondary | ICD-10-CM | POA: Diagnosis not present

## 2023-05-16 DIAGNOSIS — I480 Paroxysmal atrial fibrillation: Secondary | ICD-10-CM | POA: Diagnosis not present

## 2023-05-16 DIAGNOSIS — I2510 Atherosclerotic heart disease of native coronary artery without angina pectoris: Secondary | ICD-10-CM | POA: Diagnosis not present

## 2023-05-16 DIAGNOSIS — I110 Hypertensive heart disease with heart failure: Secondary | ICD-10-CM | POA: Diagnosis not present

## 2023-05-16 LAB — RAD ONC ARIA SESSION SUMMARY
Course Elapsed Days: 25
Plan Fractions Treated to Date: 19
Plan Prescribed Dose Per Fraction: 2 Gy
Plan Total Fractions Prescribed: 30
Plan Total Prescribed Dose: 60 Gy
Reference Point Dosage Given to Date: 38 Gy
Reference Point Session Dosage Given: 2 Gy
Session Number: 19

## 2023-05-19 ENCOUNTER — Ambulatory Visit
Admission: RE | Admit: 2023-05-19 | Discharge: 2023-05-19 | Disposition: A | Discharge status: Home / Self Care | Payer: Medicare HMO | Source: Ambulatory Visit | Attending: Radiation Oncology | Admitting: Radiation Oncology

## 2023-05-19 ENCOUNTER — Other Ambulatory Visit: Payer: Self-pay

## 2023-05-19 DIAGNOSIS — E1140 Type 2 diabetes mellitus with diabetic neuropathy, unspecified: Secondary | ICD-10-CM | POA: Diagnosis not present

## 2023-05-19 DIAGNOSIS — C023 Malignant neoplasm of anterior two-thirds of tongue, part unspecified: Secondary | ICD-10-CM | POA: Diagnosis not present

## 2023-05-19 DIAGNOSIS — I2510 Atherosclerotic heart disease of native coronary artery without angina pectoris: Secondary | ICD-10-CM | POA: Diagnosis not present

## 2023-05-19 DIAGNOSIS — I110 Hypertensive heart disease with heart failure: Secondary | ICD-10-CM | POA: Diagnosis not present

## 2023-05-19 DIAGNOSIS — I350 Nonrheumatic aortic (valve) stenosis: Secondary | ICD-10-CM | POA: Diagnosis not present

## 2023-05-19 DIAGNOSIS — I480 Paroxysmal atrial fibrillation: Secondary | ICD-10-CM | POA: Diagnosis not present

## 2023-05-19 DIAGNOSIS — I420 Dilated cardiomyopathy: Secondary | ICD-10-CM | POA: Diagnosis not present

## 2023-05-19 DIAGNOSIS — E785 Hyperlipidemia, unspecified: Secondary | ICD-10-CM | POA: Diagnosis not present

## 2023-05-19 DIAGNOSIS — J45909 Unspecified asthma, uncomplicated: Secondary | ICD-10-CM | POA: Diagnosis not present

## 2023-05-19 DIAGNOSIS — Z510 Encounter for antineoplastic radiation therapy: Secondary | ICD-10-CM | POA: Diagnosis not present

## 2023-05-19 LAB — RAD ONC ARIA SESSION SUMMARY
Course Elapsed Days: 28
Plan Fractions Treated to Date: 20
Plan Prescribed Dose Per Fraction: 2 Gy
Plan Total Fractions Prescribed: 30
Plan Total Prescribed Dose: 60 Gy
Reference Point Dosage Given to Date: 40 Gy
Reference Point Session Dosage Given: 2 Gy
Session Number: 20

## 2023-05-20 ENCOUNTER — Other Ambulatory Visit: Payer: Self-pay

## 2023-05-20 ENCOUNTER — Ambulatory Visit
Admission: RE | Admit: 2023-05-20 | Discharge: 2023-05-20 | Disposition: A | Discharge status: Home / Self Care | Payer: Medicare HMO | Source: Ambulatory Visit | Attending: Radiation Oncology | Admitting: Radiation Oncology

## 2023-05-20 DIAGNOSIS — I2510 Atherosclerotic heart disease of native coronary artery without angina pectoris: Secondary | ICD-10-CM | POA: Diagnosis not present

## 2023-05-20 DIAGNOSIS — E785 Hyperlipidemia, unspecified: Secondary | ICD-10-CM | POA: Diagnosis not present

## 2023-05-20 DIAGNOSIS — I480 Paroxysmal atrial fibrillation: Secondary | ICD-10-CM | POA: Diagnosis not present

## 2023-05-20 DIAGNOSIS — Z510 Encounter for antineoplastic radiation therapy: Secondary | ICD-10-CM | POA: Diagnosis not present

## 2023-05-20 DIAGNOSIS — I350 Nonrheumatic aortic (valve) stenosis: Secondary | ICD-10-CM | POA: Diagnosis not present

## 2023-05-20 DIAGNOSIS — J45909 Unspecified asthma, uncomplicated: Secondary | ICD-10-CM | POA: Diagnosis not present

## 2023-05-20 DIAGNOSIS — E1140 Type 2 diabetes mellitus with diabetic neuropathy, unspecified: Secondary | ICD-10-CM | POA: Diagnosis not present

## 2023-05-20 DIAGNOSIS — I420 Dilated cardiomyopathy: Secondary | ICD-10-CM | POA: Diagnosis not present

## 2023-05-20 DIAGNOSIS — C023 Malignant neoplasm of anterior two-thirds of tongue, part unspecified: Secondary | ICD-10-CM | POA: Diagnosis not present

## 2023-05-20 DIAGNOSIS — I110 Hypertensive heart disease with heart failure: Secondary | ICD-10-CM | POA: Diagnosis not present

## 2023-05-20 LAB — RAD ONC ARIA SESSION SUMMARY
Course Elapsed Days: 29
Plan Fractions Treated to Date: 21
Plan Prescribed Dose Per Fraction: 2 Gy
Plan Total Fractions Prescribed: 30
Plan Total Prescribed Dose: 60 Gy
Reference Point Dosage Given to Date: 42 Gy
Reference Point Session Dosage Given: 2 Gy
Session Number: 21

## 2023-05-21 ENCOUNTER — Ambulatory Visit
Admission: RE | Admit: 2023-05-21 | Discharge: 2023-05-21 | Disposition: A | Discharge status: Home / Self Care | Payer: Medicare HMO | Source: Ambulatory Visit | Attending: Radiation Oncology | Admitting: Radiation Oncology

## 2023-05-21 ENCOUNTER — Other Ambulatory Visit: Payer: Self-pay

## 2023-05-21 ENCOUNTER — Inpatient Hospital Stay: Payer: Medicare HMO

## 2023-05-21 DIAGNOSIS — I480 Paroxysmal atrial fibrillation: Secondary | ICD-10-CM | POA: Diagnosis not present

## 2023-05-21 DIAGNOSIS — Z510 Encounter for antineoplastic radiation therapy: Secondary | ICD-10-CM | POA: Diagnosis not present

## 2023-05-21 DIAGNOSIS — J45909 Unspecified asthma, uncomplicated: Secondary | ICD-10-CM | POA: Diagnosis not present

## 2023-05-21 DIAGNOSIS — I420 Dilated cardiomyopathy: Secondary | ICD-10-CM | POA: Diagnosis not present

## 2023-05-21 DIAGNOSIS — E1140 Type 2 diabetes mellitus with diabetic neuropathy, unspecified: Secondary | ICD-10-CM | POA: Diagnosis not present

## 2023-05-21 DIAGNOSIS — I350 Nonrheumatic aortic (valve) stenosis: Secondary | ICD-10-CM | POA: Diagnosis not present

## 2023-05-21 DIAGNOSIS — E785 Hyperlipidemia, unspecified: Secondary | ICD-10-CM | POA: Diagnosis not present

## 2023-05-21 DIAGNOSIS — I110 Hypertensive heart disease with heart failure: Secondary | ICD-10-CM | POA: Diagnosis not present

## 2023-05-21 DIAGNOSIS — I2510 Atherosclerotic heart disease of native coronary artery without angina pectoris: Secondary | ICD-10-CM | POA: Diagnosis not present

## 2023-05-21 DIAGNOSIS — C023 Malignant neoplasm of anterior two-thirds of tongue, part unspecified: Secondary | ICD-10-CM | POA: Diagnosis not present

## 2023-05-21 LAB — RAD ONC ARIA SESSION SUMMARY
Course Elapsed Days: 30
Plan Fractions Treated to Date: 22
Plan Prescribed Dose Per Fraction: 2 Gy
Plan Total Fractions Prescribed: 30
Plan Total Prescribed Dose: 60 Gy
Reference Point Dosage Given to Date: 44 Gy
Reference Point Session Dosage Given: 2 Gy
Session Number: 22

## 2023-05-22 ENCOUNTER — Other Ambulatory Visit: Payer: Self-pay

## 2023-05-22 ENCOUNTER — Ambulatory Visit
Admission: RE | Admit: 2023-05-22 | Discharge: 2023-05-22 | Disposition: A | Discharge status: Home / Self Care | Payer: Medicare HMO | Source: Ambulatory Visit | Attending: Radiation Oncology | Admitting: Radiation Oncology

## 2023-05-22 DIAGNOSIS — E1140 Type 2 diabetes mellitus with diabetic neuropathy, unspecified: Secondary | ICD-10-CM | POA: Diagnosis not present

## 2023-05-22 DIAGNOSIS — J45909 Unspecified asthma, uncomplicated: Secondary | ICD-10-CM | POA: Diagnosis not present

## 2023-05-22 DIAGNOSIS — I480 Paroxysmal atrial fibrillation: Secondary | ICD-10-CM | POA: Diagnosis not present

## 2023-05-22 DIAGNOSIS — Z510 Encounter for antineoplastic radiation therapy: Secondary | ICD-10-CM | POA: Diagnosis not present

## 2023-05-22 DIAGNOSIS — C023 Malignant neoplasm of anterior two-thirds of tongue, part unspecified: Secondary | ICD-10-CM | POA: Diagnosis not present

## 2023-05-22 DIAGNOSIS — E785 Hyperlipidemia, unspecified: Secondary | ICD-10-CM | POA: Diagnosis not present

## 2023-05-22 DIAGNOSIS — I420 Dilated cardiomyopathy: Secondary | ICD-10-CM | POA: Diagnosis not present

## 2023-05-22 DIAGNOSIS — I110 Hypertensive heart disease with heart failure: Secondary | ICD-10-CM | POA: Diagnosis not present

## 2023-05-22 DIAGNOSIS — I350 Nonrheumatic aortic (valve) stenosis: Secondary | ICD-10-CM | POA: Diagnosis not present

## 2023-05-22 DIAGNOSIS — I2510 Atherosclerotic heart disease of native coronary artery without angina pectoris: Secondary | ICD-10-CM | POA: Diagnosis not present

## 2023-05-22 LAB — RAD ONC ARIA SESSION SUMMARY
Course Elapsed Days: 31
Plan Fractions Treated to Date: 23
Plan Prescribed Dose Per Fraction: 2 Gy
Plan Total Fractions Prescribed: 30
Plan Total Prescribed Dose: 60 Gy
Reference Point Dosage Given to Date: 46 Gy
Reference Point Session Dosage Given: 2 Gy
Session Number: 23

## 2023-05-23 ENCOUNTER — Ambulatory Visit
Admission: RE | Admit: 2023-05-23 | Discharge: 2023-05-23 | Disposition: A | Discharge status: Home / Self Care | Payer: Medicare HMO | Source: Ambulatory Visit | Attending: Radiation Oncology | Admitting: Radiation Oncology

## 2023-05-23 ENCOUNTER — Other Ambulatory Visit: Payer: Self-pay

## 2023-05-23 DIAGNOSIS — E1140 Type 2 diabetes mellitus with diabetic neuropathy, unspecified: Secondary | ICD-10-CM | POA: Diagnosis not present

## 2023-05-23 DIAGNOSIS — Z510 Encounter for antineoplastic radiation therapy: Secondary | ICD-10-CM | POA: Diagnosis not present

## 2023-05-23 DIAGNOSIS — E785 Hyperlipidemia, unspecified: Secondary | ICD-10-CM | POA: Diagnosis not present

## 2023-05-23 DIAGNOSIS — I350 Nonrheumatic aortic (valve) stenosis: Secondary | ICD-10-CM | POA: Diagnosis not present

## 2023-05-23 DIAGNOSIS — I2510 Atherosclerotic heart disease of native coronary artery without angina pectoris: Secondary | ICD-10-CM | POA: Diagnosis not present

## 2023-05-23 DIAGNOSIS — I420 Dilated cardiomyopathy: Secondary | ICD-10-CM | POA: Diagnosis not present

## 2023-05-23 DIAGNOSIS — I110 Hypertensive heart disease with heart failure: Secondary | ICD-10-CM | POA: Diagnosis not present

## 2023-05-23 DIAGNOSIS — C023 Malignant neoplasm of anterior two-thirds of tongue, part unspecified: Secondary | ICD-10-CM | POA: Diagnosis not present

## 2023-05-23 DIAGNOSIS — I480 Paroxysmal atrial fibrillation: Secondary | ICD-10-CM | POA: Diagnosis not present

## 2023-05-23 DIAGNOSIS — J45909 Unspecified asthma, uncomplicated: Secondary | ICD-10-CM | POA: Diagnosis not present

## 2023-05-23 LAB — RAD ONC ARIA SESSION SUMMARY
Course Elapsed Days: 32
Plan Fractions Treated to Date: 24
Plan Prescribed Dose Per Fraction: 2 Gy
Plan Total Fractions Prescribed: 30
Plan Total Prescribed Dose: 60 Gy
Reference Point Dosage Given to Date: 48 Gy
Reference Point Session Dosage Given: 2 Gy
Session Number: 24

## 2023-05-26 ENCOUNTER — Other Ambulatory Visit: Payer: Self-pay

## 2023-05-26 ENCOUNTER — Ambulatory Visit
Admission: RE | Admit: 2023-05-26 | Discharge: 2023-05-26 | Disposition: A | Discharge status: Home / Self Care | Payer: Medicare HMO | Source: Ambulatory Visit | Attending: Radiation Oncology | Admitting: Radiation Oncology

## 2023-05-26 DIAGNOSIS — J45909 Unspecified asthma, uncomplicated: Secondary | ICD-10-CM | POA: Diagnosis not present

## 2023-05-26 DIAGNOSIS — E1140 Type 2 diabetes mellitus with diabetic neuropathy, unspecified: Secondary | ICD-10-CM | POA: Diagnosis not present

## 2023-05-26 DIAGNOSIS — C023 Malignant neoplasm of anterior two-thirds of tongue, part unspecified: Secondary | ICD-10-CM | POA: Diagnosis not present

## 2023-05-26 DIAGNOSIS — I480 Paroxysmal atrial fibrillation: Secondary | ICD-10-CM | POA: Diagnosis not present

## 2023-05-26 DIAGNOSIS — E785 Hyperlipidemia, unspecified: Secondary | ICD-10-CM | POA: Diagnosis not present

## 2023-05-26 DIAGNOSIS — I420 Dilated cardiomyopathy: Secondary | ICD-10-CM | POA: Diagnosis not present

## 2023-05-26 DIAGNOSIS — I110 Hypertensive heart disease with heart failure: Secondary | ICD-10-CM | POA: Diagnosis not present

## 2023-05-26 DIAGNOSIS — Z510 Encounter for antineoplastic radiation therapy: Secondary | ICD-10-CM | POA: Diagnosis not present

## 2023-05-26 DIAGNOSIS — I350 Nonrheumatic aortic (valve) stenosis: Secondary | ICD-10-CM | POA: Diagnosis not present

## 2023-05-26 DIAGNOSIS — I2510 Atherosclerotic heart disease of native coronary artery without angina pectoris: Secondary | ICD-10-CM | POA: Diagnosis not present

## 2023-05-26 LAB — RAD ONC ARIA SESSION SUMMARY
Course Elapsed Days: 35
Plan Fractions Treated to Date: 25
Plan Prescribed Dose Per Fraction: 2 Gy
Plan Total Fractions Prescribed: 30
Plan Total Prescribed Dose: 60 Gy
Reference Point Dosage Given to Date: 50 Gy
Reference Point Session Dosage Given: 2 Gy
Session Number: 25

## 2023-05-27 ENCOUNTER — Ambulatory Visit
Admission: RE | Admit: 2023-05-27 | Discharge: 2023-05-27 | Disposition: A | Discharge status: Home / Self Care | Payer: Medicare HMO | Source: Ambulatory Visit | Attending: Radiation Oncology | Admitting: Radiation Oncology

## 2023-05-27 ENCOUNTER — Other Ambulatory Visit: Payer: Self-pay

## 2023-05-27 DIAGNOSIS — I2510 Atherosclerotic heart disease of native coronary artery without angina pectoris: Secondary | ICD-10-CM | POA: Diagnosis not present

## 2023-05-27 DIAGNOSIS — E1140 Type 2 diabetes mellitus with diabetic neuropathy, unspecified: Secondary | ICD-10-CM | POA: Diagnosis not present

## 2023-05-27 DIAGNOSIS — I110 Hypertensive heart disease with heart failure: Secondary | ICD-10-CM | POA: Diagnosis not present

## 2023-05-27 DIAGNOSIS — I420 Dilated cardiomyopathy: Secondary | ICD-10-CM | POA: Diagnosis not present

## 2023-05-27 DIAGNOSIS — I480 Paroxysmal atrial fibrillation: Secondary | ICD-10-CM | POA: Diagnosis not present

## 2023-05-27 DIAGNOSIS — C023 Malignant neoplasm of anterior two-thirds of tongue, part unspecified: Secondary | ICD-10-CM | POA: Diagnosis not present

## 2023-05-27 DIAGNOSIS — Z510 Encounter for antineoplastic radiation therapy: Secondary | ICD-10-CM | POA: Diagnosis not present

## 2023-05-27 DIAGNOSIS — E785 Hyperlipidemia, unspecified: Secondary | ICD-10-CM | POA: Diagnosis not present

## 2023-05-27 DIAGNOSIS — J45909 Unspecified asthma, uncomplicated: Secondary | ICD-10-CM | POA: Diagnosis not present

## 2023-05-27 DIAGNOSIS — I350 Nonrheumatic aortic (valve) stenosis: Secondary | ICD-10-CM | POA: Diagnosis not present

## 2023-05-27 LAB — RAD ONC ARIA SESSION SUMMARY
Course Elapsed Days: 36
Plan Fractions Treated to Date: 26
Plan Prescribed Dose Per Fraction: 2 Gy
Plan Total Fractions Prescribed: 30
Plan Total Prescribed Dose: 60 Gy
Reference Point Dosage Given to Date: 52 Gy
Reference Point Session Dosage Given: 2 Gy
Session Number: 26

## 2023-05-28 ENCOUNTER — Inpatient Hospital Stay: Payer: Medicare HMO

## 2023-05-28 ENCOUNTER — Ambulatory Visit
Admission: RE | Admit: 2023-05-28 | Discharge: 2023-05-28 | Disposition: A | Discharge status: Home / Self Care | Payer: Medicare HMO | Source: Ambulatory Visit | Attending: Radiation Oncology | Admitting: Radiation Oncology

## 2023-05-28 ENCOUNTER — Other Ambulatory Visit: Payer: Self-pay

## 2023-05-28 DIAGNOSIS — J45909 Unspecified asthma, uncomplicated: Secondary | ICD-10-CM | POA: Diagnosis not present

## 2023-05-28 DIAGNOSIS — I2510 Atherosclerotic heart disease of native coronary artery without angina pectoris: Secondary | ICD-10-CM | POA: Diagnosis not present

## 2023-05-28 DIAGNOSIS — I420 Dilated cardiomyopathy: Secondary | ICD-10-CM | POA: Diagnosis not present

## 2023-05-28 DIAGNOSIS — C023 Malignant neoplasm of anterior two-thirds of tongue, part unspecified: Secondary | ICD-10-CM | POA: Diagnosis not present

## 2023-05-28 DIAGNOSIS — Z510 Encounter for antineoplastic radiation therapy: Secondary | ICD-10-CM | POA: Diagnosis not present

## 2023-05-28 DIAGNOSIS — I350 Nonrheumatic aortic (valve) stenosis: Secondary | ICD-10-CM | POA: Diagnosis not present

## 2023-05-28 DIAGNOSIS — E785 Hyperlipidemia, unspecified: Secondary | ICD-10-CM | POA: Diagnosis not present

## 2023-05-28 DIAGNOSIS — I110 Hypertensive heart disease with heart failure: Secondary | ICD-10-CM | POA: Diagnosis not present

## 2023-05-28 DIAGNOSIS — I480 Paroxysmal atrial fibrillation: Secondary | ICD-10-CM | POA: Diagnosis not present

## 2023-05-28 DIAGNOSIS — E1140 Type 2 diabetes mellitus with diabetic neuropathy, unspecified: Secondary | ICD-10-CM | POA: Diagnosis not present

## 2023-05-28 LAB — RAD ONC ARIA SESSION SUMMARY
Course Elapsed Days: 37
Plan Fractions Treated to Date: 27
Plan Prescribed Dose Per Fraction: 2 Gy
Plan Total Fractions Prescribed: 30
Plan Total Prescribed Dose: 60 Gy
Reference Point Dosage Given to Date: 54 Gy
Reference Point Session Dosage Given: 2 Gy
Session Number: 27

## 2023-05-29 ENCOUNTER — Telehealth: Payer: Self-pay | Admitting: Family Medicine

## 2023-05-29 ENCOUNTER — Other Ambulatory Visit: Payer: Self-pay | Admitting: *Deleted

## 2023-05-29 ENCOUNTER — Ambulatory Visit
Admission: RE | Admit: 2023-05-29 | Discharge: 2023-05-29 | Disposition: A | Discharge status: Home / Self Care | Payer: Medicare HMO | Source: Ambulatory Visit | Attending: Radiation Oncology | Admitting: Radiation Oncology

## 2023-05-29 ENCOUNTER — Other Ambulatory Visit: Payer: Self-pay

## 2023-05-29 DIAGNOSIS — I480 Paroxysmal atrial fibrillation: Secondary | ICD-10-CM | POA: Diagnosis not present

## 2023-05-29 DIAGNOSIS — E785 Hyperlipidemia, unspecified: Secondary | ICD-10-CM | POA: Diagnosis not present

## 2023-05-29 DIAGNOSIS — I2510 Atherosclerotic heart disease of native coronary artery without angina pectoris: Secondary | ICD-10-CM | POA: Diagnosis not present

## 2023-05-29 DIAGNOSIS — I420 Dilated cardiomyopathy: Secondary | ICD-10-CM | POA: Diagnosis not present

## 2023-05-29 DIAGNOSIS — Z510 Encounter for antineoplastic radiation therapy: Secondary | ICD-10-CM | POA: Diagnosis not present

## 2023-05-29 DIAGNOSIS — I350 Nonrheumatic aortic (valve) stenosis: Secondary | ICD-10-CM | POA: Diagnosis not present

## 2023-05-29 DIAGNOSIS — E1140 Type 2 diabetes mellitus with diabetic neuropathy, unspecified: Secondary | ICD-10-CM

## 2023-05-29 DIAGNOSIS — J45909 Unspecified asthma, uncomplicated: Secondary | ICD-10-CM | POA: Diagnosis not present

## 2023-05-29 DIAGNOSIS — I110 Hypertensive heart disease with heart failure: Secondary | ICD-10-CM | POA: Diagnosis not present

## 2023-05-29 DIAGNOSIS — C023 Malignant neoplasm of anterior two-thirds of tongue, part unspecified: Secondary | ICD-10-CM | POA: Diagnosis not present

## 2023-05-29 LAB — RAD ONC ARIA SESSION SUMMARY
Course Elapsed Days: 38
Plan Fractions Treated to Date: 28
Plan Prescribed Dose Per Fraction: 2 Gy
Plan Total Fractions Prescribed: 30
Plan Total Prescribed Dose: 60 Gy
Reference Point Dosage Given to Date: 56 Gy
Reference Point Session Dosage Given: 2 Gy
Session Number: 28

## 2023-05-29 MED ORDER — FLUCONAZOLE 100 MG PO TABS: 100.0000 mg | ORAL_TABLET | Freq: Every day | ORAL | 0 refills | Status: DC

## 2023-05-29 MED ORDER — INSULIN DETEMIR 100 UNIT/ML ~~LOC~~ SOLN
60.0000 [IU] | Freq: Two times a day (BID) | SUBCUTANEOUS | 1 refills | Status: DC
Start: 2023-05-29 — End: 2023-05-30

## 2023-05-29 NOTE — Telephone Encounter (Signed)
[facility] pharmacy requesting prescription refill insulin detemir (LEVEMIR) 100 UNIT/ML injection  Please advise

## 2023-05-30 ENCOUNTER — Ambulatory Visit: Payer: Medicare HMO

## 2023-05-30 ENCOUNTER — Other Ambulatory Visit: Payer: Self-pay | Admitting: Family Medicine

## 2023-05-30 DIAGNOSIS — Z794 Long term (current) use of insulin: Secondary | ICD-10-CM

## 2023-05-30 MED ORDER — INSULIN DETEMIR 100 UNIT/ML ~~LOC~~ SOLN
60.0000 [IU] | Freq: Two times a day (BID) | SUBCUTANEOUS | 1 refills | Status: DC
Start: 2023-05-30 — End: 2024-03-08

## 2023-06-02 ENCOUNTER — Ambulatory Visit: Payer: Medicare HMO

## 2023-06-03 ENCOUNTER — Ambulatory Visit: Admission: RE | Admit: 2023-06-03 | Payer: Medicare HMO | Source: Ambulatory Visit

## 2023-06-03 ENCOUNTER — Ambulatory Visit: Payer: Medicare HMO

## 2023-06-03 ENCOUNTER — Other Ambulatory Visit: Payer: Self-pay | Admitting: *Deleted

## 2023-06-03 MED ORDER — FLUCONAZOLE 100 MG PO TABS: 100.0000 mg | ORAL_TABLET | Freq: Every day | ORAL | 0 refills | Status: DC

## 2023-06-04 ENCOUNTER — Ambulatory Visit: Payer: Medicare HMO

## 2023-07-02 ENCOUNTER — Encounter: Payer: Self-pay | Admitting: Radiation Oncology

## 2023-07-02 ENCOUNTER — Ambulatory Visit
Admission: RE | Admit: 2023-07-02 | Discharge: 2023-07-02 | Disposition: A | Discharge status: Home / Self Care | Payer: Medicare HMO | Source: Ambulatory Visit | Attending: Radiation Oncology | Admitting: Radiation Oncology

## 2023-07-02 VITALS — BP 97/68 | HR 96 | Temp 97.6°F

## 2023-07-02 DIAGNOSIS — D0007 Carcinoma in situ of tongue: Secondary | ICD-10-CM | POA: Insufficient documentation

## 2023-07-02 NOTE — Progress Notes (Signed)
Radiation Oncology Follow up Note  Name: Sean Hayden   Date:   07/02/2023 MRN:  [id number] DOB: March 22, 1946    This 77 y.o. 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  REFERRING PROVIDER: Malva Limes, MD  HPI: Patient is a 77 year old male now out 1 month having completed external beam radiation therapy to his tongue for squamous cell carcinoma of the anterior left side.  Seen today in follow-up he is doing well he is swallowing well he is having no complaints at this time.  He had significant oral mucositis with completion of radiation that has resolved..  COMPLICATIONS OF TREATMENT: none  FOLLOW UP COMPLIANCE: keeps appointments   PHYSICAL EXAM:  BP 97/68   Pulse 96   Temp 97.6 F (36.4 C) (Tympanic)  Oral cavity shows minimal xerostomia.  No evidence of mass or nodularity region of the tongue is noted he has good tongue mobility.  No evidence of adenopathy in the head and neck region is noted.  Patient is wheelchair-bound.  Well-developed well-nourished patient in NAD. HEENT reveals PERLA, EOMI, discs not visualized.  Oral cavity is clear. No oral mucosal lesions are identified. Neck is clear without evidence of cervical or supraclavicular adenopathy. Lungs are clear to A&P. Cardiac examination is essentially unremarkable with regular rate and rhythm without murmur rub or thrill. Abdomen is benign with no organomegaly or masses noted. Motor sensory and DTR levels are equal and symmetric in the upper and lower extremities. Cranial nerves II through XII are grossly intact. Proprioception is intact. No peripheral adenopathy or edema is identified. No motor or sensory levels are noted. Crude visual fields are within normal range.  RADIOLOGY RESULTS: No current films for review  PLAN: The present time patient is doing  well 1 month out from external beam radiation of asked him to set up follow-up with ENT Dr. Rockne Coons.  I have also asked to see him back in 3 to 4 months for follow-up.  Patient knows to call with any concerns.  I would like to take this opportunity to thank you for allowing me to participate in the care of your patient.Carmina Miller, MD

## 2023-07-14 ENCOUNTER — Ambulatory Visit: Payer: Medicare HMO | Admitting: Family Medicine

## 2023-07-30 DIAGNOSIS — Z08 Encounter for follow-up examination after completed treatment for malignant neoplasm: Secondary | ICD-10-CM | POA: Diagnosis not present

## 2023-07-30 DIAGNOSIS — Z85810 Personal history of malignant neoplasm of tongue: Secondary | ICD-10-CM | POA: Diagnosis not present

## 2023-08-21 DIAGNOSIS — Z952 Presence of prosthetic heart valve: Secondary | ICD-10-CM | POA: Diagnosis not present

## 2023-10-02 ENCOUNTER — Encounter: Payer: Self-pay | Admitting: Radiation Oncology

## 2023-10-02 ENCOUNTER — Ambulatory Visit
Admission: RE | Admit: 2023-10-02 | Discharge: 2023-10-02 | Disposition: A | Discharge status: Home / Self Care | Payer: Medicare HMO | Source: Ambulatory Visit | Attending: Radiation Oncology | Admitting: Radiation Oncology

## 2023-10-02 ENCOUNTER — Other Ambulatory Visit: Payer: Self-pay | Admitting: *Deleted

## 2023-10-02 VITALS — BP 113/78 | HR 105 | Temp 98.0°F | Resp 20 | Ht 67.0 in | Wt 227.0 lb

## 2023-10-02 DIAGNOSIS — D0007 Carcinoma in situ of tongue: Secondary | ICD-10-CM

## 2023-10-02 DIAGNOSIS — Z923 Personal history of irradiation: Secondary | ICD-10-CM | POA: Diagnosis not present

## 2023-10-02 DIAGNOSIS — C023 Malignant neoplasm of anterior two-thirds of tongue, part unspecified: Secondary | ICD-10-CM | POA: Diagnosis not present

## 2023-10-02 MED ORDER — SUCRALFATE 1 G PO TABS: 1.0000 g | ORAL_TABLET | Freq: Three times a day (TID) | ORAL | 1 refills | Status: DC

## 2023-10-02 NOTE — Progress Notes (Signed)
Radiation Oncology Follow up Note  Name: Sean Hayden   Date:   10/02/2023 MRN:  [id number] DOB: 05-Jul-1946    This 77 y.o. male presents to the clinic today for 17-month follow-up status post external beam radiation therapy for squamous cell Colon Branch of the anterior left side of his tongue status post resection with positive margins.  REFERRING PROVIDER: Malva Limes, MD  HPI: Patient is a 77 year old male now out for months having completed external beam radiation therapy to his left sided anterior tongue status post resection with positive margins for squamous cell carcinoma seen today in follow-up his only complaint is some burning when he eats food.  He specifically [place] dysphagia or any other head and neck pain.  He is under ENTs follow-up schedule and he states they see no evidence of disease..  COMPLICATIONS OF TREATMENT: none  FOLLOW UP COMPLIANCE: keeps appointments   PHYSICAL EXAM:  BP 113/78   Pulse (!) 105   Temp 98 F (36.7 C) (Tympanic)   Resp 20   Ht 5\' 7"  (1.702 m) Comment: stated Ht  Wt 227 lb (103 kg)   BMI 35.55 kg/m  Oral cavity is clear status post partial resection of his oral tongue.  No evidence of mass or nodularity is noted.  Neck is clear without evidence of cervical or supraclavicular adenopathy.  Well-developed well-nourished patient in NAD. HEENT reveals PERLA, EOMI, discs not visualized.  Oral cavity is clear. No oral mucosal lesions are identified. Neck is clear without evidence of cervical or supraclavicular adenopathy. Lungs are clear to A&P. Cardiac examination is essentially unremarkable with regular rate and rhythm without murmur rub or thrill. Abdomen is benign with no organomegaly or masses noted. Motor sensory and DTR levels are equal and symmetric in the upper and lower extremities. Cranial nerves II through XII are grossly intact. Proprioception is intact. No peripheral adenopathy or edema is identified. No motor or sensory levels are noted.  Crude visual fields are within normal range.  RADIOLOGY RESULTS: No current films for review  PLAN: I have asked to see him back in 6 months for follow-up.  He continues close follow-up care with ENT.  I have started him on Carafate rinses 2-3 times a day to help with some of the burning.  Patient knows to call with any concerns at any time.  I would like to take this opportunity to thank you for allowing me to participate in the care of your patient.Carmina Miller, MD

## 2023-11-04 DIAGNOSIS — Z08 Encounter for follow-up examination after completed treatment for malignant neoplasm: Secondary | ICD-10-CM | POA: Diagnosis not present

## 2023-11-04 DIAGNOSIS — Z85810 Personal history of malignant neoplasm of tongue: Secondary | ICD-10-CM | POA: Diagnosis not present

## 2023-12-01 DIAGNOSIS — L089 Local infection of the skin and subcutaneous tissue, unspecified: Secondary | ICD-10-CM | POA: Diagnosis not present

## 2023-12-01 DIAGNOSIS — S81812A Laceration without foreign body, left lower leg, initial encounter: Secondary | ICD-10-CM | POA: Diagnosis not present

## 2024-01-13 ENCOUNTER — Ambulatory Visit: Payer: Medicare HMO

## 2024-01-13 DIAGNOSIS — Z0000 Encounter for general adult medical examination without abnormal findings: Secondary | ICD-10-CM | POA: Diagnosis not present

## 2024-01-13 NOTE — Progress Notes (Signed)
 Subjective:   Sean Hayden is a 78 y.o. male who presents for Medicare Annual/Subsequent preventive examination.  Visit Complete: Virtual I connected with  Sean Hayden on 01/13/24 by a audio enabled telemedicine application and verified that I am speaking with the correct person using two identifiers.  This patient declined Interactive audio and acupuncturist. Therefore the visit was completed with audio only.   Patient Location: Home  Provider Location: Office/Clinic  I discussed the limitations of evaluation and management by telemedicine. The patient expressed understanding and agreed to proceed.  Vital Signs: Because this visit was a virtual/telehealth visit, some criteria may be missing or patient reported. Any vitals not documented were not able to be obtained and vitals that have been documented are patient reported.  Cardiac Risk Factors include: advanced age (>79men, >22 women);diabetes mellitus;male gender;sedentary lifestyle;hypertension;obesity (BMI >30kg/m2)     Objective:    There were no vitals filed for this visit. There is no height or weight on file to calculate BMI.     01/13/2024    8:13 AM 10/02/2023    1:53 PM 07/02/2023    1:28 PM 04/02/2023    2:07 PM 03/21/2023    6:34 AM 03/14/2023    2:50 PM 01/06/2023   10:33 AM  Advanced Directives  Does Patient Have a Medical Advance Directive? No Yes No No No Yes No  Type of Special Educational Needs Teacher of [place];Living will       Does patient want to make changes to medical advance directive?  No - Patient declined       Copy of Healthcare Power of Attorney in Chart?  No - copy requested       Would patient like information on creating a medical advance directive? No - Patient declined  No - Patient declined No - Patient declined No - Patient declined  No - Patient declined    Current Medications (verified) Outpatient Encounter Medications as of 01/13/2024  Medication Sig   acyclovir   (ZOVIRAX ) 400 MG tablet 1/2 tablet up to 5 times a day for cold sores (Patient taking differently: as needed. 1/2 tablet up to 5 times a day for cold sores)   albuterol  (VENTOLIN  HFA) 108 (90 Base) MCG/ACT inhaler Inhale 2 puffs into the lungs every 6 (six) hours as needed for wheezing or shortness of breath.   atorvastatin  (LIPITOR) 20 MG tablet Take 1 tablet (20 mg total) by mouth every evening. (Patient taking differently: Take 40 mg by mouth every evening.)   benzocaine  (ORAJEL) 10 % mucosal gel Use as directed 1 application in the mouth or throat as needed for mouth pain.   Cholecalciferol  (VITAMIN D  PO) Take 2,000 Units by mouth daily.    empagliflozin  (JARDIANCE ) 25 MG TABS tablet Take 1 tablet (25 mg total) by mouth daily.   Flaxseed, Linseed, (FLAXSEED OIL) 1000 MG CAPS Take 1,000 mg by mouth 2 (two) times daily.   fluticasone  (FLONASE ) 50 MCG/ACT nasal spray Place 2 sprays into both nostrils daily. (Patient taking differently: Place 2 sprays into both nostrils as needed.)   furosemide  (LASIX ) 20 MG tablet TAKE ONE (1) TABLET BY MOUTH ONCE DAILY AS NEEDED FOR EDEMA OR WEIGHT GAIN > 2 POUNDS IN 24 HOURS   gabapentin  (NEURONTIN ) 100 MG capsule Take 1 capsule (100 mg total) by mouth 2 (two) times daily.   HYDROcodone -acetaminophen  (NORCO/VICODIN) 5-325 MG tablet Take 1 tablet by mouth every 4 (four) hours as needed for moderate pain.   insulin   detemir (LEVEMIR ) 100 UNIT/ML injection Inject 0.6 mLs (60 Units total) into the skin 2 (two) times daily.   levETIRAcetam  (KEPPRA ) 750 MG tablet Take 1 tablet (750 mg total) by mouth 2 (two) times daily.   lisinopril  (ZESTRIL ) 2.5 MG tablet Take 5 mg by mouth daily.   loratadine  (CLARITIN ) 10 MG tablet Take 10 mg by mouth daily.   metFORMIN  (GLUCOPHAGE ) 850 MG tablet Take 1 tablet (850 mg total) by mouth 2 (two) times daily with a meal.   metoprolol  tartrate (LOPRESSOR ) 25 MG tablet Take 0.5 tablets (12.5 mg total) by mouth every 12 (twelve) hours.    Misc Natural Products (OSTEO BI-FLEX TRIPLE STRENGTH PO) Take 1 tablet by mouth 2 (two) times daily.    MISC NATURAL PRODUCTS PO Take 1 capsule by mouth in the morning and at bedtime. Super Beta Prostate   Omega-3 Fatty Acids (FISH OIL) 1000 MG CPDR Take 1,200 mg by mouth 2 (two) times daily.    PARoxetine  (PAXIL ) 40 MG tablet Take 1 tablet (40 mg total) by mouth daily.   rivaroxaban  (XARELTO ) 20 MG TABS tablet TAKE ONE TABLET BY MOUTH DAILY WITH SUPPER   sucralfate  (CARAFATE ) 1 g tablet Take 0.5 tablets (0.5 g total) by mouth 4 (four) times daily -  with meals and at bedtime. Dissolve half a tablet in 4 tablespoons warm water swish and swallow   sucralfate  (CARAFATE ) 1 g tablet Take 1 tablet (1 g total) by mouth 4 (four) times daily -  with meals and at bedtime.   Docosanol  10 % CREA Apply topically up to 5 times per day as needed until heals (Patient not taking: Reported on 01/13/2024)   fluconazole  (DIFLUCAN ) 100 MG tablet Take 1 tablet (100 mg total) by mouth daily. (Patient not taking: Reported on 01/13/2024)   No facility-administered encounter medications on file as of 01/13/2024.    Allergies (verified) Pioglitazone   History: Past Medical History:  Diagnosis Date   Aortic stenosis    a.) TTE 02/22/2016: moderate - severe AS (MPG 17); b.) s/p TAVR 09/09/2022: 26 mm Edwards Sapien 3 Ultra Resilia   Asthma    B12 deficiency    Carotid stenosis, bilateral    a.) carotid doppler 03/10/2012: 75-95% RICA, < 50 LICA; b.) s/p RIGHT CEA 04/17/2012 with CorMatrix patch angioplasty   Cerebral infarction [facility]) 01/03/2023   Mar 28, 2016 Entered By: BRYNN MCLEAN GRACE Comment: March 2017. treated at [place].Mar 28, 2016 Entered By: BRYNN MCLEAN GRACE Comment: atrial fibrillation on holter   Coronary artery disease    Gout    Heart murmur    Hemorrhoids    HFrEF (heart failure with reduced ejection fraction) (HCC)    a.) TTE 02/22/2016: EF 60-65%, mild LV dil, mod RA dil, mod-sev AS,  G1DD; b.) TTE 09/10/2022: EF 40%, glob HK, sep AK, mild BAE, mild TR, mod MR, pros AoV (MPG 3)   History of kidney stones    Hyperlipidemia    Hyperplastic colon polyp    Hypertension    Insomnia    Ischemic cerebrovascular accident (CVA) (HCC) 02/21/2016   a.) presented with LEFT sided weakness; b.) MRI brain 02/21/2016: acute infarct in the right medial parietal lobe measuring 5 mm   Long term current use of anticoagulant    a.) rivaroxaban    Neuropathy    Nonischemic congestive cardiomyopathy (HCC)    a.) TTE 02/22/2016: EF 60-65%; b.) TTE 09/10/2022: EF 40%   Nonrheumatic aortic (valve) stenosis 08/23/2022   S/p TAVR Edwards biosprosthetic  valve   Nose colonized with MRSA 02/21/2016   a.) surgical PCR (+) 02/21/2016 prior to ILR implant   NSVT (nonsustained ventricular tachycardia) (HCC)    a) holter 09/10/2022: longest episode 23 beats, and fastest episode 164 bpm   PAF (paroxysmal atrial fibrillation) (HCC)    a.) CHA2DS2VASc = 8 (age x2, HFrEF, HTN, CVAx2, vascular disease history, T2DM);  b.) s/p ILR implant 02/23/2016; c.) rate/rhythm maintained on oral metoprolol  tartrate; chronically anticoagulated with rivaroxaban    Pneumonia    S/P TAVR (transcatheter aortic valve replacement) 09/09/2022   a.) 26 mm Edwards Sapien 3 Ultra Resilia   Seizures (HCC)    Skin cancer of face    T2DM (type 2 diabetes mellitus) (HCC)    Tongue neoplasm    Past Surgical History:  Procedure Laterality Date   Arm fracture Left    CAROTID ARTERY ANGIOPLASTY Right 04/17/2012   Procedure: CAROTID ARTERY ANGIOPLASTY; Location: [facility]; Surgeon: Cordella Shawl, MD   CAROTID ENDARTERECTOMY Right 04/17/2012   Procedure: CAROTID ENDARTARECTOMY; Location: [facility]; Surgeon: Cordella Shawl, MD   CHOLECYSTECTOMY, LAPAROSCOPIC  2005   COLONOSCOPY     CYST EXCISION     between legs   EP IMPLANTABLE DEVICE N/A 02/23/2016   Procedure: Loop Recorder Insertion;  Surgeon: Lynwood Rakers, MD;  Location: [facility]  INVASIVE CV LAB;  Service: Cardiovascular;  Laterality: N/A;   HEMORRHOID SURGERY  2009   INCISION AND DRAINAGE WOUND WITH NERVE AND ARTERY REPAIR Left 10/04/2017   Procedure: LEFT HAND WOUND EXPLORATION repair as necessary;  Surgeon: Shari Easter, MD;  Location: [facility] OR;  Service: Orthopedics;  Laterality: Left;   MOHS SURGERY Right 12/24/2022   - Adine Gold, MD   TEE WITHOUT CARDIOVERSION N/A 02/23/2016   Procedure: TRANSESOPHAGEAL ECHOCARDIOGRAM (TEE);  Surgeon: Vinie JAYSON Maxcy, MD;  Location: [facility] ENDOSCOPY;  Service: Cardiovascular;  Laterality: N/A;  ALSO GETTING A LOOP   TRANSCATHETER AORTIC VALVE REPLACEMENT, TRANSFEMORAL  09/09/2022   Procedure: TRANSCATHETER AORTIC VALVE REPLACEMENT, TRANSFEMORAL; Location: [facility]; Surgeon: Juliene Pouch, MD   Family History  Problem Relation Age of Onset   Hodgkin's lymphoma Father    Diabetes Mother    Social History   Socioeconomic History   Marital status: Married    Spouse name: Hargis   Number of children: 2   Years of education: HS Grad   Highest education level: 12th grade  Occupational History   Occupation: Retired  Tobacco Use   Smoking status: Former    Types: Cigars    Quit date: 04/08/2012    Years since quitting: 11.7   Smokeless tobacco: Never   Tobacco comments:    cigarettes and cigars  all quit 12 years ago  Vaping Use   Vaping status: Never Used  Substance and Sexual Activity   Alcohol use: No    Alcohol/week: 0.0 standard drinks of alcohol    Comment: quit 2013   Drug use: No   Sexual activity: Not on file  Other Topics Concern   Not on file  Social History Narrative   Lives at home with wife   Right-handed.   No caffeine use.       Social Drivers of Corporate Investment Banker Strain: Low Risk  (01/13/2024)   Overall Financial Resource Strain (CARDIA)    Difficulty of Paying Living Expenses: Not hard at all  Food Insecurity: No Food Insecurity (01/13/2024)   Hunger Vital Sign    Worried About Running  Out of Food in the Last Year: Never true  Ran Out of Food in the Last Year: Never true  Transportation Needs: No Transportation Needs (01/13/2024)   PRAPARE - Administrator, Civil Service (Medical): No    Lack of Transportation (Non-Medical): No  Physical Activity: Inactive (01/13/2024)   Exercise Vital Sign    Days of Exercise per Week: 0 days    Minutes of Exercise per Session: 0 min  Stress: No Stress Concern Present (01/13/2024)   [employer] of Occupational Health - Occupational Stress Questionnaire    Feeling of Stress : Only a little  Social Connections: Moderately Isolated (01/13/2024)   Social Connection and Isolation Panel [NHANES]    Frequency of Communication with Friends and Family: More than three times a week    Frequency of Social Gatherings with Friends and Family: Once a week    Attends Religious Services: Never    Database Administrator or Organizations: No    Attends Engineer, Structural: Never    Marital Status: Married    Tobacco Counseling Counseling given: Not Answered Tobacco comments: cigarettes and cigars  all quit 12 years ago   Clinical Intake:  Pre-visit preparation completed: Yes  Pain : No/denies pain     Nutritional Status: BMI > 30  Obese Nutritional Risks: None Diabetes: Yes CBG done?: No Did pt. bring in CBG monitor from home?: No  How often do you need to have someone help you when you read instructions, pamphlets, or other written materials from your doctor or pharmacy?: 1 - Never  Interpreter Needed?: No  Information entered by :: JHONNIE DAS, LPN   Activities of Daily Living    01/13/2024    8:14 AM 04/07/2023    9:38 AM  In your present state of health, do you have any difficulty performing the following activities:  Hearing? 0 1  Vision? 0 0  Difficulty concentrating or making decisions? 0 0  Walking or climbing stairs? 0 1  Dressing or bathing? 0 0  Doing errands, shopping? 0 1  Preparing Food  and eating ? N   Using the Toilet? N   In the past six months, have you accidently leaked urine? N   Do you have problems with loss of bowel control? N   Managing your Medications? N   Managing your Finances? N   Housekeeping or managing your Housekeeping? N     Patient Care Team: Gasper Nancyann BRAVO, MD as PCP - General (Family Medicine) Onita Duos, MD as Consulting Physician (Neurology) Pa, [facility]) Center, [facility] as Referring Physician (General Practice) Sandria Selma LABOR, [facility] (Inactive) as Pharmacist (Pharmacist) Myrna Adine Anes, MD as Consulting Physician (Ophthalmology) Kelsie Agent, MD (Inactive) as Consulting Physician (Cardiology) Edda Mt, MD (Otolaryngology)  Indicate any recent Medical Services you may have received from other than [facility] providers in the past year (date may be approximate).     Assessment:   This is a routine wellness examination for [place]ine.  Hearing/Vision screen Hearing Screening - Comments:: NO AIDS Vision Screening - Comments:: WEARS GLASSES- DR.KING   Goals Addressed             This Visit's Progress    Cut out extra servings         Depression Screen    01/13/2024    8:12 AM 04/07/2023    9:34 AM 01/06/2023   10:32 AM 10/24/2022   11:15 AM 01/03/2022   10:42 AM 07/18/2021    9:08 AM 12/28/2020  10:36 AM  PHQ 2/9 Scores  PHQ - 2 Score 0 3 0 1 0 3 0  PHQ- 9 Score 0 5 0 4  12     Fall Risk    01/13/2024    8:14 AM 04/07/2023    9:34 AM 01/06/2023   10:34 AM 10/16/2022   10:16 AM 01/03/2022   10:45 AM  Fall Risk   Falls in the past year? 1 1 0 1 1  Number falls in past yr: 1 1 0 0 1  Injury with Fall? 0 1 0 0 0  Risk for fall due to : History of fall(s)  No Fall Risks Medication side effect History of fall(s)  Follow up Falls evaluation completed;Falls prevention discussed  Falls prevention discussed;Falls evaluation completed Education provided;Falls prevention discussed Falls prevention  discussed    MEDICARE RISK AT HOME: Medicare Risk at Home Any stairs in or around the home?: Yes If so, are there any without handrails?: No Home free of loose throw rugs in walkways, pet beds, electrical cords, etc?: Yes Adequate lighting in your home to reduce risk of falls?: Yes Life alert?: No Use of a cane, walker or w/c?: No Grab bars in the bathroom?: Yes Shower chair or bench in shower?: Yes Elevated toilet seat or a handicapped toilet?: No  TIMED UP AND GO:  Was the test performed?  No    Cognitive Function:        01/13/2024    8:15 AM 01/06/2023   10:40 AM 12/06/2016   10:09 AM  6CIT Screen  What Year? 0 points 0 points 4 points  What month? 0 points 0 points 0 points  What time? 0 points 3 points 0 points  Count back from 20 0 points 0 points 0 points  Months in reverse 4 points 2 points 0 points  Repeat phrase 4 points 2 points 2 points  Total Score 8 points 7 points 6 points    Immunizations Immunization History  Administered Date(s) Administered   Fluad Quad(high Dose 65+) 09/28/2019, 10/11/2020, 10/17/2021, 09/11/2022   Influenza, High Dose Seasonal PF 08/25/2015, 09/13/2016, 08/01/2017, 08/19/2018   Influenza, Seasonal, Injecte, Preservative Fre 09/23/2014   Influenza-Unspecified 08/09/2013, 09/23/2014, 09/20/2015, 08/08/2017, 08/10/2019, 10/17/2021   PFIZER(Purple Top)SARS-COV-2 Vaccination 02/08/2020, 02/29/2020, 10/03/2020   Pfizer(Comirnaty)Fall Seasonal Vaccine 12 years and older 12/26/2022   Pneumococcal Conjugate-13 10/17/2014, 11/01/2014   Pneumococcal Polysaccharide-23 11/15/2008, 04/27/2012, 10/08/2013   Pneumococcal-Unspecified 07/09/2017   Tdap 04/17/2010, 02/14/2011, 05/09/2013, 07/23/2013, 09/15/2017, 01/29/2023   Zoster Recombinant(Shingrix ) 08/20/2021, 02/14/2022   Zoster, Live 09/23/2014    TDAP status: Up to date  Flu Vaccine status: Up to date  Pneumococcal vaccine status: Up to date  Covid-19 vaccine status: Completed  vaccines  Qualifies for Shingles Vaccine? Yes   Zostavax completed Yes   Shingrix  Completed?: Yes  Screening Tests Health Maintenance  Topic Date Due   Lung Cancer Screening  Never done   FOOT EXAM  09/08/2017   OPHTHALMOLOGY EXAM  02/14/2022   Diabetic kidney evaluation - Urine ACR  01/25/2023   INFLUENZA VACCINE  07/10/2023   COVID-19 Vaccine (5 - 2024-25 season) 08/10/2023   HEMOGLOBIN A1C  10/07/2023   Diabetic kidney evaluation - eGFR measurement  03/16/2024   Medicare Annual Wellness (AWV)  01/12/2025   DTaP/Tdap/Td (7 - Td or Tdap) 01/29/2033   Pneumonia Vaccine 72+ Years old  Completed   Hepatitis C Screening  Completed   Zoster Vaccines- Shingrix   Completed   HPV VACCINES  Aged Out  Fecal DNA (Cologuard)  Discontinued    Health Maintenance  Health Maintenance Due  Topic Date Due   Lung Cancer Screening  Never done   FOOT EXAM  09/08/2017   OPHTHALMOLOGY EXAM  02/14/2022   Diabetic kidney evaluation - Urine ACR  01/25/2023   INFLUENZA VACCINE  07/10/2023   COVID-19 Vaccine (5 - 2024-25 season) 08/10/2023   HEMOGLOBIN A1C  10/07/2023    Colorectal cancer screening: No longer required.   Lung Cancer Screening: (Low Dose CT Chest recommended if Age 25-80 years, 20 pack-year currently smoking OR have quit w/in 15years.) does not qualify.    Additional Screening:  Hepatitis C Screening: does qualify; Completed 02/21/16  Vision Screening: Recommended annual ophthalmology exams for early detection of glaucoma and other disorders of the eye. Is the patient up to date with their annual eye exam?  Yes  Who is the provider or what is the name of the office in which the patient attends annual eye exams? DR.MYRNA If pt is not established with a provider, would they like to be referred to a provider to establish care? No .   Dental Screening: Recommended annual dental exams for proper oral hygiene  Diabetic Foot Exam: Diabetic Foot Exam: Overdue, Pt has been advised  about the importance in completing this exam. Pt is scheduled for diabetic foot exam on 00.  Community Resource Referral / Chronic Care Management: CRR required this visit?  No   CCM required this visit?  No     Plan:     I have personally reviewed and noted the following in the patient's chart:   Medical and social history Use of alcohol, tobacco or illicit drugs  Current medications and supplements including opioid prescriptions. Patient is currently taking opioid prescriptions. Information provided to patient regarding non-opioid alternatives. Patient advised to discuss non-opioid treatment plan with their provider. Functional ability and status Nutritional status Physical activity Advanced directives List of other physicians Hospitalizations, surgeries, and ER visits in previous 12 months Vitals Screenings to include cognitive, depression, and falls Referrals and appointments  In addition, I have reviewed and discussed with patient certain preventive protocols, quality metrics, and best practice recommendations. A written personalized care plan for preventive services as well as general preventive health recommendations were provided to patient.     Jhonnie GORMAN Das, LPN   06/12/7973   After Visit Summary: (MyChart) Due to this being a telephonic visit, the after visit summary with patients personalized plan was offered to patient via MyChart   Nurse Notes: NONE

## 2024-01-13 NOTE — Patient Instructions (Addendum)
 Mr. Sean Hayden , Thank you for taking time to come for your Medicare Wellness Visit. I appreciate your ongoing commitment to your health goals. Please review the following plan we discussed and let me know if I can assist you in the future.   Referrals/Orders/Follow-Ups/Clinician Recommendations: NONE  This is a list of the screening recommended for you and due dates:  Health Maintenance  Topic Date Due   Screening for Lung Cancer  Never done   Complete foot exam   09/08/2017   Eye exam for diabetics  02/14/2022   Yearly kidney health urinalysis for diabetes  01/25/2023   Flu Shot  07/10/2023   COVID-19 Vaccine (5 - 2024-25 season) 08/10/2023   Hemoglobin A1C  10/07/2023   Yearly kidney function blood test for diabetes  03/16/2024   Medicare Annual Wellness Visit  01/12/2025   DTaP/Tdap/Td vaccine (7 - Td or Tdap) 01/29/2033   Pneumonia Vaccine  Completed   Hepatitis C Screening  Completed   Zoster (Shingles) Vaccine  Completed   HPV Vaccine  Aged Out   Cologuard (Stool DNA test)  Discontinued    Advanced directives: (ACP Link)Information on Advanced Care Planning can be found at [place]  Secretary of State Advance Health Care Directives Advance Health Care Directives ([email or web address]   Next Medicare Annual Wellness Visit scheduled for next year: Yes   01/18/25 @ 8:10 AM BY PHONE

## 2024-01-23 ENCOUNTER — Encounter: Payer: Self-pay | Admitting: Family Medicine

## 2024-01-23 ENCOUNTER — Ambulatory Visit: Payer: Medicare HMO | Admitting: Family Medicine

## 2024-01-23 VITALS — BP 116/63 | HR 87 | Resp 16 | Ht 67.0 in | Wt 214.0 lb

## 2024-01-23 DIAGNOSIS — E1165 Type 2 diabetes mellitus with hyperglycemia: Secondary | ICD-10-CM

## 2024-01-23 DIAGNOSIS — J040 Acute laryngitis: Secondary | ICD-10-CM

## 2024-01-23 DIAGNOSIS — E538 Deficiency of other specified B group vitamins: Secondary | ICD-10-CM

## 2024-01-23 DIAGNOSIS — R7989 Other specified abnormal findings of blood chemistry: Secondary | ICD-10-CM | POA: Diagnosis not present

## 2024-01-23 DIAGNOSIS — E559 Vitamin D deficiency, unspecified: Secondary | ICD-10-CM | POA: Diagnosis not present

## 2024-01-23 DIAGNOSIS — I10 Essential (primary) hypertension: Secondary | ICD-10-CM | POA: Diagnosis not present

## 2024-01-23 DIAGNOSIS — I5020 Unspecified systolic (congestive) heart failure: Secondary | ICD-10-CM | POA: Diagnosis not present

## 2024-01-23 DIAGNOSIS — Z0000 Encounter for general adult medical examination without abnormal findings: Secondary | ICD-10-CM | POA: Diagnosis not present

## 2024-01-23 MED ORDER — AZITHROMYCIN 250 MG PO TABS: ORAL_TABLET | ORAL | 0 refills | Status: AC

## 2024-01-23 NOTE — Progress Notes (Signed)
Complete physical exam   Patient: Sean Hayden   DOB: 1946/01/13   78 y.o. Male  MRN: [id number] Visit Date: 01/23/2024  Today's healthcare provider: Mila Merry, MD   Chief Complaint  Patient presents with   Annual Exam    AWV was 01/13/2024   Diabetes   Hyperlipidemia   Hypothyroidism   Hypertension   Subjective    Discussed the use of AI scribe software for clinical note transcription with the patient, who gave verbal consent to proceed.  History of Present Illness   Sean Hayden is a 78 year old male who presents for an annual physical exam.  He reports having laryngitis, which he attributes to previous radiation and surgery for SCC of tongue, although has been much more pronounced the last week. He experiences a very dry mouth, which he believes is related to the radiation treatment. He has no cold, runny nose, or stuffy nose, but does have a dry mouth and some coughing.  He has a history of diabetes and is currently taking insulin, Jardiance, and metformin. His blood sugar levels have been stable.  He mentions having a heart valve replacement in the past. He experiences swelling around his ankles and has chronic dyspnea, which has been a long-standing issue without recent changes.     He is also due for follow up hypothyroid, hyperlipidemia, b12 and d deficiency.   Lab Results  Component Value Date   HGBA1C 8.0 (A) 04/07/2023   HGBA1C 7.6 (A) 05/20/2022   HGBA1C 8.5 (H) 01/25/2022   Lab Results  Component Value Date   VITAMINB12 211 (L) 01/25/2022   Lab Results  Component Value Date   VD25OH 40.7 01/25/2022   Lab Results  Component Value Date   NA 138 03/17/2023   K 3.9 03/17/2023   CREATININE 0.81 03/17/2023   GFRNONAA >60 03/17/2023   GLUCOSE 181 (H) 03/17/2023     HPI     Annual Exam    Additional comments: AWV was 01/13/2024      Last edited by Malva Limes, MD on 01/23/2024  3:10 PM.       Past Medical History:   Diagnosis Date   Aortic stenosis    a.) TTE 02/22/2016: moderate - severe AS (MPG 17); b.) s/p TAVR 09/09/2022: 26 mm Edwards Sapien 3 Ultra Resilia   Asthma    B12 deficiency    Carotid stenosis, bilateral    a.) carotid doppler 03/10/2012: 75-95% RICA, < 50 LICA; b.) s/p RIGHT CEA 04/17/2012 with CorMatrix patch angioplasty   Cerebral infarction [facility]) 01/03/2023   Mar 28, 2016 Entered By: Layla Barter GRACE Comment: March 2017. treated at [facility].Mar 28, 2016 Entered By: Layla Barter GRACE Comment: atrial fibrillation on holter   Coronary artery disease    Gout    Heart murmur    Hemorrhoids    HFrEF (heart failure with reduced ejection fraction) (HCC)    a.) TTE 02/22/2016: EF 60-65%, mild LV dil, mod RA dil, mod-sev AS, G1DD; b.) TTE 09/10/2022: EF 40%, glob HK, sep AK, mild BAE, mild TR, mod MR, pros AoV (MPG 3)   History of kidney stones    Hyperlipidemia    Hyperplastic colon polyp    Hypertension    Insomnia    Ischemic cerebrovascular accident (CVA) (HCC) 02/21/2016   a.) presented with LEFT sided weakness; b.) MRI brain 02/21/2016: acute infarct in the right medial parietal lobe measuring 5 mm   Long term current use of  anticoagulant    a.) rivaroxaban   Neuropathy    Nonischemic congestive cardiomyopathy (HCC)    a.) TTE 02/22/2016: EF 60-65%; b.) TTE 09/10/2022: EF 40%   Nonrheumatic aortic (valve) stenosis 08/23/2022   S/p TAVR Edwards biosprosthetic valve   Nose colonized with MRSA 02/21/2016   a.) surgical PCR (+) 02/21/2016 prior to ILR implant   NSVT (nonsustained ventricular tachycardia) (HCC)    a) holter 09/10/2022: longest episode 23 beats, and fastest episode 164 bpm   PAF (paroxysmal atrial fibrillation) (HCC)    a.) CHA2DS2VASc = 8 (age x2, HFrEF, HTN, CVAx2, vascular disease history, T2DM);  b.) s/p ILR implant 02/23/2016; c.) rate/rhythm maintained on oral metoprolol tartrate; chronically anticoagulated with rivaroxaban   Pneumonia    S/P  TAVR (transcatheter aortic valve replacement) 09/09/2022   a.) 26 mm Edwards Sapien 3 Ultra Resilia   Seizures (HCC)    Skin cancer of face    T2DM (type 2 diabetes mellitus) (HCC)    Tongue neoplasm    Past Surgical History:  Procedure Laterality Date   Arm fracture Left    CAROTID ARTERY ANGIOPLASTY Right 04/17/2012   Procedure: CAROTID ARTERY ANGIOPLASTY; Location: [facility]; Surgeon: Levora Dredge, MD   CAROTID ENDARTERECTOMY Right 04/17/2012   Procedure: CAROTID ENDARTARECTOMY; Location: [facility]; Surgeon: Levora Dredge, MD   CHOLECYSTECTOMY, LAPAROSCOPIC  2005   COLONOSCOPY     CYST EXCISION     "between legs"   EP IMPLANTABLE DEVICE N/A 02/23/2016   Procedure: Loop Recorder Insertion;  Surgeon: Hillis Range, MD;  Location: [facility] INVASIVE CV LAB;  Service: Cardiovascular;  Laterality: N/A;   HEMORRHOID SURGERY  2009   INCISION AND DRAINAGE WOUND WITH NERVE AND ARTERY REPAIR Left 10/04/2017   Procedure: LEFT HAND WOUND EXPLORATION repair as necessary;  Surgeon: Bradly Bienenstock, MD;  Location: [facility] OR;  Service: Orthopedics;  Laterality: Left;   MOHS SURGERY Right 12/24/2022   - Katrine Coho, MD   TEE WITHOUT CARDIOVERSION N/A 02/23/2016   Procedure: TRANSESOPHAGEAL ECHOCARDIOGRAM (TEE);  Surgeon: Chrystie Nose, MD;  Location: [facility] ENDOSCOPY;  Service: Cardiovascular;  Laterality: N/A;  ALSO GETTING A LOOP   TRANSCATHETER AORTIC VALVE REPLACEMENT, TRANSFEMORAL  09/09/2022   Procedure: TRANSCATHETER AORTIC VALVE REPLACEMENT, TRANSFEMORAL; Location: [facility]; Surgeon: Nance Pew, MD   Social History   Socioeconomic History   Marital status: Married    Spouse name: Renea Ee   Number of children: 2   Years of education: HS Grad   Highest education level: 12th grade  Occupational History   Occupation: Retired  Tobacco Use   Smoking status: Former    Types: Cigars    Quit date: 04/08/2012    Years since quitting: 11.8   Smokeless tobacco: Never   Tobacco comments:    cigarettes and  cigars  all quit 12 years ago  Vaping Use   Vaping status: Never Used  Substance and Sexual Activity   Alcohol use: No    Alcohol/week: 0.0 standard drinks of alcohol    Comment: quit 2013   Drug use: No   Sexual activity: Not on file  Other Topics Concern   Not on file  Social History Narrative   Lives at home with wife   Right-handed.   No caffeine use.       Social Drivers of Corporate investment banker Strain: Low Risk  (01/13/2024)   Overall Financial Resource Strain (CARDIA)    Difficulty of Paying Living Expenses: Not hard at all  Food Insecurity: No Food Insecurity (  01/13/2024)   Hunger Vital Sign    Worried About Running Out of Food in the Last Year: Never true    Ran Out of Food in the Last Year: Never true  Transportation Needs: No Transportation Needs (01/13/2024)   PRAPARE - Administrator, Civil Service (Medical): No    Lack of Transportation (Non-Medical): No  Physical Activity: Inactive (01/13/2024)   Exercise Vital Sign    Days of Exercise per Week: 0 days    Minutes of Exercise per Session: 0 min  Stress: No Stress Concern Present (01/13/2024)   [employer] of Occupational Health - Occupational Stress Questionnaire    Feeling of Stress : Only a little  Social Connections: Moderately Isolated (01/13/2024)   Social Connection and Isolation Panel [NHANES]    Frequency of Communication with Friends and Family: More than three times a week    Frequency of Social Gatherings with Friends and Family: Once a week    Attends Religious Services: Never    Database administrator or Organizations: No    Attends Banker Meetings: Never    Marital Status: Married  Catering manager Violence: Not At Risk (01/13/2024)   Humiliation, Afraid, Rape, and Kick questionnaire    Fear of Current or Ex-Partner: No    Emotionally Abused: No    Physically Abused: No    Sexually Abused: No   Family Status  Relation Name Status   Father  Deceased at age 25        Hodgkins   Mother  Deceased at age 27s       DM, Htn   Sister  Alive   Sister  Alive       trouble with knees;Obesity  No partnership data on file   Family History  Problem Relation Age of Onset   Hodgkin's lymphoma Father    Diabetes Mother    Allergies  Allergen Reactions   Pioglitazone     UNSPECIFIED REACTION     Patient Care Team: Malva Limes, MD as PCP - General (Family Medicine) Levert Feinstein, MD as Consulting Physician (Neurology) [facility], [facility], [facility] as Referring Physician (General Practice) Gaspar Cola, [facility] (Inactive) as Pharmacist (Pharmacist) [place] Crane, MD as Consulting Physician (Ophthalmology) Hillis Range, MD (Inactive) as Consulting Physician (Cardiology) Vernie Murders, MD (Otolaryngology)   Medications: Outpatient Medications Prior to Visit  Medication Sig   acyclovir (ZOVIRAX) 400 MG tablet 1/2 tablet up to 5 times a day for cold sores (Patient taking differently: as needed. 1/2 tablet up to 5 times a day for cold sores)   albuterol (VENTOLIN HFA) 108 (90 Base) MCG/ACT inhaler Inhale 2 puffs into the lungs every 6 (six) hours as needed for wheezing or shortness of breath.   atorvastatin (LIPITOR) 20 MG tablet Take 1 tablet (20 mg total) by mouth every evening. (Patient taking differently: Take 40 mg by mouth every evening.)   benzocaine (ORAJEL) 10 % mucosal gel Use as directed 1 application in the mouth or throat as needed for mouth pain.   Cholecalciferol (VITAMIN D PO) Take 2,000 Units by mouth daily.    Docosanol 10 % CREA Apply topically up to 5 times per day as needed until heals   empagliflozin (JARDIANCE) 25 MG TABS tablet Take 1 tablet (25 mg total) by mouth daily.   Flaxseed, Linseed, (FLAXSEED OIL) 1000 MG CAPS Take 1,000 mg by mouth 2 (two) times daily.   fluconazole (DIFLUCAN) 100  MG tablet Take 1 tablet (100 mg total) by mouth daily.   fluticasone (FLONASE) 50 MCG/ACT nasal spray Place  2 sprays into both nostrils daily. (Patient taking differently: Place 2 sprays into both nostrils as needed.)   furosemide (LASIX) 20 MG tablet TAKE ONE (1) TABLET BY MOUTH ONCE DAILY AS NEEDED FOR EDEMA OR WEIGHT GAIN > 2 POUNDS IN 24 HOURS   gabapentin (NEURONTIN) 100 MG capsule Take 1 capsule (100 mg total) by mouth 2 (two) times daily.   HYDROcodone-acetaminophen (NORCO/VICODIN) 5-325 MG tablet Take 1 tablet by mouth every 4 (four) hours as needed for moderate pain.   insulin detemir (LEVEMIR) 100 UNIT/ML injection Inject 0.6 mLs (60 Units total) into the skin 2 (two) times daily.   levETIRAcetam (KEPPRA) 750 MG tablet Take 1 tablet (750 mg total) by mouth 2 (two) times daily.   lisinopril (ZESTRIL) 2.5 MG tablet Take 5 mg by mouth daily.   loratadine (CLARITIN) 10 MG tablet Take 10 mg by mouth daily.   metFORMIN (GLUCOPHAGE) 850 MG tablet Take 1 tablet (850 mg total) by mouth 2 (two) times daily with a meal.   metoprolol tartrate (LOPRESSOR) 25 MG tablet Take 0.5 tablets (12.5 mg total) by mouth every 12 (twelve) hours.   Misc Natural Products (OSTEO BI-FLEX TRIPLE STRENGTH PO) Take 1 tablet by mouth 2 (two) times daily.    MISC NATURAL PRODUCTS PO Take 1 capsule by mouth in the morning and at bedtime. Super Beta Prostate   Omega-3 Fatty Acids (FISH OIL) 1000 MG CPDR Take 1,200 mg by mouth 2 (two) times daily.    PARoxetine (PAXIL) 40 MG tablet Take 1 tablet (40 mg total) by mouth daily.   rivaroxaban (XARELTO) 20 MG TABS tablet TAKE ONE TABLET BY MOUTH DAILY WITH SUPPER   sucralfate (CARAFATE) 1 g tablet Take 0.5 tablets (0.5 g total) by mouth 4 (four) times daily -  with meals and at bedtime. Dissolve half a tablet in 4 tablespoons warm water swish and swallow   sucralfate (CARAFATE) 1 g tablet Take 1 tablet (1 g total) by mouth 4 (four) times daily -  with meals and at bedtime.   No facility-administered medications prior to visit.    Review of Systems    Objective    BP 116/63 (BP  Location: Left Arm, Patient Position: Sitting, Cuff Size: Large)   Pulse 87   Resp 16   Ht 5\' 7"  (1.702 m)   Wt 214 lb (97.1 kg)   SpO2 99%   BMI 33.52 kg/m    Physical Exam  General Appearance:    Mildly obese male. Alert, cooperative, in no acute distress, appears stated age. Voice is very hoarse.   Head:    Normocephalic, without obvious abnormality, atraumatic  Eyes:    PERRL, conjunctiva/corneas clear, EOM's intact, fundi    benign, both eyes       Ears:    Normal TM's and external ear canals, both ears  Nose:   Nares normal, septum midline, mucosa normal, no drainage   or sinus tenderness  Throat:   Lips, mucosa, and tongue normal; teeth and gums normal  Neck:   Supple, symmetrical, trachea midline, no adenopathy;       thyroid:  No enlargement/tenderness/nodules; no carotid   bruit or JVD  Back:     Symmetric, no curvature, ROM normal, no CVA tenderness  Lungs:     Clear to auscultation bilaterally, respirations unlabored  Chest wall:    No tenderness or  deformity  Heart:    Normal heart rate. Irregularly irregular rhythm. No murmurs, rubs, or gallops.  S1 and S2 normal  Abdomen:     Soft, non-tender, bowel sounds active all four quadrants,    no masses, no organomegaly  Genitalia:    deferred  Rectal:    deferred  Extremities:   All extremities are intact. No cyanosis or edema  Pulses:   2+ and symmetric all extremities  Skin:   Skin color, texture, turgor normal, no rashes or lesions  Lymph nodes:   Cervical, supraclavicular, and axillary nodes normal  Neurologic:   CNII-XII intact. Normal strength, sensation and reflexes      throughout       Last depression screening scores    01/13/2024    8:12 AM 04/07/2023    9:34 AM 01/06/2023   10:32 AM  PHQ 2/9 Scores  PHQ - 2 Score 0 3 0  PHQ- 9 Score 0 5 0   Last fall risk screening    01/13/2024    8:14 AM  Fall Risk   Falls in the past year? 1  Number falls in past yr: 1  Injury with Fall? 0  Risk for fall due  to : History of fall(s)  Follow up Falls evaluation completed;Falls prevention discussed   Last Audit-C alcohol use screening    01/13/2024    8:11 AM  Alcohol Use Disorder Test (AUDIT)  1. How often do you have a drink containing alcohol? 0  2. How many drinks containing alcohol do you have on a typical day when you are drinking? 0  3. How often do you have six or more drinks on one occasion? 0  AUDIT-C Score 0   A score of 3 or more in women, and 4 or more in men indicates increased risk for alcohol abuse, EXCEPT if all of the points are from question 1   No results found for any visits on 01/23/24.  Assessment & Plan    Routine Health Maintenance and Physical Exam  Exercise Activities and Dietary recommendations  Goals      Chronic Care Management     CARE PLAN ENTRY (see longitudinal plan of care for additional care plan information)  Current Barriers:  Chronic Disease Management support, education, and care coordination needs related to Hypertension, Hyperlipidemia, Diabetes, Atrial Fibrillation and Allergic Rhinitis    Hypertension BP Readings from Last 3 Encounters:  05/16/20 121/69  04/06/20 (!) 156/79  05/12/19 128/70   Pharmacist Clinical Goal(s): Over the next 90 days, patient will work with PharmD and providers to maintain BP goal <130/80 Current regimen:  Lisinopril 10 mg 1/2 tablet daily  Interventions: Discussed low salt diet and exercising as tolerated extensively Will initiate blood pressure monitoring plan  Patient self care activities - Over the next 90 days, patient will: Check Blood Pressure Weekly, document, and provide at future appointments Ensure daily salt intake < 2300 mg/day  Hyperlipidemia Lab Results  Component Value Date/Time   LDLCALC 49 02/07/2020 12:00 AM   Pharmacist Clinical Goal(s): Over the next 90 days, patient will work with PharmD and providers to achieve LDL goal < 70 Current regimen:  Atorvastatin 40 mg daily   Interventions: Discussed low cholesterol diet and exercising as tolerated extensively Will initiate cholesterol monitoring plan   Diabetes Lab Results  Component Value Date/Time   HGBA1C 7.6 (H) 05/16/2020 10:33 AM   HGBA1C 7.7 02/07/2020 12:00 AM   HGBA1C 7.1 (A) 06/08/2018 08:48 AM  HGBA1C 9.7 02/02/2018 08:46 AM   HGBA1C 7.2 11/06/2017 12:00 AM   Pharmacist Clinical Goal(s): Over the next 90 days, patient will work with PharmD and providers to maintain A1c goal <8% Current regimen:  Jardiance 25 mg daily Levemir 60 units twice daily  Metformin 850 mg twice daily Interventions: Discussed carbohydrate counting and exercising as tolerated extensively Will initiate blood sugar monitoring plan  Patient self care activities - Over the next 90 days, patient will: Check blood sugar once daily, document, and provide at future appointments Contact provider with any episodes of hypoglycemia  Medication management Pharmacist Clinical Goal(s): Over the next 90 days, patient will work with PharmD and providers to maintain optimal medication adherence [facility]  Interventions Comprehensive medication review performed. Continue current medication management strategy Patient self care activities - Over the next 90 days, patient will: Take medications as prescribed Report any questions or concerns to PharmD and/or provider(s)     Cut out extra servings     DIET - EAT MORE FRUITS AND VEGETABLES     Recommend to eat 2 servings of fruits and vegetables each a day.       DIET - EAT MORE FRUITS AND VEGETABLES     DIET - INCREASE WATER INTAKE     DIET - REDUCE SUGAR INTAKE     Recommend to cut out sugar and any desserts in diet to help aid in weight loss.       Monitor and Manage My Blood Sugar-Diabetes Type 2     Timeframe:  Long-Range Goal Priority:  High Start Date: 01/16/21                             Expected End Date: 07/16/21                       Follow Up Date 04/07/2021    - check blood sugar at prescribed times - check blood sugar if I feel it is too high or too low - take the blood sugar meter to all doctor visits    Why is this important?   Checking your blood sugar at home helps to keep it from getting very high or very low.  Writing the results in a diary or log helps the doctor know how to care for you.  Your blood sugar log should have the time, date and the results.  Also, write down the amount of insulin or other medicine that you take.  Other information, like what you ate, exercise done and how you were feeling, will also be helpful.     Notes:      Prevent falls     Recommend to remove any items from the home that may cause slips or trips.        Immunization History  Administered Date(s) Administered   Fluad Quad(high Dose 65+) 09/28/2019, 10/11/2020, 10/17/2021, 09/11/2022   Influenza, High Dose Seasonal PF 08/25/2015, 09/13/2016, 08/01/2017, 08/19/2018   Influenza, Seasonal, Injecte, Preservative Fre 09/23/2014   Influenza-Unspecified 08/09/2013, 09/23/2014, 09/20/2015, 08/08/2017, 08/10/2019, 10/17/2021, 10/24/2023   PFIZER(Purple Top)SARS-COV-2 Vaccination 02/08/2020, 02/29/2020, 10/03/2020   Pfizer(Comirnaty)Fall Seasonal Vaccine 12 years and older 12/26/2022   Pneumococcal Conjugate-13 10/17/2014, 11/01/2014   Pneumococcal Polysaccharide-23 11/15/2008, 04/27/2012, 10/08/2013   Pneumococcal-Unspecified 07/09/2017   Tdap 04/17/2010, 02/14/2011, 05/09/2013, 07/23/2013, 09/15/2017, 01/29/2023   Zoster Recombinant(Shingrix) 08/20/2021, 02/14/2022   Zoster, Live 09/23/2014    Health  Maintenance  Topic Date Due   OPHTHALMOLOGY EXAM  02/14/2022   Diabetic kidney evaluation - Urine ACR  01/25/2023   COVID-19 Vaccine (5 - 2024-25 season) 08/10/2023   HEMOGLOBIN A1C  10/07/2023   Diabetic kidney evaluation - eGFR measurement  03/16/2024   Medicare Annual Wellness (AWV)  01/12/2025   DTaP/Tdap/Td (7 -  Td or Tdap) 01/29/2033   Pneumonia Vaccine 10+ Years old  Completed   INFLUENZA VACCINE  Completed   Hepatitis C Screening  Completed   Zoster Vaccines- Shingrix  Completed   HPV VACCINES  Aged Out   Lung Cancer Screening  Discontinued   Fecal DNA (Cologuard)  Discontinued    Discussed health benefits of physical activity, and encouraged him to engage in regular exercise appropriate for his age and condition.    2. Laryngitis Multifactorial related to recent treatments for SCC of tongue. Recent flare likely related to viral infection, although risk of secondary infection is concerning.  - azithromycin (ZITHROMAX) 250 MG tablet; Take 2 tablets on day 1, then 1 tablet daily on days 2 through 5  Dispense: 6 tablet; Refill: 0   3. Primary hypertension Well controlled.  Continue current medications.   - Magnesium  4. Type 2 diabetes mellitus with hyperglycemia, unspecified whether long term insulin use (HCC)  - CBC - Comprehensive metabolic panel - Lipid panel - Hemoglobin A1c - Urine Albumin/Creatinine with ratio (send out) [LAB689]  5. B12 deficiency  - VITAMIN D 25 Hydroxy (Vit-D Deficiency, Fractures)  6. Vitamin D deficiency  - Vitamin B12  7. HFrEF (heart failure with reduced ejection fraction) (HCC) Fairly well compensated. Is to continue routine follow up with cardiology.   8. Elevated TSH  - TSH - T4, free         Mila Merry, MD  [facility] [phone] (phone) [phone] (fax)  [facility]

## 2024-01-27 ENCOUNTER — Encounter: Payer: Self-pay | Admitting: Family Medicine

## 2024-01-27 ENCOUNTER — Ambulatory Visit (INDEPENDENT_AMBULATORY_CARE_PROVIDER_SITE_OTHER): Payer: Medicare HMO | Admitting: Family Medicine

## 2024-01-27 VITALS — BP 110/72 | HR 80 | Resp 16 | Wt 217.4 lb

## 2024-01-27 DIAGNOSIS — J040 Acute laryngitis: Secondary | ICD-10-CM

## 2024-01-27 MED ORDER — PREDNISONE 10 MG PO TABS
ORAL_TABLET | ORAL | 0 refills | Status: AC
Start: 2024-01-27 — End: 2024-02-02

## 2024-01-27 MED ORDER — AMOXICILLIN-POT CLAVULANATE 875-125 MG PO TABS: 1.0000 | ORAL_TABLET | Freq: Two times a day (BID) | ORAL | 0 refills | Status: AC

## 2024-02-02 DIAGNOSIS — K122 Cellulitis and abscess of mouth: Secondary | ICD-10-CM | POA: Diagnosis not present

## 2024-02-02 DIAGNOSIS — Z85810 Personal history of malignant neoplasm of tongue: Secondary | ICD-10-CM | POA: Diagnosis not present

## 2024-02-02 DIAGNOSIS — R490 Dysphonia: Secondary | ICD-10-CM | POA: Diagnosis not present

## 2024-02-03 ENCOUNTER — Other Ambulatory Visit: Payer: Self-pay | Admitting: Otolaryngology

## 2024-02-03 DIAGNOSIS — Z85810 Personal history of malignant neoplasm of tongue: Secondary | ICD-10-CM

## 2024-02-07 NOTE — Progress Notes (Unsigned)
 Established patient visit   Patient: Sean Hayden   DOB: 1946-07-01   78 y.o. Male  MRN: [id number] Visit Date: 01/27/2024  Today's healthcare provider: Mila Merry, MD   No chief complaint on file.  Subjective    Discussed the use of AI scribe software for clinical note transcription with the patient, who gave verbal consent to proceed.  History of Present Illness   Sean Hayden is a 78 year old male who presents with persistent voice issues.  He has been experiencing persistent voice issues for the last few weeks. He was seen for CPE on 2/14 and prescribed azithromycin as he has history of cancer of tongue and recently had partial glossectomy. He uses a spray called Betaine to keep his mouth and throat moisturized, although it causes a burning sensation.  He has a history of using prednisone, which can cause side effects such as trouble sleeping, increased appetite, and swelling in the legs. He is aware that prednisone can also elevate blood sugar levels.       Medications: Outpatient Medications Prior to Visit  Medication Sig   acyclovir (ZOVIRAX) 400 MG tablet 1/2 tablet up to 5 times a day for cold sores (Patient taking differently: as needed. 1/2 tablet up to 5 times a day for cold sores)   albuterol (VENTOLIN HFA) 108 (90 Base) MCG/ACT inhaler Inhale 2 puffs into the lungs every 6 (six) hours as needed for wheezing or shortness of breath.   atorvastatin (LIPITOR) 20 MG tablet Take 1 tablet (20 mg total) by mouth every evening. (Patient taking differently: Take 40 mg by mouth every evening.)   [EXPIRED] azithromycin (ZITHROMAX) 250 MG tablet Take 2 tablets on day 1, then 1 tablet daily on days 2 through 5   benzocaine (ORAJEL) 10 % mucosal gel Use as directed 1 application in the mouth or throat as needed for mouth pain.   Cholecalciferol (VITAMIN D PO) Take 2,000 Units by mouth daily.    Docosanol 10 % CREA Apply topically up to 5 times per day as needed until  heals   empagliflozin (JARDIANCE) 25 MG TABS tablet Take 1 tablet (25 mg total) by mouth daily.   Flaxseed, Linseed, (FLAXSEED OIL) 1000 MG CAPS Take 1,000 mg by mouth 2 (two) times daily.   fluconazole (DIFLUCAN) 100 MG tablet Take 1 tablet (100 mg total) by mouth daily.   fluticasone (FLONASE) 50 MCG/ACT nasal spray Place 2 sprays into both nostrils daily. (Patient taking differently: Place 2 sprays into both nostrils as needed.)   furosemide (LASIX) 20 MG tablet TAKE ONE (1) TABLET BY MOUTH ONCE DAILY AS NEEDED FOR EDEMA OR WEIGHT GAIN > 2 POUNDS IN 24 HOURS   gabapentin (NEURONTIN) 100 MG capsule Take 1 capsule (100 mg total) by mouth 2 (two) times daily.   [EXPIRED] HYDROcodone-acetaminophen (NORCO/VICODIN) 5-325 MG tablet Take 1 tablet by mouth every 4 (four) hours as needed for moderate pain.   insulin detemir (LEVEMIR) 100 UNIT/ML injection Inject 0.6 mLs (60 Units total) into the skin 2 (two) times daily.   levETIRAcetam (KEPPRA) 750 MG tablet Take 1 tablet (750 mg total) by mouth 2 (two) times daily.   lisinopril (ZESTRIL) 2.5 MG tablet Take 5 mg by mouth daily.   loratadine (CLARITIN) 10 MG tablet Take 10 mg by mouth daily.   metFORMIN (GLUCOPHAGE) 850 MG tablet Take 1 tablet (850 mg total) by mouth 2 (two) times daily with a meal.   metoprolol tartrate (LOPRESSOR)  25 MG tablet Take 0.5 tablets (12.5 mg total) by mouth every 12 (twelve) hours.   Misc Natural Products (OSTEO BI-FLEX TRIPLE STRENGTH PO) Take 1 tablet by mouth 2 (two) times daily.    MISC NATURAL PRODUCTS PO Take 1 capsule by mouth in the morning and at bedtime. Super Beta Prostate   Omega-3 Fatty Acids (FISH OIL) 1000 MG CPDR Take 1,200 mg by mouth 2 (two) times daily.    PARoxetine (PAXIL) 40 MG tablet Take 1 tablet (40 mg total) by mouth daily.   rivaroxaban (XARELTO) 20 MG TABS tablet TAKE ONE TABLET BY MOUTH DAILY WITH SUPPER   sucralfate (CARAFATE) 1 g tablet Take 0.5 tablets (0.5 g total) by mouth 4 (four) times  daily -  with meals and at bedtime. Dissolve half a tablet in 4 tablespoons warm water swish and swallow   sucralfate (CARAFATE) 1 g tablet Take 1 tablet (1 g total) by mouth 4 (four) times daily -  with meals and at bedtime.   No facility-administered medications prior to visit.   Review of Systems {Insert previous labs (optional):23779} {See past labs  Heme  Chem  Endocrine  Serology  Results Review (optional):1}   Objective    BP 110/72 (BP Location: Left Arm, Patient Position: Sitting, Cuff Size: Large)   Pulse 80   Resp 16   Wt 217 lb 6.4 oz (98.6 kg)   BMI 34.05 kg/m {Insert last BP/Wt (optional):23777}{See vitals history (optional):1}  Physical Exam  OP clear, no LAD, very hoarse voice  Assessment & Plan       Laryngitis Persistent voice changes, likely due to viral infection and inflammation. ENT consultation recommended but patient has upcoming knee surgery. -Start Prednisone taper to reduce inflammation. -Start Augmentin to cover potential bacterial infection. -Encourage hydration and use of Biotene spray for throat moisturization and to contact his ENT for further evaluation.    No follow-ups on file.      Mila Merry, MD  [facility] [phone] (phone) [phone] (fax)  [facility]

## 2024-02-09 ENCOUNTER — Ambulatory Visit
Admission: RE | Admit: 2024-02-09 | Discharge: 2024-02-09 | Disposition: A | Discharge status: Home / Self Care | Payer: Medicare HMO | Source: Ambulatory Visit | Attending: Otolaryngology | Admitting: Otolaryngology

## 2024-02-09 DIAGNOSIS — Z85810 Personal history of malignant neoplasm of tongue: Secondary | ICD-10-CM

## 2024-02-09 MED ORDER — IOPAMIDOL (ISOVUE-370) INJECTION 76%
75.0000 mL | Freq: Once | INTRAVENOUS | Status: AC | PRN
Start: 2024-02-09 — End: 2024-02-09
  Administered 2024-02-09: 75 mL via INTRAVENOUS

## 2024-03-01 ENCOUNTER — Observation Stay
Admission: EM | Admit: 2024-03-01 | Discharge: 2024-03-08 | Disposition: A | Discharge status: Skilled Nursing Facility | Attending: Internal Medicine | Admitting: Internal Medicine

## 2024-03-01 ENCOUNTER — Other Ambulatory Visit: Payer: Self-pay

## 2024-03-01 ENCOUNTER — Emergency Department

## 2024-03-01 ENCOUNTER — Encounter: Payer: Self-pay | Admitting: Family Medicine

## 2024-03-01 DIAGNOSIS — J45909 Unspecified asthma, uncomplicated: Secondary | ICD-10-CM | POA: Insufficient documentation

## 2024-03-01 DIAGNOSIS — I5020 Unspecified systolic (congestive) heart failure: Secondary | ICD-10-CM | POA: Insufficient documentation

## 2024-03-01 DIAGNOSIS — E1140 Type 2 diabetes mellitus with diabetic neuropathy, unspecified: Secondary | ICD-10-CM

## 2024-03-01 DIAGNOSIS — E860 Dehydration: Secondary | ICD-10-CM | POA: Diagnosis not present

## 2024-03-01 DIAGNOSIS — Z1152 Encounter for screening for COVID-19: Secondary | ICD-10-CM | POA: Diagnosis not present

## 2024-03-01 DIAGNOSIS — Z85818 Personal history of malignant neoplasm of other sites of lip, oral cavity, and pharynx: Secondary | ICD-10-CM | POA: Insufficient documentation

## 2024-03-01 DIAGNOSIS — I2510 Atherosclerotic heart disease of native coronary artery without angina pectoris: Secondary | ICD-10-CM | POA: Diagnosis not present

## 2024-03-01 DIAGNOSIS — S51011A Laceration without foreign body of right elbow, initial encounter: Secondary | ICD-10-CM | POA: Diagnosis not present

## 2024-03-01 DIAGNOSIS — E559 Vitamin D deficiency, unspecified: Secondary | ICD-10-CM | POA: Diagnosis not present

## 2024-03-01 DIAGNOSIS — I4891 Unspecified atrial fibrillation: Secondary | ICD-10-CM | POA: Insufficient documentation

## 2024-03-01 DIAGNOSIS — F32A Depression, unspecified: Secondary | ICD-10-CM | POA: Diagnosis present

## 2024-03-01 DIAGNOSIS — Z87891 Personal history of nicotine dependence: Secondary | ICD-10-CM | POA: Insufficient documentation

## 2024-03-01 DIAGNOSIS — Z23 Encounter for immunization: Secondary | ICD-10-CM | POA: Insufficient documentation

## 2024-03-01 DIAGNOSIS — E785 Hyperlipidemia, unspecified: Secondary | ICD-10-CM | POA: Insufficient documentation

## 2024-03-01 DIAGNOSIS — R531 Weakness: Secondary | ICD-10-CM | POA: Diagnosis present

## 2024-03-01 DIAGNOSIS — Z7901 Long term (current) use of anticoagulants: Secondary | ICD-10-CM | POA: Diagnosis not present

## 2024-03-01 DIAGNOSIS — G40909 Epilepsy, unspecified, not intractable, without status epilepticus: Secondary | ICD-10-CM | POA: Insufficient documentation

## 2024-03-01 DIAGNOSIS — I110 Hypertensive heart disease with heart failure: Secondary | ICD-10-CM | POA: Insufficient documentation

## 2024-03-01 DIAGNOSIS — S51019A Laceration without foreign body of unspecified elbow, initial encounter: Secondary | ICD-10-CM

## 2024-03-01 DIAGNOSIS — Z794 Long term (current) use of insulin: Secondary | ICD-10-CM | POA: Diagnosis not present

## 2024-03-01 DIAGNOSIS — Z79899 Other long term (current) drug therapy: Secondary | ICD-10-CM | POA: Diagnosis not present

## 2024-03-01 DIAGNOSIS — W19XXXA Unspecified fall, initial encounter: Secondary | ICD-10-CM | POA: Diagnosis not present

## 2024-03-01 DIAGNOSIS — Z8673 Personal history of transient ischemic attack (TIA), and cerebral infarction without residual deficits: Secondary | ICD-10-CM | POA: Diagnosis not present

## 2024-03-01 DIAGNOSIS — Z7984 Long term (current) use of oral hypoglycemic drugs: Secondary | ICD-10-CM | POA: Diagnosis not present

## 2024-03-01 DIAGNOSIS — E1142 Type 2 diabetes mellitus with diabetic polyneuropathy: Secondary | ICD-10-CM | POA: Insufficient documentation

## 2024-03-01 DIAGNOSIS — I10 Essential (primary) hypertension: Secondary | ICD-10-CM | POA: Insufficient documentation

## 2024-03-01 DIAGNOSIS — I480 Paroxysmal atrial fibrillation: Secondary | ICD-10-CM | POA: Insufficient documentation

## 2024-03-01 LAB — BASIC METABOLIC PANEL
Anion gap: 6 (ref 5–15)
BUN: 11 mg/dL (ref 8–23)
CO2: 28 mmol/L (ref 22–32)
Calcium: 9 mg/dL (ref 8.9–10.3)
Chloride: 105 mmol/L (ref 98–111)
Creatinine, Ser: 0.69 mg/dL (ref 0.61–1.24)
GFR, Estimated: >60 mL/min (ref >60)
Glucose, Bld: 151 mg/dL — ABNORMAL HIGH (ref 70–99)
Potassium: 4.6 mmol/L (ref 3.5–5.1)
Sodium: 139 mmol/L (ref 135–145)

## 2024-03-01 LAB — COMPREHENSIVE METABOLIC PANEL
ALT: 18 U/L (ref 0–44)
AST: 22 U/L (ref 15–41)
Albumin: 3.5 g/dL (ref 3.5–5.0)
Alkaline Phosphatase: 75 U/L (ref 38–126)
Anion gap: 9 (ref 5–15)
BUN: 11 mg/dL (ref 8–23)
CO2: 27 mmol/L (ref 22–32)
Calcium: 9.2 mg/dL (ref 8.9–10.3)
Chloride: 101 mmol/L (ref 98–111)
Creatinine, Ser: 0.72 mg/dL (ref 0.61–1.24)
GFR, Estimated: >60 mL/min (ref >60)
Glucose, Bld: 164 mg/dL — ABNORMAL HIGH (ref 70–99)
Potassium: 4.7 mmol/L (ref 3.5–5.1)
Sodium: 137 mmol/L (ref 135–145)
Total Bilirubin: 0.9 mg/dL (ref 0.0–1.2)
Total Protein: 6.9 g/dL (ref 6.5–8.1)

## 2024-03-01 LAB — CBC WITH DIFFERENTIAL/PLATELET
Abs Immature Granulocytes: 0.04 10*3/uL (ref 0.00–0.07)
Basophils Absolute: 0 10*3/uL (ref 0.0–0.1)
Basophils Relative: 0 %
Eosinophils Absolute: 0.1 10*3/uL (ref 0.0–0.5)
Eosinophils Relative: 1 %
HCT: 48.6 % (ref 39.0–52.0)
Hemoglobin: 16.2 g/dL (ref 13.0–17.0)
Immature Granulocytes: 1 %
Lymphocytes Relative: 11 %
Lymphs Abs: 0.9 10*3/uL (ref 0.7–4.0)
MCH: 30.9 pg (ref 26.0–34.0)
MCHC: 33.3 g/dL (ref 30.0–36.0)
MCV: 92.6 fL (ref 80.0–100.0)
Monocytes Absolute: 0.7 10*3/uL (ref 0.1–1.0)
Monocytes Relative: 9 %
Neutro Abs: 6 10*3/uL (ref 1.7–7.7)
Neutrophils Relative %: 78 %
Platelets: 192 10*3/uL (ref 150–400)
RBC: 5.25 MIL/uL (ref 4.22–5.81)
RDW: 14.1 % (ref 11.5–15.5)
WBC: 7.7 10*3/uL (ref 4.0–10.5)
nRBC: 0 % (ref 0.0–0.2)

## 2024-03-01 LAB — CBC
HCT: 46 % (ref 39.0–52.0)
Hemoglobin: 15.1 g/dL (ref 13.0–17.0)
MCH: 31.2 pg (ref 26.0–34.0)
MCHC: 32.8 g/dL (ref 30.0–36.0)
MCV: 95 fL (ref 80.0–100.0)
Platelets: 175 10*3/uL (ref 150–400)
RBC: 4.84 MIL/uL (ref 4.22–5.81)
RDW: 13.9 % (ref 11.5–15.5)
WBC: 5.9 10*3/uL (ref 4.0–10.5)
nRBC: 0 % (ref 0.0–0.2)

## 2024-03-01 LAB — CBG MONITORING, ED
Glucose-Capillary: 136 mg/dL — ABNORMAL HIGH (ref 70–99)
Glucose-Capillary: 133 mg/dL — ABNORMAL HIGH (ref 70–99)
Glucose-Capillary: 80 mg/dL (ref 70–99)
Glucose-Capillary: 88 mg/dL (ref 70–99)
Glucose-Capillary: 95 mg/dL (ref 70–99)

## 2024-03-01 LAB — URINALYSIS, W/ REFLEX TO CULTURE (INFECTION SUSPECTED)
Bacteria, UA: NONE SEEN
Bilirubin Urine: NEGATIVE
Glucose, UA: >=500 mg/dL — AB
Hgb urine dipstick: NEGATIVE
Ketones, ur: 20 mg/dL — AB
Leukocytes,Ua: NEGATIVE
Nitrite: NEGATIVE
Protein, ur: NEGATIVE mg/dL
Specific Gravity, Urine: 1.014 (ref 1.005–1.030)
Squamous Epithelial / HPF: 0 /HPF (ref 0–5)
pH: 5 (ref 5.0–8.0)

## 2024-03-01 LAB — PROTIME-INR
INR: 1.1 (ref 0.8–1.2)
Prothrombin Time: 14.9 s (ref 11.4–15.2)

## 2024-03-01 LAB — MAGNESIUM
Magnesium: 1.4 mg/dL — ABNORMAL LOW (ref 1.7–2.4)
Magnesium: 1.9 mg/dL (ref 1.7–2.4)

## 2024-03-01 LAB — LACTIC ACID, PLASMA: Lactic Acid, Venous: 1.3 mmol/L (ref 0.5–1.9)

## 2024-03-01 LAB — TROPONIN I (HIGH SENSITIVITY)
Troponin I (High Sensitivity): 14 ng/L (ref <18)
Troponin I (High Sensitivity): 13 ng/L (ref <18)

## 2024-03-01 LAB — RESP PANEL BY RT-PCR (RSV, FLU A&B, COVID)  RVPGX2
Influenza A by PCR: NEGATIVE
Influenza B by PCR: NEGATIVE
Resp Syncytial Virus by PCR: NEGATIVE
SARS Coronavirus 2 by RT PCR: NEGATIVE

## 2024-03-01 LAB — HEMOGLOBIN A1C
Hgb A1c MFr Bld: 7.1 % — ABNORMAL HIGH (ref 4.8–5.6)
Mean Plasma Glucose: 157.07 mg/dL

## 2024-03-01 LAB — TSH: TSH: 3.046 u[IU]/mL (ref 0.350–4.500)

## 2024-03-01 MED ORDER — ONDANSETRON HCL 4 MG PO TABS: 4.0000 mg | ORAL_TABLET | Freq: Four times a day (QID) | ORAL | Status: DC | PRN

## 2024-03-01 MED ORDER — SUCRALFATE 1 G PO TABS: 0.5000 g | ORAL_TABLET | Freq: Three times a day (TID) | ORAL | Status: DC

## 2024-03-01 MED ORDER — TETANUS-DIPHTH-ACELL PERTUSSIS 5-2.5-18.5 LF-MCG/0.5 IM SUSY
0.5000 mL | PREFILLED_SYRINGE | Freq: Once | INTRAMUSCULAR | Status: AC
  Administered 2024-03-01: 0.5 mL via INTRAMUSCULAR
  Filled 2024-03-01: qty 0.5

## 2024-03-01 MED ORDER — LISINOPRIL 5 MG PO TABS
5.0000 mg | ORAL_TABLET | Freq: Every day | ORAL | Status: DC
  Administered 2024-03-01 – 2024-03-07 (×6): 5 mg via ORAL
  Filled 2024-03-01 (×7): qty 1

## 2024-03-01 MED ORDER — ACETAMINOPHEN 650 MG RE SUPP: 650.0000 mg | Freq: Four times a day (QID) | RECTAL | Status: DC | PRN

## 2024-03-01 MED ORDER — FLUTICASONE PROPIONATE 50 MCG/ACT NA SUSP: 2.0000 | NASAL | Status: DC | PRN

## 2024-03-01 MED ORDER — ATORVASTATIN CALCIUM 20 MG PO TABS
40.0000 mg | ORAL_TABLET | Freq: Every evening | ORAL | Status: DC
  Administered 2024-03-01 – 2024-03-07 (×6): 40 mg via ORAL
  Filled 2024-03-01 (×7): qty 2

## 2024-03-01 MED ORDER — LEVETIRACETAM 750 MG PO TABS
750.0000 mg | ORAL_TABLET | Freq: Two times a day (BID) | ORAL | Status: DC
  Administered 2024-03-01 – 2024-03-08 (×14): 750 mg via ORAL
  Filled 2024-03-01 (×15): qty 1

## 2024-03-01 MED ORDER — SODIUM CHLORIDE 0.9 % IV BOLUS
500.0000 mL | Freq: Once | INTRAVENOUS | Status: AC
  Administered 2024-03-01: 500 mL via INTRAVENOUS

## 2024-03-01 MED ORDER — VITAMIN D 25 MCG (1000 UNIT) PO TABS
2000.0000 [IU] | ORAL_TABLET | Freq: Every day | ORAL | Status: DC
  Administered 2024-03-01 – 2024-03-08 (×8): 2000 [IU] via ORAL
  Filled 2024-03-01 (×8): qty 2

## 2024-03-01 MED ORDER — METOPROLOL TARTRATE 25 MG PO TABS
12.5000 mg | ORAL_TABLET | Freq: Two times a day (BID) | ORAL | Status: DC
  Administered 2024-03-01 – 2024-03-07 (×12): 12.5 mg via ORAL
  Filled 2024-03-01 (×13): qty 1

## 2024-03-01 MED ORDER — INSULIN ASPART 100 UNIT/ML IJ SOLN: 0.0000 [IU] | Freq: Every day | INTRAMUSCULAR | Status: DC

## 2024-03-01 MED ORDER — GABAPENTIN 100 MG PO CAPS
100.0000 mg | ORAL_CAPSULE | Freq: Two times a day (BID) | ORAL | Status: DC
  Administered 2024-03-01 – 2024-03-08 (×14): 100 mg via ORAL
  Filled 2024-03-01 (×14): qty 1

## 2024-03-01 MED ORDER — INSULIN GLARGINE-YFGN 100 UNIT/ML ~~LOC~~ SOLN
60.0000 [IU] | Freq: Two times a day (BID) | SUBCUTANEOUS | Status: DC
  Administered 2024-03-01: 60 [IU] via SUBCUTANEOUS
  Filled 2024-03-01 (×5): qty 0.6

## 2024-03-01 MED ORDER — INSULIN ASPART 100 UNIT/ML IJ SOLN
0.0000 [IU] | Freq: Three times a day (TID) | INTRAMUSCULAR | Status: DC
  Administered 2024-03-01 (×2): 2 [IU] via SUBCUTANEOUS
  Filled 2024-03-01 (×2): qty 1

## 2024-03-01 MED ORDER — ONDANSETRON HCL 4 MG/2ML IJ SOLN: 4.0000 mg | Freq: Four times a day (QID) | INTRAMUSCULAR | Status: DC | PRN

## 2024-03-01 MED ORDER — EMPAGLIFLOZIN 25 MG PO TABS
25.0000 mg | ORAL_TABLET | Freq: Every day | ORAL | Status: DC
  Administered 2024-03-01 – 2024-03-08 (×8): 25 mg via ORAL
  Filled 2024-03-01 (×8): qty 1

## 2024-03-01 MED ORDER — BENZOCAINE 10 % MT GEL: 1.0000 | OROMUCOSAL | Status: DC | PRN

## 2024-03-01 MED ORDER — TRAZODONE HCL 50 MG PO TABS
25.0000 mg | ORAL_TABLET | Freq: Every evening | ORAL | Status: DC | PRN
  Administered 2024-03-06: 25 mg via ORAL
  Filled 2024-03-01: qty 1

## 2024-03-01 MED ORDER — SUCRALFATE 1 G PO TABS: 1.0000 g | ORAL_TABLET | Freq: Three times a day (TID) | ORAL | Status: DC

## 2024-03-01 MED ORDER — FLAXSEED OIL 1000 MG PO CAPS: 1000.0000 mg | ORAL_CAPSULE | Freq: Two times a day (BID) | ORAL | Status: DC

## 2024-03-01 MED ORDER — ACETAMINOPHEN 325 MG PO TABS
650.0000 mg | ORAL_TABLET | Freq: Four times a day (QID) | ORAL | Status: DC | PRN
  Filled 2024-03-01: qty 2

## 2024-03-01 MED ORDER — RIVAROXABAN 20 MG PO TABS
20.0000 mg | ORAL_TABLET | Freq: Every day | ORAL | Status: DC
  Administered 2024-03-01 – 2024-03-02 (×2): 20 mg via ORAL
  Filled 2024-03-01 (×4): qty 1

## 2024-03-01 MED ORDER — ALBUTEROL SULFATE (2.5 MG/3ML) 0.083% IN NEBU: 2.5000 mg | INHALATION_SOLUTION | Freq: Four times a day (QID) | RESPIRATORY_TRACT | Status: DC | PRN

## 2024-03-01 MED ORDER — LORATADINE 10 MG PO TABS
10.0000 mg | ORAL_TABLET | Freq: Every day | ORAL | Status: DC | PRN
  Administered 2024-03-03: 10 mg via ORAL
  Filled 2024-03-01: qty 1

## 2024-03-01 MED ORDER — MAGNESIUM HYDROXIDE 400 MG/5ML PO SUSP: 30.0000 mL | Freq: Every day | ORAL | Status: DC | PRN

## 2024-03-01 MED ORDER — SODIUM CHLORIDE 0.9 % IV SOLN: INTRAVENOUS | Status: AC

## 2024-03-01 MED ORDER — DILTIAZEM HCL 25 MG/5ML IV SOLN
10.0000 mg | Freq: Once | INTRAVENOUS | Status: AC
  Administered 2024-03-01: 10 mg via INTRAVENOUS
  Filled 2024-03-01: qty 5

## 2024-03-01 MED ORDER — INSULIN ASPART 100 UNIT/ML IJ SOLN: 0.0000 [IU] | Freq: Three times a day (TID) | INTRAMUSCULAR | Status: DC

## 2024-03-01 MED ORDER — MAGNESIUM SULFATE 2 GM/50ML IV SOLN
2.0000 g | Freq: Once | INTRAVENOUS | Status: AC
  Administered 2024-03-01: 2 g via INTRAVENOUS
  Filled 2024-03-01: qty 50

## 2024-03-01 MED ORDER — PAROXETINE HCL 20 MG PO TABS
40.0000 mg | ORAL_TABLET | Freq: Every day | ORAL | Status: DC
  Administered 2024-03-01 – 2024-03-08 (×8): 40 mg via ORAL
  Filled 2024-03-01 (×8): qty 2

## 2024-03-01 MED ORDER — OMEGA-3-ACID ETHYL ESTERS 1 G PO CAPS
1.0000 g | ORAL_CAPSULE | Freq: Two times a day (BID) | ORAL | Status: DC
  Administered 2024-03-02 – 2024-03-08 (×11): 1 g via ORAL
  Filled 2024-03-01 (×13): qty 1

## 2024-03-01 MED ORDER — ALBUTEROL SULFATE HFA 108 (90 BASE) MCG/ACT IN AERS: 2.0000 | INHALATION_SPRAY | Freq: Four times a day (QID) | RESPIRATORY_TRACT | Status: DC | PRN

## 2024-03-01 NOTE — Assessment & Plan Note (Signed)
-   We will continue continue Keppra.

## 2024-03-01 NOTE — Assessment & Plan Note (Signed)
 -  We will continue statin therapy.

## 2024-03-01 NOTE — Evaluation (Signed)
 Occupational Therapy Evaluation Patient Details Name: Sean Hayden MRN: [id number] DOB: [birth year] Today's Date: 03/01/2024   History of Present Illness   78 y/o male presented to ED on 03/01/24 following a fall. Admitted with Afib with RVR. PMH: aortic stenosis, carotid stenosis, gout, asthma, T2DM, oral cancer status post partial glossectomy, HTN, HFrEF, seizure disorder, , s/p TAVR   Clinical Impressions Mr Vanderwoude was seen for OT evaluation this date. Prior to hospital admission, pt was MOD I using cane. Pt lives with spouse in home c 8 STE. On arrival pt seated at end of bed, poor safety awareness and questionable orientation noted (cues and multiple attempts to state correct month and city).   Pt currently requires MAX A don B socks in sitting. MIN A + HHA for ADL t/f ~6 ft (distance limited by lines attached to bed). Pt would benefit from skilled OT to address noted impairments and functional limitations (see below for any additional details). Upon hospital discharge, recommend OT follow up <3 hours/day.    If plan is discharge home, recommend the following:   A little help with walking and/or transfers;A lot of help with bathing/dressing/bathroom;Help with stairs or ramp for entrance;Supervision due to cognitive status     Functional Status Assessment   Patient has had a recent decline in their functional status and demonstrates the ability to make significant improvements in function in a reasonable and predictable amount of time.     Equipment Recommendations   Other (comment) (defer)     Recommendations for Other Services         Precautions/Restrictions   Precautions Precautions: Fall Recall of Precautions/Restrictions: Impaired Restrictions Weight Bearing Restrictions Per Provider Order: No     Mobility Bed Mobility Overal bed mobility: Needs Assistance Bed Mobility: Sit to Supine       Sit to supine: Contact guard assist         Transfers Overall transfer level: Needs assistance Equipment used: 1 person hand held assist Transfers: Sit to/from Stand Sit to Stand: Min assist                  Balance Overall balance assessment: Needs assistance Sitting-balance support: No upper extremity supported, Feet supported Sitting balance-Leahy Scale: Fair     Standing balance support: Single extremity supported Standing balance-Leahy Scale: Poor                             ADL either performed or assessed with clinical judgement   ADL Overall ADL's : Needs assistance/impaired                                       General ADL Comments: MAX A don B socks. MIN A + HHA for ADL t/f ~6 ft (distance limited by lines attached to bed).      Pertinent Vitals/Pain Pain Assessment Pain Assessment: No/denies pain     Extremity/Trunk Assessment Upper Extremity Assessment Upper Extremity Assessment: Defer to OT evaluation   Lower Extremity Assessment Lower Extremity Assessment: Generalized weakness       Communication Communication Communication: No apparent difficulties Factors Affecting Communication: Difficulty expressing self   Cognition Arousal: Lethargic, Alert Behavior During Therapy: Impulsive Cognition: No family/caregiver present to determine baseline             OT - Cognition Comments: oreinted to time/location with cues  and multiple attempts (initially states Sept and [place]). Upon returning to bed becomes lethargic, requires repeated cueing to answer questions                 Following commands: Impaired Following commands impaired: Follows one step commands with increased time     Cueing  General Comments   Cueing Techniques: Verbal cues;Gestural cues      Exercises     Shoulder Instructions      Home Living Family/patient expects to be discharged to:: Private residence Living Arrangements: Spouse/significant other Available Help at  Discharge: Family;Available 24 hours/day Type of Home: House Home Access: Stairs to enter [employer] of Steps: 8 Entrance Stairs-Rails: Right;Left;Can reach both Home Layout: Two level               Home Equipment: Cane - single point          Prior Functioning/Environment Prior Level of Function : Independent/Modified Independent;History of Falls (last six months)             Mobility Comments: report 5-6 falls in past 6 months, uses SPC for mobility      OT Problem List: Decreased range of motion;Decreased strength;Decreased activity tolerance;Impaired balance (sitting and/or standing)   OT Treatment/Interventions: Self-care/ADL training;Therapeutic exercise;Energy conservation;DME and/or AE instruction;Therapeutic activities;Balance training;Patient/family education      OT Goals(Current goals can be found in the care plan section)   Acute Rehab OT Goals Patient Stated Goal: to go home OT Goal Formulation: With patient Time For Goal Achievement: 03/15/24 Potential to Achieve Goals: Good ADL Goals Pt Will Perform Grooming: with modified independence;standing Pt Will Perform Lower Body Dressing: with modified independence;sit to/from stand Pt Will Transfer to Toilet: with modified independence;ambulating;regular height toilet   OT Frequency:  Min 3X/week    Co-evaluation PT/OT/SLP Co-Evaluation/Treatment: Yes Reason for Co-Treatment: For patient/therapist safety;To address functional/ADL transfers PT goals addressed during session: Mobility/safety with mobility;Balance OT goals addressed during session: ADL's and self-care      AM-PAC OT "6 Clicks" Daily Activity     Outcome Measure Help from another person eating meals?: None Help from another person taking care of personal grooming?: A Little Help from another person toileting, which includes using toliet, bedpan, or urinal?: A Lot Help from another person bathing (including washing,  rinsing, drying)?: A Lot Help from another person to put on and taking off regular upper body clothing?: A Little Help from another person to put on and taking off regular lower body clothing?: A Lot 6 Click Score: 16   End of Session Nurse Communication: Mobility status  Activity Tolerance: Patient tolerated treatment well Patient left: in bed;with call bell/phone within reach;with bed alarm set  OT Visit Diagnosis: Other abnormalities of gait and mobility (R26.89);History of falling (Z91.81)                Time: 2595-6387 OT Time Calculation (min): 13 min Charges:  OT General Charges $OT Visit: 1 Visit OT Evaluation $OT Eval Low Complexity: 1 Low  Kathie Dike, M.S. OTR/L  03/01/24, 2:48 PM  ascom [phone]

## 2024-03-01 NOTE — Assessment & Plan Note (Addendum)
 Plan is for rehab and [facility].

## 2024-03-01 NOTE — ED Provider Notes (Signed)
 [facility] Provider Note    Event Date/Time   First MD Initiated Contact with Patient 03/01/24 0110     (approximate)   History   Fall and Weakness   HPI  Sean Hayden is a 78 y.o. male brought to the ED via EMS from home status post fall; patient reports his "legs gave out".  Patient was too weak to get back up on his own and his wife could not lift him up.  Urine test reports possible UTI due to cloudy urine seen at home.  Patient unsure of his struck his head during the fall.  Denies LOC.  History of atrial fibrillation on anticoagulants and mouth cancer with partial removal of his tongue.  Denies fever/chills, chest pain, shortness of breath, abdominal pain, nausea, vomiting or dizziness.     Past Medical History   Past Medical History:  Diagnosis Date   Aortic stenosis    a.) TTE 02/22/2016: moderate - severe AS (MPG 17); b.) s/p TAVR 09/09/2022: 26 mm Edwards Sapien 3 Ultra Resilia   Asthma    B12 deficiency    Carotid stenosis, bilateral    a.) carotid doppler 03/10/2012: 75-95% RICA, < 50 LICA; b.) s/p RIGHT CEA 04/17/2012 with CorMatrix patch angioplasty   Cerebral infarction [facility]) 01/03/2023   Mar 28, 2016 Entered By: Layla Barter GRACE Comment: March 2017. treated at [facility].Mar 28, 2016 Entered By: Layla Barter GRACE Comment: atrial fibrillation on holter   Coronary artery disease    Gout    Heart murmur    Hemorrhoids    HFrEF (heart failure with reduced ejection fraction) (HCC)    a.) TTE 02/22/2016: EF 60-65%, mild LV dil, mod RA dil, mod-sev AS, G1DD; b.) TTE 09/10/2022: EF 40%, glob HK, sep AK, mild BAE, mild TR, mod MR, pros AoV (MPG 3)   History of kidney stones    Hyperlipidemia    Hyperplastic colon polyp    Hypertension    Insomnia    Ischemic cerebrovascular accident (CVA) (HCC) 02/21/2016   a.) presented with LEFT sided weakness; b.) MRI brain 02/21/2016: acute infarct in the right medial parietal lobe  measuring 5 mm   Long term current use of anticoagulant    a.) rivaroxaban   Neuropathy    Nonischemic congestive cardiomyopathy (HCC)    a.) TTE 02/22/2016: EF 60-65%; b.) TTE 09/10/2022: EF 40%   Nonrheumatic aortic (valve) stenosis 08/23/2022   S/p TAVR Edwards biosprosthetic valve   Nose colonized with MRSA 02/21/2016   a.) surgical PCR (+) 02/21/2016 prior to ILR implant   NSVT (nonsustained ventricular tachycardia) (HCC)    a) holter 09/10/2022: longest episode 23 beats, and fastest episode 164 bpm   PAF (paroxysmal atrial fibrillation) (HCC)    a.) CHA2DS2VASc = 8 (age x2, HFrEF, HTN, CVAx2, vascular disease history, T2DM);  b.) s/p ILR implant 02/23/2016; c.) rate/rhythm maintained on oral metoprolol tartrate; chronically anticoagulated with rivaroxaban   Pneumonia    S/P TAVR (transcatheter aortic valve replacement) 09/09/2022   a.) 26 mm Edwards Sapien 3 Ultra Resilia   Seizures (HCC)    Skin cancer of face    T2DM (type 2 diabetes mellitus) (HCC)    Tongue neoplasm      Active Problem List   Patient Active Problem List   Diagnosis Date Noted   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 03/25/2023   Arthritis of knee 01/03/2023   Chronic diastolic heart failure (HCC) 01/03/2023   Chronic otitis externa  01/03/2023   Herpes labialis 01/03/2023   Neoplasm of uncertain behavior of skin 01/03/2023   Type 2 diabetes mellitus with hyperglycemia (HCC) 01/03/2023   Unspecified abnormalities of gait and mobility 01/03/2023   S/p TAVR (transcatheter aortic valve replacement), bioprosthetic 09/09/2022   HFrEF (heart failure with reduced ejection fraction) (HCC) 08/25/2022   Long term (current) use of anticoagulants 08/23/2022   Nonischemic congestive cardiomyopathy (HCC) 08/23/2022   PAF (paroxysmal atrial fibrillation) (HCC) 08/23/2022   Elevated TSH 01/26/2022   Cerebrovascular disease 12/29/2017   Partial seizure (HCC) 02/10/2017   History of MRSA infection  01/31/2017   Situational stress 04/16/2016   Paroxysmal atrial fibrillation (HCC) 03/26/2016   Vitamin D deficiency 02/28/2016   History of CVA (cerebrovascular accident) without residual deficits 02/22/2016   Diabetes mellitus with neuropathy (HCC) 02/21/2016   Hyperlipidemia 02/21/2016   Primary hypertension 02/21/2016   History of right-sided carotid endarterectomy 02/21/2016   Allergic rhinitis 02/21/2016   B12 deficiency 12/14/2015   Arthritis 05/25/2015   Diverticulosis of colon 05/25/2015   Edema 05/25/2015   Psoriasis 05/25/2015   RAD (reactive airway disease) 05/25/2015   OSA (obstructive sleep apnea) 05/25/2015   Carotid artery disease (HCC) 03/10/2012   Morbid obesity (HCC) 01/18/2008   Insomnia 12/09/2006   Gouty arthritis 02/07/1999     Past Surgical History   Past Surgical History:  Procedure Laterality Date   Arm fracture Left    CAROTID ARTERY ANGIOPLASTY Right 04/17/2012   Procedure: CAROTID ARTERY ANGIOPLASTY; Location: [facility]; Surgeon: Levora Dredge, MD   CAROTID ENDARTERECTOMY Right 04/17/2012   Procedure: CAROTID ENDARTARECTOMY; Location: [facility]; Surgeon: Levora Dredge, MD   CHOLECYSTECTOMY, LAPAROSCOPIC  2005   COLONOSCOPY     CYST EXCISION     "between legs"   EP IMPLANTABLE DEVICE N/A 02/23/2016   Procedure: Loop Recorder Insertion;  Surgeon: Hillis Range, MD;  Location: [facility] INVASIVE CV LAB;  Service: Cardiovascular;  Laterality: N/A;   HEMORRHOID SURGERY  2009   INCISION AND DRAINAGE WOUND WITH NERVE AND ARTERY REPAIR Left 10/04/2017   Procedure: LEFT HAND WOUND EXPLORATION repair as necessary;  Surgeon: Bradly Bienenstock, MD;  Location: [facility] OR;  Service: Orthopedics;  Laterality: Left;   MOHS SURGERY Right 12/24/2022   - Katrine Coho, MD   TEE WITHOUT CARDIOVERSION N/A 02/23/2016   Procedure: TRANSESOPHAGEAL ECHOCARDIOGRAM (TEE);  Surgeon: Chrystie Nose, MD;  Location: [facility] ENDOSCOPY;  Service: Cardiovascular;  Laterality: N/A;  ALSO GETTING A LOOP    TRANSCATHETER AORTIC VALVE REPLACEMENT, TRANSFEMORAL  09/09/2022   Procedure: TRANSCATHETER AORTIC VALVE REPLACEMENT, TRANSFEMORAL; Location: [facility]; Surgeon: Nance Pew, MD     Home Medications   Prior to Admission medications   Medication Sig Start Date End Date Taking? Authorizing Provider  acyclovir (ZOVIRAX) 400 MG tablet 1/2 tablet up to 5 times a day for cold sores Patient taking differently: as needed. 1/2 tablet up to 5 times a day for cold sores 05/20/22   Malva Limes, MD  albuterol (VENTOLIN HFA) 108 (90 Base) MCG/ACT inhaler Inhale 2 puffs into the lungs every 6 (six) hours as needed for wheezing or shortness of breath.    [provider]  atorvastatin (LIPITOR) 20 MG tablet Take 1 tablet (20 mg total) by mouth every evening. Patient taking differently: Take 40 mg by mouth every evening. 05/12/19   Malva Limes, MD  benzocaine (ORAJEL) 10 % mucosal gel Use as directed 1 application in the mouth or throat as needed for mouth pain.    [provider]  Cholecalciferol (VITAMIN D PO) Take 2,000 Units by mouth daily.     [provider]  Docosanol 10 % CREA Apply topically up to 5 times per day as needed until heals 12/03/16   Margaretann Loveless, PA-C  empagliflozin (JARDIANCE) 25 MG TABS tablet Take 1 tablet (25 mg total) by mouth daily. 04/07/23   Malva Limes, MD  Flaxseed, Linseed, (FLAXSEED OIL) 1000 MG CAPS Take 1,000 mg by mouth 2 (two) times daily.    [provider]  fluconazole (DIFLUCAN) 100 MG tablet Take 1 tablet (100 mg total) by mouth daily. 06/03/23   Carmina Miller, MD  fluticasone (FLONASE) 50 MCG/ACT nasal spray Place 2 sprays into both nostrils daily. Patient taking differently: Place 2 sprays into both nostrils as needed. 12/06/16   Malva Limes, MD  furosemide (LASIX) 20 MG tablet TAKE ONE (1) TABLET BY MOUTH ONCE DAILY AS NEEDED FOR EDEMA OR WEIGHT GAIN > 2 POUNDS IN 24 HOURS 04/07/23   Malva Limes, MD   gabapentin (NEURONTIN) 100 MG capsule Take 1 capsule (100 mg total) by mouth 2 (two) times daily. 05/12/19   Malva Limes, MD  insulin detemir (LEVEMIR) 100 UNIT/ML injection Inject 0.6 mLs (60 Units total) into the skin 2 (two) times daily. 05/30/23   Malva Limes, MD  levETIRAcetam (KEPPRA) 750 MG tablet Take 1 tablet (750 mg total) by mouth 2 (two) times daily. 04/07/23   Malva Limes, MD  lisinopril (ZESTRIL) 2.5 MG tablet Take 5 mg by mouth daily. 09/11/22   [provider]  loratadine (CLARITIN) 10 MG tablet Take 10 mg by mouth daily. 03/24/12   [provider]  metFORMIN (GLUCOPHAGE) 850 MG tablet Take 1 tablet (850 mg total) by mouth 2 (two) times daily with a meal. 05/12/19   Malva Limes, MD  metoprolol tartrate (LOPRESSOR) 25 MG tablet Take 0.5 tablets (12.5 mg total) by mouth every 12 (twelve) hours. 04/07/23   Malva Limes, MD  Misc Natural Products (OSTEO BI-FLEX TRIPLE STRENGTH PO) Take 1 tablet by mouth 2 (two) times daily.     [provider]  MISC NATURAL PRODUCTS PO Take 1 capsule by mouth in the morning and at bedtime. Super Beta Prostate    [provider]  Omega-3 Fatty Acids (FISH OIL) 1000 MG CPDR Take 1,200 mg by mouth 2 (two) times daily.     [provider]  PARoxetine (PAXIL) 40 MG tablet Take 1 tablet (40 mg total) by mouth daily. 04/07/23   Malva Limes, MD  rivaroxaban (XARELTO) 20 MG TABS tablet TAKE ONE TABLET BY MOUTH DAILY WITH SUPPER 12/04/22   Malva Limes, MD  sucralfate (CARAFATE) 1 g tablet Take 0.5 tablets (0.5 g total) by mouth 4 (four) times daily -  with meals and at bedtime. Dissolve half a tablet in 4 tablespoons warm water swish and swallow 04/02/23   Chrystal, Sherrine Maples, MD  sucralfate (CARAFATE) 1 g tablet Take 1 tablet (1 g total) by mouth 4 (four) times daily -  with meals and at bedtime. 10/02/23   Carmina Miller, MD     Allergies  Pioglitazone   Family History   Family History   Problem Relation Age of Onset   Hodgkin's lymphoma Father    Diabetes Mother      Physical Exam  Triage Vital Signs: ED Triage Vitals  Encounter Vitals Group     BP 03/01/24 0106 133/77     Systolic  BP Percentile --      Diastolic BP Percentile --      Pulse Rate 03/01/24 0106 (!) 118     Resp 03/01/24 0106 18     Temp 03/01/24 0106 97.6 F (36.4 C)     Temp src --      SpO2 03/01/24 0106 94 %     Weight 03/01/24 0107 221 lb 11.2 oz (100.6 kg)     Height 03/01/24 0107 5\' 7"  (1.702 m)     Head Circumference --      Peak Flow --      Pain Score --      Pain Loc --      Pain Education --      Exclude from Growth Chart --     Updated Vital Signs: BP 133/77 (BP Location: Right Arm)   Pulse (!) 118   Temp 97.6 F (36.4 C)   Resp 18   Ht 5\' 7"  (1.702 m)   Wt 100.6 kg   SpO2 97%   BMI 34.72 kg/m    General: Awake, mild distress.  CV:  Tachycardic, irregularly irregular rhythm.  Good peripheral perfusion.  Resp:  Normal effort.  CTAB. Abd:  Nontender.  No distention.  Other:  Alert and oriented to person and place.  CN II-XII grossly intact.  Mild dysarthria noted secondary to prior tongue surgery.  5/5 motor strength and sensation all extremities.   ED Results / Procedures / Treatments  Labs (all labs ordered are listed, but only abnormal results are displayed) Labs Reviewed  COMPREHENSIVE METABOLIC PANEL - Abnormal; Notable for the following components:      Result Value   Glucose, Bld 164 (*)    All other components within normal limits  URINALYSIS, W/ REFLEX TO CULTURE (INFECTION SUSPECTED) - Abnormal; Notable for the following components:   Color, Urine YELLOW (*)    APPearance CLEAR (*)    Glucose, UA >=500 (*)    Ketones, ur 20 (*)    All other components within normal limits  MAGNESIUM - Abnormal; Notable for the following components:   Magnesium 1.4 (*)    All other components within normal limits  RESP PANEL BY RT-PCR (RSV, FLU A&B, COVID)  [id number]   CBC WITH DIFFERENTIAL/PLATELET  LACTIC ACID, PLASMA  PROTIME-INR  TSH  TROPONIN I (HIGH SENSITIVITY)  TROPONIN I (HIGH SENSITIVITY)     EKG  ED ECG REPORT I, Duc Crocket J, the attending physician, personally viewed and interpreted this ECG.   Date: 03/01/2024  EKG Time: 0118  Rate: 100  Rhythm: atrial fibrillation, rate 100  Axis: Normal  Intervals:nonspecific intraventricular conduction delay  ST&T Change: Nonspecific    RADIOLOGY I have independently visualized and interpreted patient's imaging studies as well as noted the radiology interpretation:  CT head: No ICH  Chest x-ray: Pulmonary venous congestion, trace bilateral pleural effusions  Official radiology report(s): DG Chest Port 1 View Result Date: 03/01/2024 CLINICAL DATA:  weakness EXAM: PORTABLE CHEST 1 VIEW COMPARISON:  Chest x-ray 01/29/2023 FINDINGS: The heart and mediastinal contours are unchanged. Slightly prominent hilar vasculature. No focal consolidation. No pulmonary edema. Bilateral trace pleural effusions. No pneumothorax. No acute osseous abnormality. IMPRESSION: 1. Pulmonary venous congestion. 2. Bilateral trace pleural effusions. Electronically Signed   By: Tish Frederickson M.D.   On: 03/01/2024 01:58   CT Head Wo Contrast Result Date: 03/01/2024 CLINICAL DATA:  Head trauma, minor (Age >= 65y) EXAM: CT HEAD WITHOUT CONTRAST TECHNIQUE: Contiguous axial images were obtained  from the base of the skull through the vertex without intravenous contrast. RADIATION DOSE REDUCTION: This exam was performed according to the departmental dose-optimization program which includes automated exposure control, adjustment of the mA and/or kV according to patient size and/or use of iterative reconstruction technique. COMPARISON:  CT head 01/29/2023 FINDINGS: Brain: Patchy and confluent areas of decreased attenuation are noted throughout the deep and periventricular white matter of the cerebral hemispheres bilaterally,  compatible with chronic microvascular ischemic disease. No evidence of large-territorial acute infarction. No parenchymal hemorrhage. No mass lesion. No extra-axial collection. No mass effect or midline shift. No hydrocephalus. Basilar cisterns are patent. Vascular: No hyperdense vessel. Skull: No acute fracture or focal lesion. Sinuses/Orbits: Paranasal sinuses and mastoid air cells are clear. The orbits are unremarkable. Other: None. IMPRESSION: No acute intracranial abnormality. Electronically Signed   By: Tish Frederickson M.D.   On: 03/01/2024 01:54     PROCEDURES:  Critical Care performed: Yes, see critical care procedure note(s)  CRITICAL CARE Performed by: Irean Hong   Total critical care time: 30 minutes  Critical care time was exclusive of separately billable procedures and treating other patients.  Critical care was necessary to treat or prevent imminent or life-threatening deterioration.  Critical care was time spent personally by me on the following activities: development of treatment plan with patient and/or surrogate as well as nursing, discussions with consultants, evaluation of patient's response to treatment, examination of patient, obtaining history from patient or surrogate, ordering and performing treatments and interventions, ordering and review of laboratory studies, ordering and review of radiographic studies, pulse oximetry and re-evaluation of patient's condition.   [name]1-3 Lead EKG Interpretation  Performed by: Irean Hong, MD Authorized by: Irean Hong, MD     Interpretation: abnormal     ECG rate:  118   ECG rate assessment: tachycardic     Rhythm: atrial fibrillation     Ectopy: none     Conduction: normal   Comments:     Patient placed on cardiac monitor to evaluate for arrhythmias    MEDICATIONS ORDERED IN ED: Medications  magnesium sulfate IVPB 2 g 50 mL (2 g Intravenous New Bag/Given 03/01/24 0236)  diltiazem (CARDIZEM) injection 10 mg (has no  administration in time range)  sodium chloride 0.9 % bolus 500 mL (500 mLs Intravenous New Bag/Given 03/01/24 0206)  Tdap (BOOSTRIX) injection 0.5 mL (0.5 mLs Intramuscular Given 03/01/24 0207)     IMPRESSION / MDM / ASSESSMENT AND PLAN / ED COURSE  I reviewed the triage vital signs and the nursing notes.                             78 year old male presenting with fall, generalized weakness, possible UTI. Differential diagnosis includes, but is not limited to, alcohol, illicit or prescription medications, or other toxic ingestion; intracranial pathology such as stroke or intracerebral hemorrhage; fever or infectious causes including sepsis; hypoxemia and/or hypercarbia; uremia; trauma; endocrine related disorders such as diabetes, hypoglycemia, and thyroid-related diseases; hypertensive encephalopathy; etc. I personally reviewed patient's records and note PCP office visit from 01/27/2024 for laryngitis.  Patient's presentation is most consistent with acute complicated illness / injury requiring diagnostic workup.  The patient is on the cardiac monitor to evaluate for evidence of arrhythmia and/or significant heart rate changes.  Will obtain cardiac, respiratory panels, UA/culture.  Image head and chest.  Administer IV fluids and reassess.  Clinical Course as of 03/01/24 0302  Mon Mar 01, 2024  0300 HR 115-125, afib. Laboratory results demonstrate hypomagnesemia, ketonuria. Will administer IV Cardizem for better rate control, consult hospitalist service for evaluation and admission. [JS]    Clinical Course User Index [JS] Irean Hong, MD     FINAL CLINICAL IMPRESSION(S) / ED DIAGNOSES   Final diagnoses:  Fall, initial encounter  Generalized weakness  Skin tear of elbow without complication, initial encounter  Hypomagnesemia  Dehydration  Atrial fibrillation with rapid ventricular response (HCC)     Rx / DC Orders   ED Discharge Orders     None        Note:  This  document was prepared using Dragon voice recognition software and may include unintentional dictation errors.   Irean Hong, MD 03/01/24 [phone]

## 2024-03-01 NOTE — Assessment & Plan Note (Signed)
-   We will continue Paxil. 

## 2024-03-01 NOTE — Progress Notes (Addendum)
 SLP Cancellation Note  Patient Details Name: Sean Hayden MRN: [id number] DOB: [birth year]   Cancelled treatment:       Reason Eval/Treat Not Completed: Medical issues which prohibited therapy;Fatigue/lethargy limiting ability to participate.   SLP consult received and appreciated. Chart review completed. Pt received in room. Wife present. Pt sleeping soundly, wife stating "he's exhausted." HR in 110-120s, BP 133/113. Per wife, pt consumed naturally softer food PTA due to partial glossectomy. Wife requested chopped, well moistened solids. Diet modified per wife request. Will re-attempt clinical swallowing evaluation as appropriate.    Clyde Canterbury, M.S., CCC-SLP Speech-Language Pathologist [facility] [phone] Arnette Felts)     Woodroe Chen 03/01/2024, 8:54 AM

## 2024-03-01 NOTE — [place] FL2 (Signed)
 [place] MEDICAID FL2 LEVEL OF CARE FORM     IDENTIFICATION  Patient Name: Sean Hayden Birthdate: 1946-06-12 Sex: male Admission Date (Current Location): 03/01/2024  [facility] and [place] Number:  Chiropodist and Address:  [facility], [address] [place], [place] [address]      Provider Number: [id number]  Attending Physician Name and Address:  Marcelino Duster, MD  Relative Name and Phone Number:  Raydell Maners, Wife, [phone]    Current Level of Care: Hospital Recommended Level of Care: Skilled Nursing Facility Prior Approval Number:    Date Approved/Denied:   PASRR Number:    Discharge Plan: SNF    Current Diagnoses: Patient Active Problem List   Diagnosis Date Noted   Generalized weakness 03/01/2024   Paroxysmal atrial fibrillation with RVR (HCC) 03/01/2024   Hypomagnesemia 03/01/2024   Essential hypertension 03/01/2024   Depression 03/01/2024   Seizure disorder (HCC) 03/01/2024   Type 2 diabetes mellitus with peripheral neuropathy (HCC) 03/01/2024   Dyslipidemia 03/01/2024   Fall 03/01/2024   Atrial fibrillation with rapid ventricular response (HCC) 03/01/2024   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 03/25/2023   Arthritis of knee 01/03/2023   Chronic diastolic heart failure (HCC) 01/03/2023   Chronic otitis externa 01/03/2023   Herpes labialis 01/03/2023   Neoplasm of uncertain behavior of skin 01/03/2023   Type 2 diabetes mellitus with hyperglycemia (HCC) 01/03/2023   Unspecified abnormalities of gait and mobility 01/03/2023   S/p TAVR (transcatheter aortic valve replacement), bioprosthetic 09/09/2022   HFrEF (heart failure with reduced ejection fraction) (HCC) 08/25/2022   Long term (current) use of anticoagulants 08/23/2022   Nonischemic congestive cardiomyopathy (HCC) 08/23/2022   PAF (paroxysmal atrial fibrillation) (HCC) 08/23/2022   Elevated TSH 01/26/2022   Cerebrovascular disease  12/29/2017   Partial seizure (HCC) 02/10/2017   History of MRSA infection 01/31/2017   Situational stress 04/16/2016   Paroxysmal atrial fibrillation (HCC) 03/26/2016   Vitamin D deficiency 02/28/2016   History of CVA (cerebrovascular accident) without residual deficits 02/22/2016   Diabetes mellitus with neuropathy (HCC) 02/21/2016   Hyperlipidemia 02/21/2016   Primary hypertension 02/21/2016   History of right-sided carotid endarterectomy 02/21/2016   Allergic rhinitis 02/21/2016   B12 deficiency 12/14/2015   Arthritis 05/25/2015   Diverticulosis of colon 05/25/2015   Edema 05/25/2015   Psoriasis 05/25/2015   RAD (reactive airway disease) 05/25/2015   OSA (obstructive sleep apnea) 05/25/2015   Carotid artery disease (HCC) 03/10/2012   Morbid obesity (HCC) 01/18/2008   Insomnia 12/09/2006   Gouty arthritis 02/07/1999    Orientation RESPIRATION BLADDER Height & Weight     Self, Place  Normal Continent Weight: 221 lb 11.2 oz (100.6 kg) Height:  5\' 7"  (170.2 cm)  BEHAVIORAL SYMPTOMS/MOOD NEUROLOGICAL BOWEL NUTRITION STATUS      Continent Diet (Diet Carb Modified Fluid consistency: Thin)  AMBULATORY STATUS COMMUNICATION OF NEEDS Skin   Limited Assist Verbally Normal                       Personal Care Assistance Level of Assistance              Functional Limitations Info  Sight, Hearing Sight Info: Impaired (wear eye glasses) Hearing Info: Impaired      SPECIAL CARE FACTORS FREQUENCY  PT (By licensed PT), OT (By licensed OT)     PT Frequency: 5x's per week OT Frequency: 5x's per week  Contractures Contractures Info: Not present    Additional Factors Info  Code Status, Allergies Code Status Info: full Allergies Info: Pioglitazone           Current Medications (03/01/2024):  This is the current hospital active medication list Current Facility-Administered Medications  Medication Dose Route Frequency Provider Last Rate Last Admin    0.9 %  sodium chloride infusion   Intravenous Continuous Mansy, Jan A, MD 100 mL/hr at 03/01/24 1614 Rate Verify at 03/01/24 1614   acetaminophen (TYLENOL) tablet 650 mg  650 mg Oral Q6H PRN Mansy, Jan A, MD       Or   acetaminophen (TYLENOL) suppository 650 mg  650 mg Rectal Q6H PRN Mansy, Jan A, MD       albuterol (PROVENTIL) (2.5 MG/3ML) 0.083% nebulizer solution 2.5 mg  2.5 mg Nebulization Q6H PRN Otelia Sergeant, RPH       atorvastatin (LIPITOR) tablet 40 mg  40 mg Oral QPM Mansy, Jan A, MD   40 mg at 03/01/24 1752   benzocaine (ORAJEL) 10 % mucosal gel 1 Application  1 Application Mouth/Throat PRN Mansy, Jan A, MD       cholecalciferol (VITAMIN D3) 25 MCG (1000 UNIT) tablet 2,000 Units  2,000 Units Oral Daily Mansy, Jan A, MD   2,000 Units at 03/01/24 0949   empagliflozin (JARDIANCE) tablet 25 mg  25 mg Oral Daily Mansy, Jan A, MD   25 mg at 03/01/24 0948   fluticasone (FLONASE) 50 MCG/ACT nasal spray 2 spray  2 spray Each Nare PRN Mansy, Jan A, MD       gabapentin (NEURONTIN) capsule 100 mg  100 mg Oral BID Mansy, Jan A, MD   100 mg at 03/01/24 0948   insulin aspart (novoLOG) injection 0-15 Units  0-15 Units Subcutaneous TID WC Otelia Sergeant, RPH   2 Units at 03/01/24 1159   insulin aspart (novoLOG) injection 0-5 Units  0-5 Units Subcutaneous QHS Mansy, Jan A, MD       insulin glargine-yfgn [facility]) injection 60 Units  60 Units Subcutaneous BID Mansy, Jan A, MD   60 Units at 03/01/24 0950   levETIRAcetam (KEPPRA) tablet 750 mg  750 mg Oral BID Mansy, Jan A, MD   750 mg at 03/01/24 0950   lisinopril (ZESTRIL) tablet 5 mg  5 mg Oral Daily Mansy, Jan A, MD   5 mg at 03/01/24 0948   loratadine (CLARITIN) tablet 10 mg  10 mg Oral Daily PRN Mansy, Jan A, MD       magnesium hydroxide (MILK OF MAGNESIA) suspension 30 mL  30 mL Oral Daily PRN Mansy, Jan A, MD       metoprolol tartrate (LOPRESSOR) tablet 12.5 mg  12.5 mg Oral BID Mansy, Jan A, MD   12.5 mg at 03/01/24 1610   omega-3 acid ethyl  esters (LOVAZA) capsule 1 g  1 g Oral BID Mansy, Jan A, MD       ondansetron [facility]) tablet 4 mg  4 mg Oral Q6H PRN Mansy, Jan A, MD       Or   ondansetron [facility]) injection 4 mg  4 mg Intravenous Q6H PRN Mansy, Jan A, MD       PARoxetine (PAXIL) tablet 40 mg  40 mg Oral Daily Mansy, Jan A, MD   40 mg at 03/01/24 9604   rivaroxaban (XARELTO) tablet 20 mg  20 mg Oral Q supper Mansy, Jan A, MD   20 mg at 03/01/24 1651  traZODone (DESYREL) tablet 25 mg  25 mg Oral QHS PRN Mansy, Vernetta Honey, MD       Current Outpatient Medications  Medication Sig Dispense Refill   acetaminophen (TYLENOL) 325 MG tablet Take 650 mg by mouth 2 (two) times daily as needed for mild pain (pain score 1-3).     acyclovir (ZOVIRAX) 200 MG capsule Take 200 mg by mouth daily.     Cholecalciferol (VITAMIN D PO) Take 1,000 Units by mouth daily.     empagliflozin (JARDIANCE) 25 MG TABS tablet Take 1 tablet (25 mg total) by mouth daily. (Patient taking differently: Take 12.5 mg by mouth daily.) 90 tablet 2   finasteride (PROSCAR) 5 MG tablet Take 5 mg by mouth daily.     fluticasone (FLONASE) 50 MCG/ACT nasal spray Place 2 sprays into both nostrils daily. (Patient taking differently: Place 1 spray into both nostrils 2 (two) times daily as needed for allergies.) 16 g 1   gabapentin (NEURONTIN) 100 MG capsule Take 1 capsule (100 mg total) by mouth 2 (two) times daily. 180 capsule 1   insulin detemir (LEVEMIR) 100 UNIT/ML injection Inject 0.6 mLs (60 Units total) into the skin 2 (two) times daily. 10 mL 1   insulin glargine (LANTUS) 100 unit/mL SOPN Inject 10 Units into the skin at bedtime.     levETIRAcetam (KEPPRA) 750 MG tablet Take 1 tablet (750 mg total) by mouth 2 (two) times daily. 180 tablet 2   metFORMIN (GLUCOPHAGE) 850 MG tablet Take 1 tablet (850 mg total) by mouth 2 (two) times daily with a meal. 180 tablet 1   PARoxetine (PAXIL) 40 MG tablet Take 1 tablet (40 mg total) by mouth daily. 90 tablet 3   rivaroxaban (XARELTO)  20 MG TABS tablet TAKE ONE TABLET BY MOUTH DAILY WITH SUPPER 30 tablet 12   acyclovir (ZOVIRAX) 400 MG tablet 1/2 tablet up to 5 times a day for cold sores (Patient not taking: Reported on 03/01/2024) 30 tablet 5   albuterol (VENTOLIN HFA) 108 (90 Base) MCG/ACT inhaler Inhale 2 puffs into the lungs every 6 (six) hours as needed for wheezing or shortness of breath. (Patient not taking: Reported on 03/01/2024)     atorvastatin (LIPITOR) 20 MG tablet Take 1 tablet (20 mg total) by mouth every evening. (Patient not taking: Reported on 03/01/2024) 90 tablet 3   benzocaine (ORAJEL) 10 % mucosal gel Use as directed 1 application in the mouth or throat as needed for mouth pain. (Patient not taking: Reported on 03/01/2024)     Docosanol 10 % CREA Apply topically up to 5 times per day as needed until heals (Patient not taking: Reported on 03/01/2024) 2 g 3   Flaxseed, Linseed, (FLAXSEED OIL) 1000 MG CAPS Take 1,000 mg by mouth 2 (two) times daily. (Patient not taking: Reported on 03/01/2024)     fluconazole (DIFLUCAN) 100 MG tablet Take 1 tablet (100 mg total) by mouth daily. (Patient not taking: Reported on 03/01/2024) 10 tablet 0   furosemide (LASIX) 20 MG tablet TAKE ONE (1) TABLET BY MOUTH ONCE DAILY AS NEEDED FOR EDEMA OR WEIGHT GAIN > 2 POUNDS IN 24 HOURS (Patient not taking: Reported on 03/01/2024) 90 tablet 2   lisinopril (ZESTRIL) 2.5 MG tablet Take 5 mg by mouth daily. (Patient not taking: Reported on 03/01/2024)     loratadine (CLARITIN) 10 MG tablet Take 10 mg by mouth daily. (Patient not taking: Reported on 03/01/2024)     metoprolol tartrate (LOPRESSOR) 25 MG tablet Take 0.5  tablets (12.5 mg total) by mouth every 12 (twelve) hours. (Patient not taking: Reported on 03/01/2024) 90 tablet 2   Misc Natural Products (OSTEO BI-FLEX TRIPLE STRENGTH PO) Take 1 tablet by mouth 2 (two) times daily.  (Patient not taking: Reported on 03/01/2024)     MISC NATURAL PRODUCTS PO Take 1 capsule by mouth in the morning and at  bedtime. Super Beta Prostate (Patient not taking: Reported on 03/01/2024)     Omega-3 Fatty Acids (FISH OIL) 1000 MG CPDR Take 1,200 mg by mouth 2 (two) times daily.  (Patient not taking: Reported on 03/01/2024)     sucralfate (CARAFATE) 1 g tablet Take 0.5 tablets (0.5 g total) by mouth 4 (four) times daily -  with meals and at bedtime. Dissolve half a tablet in 4 tablespoons warm water swish and swallow (Patient not taking: Reported on 03/01/2024) 30 tablet 1   sucralfate (CARAFATE) 1 g tablet Take 1 tablet (1 g total) by mouth 4 (four) times daily -  with meals and at bedtime. (Patient not taking: Reported on 03/01/2024) 90 tablet 1     Discharge Medications: Please see discharge summary for a list of discharge medications.  Relevant Imaging Results:  Relevant Lab Results:   Additional Information ss#: [id number]  [place] [employer], LCSW

## 2024-03-01 NOTE — H&P (Signed)
 [place]   PATIENT NAME: Sean Hayden    MR#:  [id number]  DATE OF BIRTH:  1946/03/24  DATE OF ADMISSION:  03/01/2024  PRIMARY CARE PHYSICIAN: Malva Limes, MD   Patient is coming from: Home  REQUESTING/REFERRING PHYSICIAN: Chiquita Loth, MD CHIEF COMPLAINT:   Chief Complaint  Patient presents with   Fall   Weakness    HISTORY OF PRESENT ILLNESS:  Sean Hayden is a 78 y.o. male with medical history significant for aortic stenosis, carotid stenosis, gout, asthma, vitamin B12 deficiency, oral cancer status post partial glossectomy hypertension, dyslipidemia, HFrEF, seizure disorder, type 2 diabetes mellitus, s/p TAVR, who presented to the emergency room with acute onset of generalized weakness and feeling of his "legs give out".  He was too weak to get up on his own and his wife could not lift him up.  He believes he hit his Forehead but denied any presyncope or syncope.  No paresthesias or focal muscle weakness.  His wife noted that his urine. The patient's not sure if he hit his head during the fall.   No paresthesias or focal muscle weakness.  No fever or chills.  No nausea or vomiting or abdominal pain.  No dyspnea or cough or wheezing.  No chest pain or palpitations.  ED Course: When the patient came to the ER, heart rate was 118 and respiratory rate 18 with otherwise normal vital signs.  CMP revealed blood glucose of 164 and magnesium 1.4.  CBC was within normal.  High-sensitivity troponin I was 14 and later 13 and lactic acid 1.3.  Respiratory panel came back negative and UA was unremarkable.  EKG as reviewed by me :  EKG showed atrial fibrillation with a rate of 100 with PVCs and nonspecific intraventricular conduction delay. Imaging: Noncontrasted CT scan revealed no acute intracranial abnormalities and portable chest x-ray showed pulmonary venous congestion as well as bilateral trace pleural effusions.  The patient was given 500 mL IV normal saline bolus, 2 g IV  magnesium sulfate, 10 mg of IV diltiazem and had a Tdap booster.  The patient will be admitted to a progressive unit bed for further evaluation and management. PAST MEDICAL HISTORY:   Past Medical History:  Diagnosis Date   Aortic stenosis    a.) TTE 02/22/2016: moderate - severe AS (MPG 17); b.) s/p TAVR 09/09/2022: 26 mm Edwards Sapien 3 Ultra Resilia   Asthma    B12 deficiency    Carotid stenosis, bilateral    a.) carotid doppler 03/10/2012: 75-95% RICA, < 50 LICA; b.) s/p RIGHT CEA 04/17/2012 with CorMatrix patch angioplasty   Cerebral infarction [facility]) 01/03/2023   Mar 28, 2016 Entered By: Layla Barter GRACE Comment: March 2017. treated at [place].Mar 28, 2016 Entered By: Layla Barter GRACE Comment: atrial fibrillation on holter   Coronary artery disease    Gout    Heart murmur    Hemorrhoids    HFrEF (heart failure with reduced ejection fraction) (HCC)    a.) TTE 02/22/2016: EF 60-65%, mild LV dil, mod RA dil, mod-sev AS, G1DD; b.) TTE 09/10/2022: EF 40%, glob HK, sep AK, mild BAE, mild TR, mod MR, pros AoV (MPG 3)   History of kidney stones    Hyperlipidemia    Hyperplastic colon polyp    Hypertension    Insomnia    Ischemic cerebrovascular accident (CVA) (HCC) 02/21/2016   a.) presented with LEFT sided weakness; b.) MRI brain 02/21/2016: acute infarct in the right medial parietal  lobe measuring 5 mm   Long term current use of anticoagulant    a.) rivaroxaban   Neuropathy    Nonischemic congestive cardiomyopathy (HCC)    a.) TTE 02/22/2016: EF 60-65%; b.) TTE 09/10/2022: EF 40%   Nonrheumatic aortic (valve) stenosis 08/23/2022   S/p TAVR Edwards biosprosthetic valve   Nose colonized with MRSA 02/21/2016   a.) surgical PCR (+) 02/21/2016 prior to ILR implant   NSVT (nonsustained ventricular tachycardia) (HCC)    a) holter 09/10/2022: longest episode 23 beats, and fastest episode 164 bpm   PAF (paroxysmal atrial fibrillation) (HCC)    a.) CHA2DS2VASc = 8 (age x2,  HFrEF, HTN, CVAx2, vascular disease history, T2DM);  b.) s/p ILR implant 02/23/2016; c.) rate/rhythm maintained on oral metoprolol tartrate; chronically anticoagulated with rivaroxaban   Pneumonia    S/P TAVR (transcatheter aortic valve replacement) 09/09/2022   a.) 26 mm Edwards Sapien 3 Ultra Resilia   Seizures (HCC)    Skin cancer of face    T2DM (type 2 diabetes mellitus) (HCC)    Tongue neoplasm     PAST SURGICAL HISTORY:   Past Surgical History:  Procedure Laterality Date   Arm fracture Left    CAROTID ARTERY ANGIOPLASTY Right 04/17/2012   Procedure: CAROTID ARTERY ANGIOPLASTY; Location: [facility]; Surgeon: Levora Dredge, MD   CAROTID ENDARTERECTOMY Right 04/17/2012   Procedure: CAROTID ENDARTARECTOMY; Location: [facility]; Surgeon: Levora Dredge, MD   CHOLECYSTECTOMY, LAPAROSCOPIC  2005   COLONOSCOPY     CYST EXCISION     "between legs"   EP IMPLANTABLE DEVICE N/A 02/23/2016   Procedure: Loop Recorder Insertion;  Surgeon: Hillis Range, MD;  Location: [facility] INVASIVE CV LAB;  Service: Cardiovascular;  Laterality: N/A;   HEMORRHOID SURGERY  2009   INCISION AND DRAINAGE WOUND WITH NERVE AND ARTERY REPAIR Left 10/04/2017   Procedure: LEFT HAND WOUND EXPLORATION repair as necessary;  Surgeon: Bradly Bienenstock, MD;  Location: [facility] OR;  Service: Orthopedics;  Laterality: Left;   MOHS SURGERY Right 12/24/2022   - Katrine Coho, MD   TEE WITHOUT CARDIOVERSION N/A 02/23/2016   Procedure: TRANSESOPHAGEAL ECHOCARDIOGRAM (TEE);  Surgeon: Chrystie Nose, MD;  [facility] ENDOSCOPY;  Service: Cardiovascular;  Laterality: N/A;  ALSO GETTING A LOOP   TRANSCATHETER AORTIC VALVE REPLACEMENT, TRANSFEMORAL  09/09/2022   Procedure: TRANSCATHETER AORTIC VALVE REPLACEMENT, TRANSFEMORAL; Location: [facility]; Surgeon: Nance Pew, MD    SOCIAL HISTORY:   Social History   Tobacco Use   Smoking status: Former    Types: Cigars    Quit date: 04/08/2012    Years since quitting: 11.9   Smokeless tobacco: Never    Tobacco comments:    cigarettes and cigars  all quit 12 years ago  Substance Use Topics   Alcohol use: No    Alcohol/week: 0.0 standard drinks of alcohol    Comment: quit 2013    FAMILY HISTORY:   Family History  Problem Relation Age of Onset   Hodgkin's lymphoma Father    Diabetes Mother     DRUG ALLERGIES:   Allergies  Allergen Reactions   Pioglitazone     UNSPECIFIED REACTION     REVIEW OF SYSTEMS:   ROS As per history of present illness. All pertinent systems were reviewed above. Constitutional, HEENT, cardiovascular, respiratory, GI, GU, musculoskeletal, neuro, psychiatric, endocrine, integumentary and hematologic systems were reviewed and are otherwise negative/unremarkable except for positive findings mentioned above in the HPI.   MEDICATIONS AT HOME:   Prior to Admission medications   Medication Sig Start  Date End Date Taking? Authorizing Provider  acyclovir (ZOVIRAX) 400 MG tablet 1/2 tablet up to 5 times a day for cold sores Patient taking differently: as needed. 1/2 tablet up to 5 times a day for cold sores 05/20/22   Malva Limes, MD  albuterol (VENTOLIN HFA) 108 (90 Base) MCG/ACT inhaler Inhale 2 puffs into the lungs every 6 (six) hours as needed for wheezing or shortness of breath.    [provider]  atorvastatin (LIPITOR) 20 MG tablet Take 1 tablet (20 mg total) by mouth every evening. Patient taking differently: Take 40 mg by mouth every evening. 05/12/19   Malva Limes, MD  benzocaine (ORAJEL) 10 % mucosal gel Use as directed 1 application in the mouth or throat as needed for mouth pain.    [provider]  Cholecalciferol (VITAMIN D PO) Take 2,000 Units by mouth daily.     [provider]  Docosanol 10 % CREA Apply topically up to 5 times per day as needed until heals 12/03/16   Margaretann Loveless, PA-C  empagliflozin (JARDIANCE) 25 MG TABS tablet Take 1 tablet (25 mg total) by mouth daily. 04/07/23   Malva Limes, MD   Flaxseed, Linseed, (FLAXSEED OIL) 1000 MG CAPS Take 1,000 mg by mouth 2 (two) times daily.    [provider]  fluconazole (DIFLUCAN) 100 MG tablet Take 1 tablet (100 mg total) by mouth daily. 06/03/23   Carmina Miller, MD  fluticasone (FLONASE) 50 MCG/ACT nasal spray Place 2 sprays into both nostrils daily. Patient taking differently: Place 2 sprays into both nostrils as needed. 12/06/16   Malva Limes, MD  furosemide (LASIX) 20 MG tablet TAKE ONE (1) TABLET BY MOUTH ONCE DAILY AS NEEDED FOR EDEMA OR WEIGHT GAIN > 2 POUNDS IN 24 HOURS 04/07/23   Malva Limes, MD  gabapentin (NEURONTIN) 100 MG capsule Take 1 capsule (100 mg total) by mouth 2 (two) times daily. 05/12/19   Malva Limes, MD  insulin detemir (LEVEMIR) 100 UNIT/ML injection Inject 0.6 mLs (60 Units total) into the skin 2 (two) times daily. 05/30/23   Malva Limes, MD  levETIRAcetam (KEPPRA) 750 MG tablet Take 1 tablet (750 mg total) by mouth 2 (two) times daily. 04/07/23   Malva Limes, MD  lisinopril (ZESTRIL) 2.5 MG tablet Take 5 mg by mouth daily. 09/11/22   [provider]  loratadine (CLARITIN) 10 MG tablet Take 10 mg by mouth daily. 03/24/12   [provider]  metFORMIN (GLUCOPHAGE) 850 MG tablet Take 1 tablet (850 mg total) by mouth 2 (two) times daily with a meal. 05/12/19   Malva Limes, MD  metoprolol tartrate (LOPRESSOR) 25 MG tablet Take 0.5 tablets (12.5 mg total) by mouth every 12 (twelve) hours. 04/07/23   Malva Limes, MD  Misc Natural Products (OSTEO BI-FLEX TRIPLE STRENGTH PO) Take 1 tablet by mouth 2 (two) times daily.     [provider]  MISC NATURAL PRODUCTS PO Take 1 capsule by mouth in the morning and at bedtime. Super Beta Prostate    [provider]  Omega-3 Fatty Acids (FISH OIL) 1000 MG CPDR Take 1,200 mg by mouth 2 (two) times daily.     [provider]  PARoxetine (PAXIL) 40 MG tablet Take 1 tablet (40 mg total) by mouth daily. 04/07/23    Malva Limes, MD  rivaroxaban (XARELTO) 20 MG TABS tablet TAKE ONE TABLET BY MOUTH DAILY WITH SUPPER 12/04/22   Sherrie Mustache,  Demetrios Isaacs, MD  sucralfate (CARAFATE) 1 g tablet Take 0.5 tablets (0.5 g total) by mouth 4 (four) times daily -  with meals and at bedtime. Dissolve half a tablet in 4 tablespoons warm water swish and swallow 04/02/23   Chrystal, Sherrine Maples, MD  sucralfate (CARAFATE) 1 g tablet Take 1 tablet (1 g total) by mouth 4 (four) times daily -  with meals and at bedtime. 10/02/23   Carmina Miller, MD      VITAL SIGNS:  Blood pressure 111/82, pulse 89, temperature 97.6 F (36.4 C), resp. rate 18, height 5\' 7"  (1.702 m), weight 100.6 kg, SpO2 95%.  PHYSICAL EXAMINATION:  Physical Exam  GENERAL:  78 y.o.-year-old male patient lying in the bed with no acute distress.  EYES: Pupils equal, round, reactive to light and accommodation. No scleral icterus. Extraocular muscles intact.  HEENT: Head atraumatic, normocephalic. Oropharynx and nasopharynx clear.  NECK:  Supple, no jugular venous distention. No thyroid enlargement, no tenderness.  LUNGS: Normal breath sounds bilaterally, no wheezing, rales,rhonchi or crepitation. No use of accessory muscles of respiration.  CARDIOVASCULAR: Irregularly irregular   rhythm, S1, S2 normal. No murmurs, rubs, or gallops.  ABDOMEN: Soft, nondistended, nontender. Bowel sounds present. No organomegaly or mass.  EXTREMITIES: No pedal edema, cyanosis, or clubbing.  NEUROLOGIC: Cranial nerves II through XII are intact. Muscle strength 5/5 in all extremities. Sensation intact. Gait not checked.  PSYCHIATRIC: The patient is alert and oriented x 3.  Normal affect and good eye contact. SKIN: No obvious rash, lesion, or ulcer.   LABORATORY PANEL:   CBC Recent Labs  Lab 03/01/24 0125  WBC 7.7  HGB 16.2  HCT 48.6  PLT 192   ------------------------------------------------------------------------------------------------------------------  Chemistries   Recent Labs  Lab 03/01/24 0125  NA 137  K 4.7  CL 101  CO2 27  GLUCOSE 164*  BUN 11  CREATININE 0.72  CALCIUM 9.2  MG 1.4*  AST 22  ALT 18  ALKPHOS 75  BILITOT 0.9   ------------------------------------------------------------------------------------------------------------------  Cardiac Enzymes No results for input(s): "TROPONINI" in the last 168 hours. ------------------------------------------------------------------------------------------------------------------  RADIOLOGY:  DG Chest Port 1 View Result Date: 03/01/2024 CLINICAL DATA:  weakness EXAM: PORTABLE CHEST 1 VIEW COMPARISON:  Chest x-ray 01/29/2023 FINDINGS: The heart and mediastinal contours are unchanged. Slightly prominent hilar vasculature. No focal consolidation. No pulmonary edema. Bilateral trace pleural effusions. No pneumothorax. No acute osseous abnormality. IMPRESSION: 1. Pulmonary venous congestion. 2. Bilateral trace pleural effusions. Electronically Signed   By: Tish Frederickson M.D.   On: 03/01/2024 01:58   CT Head Wo Contrast Result Date: 03/01/2024 CLINICAL DATA:  Head trauma, minor (Age >= 65y) EXAM: CT HEAD WITHOUT CONTRAST TECHNIQUE: Contiguous axial images were obtained from the base of the skull through the vertex without intravenous contrast. RADIATION DOSE REDUCTION: This exam was performed according to the departmental dose-optimization program which includes automated exposure control, adjustment of the mA and/or kV according to patient size and/or use of iterative reconstruction technique. COMPARISON:  CT head 01/29/2023 FINDINGS: Brain: Patchy and confluent areas of decreased attenuation are noted throughout the deep and periventricular white matter of the cerebral hemispheres bilaterally, compatible with chronic microvascular ischemic disease. No evidence of large-territorial acute infarction. No parenchymal hemorrhage. No mass lesion. No extra-axial collection. No mass effect or midline shift.  No hydrocephalus. Basilar cisterns are patent. Vascular: No hyperdense vessel. Skull: No acute fracture or focal lesion. Sinuses/Orbits: Paranasal sinuses and mastoid air cells are clear. The orbits are unremarkable. Other: None. IMPRESSION: No  acute intracranial abnormality. Electronically Signed   By: Tish Frederickson M.D.   On: 03/01/2024 01:54      IMPRESSION AND PLAN:  Assessment and Plan: * Generalized weakness - Patient has subsequent fall without injuries. - He will be admitted to a progressive unit bed. - Physical therapy consult will be obtained. - This could be related to atrial fibrillation with RVR. - Management otherwise as below  Hypomagnesemia - We will replace magnesium level.  Paroxysmal atrial fibrillation with RVR (HCC) - We will manage his hypomagnesemia. - We will utilize as needed IV Cardizem he responded to. -We will continue Xarelto and beta-blocker therapy.  Dyslipidemia - We will continue statin therapy.  Type 2 diabetes mellitus with peripheral neuropathy (HCC) - The patient will be placed on supplement coverage with NovoLog. - We will hold off metformin. - We will resume Jardiance. - We will continue Neurontin.  Seizure disorder (HCC) - We will continue continue Keppra.  Depression - We will continue Paxil.   Essential hypertension - We will continue antihypertensive therapy.    DVT prophylaxis: Xarelto Advanced Care Planning:  Code Status: full code. Family Communication:  The plan of care was discussed in details with the patient (and family). I answered all questions. The patient agreed to proceed with the above mentioned plan. Further management will depend upon hospital course. Disposition Plan: Back to previous home environment Consults called: none. All the records are reviewed and case discussed with ED provider.  Status is: Inpatient   At the time of the admission, it appears that the appropriate admission status for this  patient is inpatient.  This is judged to be reasonable and necessary in order to provide the required intensity of service to ensure the patient's safety given the presenting symptoms, physical exam findings and initial radiographic and laboratory data in the context of comorbid conditions.  The patient requires inpatient status due to high intensity of service, high risk of further deterioration and high frequency of surveillance required.  I certify that at the time of admission, it is my clinical judgment that the patient will require inpatient hospital care extending more than 2 midnights.                            Dispo: The patient is from: Home              Anticipated d/c is to: Home              Patient currently is not medically stable to d/c.              Difficult to place patient: No  Hannah Beat M.D on 03/01/2024 at 6:13 AM  [facility]   From 7 PM-7 AM, contact night-coverage [email or web address]  CC: Primary care physician; Malva Limes, MD

## 2024-03-01 NOTE — Care Management CC44 (Signed)
 Condition Code 44 Documentation Completed  Patient Details  Name: MACGREGOR AESCHLIMAN MRN: [id number] Date of Birth: 11/05/1946   Condition Code 44 given:  Yes Patient signature on Condition Code 44 notice:  Yes Documentation of 2 MD's agreement:  Yes Code 44 added to claim:  Yes    [place] Uriel Horkey, LCSW 03/01/2024, 4:32 PM

## 2024-03-01 NOTE — Assessment & Plan Note (Addendum)
 The patient is normotensive on discharge on metoprolol 25 mg bid and lisinopril 2.5 mg daily

## 2024-03-01 NOTE — ED Triage Notes (Signed)
 Pt brought in by EMS from home after wife found pt down on the ground. Pt was too weak to get back up on his own. Pt states his "legs gave out" - pt was alert and oriented throughout fall and remains A/Ox4. NAD. MAE. Hx afib on blood thinners and mouth cancer with partial removal of tongue.

## 2024-03-01 NOTE — Assessment & Plan Note (Addendum)
 The patient will be discharged to home on: Lantus 42 units daily Jardiance 25 mg daily Metformin 850 mg bid

## 2024-03-01 NOTE — Assessment & Plan Note (Signed)
 -  The patient will be placed on supplement coverage with NovoLog. - We will hold off metformin.

## 2024-03-01 NOTE — Evaluation (Signed)
 Physical Therapy Evaluation Patient Details Name: Sean Hayden MRN: [id number] DOB: 10/11/1946 Today's Date: 03/01/2024  History of Present Illness  78 y/o male presented to ED on 03/01/24 following a fall. Admitted with Afib with RVR. PMH: aortic stenosis, carotid stenosis, gout, asthma, T2DM, oral cancer status post partial glossectomy, HTN, HFrEF, seizure disorder, , s/p TAVR  Clinical Impression  Patient admitted with the above. PTA, patient lives with wife and reports utilizing [facility] for mobility as well as endorsing 5-6 falls in past 6 months. Delayed responses but oriented x 4 during session. Received sitting at end of stretcher. Able to stand from end of stretcher with minA and ambulated 10' within room with Hhax1 and minA with +2 for line management. Unable to progress further 2/2 IV pole attached to ED stretcher. Patient will benefit from skilled PT services during acute stay to address listed deficits. Patient will benefit from ongoing therapy at discharge to maximize functional independence and safety. Anticipate patient likely to improve to return home with [facility] if mentation improves.         If plan is discharge home, recommend the following: A lot of help with walking and/or transfers;A lot of help with bathing/dressing/bathroom;Assistance with cooking/housework;Assist for transportation;Help with stairs or ramp for entrance   Can travel by private vehicle   Yes    Equipment Recommendations Rolling Kosha Jaquith (2 wheels)  Recommendations for Other Services       Functional Status Assessment Patient has had a recent decline in their functional status and demonstrates the ability to make significant improvements in function in a reasonable and predictable amount of time.     Precautions / Restrictions Precautions Precautions: Fall Recall of Precautions/Restrictions: Intact Restrictions Weight Bearing Restrictions Per Provider Order: No      Mobility  Bed Mobility Overal bed  mobility: Needs Assistance Bed Mobility: Sit to Supine       Sit to supine: Contact guard assist   General bed mobility comments: seated at end of stretcher at beginning of session    Transfers Overall transfer level: Needs assistance Equipment used: 1 person hand held assist Transfers: Sit to/from Stand Sit to Stand: Min assist, +2 safety/equipment                Ambulation/Gait Ambulation/Gait assistance: Min assist, +2 safety/equipment Gait Distance (Feet): 10 Feet Assistive device: 1 person hand held assist Gait Pattern/deviations: Step-to pattern, Decreased stride length Gait velocity: decreased     General Gait Details: assist for balance and +2 for line management  Stairs            Wheelchair Mobility     Tilt Bed    Modified Rankin (Stroke Patients Only)       Balance Overall balance assessment: Needs assistance, History of Falls Sitting-balance support: No upper extremity supported, Feet supported Sitting balance-Leahy Scale: Fair     Standing balance support: Single extremity supported, During functional activity Standing balance-Leahy Scale: Fair                               Pertinent Vitals/Pain Pain Assessment Pain Assessment: Faces Faces Pain Scale: No hurt Pain Intervention(s): Monitored during session    Home Living Family/patient expects to be discharged to:: Private residence Living Arrangements: Spouse/significant other Available Help at Discharge: Family;Available 24 hours/day Type of Home: House Home Access: Stairs to enter Entrance Stairs-Rails: Right;Left;Can reach both Entrance Stairs-Number of Steps: 8   Home Layout:  Two level Home Equipment: Cane - single point      Prior Function Prior Level of Function : Independent/Modified Independent;History of Falls (last six months)             Mobility Comments: report 5-6 falls in past 6 months, uses SPC for mobility       Extremity/Trunk  Assessment   Upper Extremity Assessment Upper Extremity Assessment: Defer to OT evaluation    Lower Extremity Assessment Lower Extremity Assessment: Generalized weakness       Communication   Communication Communication: No apparent difficulties Factors Affecting Communication: Difficulty expressing self    Cognition Arousal: Alert Behavior During Therapy: WFL for tasks assessed/performed   PT - Cognitive impairments: No family/caregiver present to determine baseline                         Following commands: Impaired Following commands impaired: Follows one step commands with increased time, Follows one step commands inconsistently     Cueing       General Comments      Exercises     Assessment/Plan    PT Assessment Patient needs continued PT services  PT Problem List Decreased strength;Decreased activity tolerance;Decreased balance;Decreased mobility;Decreased knowledge of use of DME;Decreased cognition;Decreased safety awareness;Decreased knowledge of precautions       PT Treatment Interventions DME instruction;Gait training;Functional mobility training;Stair training;Therapeutic activities;Therapeutic exercise;Balance training;Patient/family education;Neuromuscular re-education    PT Goals (Current goals can be found in the Care Plan section)  Acute Rehab PT Goals Patient Stated Goal: did not state PT Goal Formulation: With patient Time For Goal Achievement: 03/15/24 Potential to Achieve Goals: Fair    Frequency Min 3X/week     Co-evaluation PT/OT/SLP Co-Evaluation/Treatment: Yes Reason for Co-Treatment: For patient/therapist safety;To address functional/ADL transfers PT goals addressed during session: Mobility/safety with mobility;Balance OT goals addressed during session: ADL's and self-care       AM-PAC PT "6 Clicks" Mobility  Outcome Measure Help needed turning from your back to your side while in a flat bed without using bedrails?: A  Little Help needed moving from lying on your back to sitting on the side of a flat bed without using bedrails?: A Little Help needed moving to and from a bed to a chair (including a wheelchair)?: A Little Help needed standing up from a chair using your arms (e.g., wheelchair or bedside chair)?: A Lot Help needed to walk in hospital room?: A Lot Help needed climbing 3-5 steps with a railing? : A Lot 6 Click Score: 15    End of Session   Activity Tolerance: Patient tolerated treatment well Patient left: in bed;with call bell/phone within reach;with bed alarm set Nurse Communication: Mobility status PT Visit Diagnosis: Unsteadiness on feet (R26.81);Muscle weakness (generalized) (M62.81);Difficulty in walking, not elsewhere classified (R26.2);History of falling (Z91.81)    Time: 6045-4098 PT Time Calculation (min) (ACUTE ONLY): 14 min   Charges:   PT Evaluation $PT Eval Moderate Complexity: 1 Mod   PT General Charges $$ ACUTE PT VISIT: 1 Visit         Maylon Peppers, PT, DPT Physical Therapist - [facility]  [facility]   Eddy Termine A Mikita Lesmeister 03/01/2024, 2:48 PM

## 2024-03-01 NOTE — TOC Initial Note (Signed)
 Transition of Care [facility]) - Initial/Assessment Note    Patient Details  Name: Sean Hayden MRN: [id number] Date of Birth: 1946-09-18  [facility] CM/SW Contact:    Colin Broach, LCSW Phone Number: 03/01/2024, 6:55 PM  Clinical Narrative:                 CSW met with patient at bedside to complete assessment.  Patient's wife, Renea Ee, was at bedside.  CSW introduced self and reason for visit.  Patient was a little tired, but able to answer questions.  Pt's PCP is Dr. Sherrie Mustache and [facility] needs.  Pt lives with his wife.  He has the following DME at home, however, wife states that he doesn't typically use them (lift bed, cane, walker, shower chair and BSC).  Patient wife to transport at discharge. CSW discussed PT recommendation for SNF.  Patient is amenable.  Work-up started.  Pt had no preference.  Waiting on bed offers to present.  TOC to continue to follow.  Expected Discharge Plan: Skilled Nursing Facility Barriers to Discharge: Continued Medical Work up   Patient Goals and CMS Choice   CMS Medicare.gov Compare Post Acute Care list provided to:: Patient Choice offered to / list presented to : Patient      Expected Discharge Plan and Services     Post Acute Care Choice: Skilled Nursing Facility Living arrangements for the past 2 months: Single Family Home                                      Prior Living Arrangements/Services Living arrangements for the past 2 months: Single Family Home Lives with:: Spouse Patient language and need for interpreter reviewed:: Yes        Need for Family Participation in Patient Care: Yes (Comment) Care giver support system in place?: Yes (comment) Current home services: DME Criminal Activity/Legal Involvement Pertinent to Current Situation/Hospitalization: No - Comment as needed  Activities of Daily Living   ADL Screening (condition at time of admission) Independently performs ADLs?:  No Does the patient have a NEW difficulty with bathing/dressing/toileting/self-feeding that is expected to last >3 days?: Yes (Initiates electronic notice to provider for possible OT consult) Does the patient have a NEW difficulty with getting in/out of bed, walking, or climbing stairs that is expected to last >3 days?: Yes (Initiates electronic notice to provider for possible PT consult) Does the patient have a NEW difficulty with communication that is expected to last >3 days?: No Is the patient deaf or have difficulty hearing?: No Does the patient have difficulty seeing, even when wearing glasses/contacts?: No Does the patient have difficulty concentrating, remembering, or making decisions?: No  Permission Sought/Granted                  Emotional Assessment Appearance:: Appears stated age Attitude/Demeanor/Rapport: Other (comment) (a little sleepy, but able to answer questions.  Wife in room.) Affect (typically observed): Calm Orientation: : Oriented to Self, Oriented to Place, Oriented to  Time, Oriented to Situation Alcohol / Substance Use: Not Applicable Psych Involvement: No (comment)  Admission diagnosis:  Generalized weakness [R53.1] Patient Active Problem List   Diagnosis Date Noted   Generalized weakness 03/01/2024   Paroxysmal atrial fibrillation with RVR (HCC) 03/01/2024   Hypomagnesemia 03/01/2024   Essential hypertension 03/01/2024   Depression 03/01/2024   Seizure disorder (HCC) 03/01/2024  Type 2 diabetes mellitus with peripheral neuropathy (HCC) 03/01/2024   Dyslipidemia 03/01/2024   Fall 03/01/2024   Atrial fibrillation with rapid ventricular response (HCC) 03/01/2024   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 03/25/2023   Arthritis of knee 01/03/2023   Chronic diastolic heart failure (HCC) 01/03/2023   Chronic otitis externa 01/03/2023   Herpes labialis 01/03/2023   Neoplasm of uncertain behavior of skin 01/03/2023   Type 2 diabetes mellitus  with hyperglycemia (HCC) 01/03/2023   Unspecified abnormalities of gait and mobility 01/03/2023   S/p TAVR (transcatheter aortic valve replacement), bioprosthetic 09/09/2022   HFrEF (heart failure with reduced ejection fraction) (HCC) 08/25/2022   Long term (current) use of anticoagulants 08/23/2022   Nonischemic congestive cardiomyopathy (HCC) 08/23/2022   PAF (paroxysmal atrial fibrillation) (HCC) 08/23/2022   Elevated TSH 01/26/2022   Cerebrovascular disease 12/29/2017   Partial seizure (HCC) 02/10/2017   History of MRSA infection 01/31/2017   Situational stress 04/16/2016   Paroxysmal atrial fibrillation (HCC) 03/26/2016   Vitamin D deficiency 02/28/2016   History of CVA (cerebrovascular accident) without residual deficits 02/22/2016   Diabetes mellitus with neuropathy (HCC) 02/21/2016   Hyperlipidemia 02/21/2016   Primary hypertension 02/21/2016   History of right-sided carotid endarterectomy 02/21/2016   Allergic rhinitis 02/21/2016   B12 deficiency 12/14/2015   Arthritis 05/25/2015   Diverticulosis of colon 05/25/2015   Edema 05/25/2015   Psoriasis 05/25/2015   RAD (reactive airway disease) 05/25/2015   OSA (obstructive sleep apnea) 05/25/2015   Carotid artery disease (HCC) 03/10/2012   Morbid obesity (HCC) 01/18/2008   Insomnia 12/09/2006   Gouty arthritis 02/07/1999   PCP:  Malva Limes, MD Pharmacy:   [facility] - [place], [place] - [address] [place] [address] Phone: [phone] Fax: [phone]     Social Drivers of Health (SDOH) Social History: SDOH Screenings   Food Insecurity: No Food Insecurity (03/01/2024)  Housing: Low Risk  (03/01/2024)  Transportation Needs: No Transportation Needs (03/01/2024)  Utilities: Not At Risk (03/01/2024)  Alcohol Screen: Low Risk  (01/13/2024)  Depression (PHQ2-9): Low Risk  (01/13/2024)  Financial Resource Strain: Low Risk  (01/13/2024)  Physical Activity: Inactive (01/13/2024)  Social  Connections: Moderately Isolated (03/01/2024)  Stress: No Stress Concern Present (01/13/2024)  Tobacco Use: Medium Risk (03/01/2024)  Health Literacy: Adequate Health Literacy (01/13/2024)   SDOH Interventions:     Readmission Risk Interventions     No data to display

## 2024-03-01 NOTE — Care Management Obs Status (Signed)
 MEDICARE OBSERVATION STATUS NOTIFICATION   Patient Details  Name: Sean Hayden MRN: [id number] Date of Birth: 05-27-1946   Medicare Observation Status Notification Given:  Yes    [name], LCSW 03/01/2024, 4:31 PM

## 2024-03-01 NOTE — ED Notes (Signed)
 Pt had large BM, pericare and full bed change provided. Pt placed in brief with pads underneath.

## 2024-03-01 NOTE — Assessment & Plan Note (Addendum)
 The patient is on xarelto for stroke prophylaxis. His heart rate is controlled on metoprolol 25 mg bid.

## 2024-03-01 NOTE — Assessment & Plan Note (Addendum)
 Resolved

## 2024-03-01 NOTE — Progress Notes (Signed)
 Courtesy note: No billing  Patient is seen and examined today morning.  He is admitted for generalized weakness, falls.  Patient has history of paroxysmal atrial fibrillation, type 2 diabetes mellitus, seizures, depression.  During my exam, patient's heart rate around 110 on telemetry.  Denies any complaints.  Patient is weak, did not get out of bed.  Wife at bedside.  Patient with pain continued on telemetry, electrolytes closely monitored and replaced accordingly, continue IV Cardizem, beta-blocker and Xarelto therapy.  Will get PT/ OT evaluation for safe discharge plan.  Further management as per clinical course.  Discussed the care plan with patient and his wife at bedside.  They understand agree.

## 2024-03-01 NOTE — Progress Notes (Signed)
 SLP Cancellation Note  Patient Details Name: Sean Hayden MRN: [id number] DOB: 08-09-1946   Cancelled treatment:       Reason Eval/Treat Not Completed: Fatigue/lethargy limiting ability to participate (Despite verbal/tactile cueing, pt unable to rouse long enough for safe POs. Will continue efforts as appropriate.)   Woodroe Chen 03/01/2024, 12:49 PM

## 2024-03-01 NOTE — ED Notes (Signed)
 OT at bedside.

## 2024-03-01 NOTE — ED Notes (Signed)
 Pt pulled himself to end of the bed and used urinal independently. Pt able to stand at bedside and walk around side of bed with minimal assistance. Pt back in bed with side rails up x2.

## 2024-03-02 DIAGNOSIS — E1142 Type 2 diabetes mellitus with diabetic polyneuropathy: Secondary | ICD-10-CM

## 2024-03-02 DIAGNOSIS — R531 Weakness: Secondary | ICD-10-CM | POA: Diagnosis not present

## 2024-03-02 DIAGNOSIS — I480 Paroxysmal atrial fibrillation: Secondary | ICD-10-CM | POA: Diagnosis not present

## 2024-03-02 DIAGNOSIS — W19XXXA Unspecified fall, initial encounter: Secondary | ICD-10-CM | POA: Diagnosis not present

## 2024-03-02 DIAGNOSIS — I10 Essential (primary) hypertension: Secondary | ICD-10-CM

## 2024-03-02 LAB — GLUCOSE, CAPILLARY
Glucose-Capillary: 128 mg/dL — ABNORMAL HIGH (ref 70–99)
Glucose-Capillary: 154 mg/dL — ABNORMAL HIGH (ref 70–99)
Glucose-Capillary: 68 mg/dL — ABNORMAL LOW (ref 70–99)
Glucose-Capillary: 109 mg/dL — ABNORMAL HIGH (ref 70–99)

## 2024-03-02 LAB — CBG MONITORING, ED: Glucose-Capillary: 63 mg/dL — ABNORMAL LOW (ref 70–99)

## 2024-03-02 MED ORDER — INSULIN GLARGINE-YFGN 100 UNIT/ML ~~LOC~~ SOLN: 30.0000 [IU] | Freq: Every day | SUBCUTANEOUS | Status: DC

## 2024-03-02 MED ORDER — INSULIN ASPART 100 UNIT/ML IJ SOLN: 0.0000 [IU] | Freq: Every day | INTRAMUSCULAR | Status: DC

## 2024-03-02 MED ORDER — ADULT MULTIVITAMIN W/MINERALS CH
1.0000 | ORAL_TABLET | Freq: Every day | ORAL | Status: DC
  Administered 2024-03-03 – 2024-03-08 (×6): 1 via ORAL
  Filled 2024-03-02 (×6): qty 1

## 2024-03-02 MED ORDER — ENSURE ENLIVE PO LIQD
237.0000 mL | Freq: Three times a day (TID) | ORAL | Status: DC
  Administered 2024-03-02 – 2024-03-07 (×13): 237 mL via ORAL

## 2024-03-02 MED ORDER — INSULIN ASPART 100 UNIT/ML IJ SOLN
0.0000 [IU] | Freq: Three times a day (TID) | INTRAMUSCULAR | Status: DC
  Administered 2024-03-02 – 2024-03-03 (×2): 2 [IU] via SUBCUTANEOUS
  Administered 2024-03-03: 1 [IU] via SUBCUTANEOUS
  Administered 2024-03-03: 2 [IU] via SUBCUTANEOUS
  Administered 2024-03-05: 1 [IU] via SUBCUTANEOUS
  Administered 2024-03-06 (×2): 2 [IU] via SUBCUTANEOUS
  Administered 2024-03-06: 5 [IU] via SUBCUTANEOUS
  Administered 2024-03-07 (×2): 2 [IU] via SUBCUTANEOUS
  Administered 2024-03-07 – 2024-03-08 (×2): 1 [IU] via SUBCUTANEOUS
  Administered 2024-03-08: 2 [IU] via SUBCUTANEOUS
  Filled 2024-03-02 (×13): qty 1

## 2024-03-02 MED ORDER — INSULIN GLARGINE-YFGN 100 UNIT/ML ~~LOC~~ SOLN
30.0000 [IU] | Freq: Every day | SUBCUTANEOUS | Status: DC
  Administered 2024-03-02 – 2024-03-08 (×7): 30 [IU] via SUBCUTANEOUS
  Filled 2024-03-02 (×7): qty 0.3

## 2024-03-02 NOTE — Evaluation (Signed)
 Clinical/Bedside Swallow Evaluation Patient Details  Name: Sean Hayden MRN: [id number] Date of Birth: 1946/10/30  Today's Date: 03/02/2024 Time: SLP Start Time (ACUTE ONLY): 1105 SLP Stop Time (ACUTE ONLY): 1115 SLP Time Calculation (min) (ACUTE ONLY): 10 min  Past Medical History:  Past Medical History:  Diagnosis Date   Aortic stenosis    a.) TTE 02/22/2016: moderate - severe AS (MPG 17); b.) s/p TAVR 09/09/2022: 26 mm Edwards Sapien 3 Ultra Resilia   Asthma    B12 deficiency    Carotid stenosis, bilateral    a.) carotid doppler 03/10/2012: 75-95% RICA, < 50 LICA; b.) s/p RIGHT CEA 04/17/2012 with CorMatrix patch angioplasty   Cerebral infarction [facility]) 01/03/2023   Mar 28, 2016 Entered By: Layla Barter GRACE Comment: March 2017. treated at [place].Mar 28, 2016 Entered By: Layla Barter GRACE Comment: atrial fibrillation on holter   Coronary artery disease    Gout    Heart murmur    Hemorrhoids    HFrEF (heart failure with reduced ejection fraction) (HCC)    a.) TTE 02/22/2016: EF 60-65%, mild LV dil, mod RA dil, mod-sev AS, G1DD; b.) TTE 09/10/2022: EF 40%, glob HK, sep AK, mild BAE, mild TR, mod MR, pros AoV (MPG 3)   History of kidney stones    Hyperlipidemia    Hyperplastic colon polyp    Hypertension    Insomnia    Ischemic cerebrovascular accident (CVA) (HCC) 02/21/2016   a.) presented with LEFT sided weakness; b.) MRI brain 02/21/2016: acute infarct in the right medial parietal lobe measuring 5 mm   Long term current use of anticoagulant    a.) rivaroxaban   Neuropathy    Nonischemic congestive cardiomyopathy (HCC)    a.) TTE 02/22/2016: EF 60-65%; b.) TTE 09/10/2022: EF 40%   Nonrheumatic aortic (valve) stenosis 08/23/2022   S/p TAVR Edwards biosprosthetic valve   Nose colonized with MRSA 02/21/2016   a.) surgical PCR (+) 02/21/2016 prior to ILR implant   NSVT (nonsustained ventricular tachycardia) (HCC)    a) holter 09/10/2022: longest episode 23  beats, and fastest episode 164 bpm   PAF (paroxysmal atrial fibrillation) (HCC)    a.) CHA2DS2VASc = 8 (age x2, HFrEF, HTN, CVAx2, vascular disease history, T2DM);  b.) s/p ILR implant 02/23/2016; c.) rate/rhythm maintained on oral metoprolol tartrate; chronically anticoagulated with rivaroxaban   Pneumonia    S/P TAVR (transcatheter aortic valve replacement) 09/09/2022   a.) 26 mm Edwards Sapien 3 Ultra Resilia   Seizures (HCC)    Skin cancer of face    T2DM (type 2 diabetes mellitus) (HCC)    Tongue neoplasm    Past Surgical History:  Past Surgical History:  Procedure Laterality Date   Arm fracture Left    CAROTID ARTERY ANGIOPLASTY Right 04/17/2012   Procedure: CAROTID ARTERY ANGIOPLASTY; Location: [facility]; Surgeon: Levora Dredge, MD   CAROTID ENDARTERECTOMY Right 04/17/2012   Procedure: CAROTID ENDARTARECTOMY; Location: [facility]; Surgeon: Levora Dredge, MD   CHOLECYSTECTOMY, LAPAROSCOPIC  2005   COLONOSCOPY     CYST EXCISION     "between legs"   EP IMPLANTABLE DEVICE N/A 02/23/2016   Procedure: Loop Recorder Insertion;  Surgeon: Hillis Range, MD;  Location: [facility] INVASIVE CV LAB;  Service: Cardiovascular;  Laterality: N/A;   HEMORRHOID SURGERY  2009   INCISION AND DRAINAGE WOUND WITH NERVE AND ARTERY REPAIR Left 10/04/2017   Procedure: LEFT HAND WOUND EXPLORATION repair as necessary;  Surgeon: Bradly Bienenstock, MD;  Location: [facility] OR;  Service: Orthopedics;  Laterality: Left;  MOHS SURGERY Right 12/24/2022   - Katrine Coho, MD   TEE WITHOUT CARDIOVERSION N/A 02/23/2016   Procedure: TRANSESOPHAGEAL ECHOCARDIOGRAM (TEE);  Surgeon: Chrystie Nose, MD;  Location: [facility] ENDOSCOPY;  Service: Cardiovascular;  Laterality: N/A;  ALSO GETTING A LOOP   TRANSCATHETER AORTIC VALVE REPLACEMENT, TRANSFEMORAL  09/09/2022   Procedure: TRANSCATHETER AORTIC VALVE REPLACEMENT, TRANSFEMORAL; Location: [facility]; Surgeon: Nance Pew, MD   HPI:  78 y/o male presented to ED on 03/01/24 following a fall. Admitted  with Afib with RVR. PMH: aortic stenosis, carotid stenosis, gout, asthma, T2DM, oral cancer status post partial glossectomy, HTN, HFrEF, seizure disorder, , s/p TAVR. Neck CT, 02/09/24, "History of tongue cancer with marked progression from 04/04/2023 PET  CT. There is a nearly 6 cm mass involving the left floor of mouth  and mandible with submental and low left jugular chain adenopathy.  The left jugular chain node measures over 4 cm and shows signs of  extranodal extension.New 15 mm nodule in the left upper lobe,  suspect interval pulmonary metastasis." CXR, 03/01/24, "1. Pulmonary venous congestion.  2. Bilateral trace pleural effusions."    Assessment / Plan / Recommendation  Clinical Impression  Pt seen for clinical swallowing evaluation. Pt presents with s/sx mild oral dysphagia c/b prolonged mastication and mild oral residual with solids which is likely secondary to pt's partial glossectomy. Pharyngeal swallow was judged to be [facility] per clinical assessment. Pt endorsed consuming naturally softer food PTA. Current diet seems appropriate and likely most c/w diet PTA. Recommend continuation of Dysphagia 2 Diet with Thin Liquids and safe swallowing strategies/aspiration precautions as outlined below. SLP to sign off as pt has no acute ST needs. SLP Visit Diagnosis: Dysphagia, oral phase (R13.11)    Aspiration Risk  Mild aspiration risk;Risk for inadequate nutrition/hydration    Diet Recommendation Dysphagia 2 (Fine chop);Thin liquid    Liquid Administration via: Spoon;Cup;Straw Medication Administration:  (as tolerated) Supervision: Patient able to self feed Compensations: Minimize environmental distractions;Slow rate;Small sips/bites;Follow solids with liquid Postural Changes: Seated upright at 90 degrees;Remain upright for at least 30 minutes after po intake    Other  Recommendations Oral Care Recommendations: Oral care BID    Recommendations for follow up therapy are one component of a  multi-disciplinary discharge planning process, led by the attending physician.  Recommendations may be updated based on patient status, additional functional criteria and insurance authorization.  Follow up Recommendations No SLP follow up         Functional Status Assessment Patient has not had a recent decline in their functional status         Prognosis Prognosis for improved oropharyngeal function: Good Barriers to Reach Goals:  (comorbidities)      Swallow Study   General Date of Onset: 03/01/24 HPI: 78 y/o male presented to ED on 03/01/24 following a fall. Admitted with Afib with RVR. PMH: aortic stenosis, carotid stenosis, gout, asthma, T2DM, oral cancer status post partial glossectomy, HTN, HFrEF, seizure disorder, , s/p TAVR. Neck CT, 02/09/24, "History of tongue cancer with marked progression from 04/04/2023 PET  CT. There is a nearly 6 cm mass involving the left floor of mouth  and mandible with submental and low left jugular chain adenopathy.  The left jugular chain node measures over 4 cm and shows signs of  extranodal extension.New 15 mm nodule in the left upper lobe,  suspect interval pulmonary metastasis." CXR, 03/01/24, "1. Pulmonary venous congestion.  2. Bilateral trace pleural effusions." Type of Study: Bedside Swallow Evaluation Previous  Swallow Assessment: none Diet Prior to this Study: Dysphagia 2 (finely chopped);Thin liquids (Level 0) Temperature Spikes Noted: No Respiratory Status: Room air Behavior/Cognition: Alert;Cooperative;Pleasant mood Oral Cavity Assessment: Within Functional Limits Oral Care Completed by SLP: Yes Oral Cavity - Dentition: Missing dentition (upper partial) Vision: Functional for self-feeding Self-Feeding Abilities: Able to feed self Patient Positioning: Upright in bed Baseline Vocal Quality: Hoarse;Low vocal intensity Volitional Cough:  (reduced voicing) Volitional Swallow: Able to elicit    Oral/Motor/Sensory Function     Ice Chips Ice  chips: Not tested   Thin Liquid Thin Liquid: Within functional limits Presentation: Straw    Nectar Thick Nectar Thick Liquid: Not tested   Honey Thick Honey Thick Liquid: Not tested   Puree Puree: Within functional limits Presentation: Spoon   Solid     Solid: Impaired Oral Phase Impairments: Impaired mastication;Reduced lingual movement/coordination Oral Phase Functional Implications: Impaired mastication;Oral residue Pharyngeal Phase Impairments:  (WFL)     Clyde Canterbury, M.S., CCC-SLP Speech-Language Pathologist [facility] [phone] Arnette Felts)  Woodroe Chen 03/02/2024,12:06 PM

## 2024-03-02 NOTE — Plan of Care (Signed)

## 2024-03-02 NOTE — Inpatient Diabetes Management (Signed)
 Inpatient Diabetes Program Recommendations  AACE/ADA: New Consensus Statement on Inpatient Glycemic Control (2015)  Target Ranges:  Prepandial:   less than 140 mg/dL      Peak postprandial:   less than 180 mg/dL (1-2 hours)      Critically ill patients:  140 - 180 mg/dL   Lab Results  Component Value Date   GLUCAP 128 (H) 03/02/2024   HGBA1C 7.1 (H) 03/01/2024    Latest Reference Range & Units 03/01/24 07:51 03/01/24 11:51 03/01/24 16:14 03/01/24 16:56 03/01/24 22:16 03/02/24 08:03 03/02/24 11:42  Glucose-Capillary 70 - 99 mg/dL 161 (H) 096 (H) 80 88 95 63 (L) 128 (H)  (H): Data is abnormally high (L): Data is abnormally low  Diabetes history: DM2 Outpatient Diabetes medications: Levemir 60 units bid, Metformin 850 mg bid, Jardiance 25 mg daily Current orders for Inpatient glycemic control: Semglee 60 units bid, Jardiance 25 mg daily, Novolog 0-15 units tid, 0-5 units hs  Inpatient Diabetes Program Recommendations:   Please consider: -Decrease Semglee to 30 units daily -Decrease Novolog correction to 0-9 units tid, 0-5 units hs  Thank you, Darel Hong E. Samyiah Halvorsen, RN, MSN, CDCES  Diabetes Coordinator Inpatient Glycemic Control Team Team Pager [phone] (8am-5pm) 03/02/2024 12:35 PM

## 2024-03-02 NOTE — Progress Notes (Signed)
 Progress Note   Patient: Sean Hayden ZOX:[id number] DOB: 04-Jan-1946 DOA: 03/01/2024     1 DOS: the patient was seen and examined on 03/02/2024   Brief hospital course: JAVIAN NUDD is a 78 y.o. male with medical history significant for aortic stenosis, carotid stenosis, gout, asthma, vitamin B12 deficiency, oral cancer status post partial glossectomy hypertension, dyslipidemia, HFrEF, seizure disorder, type 2 diabetes mellitus, s/p TAVR, who presented to the emergency room with acute onset of generalized weakness and feeling of his "legs give out". His electrolytes abnormal, has Afib with RVR admitted to [facility] service.  Assessment and Plan: * Generalized weakness Fall without injuries. Continue to monitor vitals closely. PT/ OT advised placement. His HR better, blood sugars lower side yesterday Decreased insulin to 30 units daily.  Hypomagnesemia Replaced.   Paroxysmal atrial fibrillation with RVR (HCC) HR improved. Continue telemetry. As needed IV Cardizem. Continue Xarelto and beta-blocker therapy.  Dyslipidemia Continue statin therapy.  Type 2 diabetes mellitus with peripheral neuropathy (HCC) Hypoglycemia Decreased long acting insulin to 30units daily with sliding scale. Hold metformin. Continue Neurontin.  Seizure disorder (HCC) Continue Keppra.  Depression Continue Paxil.  Essential hypertension Continue antihypertensive therapy.  Obesity class 1 BMI 32.08 Diet, exercise and weight reduction advised.     Out of bed to chair. Incentive spirometry. Nursing supportive care. Fall, aspiration precautions. Diet:  Diet Orders (From admission, onward)     Start     Ordered   03/01/24 1659  DIET DYS 2 Room service appropriate? Yes; Fluid consistency: Thin  Diet effective now       Question Answer Comment  Room service appropriate? Yes   Fluid consistency: Thin      03/01/24 1658           DVT prophylaxis:  rivaroxaban (XARELTO) tablet 20 mg   Level of care: @LEVELOFCARE @   Code Status: Full Code  Subjective: Patient is seen and examined today morning. He is lying comfortably. Eating fair. Feels weak. Blood sugars lower side. Wife at bedside.  Physical Exam: Vitals:   03/01/24 2100 03/01/24 2200 03/02/24 0810 03/02/24 1200  BP: 104/75  113/62 107/71  Pulse: 97  86 94  Resp: 18  16   Temp:  97.7 F (36.5 C) 97.6 F (36.4 C) 98.1 F (36.7 C)  TempSrc:  Oral Oral Oral  SpO2: 95%  96% 95%  Weight:    92.9 kg  Height:        General - Elderly obese Caucasian male, no apparent distress HEENT - PERRLA, EOMI, atraumatic head, non tender sinuses. Lung - Clear, basal rales, no rhonchi, wheezes. Heart - S1, S2 heard, no murmurs, rubs, trace pedal edema. Abdomen - Soft, non tender, obese, bowel sounds good Neuro - Alert, awake and oriented x 3, non focal exam. Skin - Warm and dry.  Data Reviewed:      Latest Ref Rng & Units 03/01/2024    6:10 AM 03/01/2024    1:25 AM 01/29/2023    7:53 PM  CBC  WBC 4.0 - 10.5 K/uL 5.9  7.7  6.5   Hemoglobin 13.0 - 17.0 g/dL 81.1  91.4  78.2   Hematocrit 39.0 - 52.0 % 46.0  48.6  46.9   Platelets 150 - 400 K/uL 175  192  150       Latest Ref Rng & Units 03/01/2024    6:10 AM 03/01/2024    1:25 AM 03/17/2023    9:21 AM  BMP  Glucose 70 -  99 mg/dL 161  096  045   BUN 8 - 23 mg/dL 11  11  23    Creatinine 0.61 - 1.24 mg/dL 4.09  8.11  9.14   Sodium 135 - 145 mmol/L 139  137  138   Potassium 3.5 - 5.1 mmol/L 4.6  4.7  3.9   Chloride 98 - 111 mmol/L 105  101  101   CO2 22 - 32 mmol/L 28  27  30    Calcium 8.9 - 10.3 mg/dL 9.0  9.2  8.7    DG Chest Port 1 View Result Date: 03/01/2024 CLINICAL DATA:  weakness EXAM: PORTABLE CHEST 1 VIEW COMPARISON:  Chest x-ray 01/29/2023 FINDINGS: The heart and mediastinal contours are unchanged. Slightly prominent hilar vasculature. No focal consolidation. No pulmonary edema. Bilateral trace pleural effusions. No pneumothorax. No acute osseous  abnormality. IMPRESSION: 1. Pulmonary venous congestion. 2. Bilateral trace pleural effusions. Electronically Signed   By: Tish Frederickson M.D.   On: 03/01/2024 01:58   CT Head Wo Contrast Result Date: 03/01/2024 CLINICAL DATA:  Head trauma, minor (Age >= 65y) EXAM: CT HEAD WITHOUT CONTRAST TECHNIQUE: Contiguous axial images were obtained from the base of the skull through the vertex without intravenous contrast. RADIATION DOSE REDUCTION: This exam was performed according to the departmental dose-optimization program which includes automated exposure control, adjustment of the mA and/or kV according to patient size and/or use of iterative reconstruction technique. COMPARISON:  CT head 01/29/2023 FINDINGS: Brain: Patchy and confluent areas of decreased attenuation are noted throughout the deep and periventricular white matter of the cerebral hemispheres bilaterally, compatible with chronic microvascular ischemic disease. No evidence of large-territorial acute infarction. No parenchymal hemorrhage. No mass lesion. No extra-axial collection. No mass effect or midline shift. No hydrocephalus. Basilar cisterns are patent. Vascular: No hyperdense vessel. Skull: No acute fracture or focal lesion. Sinuses/Orbits: Paranasal sinuses and mastoid air cells are clear. The orbits are unremarkable. Other: None. IMPRESSION: No acute intracranial abnormality. Electronically Signed   By: Tish Frederickson M.D.   On: 03/01/2024 01:54    Family Communication: Discussed with patient, wife at bedside, they  understand and agree. All questions answered.  Disposition: Status is: Observation The patient remains OBS appropriate and will d/c before 2 midnights.  Planned Discharge Destination: Skilled nursing facility     Time spent: 39 minutes  Author: Marcelino Duster, MD 03/02/2024 2:13 PM Secure chat 7am to 7pm For on call review [email or web address].

## 2024-03-02 NOTE — Progress Notes (Signed)
 Physical Therapy Treatment Patient Details Name: Sean Hayden MRN: [id number] DOB: 1946-10-12 Today's Date: 03/02/2024   History of Present Illness 78 y/o male presented to ED on 03/01/24 following a fall. Admitted with Afib with RVR. PMH: aortic stenosis, carotid stenosis, gout, asthma, T2DM, oral cancer status post partial glossectomy, HTN, HFrEF, seizure disorder, , s/p TAVR    PT Comments  Patient semi reclined in bed on arrival and agreeable to PT treatment session. Impulsive during mobility requiring minA for balance and RW management. Frequent cueing for close proximity to RW with poor follow through. Cognition continues to be limiting factor placing him at higher risk for falls. Discharge plan remains appropriate.    If plan is discharge home, recommend the following: A little help with walking and/or transfers;A little help with bathing/dressing/bathroom;Assistance with cooking/housework;Direct supervision/assist for financial management;Direct supervision/assist for medications management;Assist for transportation;Help with stairs or ramp for entrance   Can travel by private vehicle     Yes  Equipment Recommendations  Rolling Malonie Tatum (2 wheels)    Recommendations for Other Services       Precautions / Restrictions Precautions Precautions: Fall Recall of Precautions/Restrictions: Intact Restrictions Weight Bearing Restrictions Per Provider Order: No     Mobility  Bed Mobility Overal bed mobility: Needs Assistance Bed Mobility: Supine to Sit, Sit to Supine     Supine to sit: Min assist Sit to supine: Contact guard assist        Transfers Overall transfer level: Needs assistance Equipment used: Rolling Edwin Baines (2 wheels) Transfers: Sit to/from Stand Sit to Stand: Contact guard assist           General transfer comment: use of momentum to get up into standing from low bed surface    Ambulation/Gait Ambulation/Gait assistance: Min assist Gait Distance  (Feet): 70 Feet Assistive device: Rolling Maralee Higuchi (2 wheels) Gait Pattern/deviations: Step-through pattern, Decreased stride length, Trunk flexed Gait velocity: decreased     General Gait Details: assist for RW management and balance due to impulsivity   Stairs             Wheelchair Mobility     Tilt Bed    Modified Rankin (Stroke Patients Only)       Balance Overall balance assessment: Needs assistance, History of Falls Sitting-balance support: No upper extremity supported, Feet supported Sitting balance-Leahy Scale: Fair     Standing balance support: Bilateral upper extremity supported, Reliant on assistive device for balance Standing balance-Leahy Scale: Fair                              Hotel manager: No apparent difficulties  Cognition Arousal: Alert Behavior During Therapy: Impulsive   PT - Cognitive impairments: No family/caregiver present to determine baseline                       PT - Cognition Comments: impulsive and follows commands with increased time Following commands: Impaired Following commands impaired: Follows one step commands with increased time, Follows one step commands inconsistently    Cueing    Exercises      General Comments        Pertinent Vitals/Pain Pain Assessment Pain Assessment: No/denies pain    Home Living                          Prior Function  PT Goals (current goals can now be found in the care plan section) Acute Rehab PT Goals Patient Stated Goal: to go home PT Goal Formulation: With patient Time For Goal Achievement: 03/15/24 Potential to Achieve Goals: Fair Progress towards PT goals: Progressing toward goals    Frequency    Min 3X/week      PT Plan      Co-evaluation              AM-PAC PT "6 Clicks" Mobility   Outcome Measure  Help needed turning from your back to your side while in a flat bed without using  bedrails?: A Little Help needed moving from lying on your back to sitting on the side of a flat bed without using bedrails?: A Little Help needed moving to and from a bed to a chair (including a wheelchair)?: A Little Help needed standing up from a chair using your arms (e.g., wheelchair or bedside chair)?: A Little Help needed to walk in hospital room?: A Little Help needed climbing 3-5 steps with a railing? : A Lot 6 Click Score: 17    End of Session   Activity Tolerance: Patient tolerated treatment well Patient left: in bed;with call bell/phone within reach;with bed alarm set Nurse Communication: Mobility status PT Visit Diagnosis: Unsteadiness on feet (R26.81);Muscle weakness (generalized) (M62.81);Difficulty in walking, not elsewhere classified (R26.2);History of falling (Z91.81)     Time: 1610-9604 PT Time Calculation (min) (ACUTE ONLY): 14 min  Charges:    $Therapeutic Activity: 8-22 mins PT General Charges $$ ACUTE PT VISIT: 1 Visit                     Maylon Peppers, PT, DPT Physical Therapist - [facility]  [facility]    Chaos Carlile A Yarenis Cerino 03/02/2024, 11:29 AM

## 2024-03-03 DIAGNOSIS — I480 Paroxysmal atrial fibrillation: Secondary | ICD-10-CM | POA: Diagnosis not present

## 2024-03-03 DIAGNOSIS — W19XXXA Unspecified fall, initial encounter: Secondary | ICD-10-CM | POA: Diagnosis not present

## 2024-03-03 DIAGNOSIS — R531 Weakness: Secondary | ICD-10-CM | POA: Diagnosis not present

## 2024-03-03 LAB — GLUCOSE, CAPILLARY
Glucose-Capillary: 125 mg/dL — ABNORMAL HIGH (ref 70–99)
Glucose-Capillary: 196 mg/dL — ABNORMAL HIGH (ref 70–99)
Glucose-Capillary: 72 mg/dL (ref 70–99)

## 2024-03-03 LAB — PHOSPHORUS: Phosphorus: 2.9 mg/dL (ref 2.5–4.6)

## 2024-03-03 LAB — VITAMIN D 25 HYDROXY (VIT D DEFICIENCY, FRACTURES): Vit D, 25-Hydroxy: 43.58 ng/mL (ref 30–100)

## 2024-03-03 LAB — VITAMIN B12: Vitamin B-12: 229 pg/mL (ref 180–914)

## 2024-03-03 MED ORDER — ALPRAZOLAM 0.25 MG PO TABS
0.2500 mg | ORAL_TABLET | Freq: Three times a day (TID) | ORAL | Status: DC | PRN
  Filled 2024-03-03: qty 1

## 2024-03-03 MED ORDER — DIPHENHYDRAMINE-ZINC ACETATE 2-0.1 % EX CREA
1.0000 | TOPICAL_CREAM | Freq: Two times a day (BID) | CUTANEOUS | Status: DC | PRN
  Filled 2024-03-03: qty 1

## 2024-03-03 MED ORDER — LORAZEPAM 2 MG/ML IJ SOLN
1.0000 mg | Freq: Once | INTRAMUSCULAR | Status: AC
  Administered 2024-03-03: 1 mg via INTRAVENOUS
  Filled 2024-03-03: qty 1

## 2024-03-03 MED ORDER — RIVAROXABAN 20 MG PO TABS
20.0000 mg | ORAL_TABLET | Freq: Every day | ORAL | Status: DC
  Administered 2024-03-04 – 2024-03-07 (×4): 20 mg via ORAL
  Filled 2024-03-03 (×3): qty 1

## 2024-03-03 NOTE — Progress Notes (Signed)
 Progress Note   Patient: Sean Hayden DOB: Apr 15, 1946 DOA: 03/01/2024     1 DOS: the patient was seen and examined on 03/03/2024   Brief hospital course: Sean Hayden is a 78 y.o. male with medical history significant for aortic stenosis, carotid stenosis, gout, asthma, vitamin B12 deficiency, oral cancer status post partial glossectomy hypertension, dyslipidemia, HFrEF, seizure disorder, type 2 diabetes mellitus, s/p TAVR, who presented to the emergency room with acute onset of generalized weakness and feeling of his "legs give out". His electrolytes abnormal, has Afib with RVR admitted to [facility] service.  Assessment and Plan: * Generalized weakness Fall without injuries. Continue to monitor vitals closely. PT/ OT advised placement. His HR better, blood sugars lower side yesterday Continue insulin 30 units daily. Monitor for hypoglycemia.  Hypomagnesemia Replaced.   Paroxysmal atrial fibrillation with RVR (HCC) HR improved. Continue telemetry. As needed IV Cardizem. Continue Xarelto and beta-blocker therapy.  Dyslipidemia Continue statin therapy.  Type 2 diabetes mellitus with peripheral neuropathy (HCC) Hypoglycemia Decreased long acting insulin to 30units daily with sliding scale. Hold metformin. Continue Neurontin. Hypoglycemia protocol.  Seizure disorder (HCC) Continue Keppra.  Depression Continue Paxil.  Essential hypertension Continue antihypertensive therapy.  Obesity class 1 BMI 31.92 Diet, exercise and weight reduction advised.    Tele sitter order placed for safety. Out of bed to chair. Incentive spirometry. Nursing supportive care. Fall, aspiration precautions. Diet:  Diet Orders (From admission, onward)     Start     Ordered   03/01/24 1659  DIET DYS 2 Room service appropriate? Yes; Fluid consistency: Thin  Diet effective now       Question Answer Comment  Room service appropriate? Yes   Fluid consistency: Thin       03/01/24 1658           DVT prophylaxis:  rivaroxaban (XARELTO) tablet 20 mg  Level of care: tele/ medical   Code Status: Full Code  Subjective: Patient is seen and examined today morning. He is sitting in chair. Eating fair. On and off confused per RN. Wife at bedside.  Physical Exam: Vitals:   03/03/24 0351 03/03/24 0603 03/03/24 0854 03/03/24 1207  BP: 106/76  105/80 112/70  Pulse: 80  68 93  Resp:   19 19  Temp: 97.9 F (36.6 C)  97.7 F (36.5 C) 98.1 F (36.7 C)  TempSrc: Oral  Oral   SpO2: 92%  95% 95%  Weight:  92.4 kg    Height:        General - Elderly obese Caucasian male, no apparent distress HEENT - PERRLA, EOMI, atraumatic head, non tender sinuses. Lung - Clear, basal rales, no rhonchi, wheezes. Heart - S1, S2 heard, no murmurs, rubs, trace pedal edema. Abdomen - Soft, non tender, obese, bowel sounds good Neuro - Alert, awake and oriented x 3, non focal exam. Skin - Warm and dry.  Data Reviewed:      Latest Ref Rng & Units 03/01/2024    6:10 AM 03/01/2024    1:25 AM 01/29/2023    7:53 PM  CBC  WBC 4.0 - 10.5 K/uL 5.9  7.7  6.5   Hemoglobin 13.0 - 17.0 g/dL 56.2  13.0  86.5   Hematocrit 39.0 - 52.0 % 46.0  48.6  46.9   Platelets 150 - 400 K/uL 175  192  150       Latest Ref Rng & Units 03/01/2024    6:10 AM 03/01/2024    1:25 AM 03/17/2023  9:21 AM  BMP  Glucose 70 - 99 mg/dL 161  096  045   BUN 8 - 23 mg/dL 11  11  23    Creatinine 0.61 - 1.24 mg/dL 4.09  8.11  9.14   Sodium 135 - 145 mmol/L 139  137  138   Potassium 3.5 - 5.1 mmol/L 4.6  4.7  3.9   Chloride 98 - 111 mmol/L 105  101  101   CO2 22 - 32 mmol/L 28  27  30    Calcium 8.9 - 10.3 mg/dL 9.0  9.2  8.7    No results found.   Family Communication: Discussed with patient, wife at bedside, they  understand and agree. All questions answered.  Disposition: Status is: Observation The patient remains OBS appropriate and will d/c before 2 midnights.  Planned Discharge Destination:  Skilled nursing facility     Time spent: 37 minutes  Author: Marcelino Duster, MD 03/03/2024 1:34 PM Secure chat 7am to 7pm For on call review [email or web address].

## 2024-03-03 NOTE — TOC Progression Note (Signed)
 Transition of Care [facility]) - Progression Note    Patient Details  Name: Sean Hayden MRN: [id number] Date of Birth: December 19, 1945  Transition of [facility]) CM/SW Contact  Truddie Hidden, RN Phone Number: 03/03/2024, 2:51 PM  Clinical Narrative:    Spoke with patient and wife at bedside regarding choice for bed offer. Patient would like [facility]. He and wife were advised [facility] Harvey would be required for SNF admission  3:04pm Vesta Mixer contacted regarding inability to access patient profile. Patient is known as Sean Hayden in [employer] auth started.  PASRR obtained [id number] A Facility updated regarding auth and PASRR   Expected Discharge Plan: Skilled Nursing Facility Barriers to Discharge: Continued Medical Work up  Expected Discharge Plan and Services     Post Acute Care Choice: Skilled Nursing Facility Living arrangements for the past 2 months: Single Family Home                                       Social Determinants of Health (SDOH) Interventions SDOH Screenings   Food Insecurity: No Food Insecurity (03/01/2024)  Housing: Low Risk  (03/01/2024)  Transportation Needs: No Transportation Needs (03/01/2024)  Utilities: Not At Risk (03/01/2024)  Alcohol Screen: Low Risk  (01/13/2024)  Depression (PHQ2-9): Low Risk  (01/13/2024)  Financial Resource Strain: Low Risk  (01/13/2024)  Physical Activity: Inactive (01/13/2024)  Social Connections: Moderately Isolated (03/01/2024)  Stress: No Stress Concern Present (01/13/2024)  Tobacco Use: Medium Risk (03/01/2024)  Health Literacy: Adequate Health Literacy (01/13/2024)    Readmission Risk Interventions     No data to display

## 2024-03-03 NOTE — Plan of Care (Signed)
  Problem: Education: Goal: Ability to describe self-care measures that may prevent or decrease complications (Diabetes Survival Skills Education) will improve Outcome: Progressing   Problem: Coping: Goal: Ability to adjust to condition or change in health will improve Outcome: Progressing   Problem: Skin Integrity: Goal: Risk for impaired skin integrity will decrease Outcome: Progressing   Problem: Pain Managment: Goal: General experience of comfort will improve and/or be controlled Outcome: Progressing   Problem: Safety: Goal: Ability to remain free from injury will improve Outcome: Progressing

## 2024-03-03 NOTE — Progress Notes (Signed)
 Initial Nutrition Assessment  DOCUMENTATION CODES:   Obesity unspecified  INTERVENTION:   -Continue with dysphagia 2 diet -MVI with minerals daily -Magic cup BID with meals, each supplement provides 290 kcal and 9 grams of protein   NUTRITION DIAGNOSIS:   Increased nutrient needs related to acute illness as evidenced by estimated needs.  GOAL:   Patient will meet greater than or equal to 90% of their needs  MONITOR:   PO intake, Supplement acceptance  REASON FOR ASSESSMENT:   Consult Assessment of nutrition requirement/status  ASSESSMENT:   78 y.o. male with medical history significant for aortic stenosis, carotid stenosis, gout, asthma, vitamin B12 deficiency, oral cancer status post partial glossectomy, hypertension, dyslipidemia, HFrEF, seizure disorder, type 2 diabetes mellitus, s/p TAVR, depression, CVA, diverticulitis and OSA who presented to the emergency room with acute onset of generalized weakness.  Pt admitted with generalized weakness and falls.   3/25- s/p BSE- dysphagia 2 diet with thin liquids  Reviewed I/O's: +135 ml x 24 hours and +2 L since admission  UOP: 225 ml x 24 hours  Spoke with pt, who was sitting up in recliner chair at time of visit. Pt was pleasant and in good spirits, reports feeling better today. He reports he has a good appetite and consumed most of his breakfast this morning. Noted documented meal completions 25%. Pt reports he follows a soft textured diet at home secondary to history of oral cancer and appreciates dysphagia 2 diet ordered here.   Reviewed wt hx; pt endorses wt loss due to history of oral cancer and cancer treatments. Noted pt's last radiation treatment was on 05/29/23. Pt shares that during this time period, he had difficulty swallowing secondary to radiation side effects, however, this improved with glossectomy. Reviewed wt hx; pt has experienced a 6.3% wt loss over the past month, which is significant for time frame. Pt  politely declined offer for supplements, stating he does not want to gain weight. RD discussed importance of adequate nutrition to help preserve lean body mass.   Per TOC notes, plan for SNF placement at discharge.   Medications reviewed and include vitamin D3, jardiance, neurontin, keppra, lovaza, and lovaza.   Lab Results  Component Value Date   HGBA1C 7.1 (H) 03/01/2024   PTA DM medications are 850 mg metformin BID and 60 units insulin detemir daily.   Labs reviewed: CBGS: 63-154 (inpatient orders for glycemic control are 0-5 units insulin aspart daily at bedtime, 0-9 units insulin aspart TID with meals and 30 units insulin glargine-yfgn daily). Vitamin D and vitamin B-12 WDL.   NUTRITION - FOCUSED PHYSICAL EXAM:  Flowsheet Row Most Recent Value  Orbital Region No depletion  Upper Arm Region No depletion  Thoracic and Lumbar Region No depletion  Buccal Region No depletion  Temple Region No depletion  Clavicle Bone Region No depletion  Clavicle and Acromion Bone Region No depletion  Scapular Bone Region No depletion  Dorsal Hand No depletion  Patellar Region No depletion  Anterior Thigh Region No depletion  Posterior Calf Region No depletion  Edema (RD Assessment) Mild  Hair Reviewed  Eyes Reviewed  Mouth Reviewed  Skin Reviewed  Nails Reviewed       Diet Order:   Diet Order             DIET DYS 2 Room service appropriate? Yes; Fluid consistency: Thin  Diet effective now  EDUCATION NEEDS:   Education needs have been addressed  Skin:  Skin Assessment: Skin Integrity Issues: Skin Integrity Issues:: Other (Comment) Other: skin tear to lt elbow  Last BM:  03/02/24  Height:   Ht Readings from Last 1 Encounters:  03/01/24 5\' 7"  (1.702 m)    Weight:   Wt Readings from Last 1 Encounters:  03/03/24 92.4 kg    Ideal Body Weight:  67.2 kg  BMI:  Body mass index is 31.92 kg/m.  Estimated Nutritional Needs:   Kcal:   1900-2200kcal/day  Protein:  95-110g/day  Fluid:  1.7-2.0L/day    Levada Schilling, RD, LDN, CDCES Registered Dietitian III Certified Diabetes Care and Education Specialist If unable to reach this RD, please use "RD Inpatient" group chat on secure chat between hours of 8am-4 pm daily

## 2024-03-03 NOTE — TOC Progression Note (Signed)
 Transition of Care [facility]) - Progression Note    Patient Details  Name: Sean Hayden MRN: [id number] Date of Birth: 24-Oct-1946  Transition [facility]) CM/SW Contact  Truddie Hidden, RN Phone Number: 03/03/2024, 3:14 PM  Clinical Narrative:    Spoke with patient and his spouse at the bedside to give bed offers for [facility], [facility] and [facility]. Patient and his wife would like time to consider offer and will make a decision by the end of the day.    Expected Discharge Plan: Skilled Nursing Facility Barriers to Discharge: Continued Medical Work up  Expected Discharge Plan and Services     Post Acute Care Choice: Skilled Nursing Facility Living arrangements for the past 2 months: Single Family Home                                       Social Determinants of Health (SDOH) Interventions SDOH Screenings   Food Insecurity: No Food Insecurity (03/01/2024)  Housing: Low Risk  (03/01/2024)  Transportation Needs: No Transportation Needs (03/01/2024)  Utilities: Not At Risk (03/01/2024)  Alcohol Screen: Low Risk  (01/13/2024)  Depression (PHQ2-9): Low Risk  (01/13/2024)  Financial Resource Strain: Low Risk  (01/13/2024)  Physical Activity: Inactive (01/13/2024)  Social Connections: Moderately Isolated (03/01/2024)  Stress: No Stress Concern Present (01/13/2024)  Tobacco Use: Medium Risk (03/01/2024)  Health Literacy: Adequate Health Literacy (01/13/2024)    Readmission Risk Interventions     No data to display

## 2024-03-03 NOTE — Progress Notes (Signed)
 Occupational Therapy Treatment Patient Details Name: Sean Hayden MRN: [id number] DOB: [birth year] Today's Date: 03/03/2024   History of present illness 78 y/o male presented to ED on 03/01/24 following a fall. Admitted with Afib with RVR. PMH: aortic stenosis, carotid stenosis, gout, asthma, T2DM, oral cancer status post partial glossectomy, HTN, HFrEF, seizure disorder, , s/p TAVR   OT comments  Upon entering the room, pt seated in recliner chair with lunch tray in front of him and yells, " I want to go home". OT offered to assist pt to bathroom for toileting needs. Pt is very impulsive with movement needing mod multimodal cues for safety awareness and proper use of RW to ambulate 30' into bathroom with min A. Pt stands to urinate with min A for standing balance. He then stands at sink to wash hands with increased time and cuing for problem solving and sequencing of task. Pt yelling that his phone was ringing (it was not) and he returns to recliner to answer it. Pt yelling his name to therapist and to "call someone to get my ass out of here". Pt remained seated in recliner chair with chair alarm activated and session ended in order to no further agitate pt. Call bell and all needed items within reach.       If plan is discharge home, recommend the following:  A little help with walking and/or transfers;A lot of help with bathing/dressing/bathroom;Help with stairs or ramp for entrance;Supervision due to cognitive status   Equipment Recommendations  Other (comment) (defer to next venue of care)       Precautions / Restrictions Precautions Precautions: Fall Recall of Precautions/Restrictions: Intact       Mobility Bed Mobility               General bed mobility comments: seated in recliner chair at beginning and end of session    Transfers Overall transfer level: Needs assistance Equipment used: Rolling walker (2 wheels) Transfers: Sit to/from Stand Sit to Stand: Contact  guard assist                 Balance Overall balance assessment: Needs assistance, History of Falls Sitting-balance support: No upper extremity supported, Feet supported Sitting balance-Leahy Scale: Fair     Standing balance support: Bilateral upper extremity supported, Reliant on assistive device for balance Standing balance-Leahy Scale: Fair                             ADL either performed or assessed with clinical judgement   ADL Overall ADL's : Needs assistance/impaired                         Toilet Transfer: Contact guard assist;Minimal assistance;Rolling walker (2 wheels)   Toileting- Clothing Manipulation and Hygiene: Contact guard assist;Minimal assistance;Sit to/from stand               Vision Patient Visual Report: No change from baseline           Communication Communication Communication: Impaired Factors Affecting Communication: Difficulty expressing self;Hearing impaired   Cognition Arousal: Alert Behavior During Therapy: Impulsive, Agitated, Restless Cognition: No family/caregiver present to determine baseline                               Following commands: Impaired Following commands impaired: Follows one step commands with increased time, Follows one  step commands inconsistently      Cueing   Cueing Techniques: Verbal cues, Gestural cues, Tactile cues, Visual cues             Pertinent Vitals/ Pain       Pain Assessment Pain Assessment: Faces Faces Pain Scale: No hurt         Frequency  Min 3X/week        Progress Toward Goals  OT Goals(current goals can now be found in the care plan section)  Progress towards OT goals: Progressing toward goals     Plan      AM-PAC OT "6 Clicks" Daily Activity     Outcome Measure   Help from another person eating meals?: None Help from another person taking care of personal grooming?: A Little Help from another person toileting, which includes  using toliet, bedpan, or urinal?: A Lot Help from another person bathing (including washing, rinsing, drying)?: A Lot Help from another person to put on and taking off regular upper body clothing?: A Little Help from another person to put on and taking off regular lower body clothing?: A Lot 6 Click Score: 16    End of Session Equipment Utilized During Treatment: Rolling walker (2 wheels)  OT Visit Diagnosis: Other abnormalities of gait and mobility (R26.89);History of falling (Z91.81)   Activity Tolerance Patient tolerated treatment well   Patient Left with call bell/phone within reach;in chair;with chair alarm set   Nurse Communication          Time: 5427-0623 OT Time Calculation (min): 10 min  Charges: OT General Charges $OT Visit: 1 Visit OT Treatments $Self Care/Home Management : 8-22 mins  Jackquline Denmark, MS, OTR/L , CBIS ascom [phone]  03/03/24, 3:55 PM

## 2024-03-04 ENCOUNTER — Observation Stay

## 2024-03-04 DIAGNOSIS — G9341 Metabolic encephalopathy: Secondary | ICD-10-CM

## 2024-03-04 DIAGNOSIS — R531 Weakness: Secondary | ICD-10-CM | POA: Diagnosis not present

## 2024-03-04 DIAGNOSIS — I480 Paroxysmal atrial fibrillation: Secondary | ICD-10-CM | POA: Diagnosis not present

## 2024-03-04 DIAGNOSIS — W19XXXA Unspecified fall, initial encounter: Secondary | ICD-10-CM | POA: Diagnosis not present

## 2024-03-04 LAB — GLUCOSE, CAPILLARY
Glucose-Capillary: 184 mg/dL — ABNORMAL HIGH (ref 70–99)
Glucose-Capillary: 74 mg/dL (ref 70–99)
Glucose-Capillary: 90 mg/dL (ref 70–99)
Glucose-Capillary: 66 mg/dL — ABNORMAL LOW (ref 70–99)
Glucose-Capillary: 102 mg/dL — ABNORMAL HIGH (ref 70–99)

## 2024-03-04 MED ORDER — ORAL CARE MOUTH RINSE
15.0000 mL | OROMUCOSAL | Status: DC
  Administered 2024-03-04 – 2024-03-08 (×17): 15 mL via OROMUCOSAL

## 2024-03-04 MED ORDER — ORAL CARE MOUTH RINSE: 15.0000 mL | OROMUCOSAL | Status: DC | PRN

## 2024-03-04 NOTE — Progress Notes (Signed)
 PT Cancellation Note  Patient Details Name: Sean Hayden MRN: [id number] DOB: [birth year]   Cancelled Treatment:     Pt off floor for head CT due to AMS and significant drowsiness. Will await results prior to proceeding with skilled PT session.    Jannet Askew 03/04/2024, 3:09 PM

## 2024-03-04 NOTE — TOC Transition Note (Signed)
 Transition of Care [facility]) - Discharge Note   Patient Details  Name: Sean Hayden MRN: [id number] Date of Birth: [birth year]  Transition of [facility] CM/SW Contact:  Truddie Hidden, RN Phone Number: 03/04/2024, 11:48 AM   Clinical Narrative:    Spoke with Darrian in admissions at [facility].  Patient insurance requires a $10 per day copay. She has spoken to patient's spouse and patient has been obtain.  Patient has been approved for admission today. Per [employer] portal Berkley Harvey is approved.  MD notified.        Barriers to Discharge: Continued Medical Work up   Patient Goals and CMS Choice   CMS Medicare.gov Compare Post Acute Care list provided to:: Patient Choice offered to / list presented to : Patient      Discharge Placement                       Discharge Plan and Services Additional resources added to the After Visit Summary for       Post Acute Care Choice: Skilled Nursing Facility                               Social Drivers of Health (SDOH) Interventions SDOH Screenings   Food Insecurity: No Food Insecurity (03/01/2024)  Housing: Low Risk  (03/01/2024)  Transportation Needs: No Transportation Needs (03/01/2024)  Utilities: Not At Risk (03/01/2024)  Alcohol Screen: Low Risk  (01/13/2024)  Depression (PHQ2-9): Low Risk  (01/13/2024)  Financial Resource Strain: Low Risk  (01/13/2024)  Physical Activity: Inactive (01/13/2024)  Social Connections: Moderately Isolated (03/01/2024)  Stress: No Stress Concern Present (01/13/2024)  Tobacco Use: Medium Risk (03/01/2024)  Health Literacy: Adequate Health Literacy (01/13/2024)     Readmission Risk Interventions     No data to display

## 2024-03-04 NOTE — Plan of Care (Signed)
 Problem: Education: Goal: Ability to describe self-care measures that may prevent or decrease complications (Diabetes Survival Skills Education) will improve 03/04/2024 1616 by Lowella Petties, RN Outcome: Progressing 03/04/2024 1616 by Lowella Petties, RN Outcome: Progressing Goal: Individualized Educational Video(s) 03/04/2024 1616 by Lowella Petties, RN Outcome: Progressing 03/04/2024 1616 by Lowella Petties, RN Outcome: Progressing   Problem: Coping: Goal: Ability to adjust to condition or change in health will improve 03/04/2024 1616 by Lowella Petties, RN Outcome: Progressing 03/04/2024 1616 by Lowella Petties, RN Outcome: Progressing   Problem: Fluid Volume: Goal: Ability to maintain a balanced intake and output will improve 03/04/2024 1616 by Lowella Petties, RN Outcome: Progressing 03/04/2024 1616 by Lowella Petties, RN Outcome: Progressing   Problem: Health Behavior/Discharge Planning: Goal: Ability to identify and utilize available resources and services will improve 03/04/2024 1616 by Lowella Petties, RN Outcome: Progressing 03/04/2024 1616 by Lowella Petties, RN Outcome: Progressing Goal: Ability to manage health-related needs will improve 03/04/2024 1616 by Lowella Petties, RN Outcome: Progressing 03/04/2024 1616 by Lowella Petties, RN Outcome: Progressing   Problem: Metabolic: Goal: Ability to maintain appropriate glucose levels will improve 03/04/2024 1616 by Lowella Petties, RN Outcome: Progressing 03/04/2024 1616 by Lowella Petties, RN Outcome: Progressing   Problem: Nutritional: Goal: Maintenance of adequate nutrition will improve 03/04/2024 1616 by Lowella Petties, RN Outcome: Progressing 03/04/2024 1616 by Lowella Petties, RN Outcome: Progressing Goal: Progress toward achieving an optimal weight will improve 03/04/2024 1616 by Lowella Petties, RN Outcome:  Progressing 03/04/2024 1616 by Lowella Petties, RN Outcome: Progressing   Problem: Skin Integrity: Goal: Risk for impaired skin integrity will decrease 03/04/2024 1616 by Lowella Petties, RN Outcome: Progressing 03/04/2024 1616 by Lowella Petties, RN Outcome: Progressing   Problem: Tissue Perfusion: Goal: Adequacy of tissue perfusion will improve 03/04/2024 1616 by Lowella Petties, RN Outcome: Progressing 03/04/2024 1616 by Lowella Petties, RN Outcome: Progressing   Problem: Education: Goal: Knowledge of General Education information will improve Description: Including pain rating scale, medication(s)/side effects and non-pharmacologic comfort measures 03/04/2024 1616 by Lowella Petties, RN Outcome: Progressing 03/04/2024 1616 by Lowella Petties, RN Outcome: Progressing   Problem: Health Behavior/Discharge Planning: Goal: Ability to manage health-related needs will improve 03/04/2024 1616 by Lowella Petties, RN Outcome: Progressing 03/04/2024 1616 by Lowella Petties, RN Outcome: Progressing   Problem: Clinical Measurements: Goal: Ability to maintain clinical measurements within normal limits will improve 03/04/2024 1616 by Lowella Petties, RN Outcome: Progressing 03/04/2024 1616 by Lowella Petties, RN Outcome: Progressing Goal: Will remain free from infection 03/04/2024 1616 by Lowella Petties, RN Outcome: Progressing 03/04/2024 1616 by Lowella Petties, RN Outcome: Progressing Goal: Diagnostic test results will improve 03/04/2024 1616 by Lowella Petties, RN Outcome: Progressing 03/04/2024 1616 by Lowella Petties, RN Outcome: Progressing Goal: Respiratory complications will improve 03/04/2024 1616 by Lowella Petties, RN Outcome: Progressing 03/04/2024 1616 by Lowella Petties, RN Outcome: Progressing Goal: Cardiovascular complication will be avoided 03/04/2024 1616 by Lowella Petties,  RN Outcome: Progressing 03/04/2024 1616 by Lowella Petties, RN Outcome: Progressing   Problem: Activity: Goal: Risk for activity intolerance will decrease 03/04/2024 1616 by Lowella Petties, RN Outcome: Progressing 03/04/2024 1616 by Lowella Petties, RN Outcome: Progressing   Problem: Nutrition: Goal: Adequate nutrition will be maintained 03/04/2024 1616 by Lowella Petties, RN Outcome: Progressing 03/04/2024 1616 by Lowella Petties, RN Outcome: Progressing  Problem: Coping: Goal: Level of anxiety will decrease 03/04/2024 1616 by Lowella Petties, RN Outcome: Progressing 03/04/2024 1616 by Lowella Petties, RN Outcome: Progressing   Problem: Elimination: Goal: Will not experience complications related to bowel motility 03/04/2024 1616 by Lowella Petties, RN Outcome: Progressing 03/04/2024 1616 by Lowella Petties, RN Outcome: Progressing Goal: Will not experience complications related to urinary retention 03/04/2024 1616 by Lowella Petties, RN Outcome: Progressing 03/04/2024 1616 by Lowella Petties, RN Outcome: Progressing   Problem: Pain Managment: Goal: General experience of comfort will improve and/or be controlled 03/04/2024 1616 by Lowella Petties, RN Outcome: Progressing 03/04/2024 1616 by Lowella Petties, RN Outcome: Progressing   Problem: Safety: Goal: Ability to remain free from injury will improve 03/04/2024 1616 by Lowella Petties, RN Outcome: Progressing 03/04/2024 1616 by Lowella Petties, RN Outcome: Progressing   Problem: Skin Integrity: Goal: Risk for impaired skin integrity will decrease 03/04/2024 1616 by Lowella Petties, RN Outcome: Progressing 03/04/2024 1616 by Lowella Petties, RN Outcome: Progressing

## 2024-03-04 NOTE — Inpatient Diabetes Management (Signed)
 Inpatient Diabetes Program Recommendations  AACE/ADA: New Consensus Statement on Inpatient Glycemic Control (2015)  Target Ranges:  Prepandial:   less than 140 mg/dL      Peak postprandial:   less than 180 mg/dL (1-2 hours)      Critically ill patients:  140 - 180 mg/dL    Latest Reference Range & Units 03/03/24 08:50 03/03/24 11:41 03/03/24 16:49 03/03/24 20:18  Glucose-Capillary 70 - 99 mg/dL 846 (H)  1 unit Novolog  30 units Semglee  196 (H)  2 units Novolog  184 (H)  2 units Novolog  72  (H): Data is abnormally high  Latest Reference Range & Units 03/04/24 07:46  Glucose-Capillary 70 - 99 mg/dL 74     Home DM Meds: Levemir 60 units bid Metformin 850 mg bid Jardiance 12.5 mg daily   Current Orders: Novolog Sensitive Correction Scale/ SSI (0-9 units) TID AC + HS Semglee 30 units daily Jardiance 25 mg daily     MD- Note CBG only 74 this AM  Please reduce Semglee to 25 units daily    --Will follow patient during hospitalization--  Ambrose Finland RN, MSN, CDCES Diabetes Coordinator Inpatient Glycemic Control Team Team Pager: [phone] (8a-5p)

## 2024-03-04 NOTE — Progress Notes (Signed)
 Progress Note   Patient: Sean Hayden:[id number] DOB: Jan 24, 1946 DOA: 03/01/2024     1 DOS: the patient was seen and examined on 03/04/2024   Brief hospital course: Sean Hayden is a 78 y.o. male with medical history significant for aortic stenosis, carotid stenosis, gout, asthma, vitamin B12 deficiency, oral cancer status post partial glossectomy hypertension, dyslipidemia, HFrEF, seizure disorder, type 2 diabetes mellitus, s/p TAVR, who presented to the emergency room with acute onset of generalized weakness and feeling of his "legs give out". His electrolytes abnormal, has Afib with RVR admitted to [facility] service.  During hospital stay, his HR better controlled, electrolytes replaced accordingly. PT/ OT advised SNF placement. He was confused yesterday, got Ativan. Today more sleepy and lethargic. Discharge held.  Assessment and Plan: * Generalized weakness Fall without injuries. Continue to monitor vitals closely. PT/ OT advised placement. His HR better. Blood sugars better. Continue insulin 30 units daily. Monitor for hypoglycemia.  Acute metabolic encephalopathy I will get CT head due to his lethargy. Will stop xanax. Delirium precautions. Continue neurochecks. Fall, aspiration precautions.  Hypomagnesemia Replaced.   Paroxysmal atrial fibrillation with RVR (HCC) HR improved. Continue telemetry. As needed IV Cardizem. Continue Xarelto and beta-blocker therapy.  Dyslipidemia Continue statin therapy.  Type 2 diabetes mellitus with peripheral neuropathy (HCC) Hypoglycemia Decreased long acting insulin to 30units daily with sliding scale. Hold metformin. Continue Neurontin. Hypoglycemia protocol.  Seizure disorder (HCC) Continue Keppra.  Depression Continue Paxil.  Essential hypertension Continue antihypertensive therapy.  Obesity class 1 BMI 31.92 Diet, exercise and weight reduction advised.    Tele sitter order placed for safety. Out of bed to  chair. Incentive spirometry. Nursing supportive care. Fall, aspiration precautions. Diet:  Diet Orders (From admission, onward)     Start     Ordered   03/01/24 1659  DIET DYS 2 Room service appropriate? Yes; Fluid consistency: Thin  Diet effective now       Question Answer Comment  Room service appropriate? Yes   Fluid consistency: Thin      03/01/24 1658           DVT prophylaxis:  rivaroxaban (XARELTO) tablet 20 mg  Level of care: tele/ medical   Code Status: Full Code  Subjective: Patient is seen and examined today morning. He is lying in bed, more lethargic today. No family at bedside. He was more agitated yesterday requiring tele sitter.  Physical Exam: Vitals:   03/03/24 2306 03/04/24 0319 03/04/24 0502 03/04/24 1123  BP: 96/71 112/67  114/73  Pulse: 97 (!) 102  97  Resp: 20 18  16   Temp: 97.8 F (36.6 C) 97.7 F (36.5 C)  (!) 97.2 F (36.2 C)  TempSrc: Axillary Oral    SpO2: 95% 96%  99%  Weight:   90.1 kg   Height:        General - Elderly obese Caucasian male, no apparent distress HEENT - PERRLA, EOMI, atraumatic head, non tender sinuses. Lung - Clear, basal rales, no rhonchi, wheezes. Heart - S1, S2 heard, no murmurs, rubs, trace pedal edema. Abdomen - Soft, non tender, obese, bowel sounds good Neuro - sleepy and lethargic, not following cammands. Skin - Warm and dry.  Data Reviewed:      Latest Ref Rng & Units 03/01/2024    6:10 AM 03/01/2024    1:25 AM 01/29/2023    7:53 PM  CBC  WBC 4.0 - 10.5 K/uL 5.9  7.7  6.5   Hemoglobin 13.0 - 17.0 g/dL  15.1  16.2  15.3   Hematocrit 39.0 - 52.0 % 46.0  48.6  46.9   Platelets 150 - 400 K/uL 175  192  150       Latest Ref Rng & Units 03/01/2024    6:10 AM 03/01/2024    1:25 AM 03/17/2023    9:21 AM  BMP  Glucose 70 - 99 mg/dL 578  469  629   BUN 8 - 23 mg/dL 11  11  23    Creatinine 0.61 - 1.24 mg/dL 5.28  4.13  2.44   Sodium 135 - 145 mmol/L 139  137  138   Potassium 3.5 - 5.1 mmol/L 4.6  4.7  3.9    Chloride 98 - 111 mmol/L 105  101  101   CO2 22 - 32 mmol/L 28  27  30    Calcium 8.9 - 10.3 mg/dL 9.0  9.2  8.7    No results found.   Family Communication: Discussed with patient's son yesterday. He  understand and agree. All questions answered.  Disposition: Status is: Observation The patient remains OBS appropriate and will d/c before 2 midnights.  Planned Discharge Destination: Skilled nursing facility     Time spent: 39 minutes  Author: Marcelino Duster, MD 03/04/2024 1:49 PM Secure chat 7am to 7pm For on call review [email or web address].

## 2024-03-05 DIAGNOSIS — R531 Weakness: Secondary | ICD-10-CM | POA: Diagnosis not present

## 2024-03-05 LAB — GLUCOSE, CAPILLARY
Glucose-Capillary: 117 mg/dL — ABNORMAL HIGH (ref 70–99)
Glucose-Capillary: 95 mg/dL (ref 70–99)
Glucose-Capillary: 96 mg/dL (ref 70–99)
Glucose-Capillary: 137 mg/dL — ABNORMAL HIGH (ref 70–99)
Glucose-Capillary: 124 mg/dL — ABNORMAL HIGH (ref 70–99)
Glucose-Capillary: 111 mg/dL — ABNORMAL HIGH (ref 70–99)

## 2024-03-05 NOTE — Progress Notes (Signed)
 Yellow ring/band found on the floor, placed in a bag with Pt's label, left in a drawer at nursing desk.  Made charge nurse and secretary aware.

## 2024-03-05 NOTE — Care Management Important Message (Signed)
 Important Message  Patient Details  Name: Sean Hayden MRN: [id number] Date of Birth: 01/05/1946   Important Message Given:  Yes - Medicare IM     Bernadette Hoit 03/05/2024, 10:43 AM

## 2024-03-05 NOTE — TOC Progression Note (Signed)
 Transition of Care [facility]) - Progression Note    Patient Details  Name: Sean Hayden MRN: [id number] Date of Birth: 07/12/1946  [facility] CM/SW Contact  Margarito Liner, LCSW Phone Number: 03/05/2024, 12:16 PM  Clinical Narrative:  Patient had a telesitter in the room this morning. SNF is aware. Admissions coordinator stated that as long as patient is not combative, they can still take him today. CSW sent secure chat to MD and RN to notify.   Expected Discharge Plan: Skilled Nursing Facility Barriers to Discharge: Continued Medical Work up  Expected Discharge Plan and Services     Post Acute Care Choice: Skilled Nursing Facility Living arrangements for the past 2 months: Single Family Home                                       Social Determinants of Health (SDOH) Interventions SDOH Screenings   Food Insecurity: No Food Insecurity (03/01/2024)  Housing: Low Risk  (03/01/2024)  Transportation Needs: No Transportation Needs (03/01/2024)  Utilities: Not At Risk (03/01/2024)  Alcohol Screen: Low Risk  (01/13/2024)  Depression (PHQ2-9): Low Risk  (01/13/2024)  Financial Resource Strain: Low Risk  (01/13/2024)  Physical Activity: Inactive (01/13/2024)  Social Connections: Moderately Isolated (03/01/2024)  Stress: No Stress Concern Present (01/13/2024)  Tobacco Use: Medium Risk (03/01/2024)  Health Literacy: Adequate Health Literacy (01/13/2024)    Readmission Risk Interventions     No data to display

## 2024-03-05 NOTE — Progress Notes (Signed)
 Progress Note   Patient: Sean Hayden JXB:[id number] DOB: [birth year] DOA: 03/01/2024     1 DOS: the patient was seen and examined on 03/05/2024   Brief hospital course: Sean Hayden is a 78 y.o. male with medical history significant for aortic stenosis, carotid stenosis, gout, asthma, vitamin B12 deficiency, oral cancer status post partial glossectomy hypertension, dyslipidemia, HFrEF, seizure disorder, type 2 diabetes mellitus, s/p TAVR, who presented to the emergency room with acute onset of generalized weakness and feeling of his "legs give out". His electrolytes abnormal, has Afib with RVR admitted to [facility] service.  During hospital stay, his HR better controlled, electrolytes replaced accordingly. PT/ OT advised SNF placement. He was confused yesterday, got Ativan. Today more sleepy and lethargic.  Subsequently also had agitation episodes.  Heart rate is well-controlled.  This morning appeared to be more oriented taken off restraints/mittens and will monitor how he does.  If continues to do well can be discharged to skilled nursing facility tomorrow .  Assessment and Plan: * Generalized weakness Fall without injuries. Continue to monitor vitals closely. PT/ OT advised placement. His HR better. Blood sugars better. Continue insulin 30 units daily. Monitor for hypoglycemia.  Acute metabolic encephalopathy I will get CT head due to his lethargy. Will stop xanax. Delirium precautions. Continue neurochecks. Fall, aspiration precautions. Today appears to be alert answers questions appropriately. However yesterday patient had periods of agitation and had to be placed on mittens.  Will monitor and see how he does today without any restraints  Hypomagnesemia Replaced.   Paroxysmal atrial fibrillation with RVR (HCC) HR improved. Continue telemetry. As needed IV Cardizem. Continue Xarelto and beta-blocker therapy.  Dyslipidemia Continue statin therapy.  Type 2 diabetes mellitus  with peripheral neuropathy (HCC) Hypoglycemia Decreased long acting insulin to 30units daily with sliding scale. Hold metformin. Continue Neurontin. Hypoglycemia protocol.  Seizure disorder (HCC) Continue Keppra.  Depression Continue Paxil.  Essential hypertension Continue antihypertensive therapy.  Obesity class 1 BMI 31.92 Diet, exercise and weight reduction advised.    Tele sitter order placed for safety. Out of bed to chair. Incentive spirometry. Nursing supportive care. Fall, aspiration precautions. Diet:  Diet Orders (From admission, onward)     Start     Ordered   03/01/24 1659  DIET DYS 2 Room service appropriate? Yes; Fluid consistency: Thin  Diet effective now       Question Answer Comment  Room service appropriate? Yes   Fluid consistency: Thin      03/01/24 1658           DVT prophylaxis:  rivaroxaban (XARELTO) tablet 20 mg  Level of care: tele/ medical   Code Status: Full Code  Subjective: Patient is seen and examined today morning.  Patient was sleeping easily arousable and was able to answer questions.  No family at bedside. He was more agitated yesterday requiring tele sitter.  Physical Exam: Vitals:   03/05/24 0500 03/05/24 0622 03/05/24 0813 03/05/24 1134  BP:   115/79 109/70  Pulse:   91 88  Resp:  17    Temp:   (!) 97 F (36.1 C) 98.2 F (36.8 C)  TempSrc:      SpO2:   93% 95%  Weight: 90.3 kg     Height:        General - Elderly obese Caucasian male, no apparent distress HEENT - PERRLA, EOMI, atraumatic head, non tender sinuses. Lung - Clear, basal rales, no rhonchi, wheezes. Heart - S1, S2 heard, no murmurs, rubs,  trace pedal edema. Abdomen - Soft, non tender, obese, bowel sounds good Neuro - sleepy and lethargic, not following cammands. Skin - Warm and dry.  Data Reviewed:      Latest Ref Rng & Units 03/01/2024    6:10 AM 03/01/2024    1:25 AM 01/29/2023    7:53 PM  CBC  WBC 4.0 - 10.5 K/uL 5.9  7.7  6.5    Hemoglobin 13.0 - 17.0 g/dL 19.1  47.8  29.5   Hematocrit 39.0 - 52.0 % 46.0  48.6  46.9   Platelets 150 - 400 K/uL 175  192  150       Latest Ref Rng & Units 03/01/2024    6:10 AM 03/01/2024    1:25 AM 03/17/2023    9:21 AM  BMP  Glucose 70 - 99 mg/dL 621  308  657   BUN 8 - 23 mg/dL 11  11  23    Creatinine 0.61 - 1.24 mg/dL 8.46  9.62  9.52   Sodium 135 - 145 mmol/L 139  137  138   Potassium 3.5 - 5.1 mmol/L 4.6  4.7  3.9   Chloride 98 - 111 mmol/L 105  101  101   CO2 22 - 32 mmol/L 28  27  30    Calcium 8.9 - 10.3 mg/dL 9.0  9.2  8.7    CT HEAD WO CONTRAST ( ) Result Date: 03/04/2024 CLINICAL DATA:  78 year old male with altered mental status. EXAM: CT HEAD WITHOUT CONTRAST TECHNIQUE: Contiguous axial images were obtained from the base of the skull through the vertex without intravenous contrast. RADIATION DOSE REDUCTION: This exam was performed according to the departmental dose-optimization program which includes automated exposure control, adjustment of the mA and/or kV according to patient size and/or use of iterative reconstruction technique. COMPARISON:  Head CT 03/01/2024 and earlier. FINDINGS: Brain: Patchy and confluent bilateral cerebral white matter hypodensity with superimposed chronic posterior left MCA and/or ACA territory cortical encephalomalacia. No midline shift, ventriculomegaly, mass effect, evidence of mass lesion, intracranial hemorrhage or evidence of cortically based acute infarction. Vascular: Extensive Calcified atherosclerosis at the skull base. No suspicious intracranial vascular hyperdensity. Skull: Stable.  No acute osseous abnormality identified. Sinuses/Orbits: Visualized paranasal sinuses and mastoids are stable and well aerated. Other: Stable orbit and scalp soft tissues. Right forehead scalp hematoma or contusion. No underlying calvarium fracture. IMPRESSION: 1. Right forehead scalp hematoma or contusion. No underlying skull fracture. 2. No acute intracranial  abnormality. Stable non contrast CT appearance of advanced chronic ischemic disease. Electronically Signed   By: Odessa Fleming M.D.   On: 03/04/2024 16:58     Family Communication: Discussed with patient's son yesterday. He  understand and agree. All questions answered.  Disposition: Status is: Observation The patient remains OBS appropriate and will d/c before 2 midnights.  Planned Discharge Destination: Skilled nursing facility     Time spent: 39 minutes  Author: Harold Hedge, MD 03/05/2024 2:03 PM Secure chat 7am to 7pm For on call review [email or web address].

## 2024-03-06 DIAGNOSIS — R531 Weakness: Secondary | ICD-10-CM | POA: Diagnosis not present

## 2024-03-06 LAB — COMPREHENSIVE METABOLIC PANEL WITH GFR
ALT: 19 U/L (ref 0–44)
AST: 25 U/L (ref 15–41)
Albumin: 3.1 g/dL — ABNORMAL LOW (ref 3.5–5.0)
Alkaline Phosphatase: 94 U/L (ref 38–126)
Anion gap: 9 (ref 5–15)
BUN: 20 mg/dL (ref 8–23)
CO2: 28 mmol/L (ref 22–32)
Calcium: 9.5 mg/dL (ref 8.9–10.3)
Chloride: 101 mmol/L (ref 98–111)
Creatinine, Ser: 0.94 mg/dL (ref 0.61–1.24)
GFR, Estimated: >60 mL/min (ref >60)
Glucose, Bld: 120 mg/dL — ABNORMAL HIGH (ref 70–99)
Potassium: 4.6 mmol/L (ref 3.5–5.1)
Sodium: 138 mmol/L (ref 135–145)
Total Bilirubin: 1.1 mg/dL (ref 0.0–1.2)
Total Protein: 6.5 g/dL (ref 6.5–8.1)

## 2024-03-06 LAB — CBC
HCT: 46.7 % (ref 39.0–52.0)
Hemoglobin: 15.9 g/dL (ref 13.0–17.0)
MCH: 31.1 pg (ref 26.0–34.0)
MCHC: 34 g/dL (ref 30.0–36.0)
MCV: 91.4 fL (ref 80.0–100.0)
Platelets: 208 10*3/uL (ref 150–400)
RBC: 5.11 MIL/uL (ref 4.22–5.81)
RDW: 13.9 % (ref 11.5–15.5)
WBC: 7.7 10*3/uL (ref 4.0–10.5)
nRBC: 0 % (ref 0.0–0.2)

## 2024-03-06 LAB — GLUCOSE, CAPILLARY
Glucose-Capillary: 155 mg/dL — ABNORMAL HIGH (ref 70–99)
Glucose-Capillary: 252 mg/dL — ABNORMAL HIGH (ref 70–99)
Glucose-Capillary: 180 mg/dL — ABNORMAL HIGH (ref 70–99)
Glucose-Capillary: 186 mg/dL — ABNORMAL HIGH (ref 70–99)

## 2024-03-06 NOTE — TOC Progression Note (Addendum)
 Transition of Care [facility]) - Progression Note    Patient Details  Name: Sean Hayden MRN: [id number] Date of Birth: 10-19-1946  Transition of Care [facility]) CM/SW Contact  Bing Quarry, RN Phone Number: 03/06/2024, 12:42 PM  Clinical Narrative: 3/29: Left Message at [facility] number and requested a call back regarding admission of patient.  130 pm UPDATE: Received message from [facility] that will take on Monday. Updated provider.    Gabriel Cirri MSN RN CM  RN Case Manager [place]  Transitions of Care Direct Dial: [phone] (Weekends Only) [facility] Main Office Phone: [phone] [facility] Fax: [phone] [email or web address]       Expected Discharge Plan: Skilled Nursing Facility Barriers to Discharge: Continued Medical Work up  Expected Discharge Plan and Services     Post Acute Care Choice: Skilled Nursing Facility Living arrangements for the past 2 months: Single Family Home                                       Social Determinants of Health (SDOH) Interventions SDOH Screenings   Food Insecurity: No Food Insecurity (03/01/2024)  Housing: Low Risk  (03/01/2024)  Transportation Needs: No Transportation Needs (03/01/2024)  Utilities: Not At Risk (03/01/2024)  Alcohol Screen: Low Risk  (01/13/2024)  Depression (PHQ2-9): Low Risk  (01/13/2024)  Financial Resource Strain: Low Risk  (01/13/2024)  Physical Activity: Inactive (01/13/2024)  Social Connections: Moderately Isolated (03/01/2024)  Stress: No Stress Concern Present (01/13/2024)  Tobacco Use: Medium Risk (03/01/2024)  Health Literacy: Adequate Health Literacy (01/13/2024)    Readmission Risk Interventions     No data to display

## 2024-03-06 NOTE — Progress Notes (Signed)
 Progress Note   Patient: Sean Hayden [id number]:[id number] DOB: [birth year] DOA: 03/01/2024     1 DOS: the patient was seen and examined on 03/06/2024   Brief hospital course: Sean Hayden is a 78 y.o. male with medical history significant for aortic stenosis, carotid stenosis, gout, asthma, vitamin B12 deficiency, oral cancer status post partial glossectomy hypertension, dyslipidemia, HFrEF, seizure disorder, type 2 diabetes mellitus, s/p TAVR, who presented to the emergency room with acute onset of generalized weakness and feeling of his "legs give out". His electrolytes abnormal, has Afib with RVR admitted to [facility] service.  During hospital stay, his HR better controlled, electrolytes replaced accordingly. PT/ OT advised SNF placement. He was confused yesterday, got Ativan.The patient is awake and alert today.  Heart rate is well-controlled. On the morning of 03/06/2024 the patient appeared to be more oriented. He has been taken off restraints/mittens and will monitor how he does. It appears that the patient may be discharged to [facility] on Monday.   Assessment and Plan: * Generalized weakness Fall without injuries. Continue to monitor vitals closely. PT/ OT advised placement. His HR better. Acute metabolic encephalopathy CT head was obtained on 03/04/2024 that demonstrated stable appearance of advanced chronic ischemic disease. Will stop xanax. Delirium precautions. Continue neurochecks. Fall, aspiration precautions. Today appears to be alert answers questions appropriately. Calm so far today.   Hypomagnesemia Resolved.  Paroxysmal atrial fibrillation with RVR (HCC) HR has ranged betweem 93 and 117 in the last 24 hours.  Continue telemetry. Diltiazem has been discontinued. The patient's heart rate is controlled on metoprolol tartrate 12.5 mg bid.  Would increase metoprolol, but it appears that the patient's blood pressure will not allow increase in dose. Will decrease lisinopril to  2.5 to allow for increase in dose of metoprolol.  He is on xarelto for stroke prophylaxis.   Dyslipidemia Continue statin therapy.  Type 2 diabetes mellitus with peripheral neuropathy (HCC) Hypoglycemia Decreased long acting insulin to 30units daily with sliding scale. Hold metformin. Continue Neurontin. Hypoglycemia protocol. Glucoses have run between 95 and 252 in the last 24 hours.   Seizure disorder (HCC) Continue Keppra.  Depression Continue Paxil.  Essential hypertension Continue antihypertensive therapy.  Obesity class 1 BMI 31.92 Diet, exercise and weight reduction advised.    Tele sitter has been discontinued as the patient appears calm and cooperative.  Out of bed to chair. Incentive spirometry. Nursing supportive care. Fall, aspiration precautions. Diet:  Diet Orders (From admission, onward)     Start     Ordered   03/01/24 1659  DIET DYS 2 Room service appropriate? Yes; Fluid consistency: Thin  Diet effective now       Question Answer Comment  Room service appropriate? Yes   Fluid consistency: Thin      03/01/24 1658           DVT prophylaxis:  rivaroxaban (XARELTO) tablet 20 mg  Level of care: tele/ medical   Code Status: Full Code  Subjective: Patient is sitting up in bed comfortably. No new complaints.   Physical Exam: Vitals:   03/06/24 0536 03/06/24 0806 03/06/24 1148 03/06/24 1326  BP:  121/71 (!) 100/59 99/63  Pulse:   (!) 101 93  Resp:  15 19 17   Temp:  98.2 F (36.8 C) 98 F (36.7 C) 98 F (36.7 C)  TempSrc:  Oral Oral   SpO2:   93%   Weight: 89.6 kg     Height:  Exam:  Constitutional:  The patient is awake, alert, and oriented x 3. No acute distress. Respiratory:  No increased work of breathing. No wheezes, rales, or rhonchi No tactile fremitus Cardiovascular:  Regular rate and rhythm No murmurs, ectopy, or gallups. No lateral PMI. No thrills. Abdomen:  Abdomen is soft, non-tender, non-distended No  hernias, masses, or organomegaly Normoactive bowel sounds.  Musculoskeletal:  No cyanosis, clubbing, or edema Skin:  No rashes, lesions, ulcers palpation of skin: no induration or nodules Neurologic:  CN 2-12 intact Sensation all 4 extremities intact Psychiatric:  Mental status Mood, affect appropriate Orientation to person, place, time  judgment and insight appear intact .  Data Reviewed:      Latest Ref Rng & Units 03/06/2024    8:57 AM 03/01/2024    6:10 AM 03/01/2024    1:25 AM  CBC  WBC 4.0 - 10.5 K/uL 7.7  5.9  7.7   Hemoglobin 13.0 - 17.0 g/dL 29.5  62.1  30.8   Hematocrit 39.0 - 52.0 % 46.7  46.0  48.6   Platelets 150 - 400 K/uL 208  175  192       Latest Ref Rng & Units 03/06/2024    8:57 AM 03/01/2024    6:10 AM 03/01/2024    1:25 AM  BMP  Glucose 70 - 99 mg/dL 657  846  962   BUN 8 - 23 mg/dL 20  11  11    Creatinine 0.61 - 1.24 mg/dL 9.52  8.41  3.24   Sodium 135 - 145 mmol/L 138  139  137   Potassium 3.5 - 5.1 mmol/L 4.6  4.6  4.7   Chloride 98 - 111 mmol/L 101  105  101   CO2 22 - 32 mmol/L 28  28  27    Calcium 8.9 - 10.3 mg/dL 9.5  9.0  9.2    No results found.    Family Communication: Discussed with patient's son yesterday. He  understand and agree. All questions answered.  Disposition: Status is: Observation The patient remains OBS appropriate and will d/c before 2 midnights.  Planned Discharge Destination: Skilled nursing facility     Time spent: 32 minutes  Author: Parks Czajkowski, DO 03/06/2024 3:41 PM Secure chat 7am to 7pm For on call review [email or web address].

## 2024-03-06 NOTE — Progress Notes (Signed)
   03/06/24 1148  Assess: MEWS Score  Temp 98 F (36.7 C)  BP (!) 100/59  MAP (mmHg) 71  Pulse Rate (!) 101  ECG Heart Rate (!) 103  Resp 19  SpO2 93 %  Assess: MEWS Score  MEWS Temp 0  MEWS Systolic 1  MEWS Pulse 1  MEWS RR 0  MEWS LOC 0  MEWS Score 2  MEWS Score Color Yellow  Assess: if the MEWS score is Yellow or Red  Were vital signs accurate and taken at a resting state? Yes  Does the patient meet 2 or more of the SIRS criteria? No  MEWS guidelines implemented  Yes, yellow  Treat  MEWS Interventions Considered administering scheduled or prn medications/treatments as ordered  Take Vital Signs  Increase Vital Sign Frequency  Yellow: Q2hr x1, continue Q4hrs until patient remains green for 12hrs  Escalate  MEWS: Escalate Yellow: Discuss with charge nurse and consider notifying provider and/or RRT  Notify: Charge Nurse/RN  Name of Charge Nurse/RN Notified [place]  Provider Notification  Provider Name/Title Swayza  Date Provider Notified 03/06/24  Time Provider Notified 1151  Method of Notification Page  Notification Reason Change in status  Provider response Evaluate remotely  Date of Provider Response 03/06/24  Time of Provider Response 1150  Assess: SIRS CRITERIA  SIRS Temperature  0  SIRS Respirations  0  SIRS Pulse 1  SIRS WBC 0  SIRS Score Sum  1

## 2024-03-06 NOTE — Plan of Care (Signed)
  Problem: Education: Goal: Ability to describe self-care measures that may prevent or decrease complications (Diabetes Survival Skills Education) will improve Outcome: Adequate for Discharge   Problem: Education: Goal: Ability to describe self-care measures that may prevent or decrease complications (Diabetes Survival Skills Education) will improve Outcome: Adequate for Discharge Goal: Individualized Educational Video(s) Outcome: Adequate for Discharge   Problem: Coping: Goal: Ability to adjust to condition or change in health will improve Outcome: Adequate for Discharge   Problem: Fluid Volume: Goal: Ability to maintain a balanced intake and output will improve Outcome: Adequate for Discharge   Problem: Health Behavior/Discharge Planning: Goal: Ability to identify and utilize available resources and services will improve Outcome: Adequate for Discharge Goal: Ability to manage health-related needs will improve Outcome: Adequate for Discharge   Problem: Metabolic: Goal: Ability to maintain appropriate glucose levels will improve Outcome: Adequate for Discharge   Problem: Nutritional: Goal: Maintenance of adequate nutrition will improve Outcome: Adequate for Discharge Goal: Progress toward achieving an optimal weight will improve Outcome: Adequate for Discharge   Problem: Skin Integrity: Goal: Risk for impaired skin integrity will decrease Outcome: Adequate for Discharge   Problem: Tissue Perfusion: Goal: Adequacy of tissue perfusion will improve Outcome: Adequate for Discharge   Problem: Education: Goal: Knowledge of General Education information will improve Description: Including pain rating scale, medication(s)/side effects and non-pharmacologic comfort measures Outcome: Adequate for Discharge   Problem: Health Behavior/Discharge Planning: Goal: Ability to manage health-related needs will improve Outcome: Adequate for Discharge   Problem: Clinical  Measurements: Goal: Ability to maintain clinical measurements within normal limits will improve Outcome: Adequate for Discharge Goal: Will remain free from infection Outcome: Adequate for Discharge Goal: Diagnostic test results will improve Outcome: Adequate for Discharge Goal: Respiratory complications will improve Outcome: Adequate for Discharge Goal: Cardiovascular complication will be avoided Outcome: Adequate for Discharge   Problem: Activity: Goal: Risk for activity intolerance will decrease Outcome: Adequate for Discharge   Problem: Nutrition: Goal: Adequate nutrition will be maintained Outcome: Adequate for Discharge   Problem: Coping: Goal: Level of anxiety will decrease Outcome: Adequate for Discharge   Problem: Elimination: Goal: Will not experience complications related to bowel motility Outcome: Adequate for Discharge Goal: Will not experience complications related to urinary retention Outcome: Adequate for Discharge   Problem: Pain Managment: Goal: General experience of comfort will improve and/or be controlled Outcome: Adequate for Discharge   Problem: Safety: Goal: Ability to remain free from injury will improve Outcome: Adequate for Discharge   Problem: Skin Integrity: Goal: Risk for impaired skin integrity will decrease Outcome: Adequate for Discharge

## 2024-03-06 NOTE — Plan of Care (Signed)
  Problem: Education: Goal: Ability to describe self-care measures that may prevent or decrease complications (Diabetes Survival Skills Education) will improve Outcome: Progressing Goal: Individualized Educational Video(s) Outcome: Progressing   Problem: Coping: Goal: Ability to adjust to condition or change in health will improve Outcome: Progressing   Problem: Fluid Volume: Goal: Ability to maintain a balanced intake and output will improve Outcome: Progressing   Problem: Health Behavior/Discharge Planning: Goal: Ability to identify and utilize available resources and services will improve Outcome: Progressing Goal: Ability to manage health-related needs will improve Outcome: Progressing   Problem: Metabolic: Goal: Ability to maintain appropriate glucose levels will improve Outcome: Progressing   Problem: Nutritional: Goal: Maintenance of adequate nutrition will improve Outcome: Progressing Goal: Progress toward achieving an optimal weight will improve Outcome: Progressing   Problem: Skin Integrity: Goal: Risk for impaired skin integrity will decrease Outcome: Progressing   Problem: Tissue Perfusion: Goal: Adequacy of tissue perfusion will improve Outcome: Progressing   Problem: Education: Goal: Knowledge of General Education information will improve Description: Including pain rating scale, medication(s)/side effects and non-pharmacologic comfort measures Outcome: Progressing   Problem: Health Behavior/Discharge Planning: Goal: Ability to manage health-related needs will improve Outcome: Progressing   Problem: Clinical Measurements: Goal: Ability to maintain clinical measurements within normal limits will improve Outcome: Progressing Goal: Will remain free from infection Outcome: Progressing Goal: Diagnostic test results will improve Outcome: Progressing Goal: Respiratory complications will improve Outcome: Progressing   Problem: Nutrition: Goal: Adequate  nutrition will be maintained Outcome: Progressing   Problem: Elimination: Goal: Will not experience complications related to bowel motility Outcome: Progressing

## 2024-03-07 DIAGNOSIS — R531 Weakness: Secondary | ICD-10-CM | POA: Diagnosis not present

## 2024-03-07 LAB — BASIC METABOLIC PANEL WITH GFR
Anion gap: 10 (ref 5–15)
BUN: 21 mg/dL (ref 8–23)
CO2: 25 mmol/L (ref 22–32)
Calcium: 9.5 mg/dL (ref 8.9–10.3)
Chloride: 103 mmol/L (ref 98–111)
Creatinine, Ser: 0.86 mg/dL (ref 0.61–1.24)
GFR, Estimated: >60 mL/min (ref >60)
Glucose, Bld: 142 mg/dL — ABNORMAL HIGH (ref 70–99)
Potassium: 4.3 mmol/L (ref 3.5–5.1)
Sodium: 138 mmol/L (ref 135–145)

## 2024-03-07 LAB — CBC
HCT: 48.8 % (ref 39.0–52.0)
Hemoglobin: 16.7 g/dL (ref 13.0–17.0)
MCH: 30.8 pg (ref 26.0–34.0)
MCHC: 34.2 g/dL (ref 30.0–36.0)
MCV: 90 fL (ref 80.0–100.0)
Platelets: 249 10*3/uL (ref 150–400)
RBC: 5.42 MIL/uL (ref 4.22–5.81)
RDW: 13.9 % (ref 11.5–15.5)
WBC: 8.6 10*3/uL (ref 4.0–10.5)
nRBC: 0 % (ref 0.0–0.2)

## 2024-03-07 LAB — GLUCOSE, CAPILLARY
Glucose-Capillary: 160 mg/dL — ABNORMAL HIGH (ref 70–99)
Glucose-Capillary: 142 mg/dL — ABNORMAL HIGH (ref 70–99)
Glucose-Capillary: 178 mg/dL — ABNORMAL HIGH (ref 70–99)
Glucose-Capillary: 185 mg/dL — ABNORMAL HIGH (ref 70–99)

## 2024-03-07 MED ORDER — LISINOPRIL 5 MG PO TABS
2.5000 mg | ORAL_TABLET | Freq: Every day | ORAL | Status: DC
  Administered 2024-03-08: 2.5 mg via ORAL
  Filled 2024-03-07: qty 1

## 2024-03-07 MED ORDER — METOPROLOL TARTRATE 25 MG PO TABS
25.0000 mg | ORAL_TABLET | Freq: Two times a day (BID) | ORAL | Status: DC
  Administered 2024-03-07 – 2024-03-08 (×2): 25 mg via ORAL
  Filled 2024-03-07 (×2): qty 1

## 2024-03-07 NOTE — Plan of Care (Signed)
  Problem: Coping: Goal: Ability to adjust to condition or change in health will improve Outcome: Progressing   Problem: Health Behavior/Discharge Planning: Goal: Ability to identify and utilize available resources and services will improve Outcome: Progressing Goal: Ability to manage health-related needs will improve Outcome: Progressing   Problem: Metabolic: Goal: Ability to maintain appropriate glucose levels will improve Outcome: Progressing   Problem: Nutritional: Goal: Maintenance of adequate nutrition will improve Outcome: Progressing Goal: Progress toward achieving an optimal weight will improve Outcome: Progressing   Problem: Skin Integrity: Goal: Risk for impaired skin integrity will decrease Outcome: Progressing   Problem: Tissue Perfusion: Goal: Adequacy of tissue perfusion will improve Outcome: Progressing   Problem: Clinical Measurements: Goal: Ability to maintain clinical measurements within normal limits will improve Outcome: Progressing Goal: Will remain free from infection Outcome: Progressing Goal: Diagnostic test results will improve Outcome: Progressing   Problem: Activity: Goal: Risk for activity intolerance will decrease Outcome: Progressing   Problem: Nutrition: Goal: Adequate nutrition will be maintained Outcome: Progressing   Problem: Coping: Goal: Level of anxiety will decrease Outcome: Progressing

## 2024-03-07 NOTE — Evaluation (Addendum)
 Clinical/Bedside Swallow Evaluation Patient Details  Name: Sean Hayden MRN: [id number] Date of Birth: 04-13-1946  Today's Date: 03/07/2024 Time: SLP Start Time (ACUTE ONLY): 1230 SLP Stop Time (ACUTE ONLY): 1330 SLP Time Calculation (min) (ACUTE ONLY): 60 min  Past Medical History:  Past Medical History:  Diagnosis Date   Aortic stenosis    a.) TTE 02/22/2016: moderate - severe AS (MPG 17); b.) s/p TAVR 09/09/2022: 26 mm Edwards Sapien 3 Ultra Resilia   Asthma    B12 deficiency    Carotid stenosis, bilateral    a.) carotid doppler 03/10/2012: 75-95% RICA, < 50 LICA; b.) s/p RIGHT CEA 04/17/2012 with CorMatrix patch angioplasty   Cerebral infarction [facility]) 01/03/2023   Mar 28, 2016 Entered By: Layla Barter GRACE Comment: March 2017. treated at [facility].Mar 28, 2016 Entered By: Layla Barter GRACE Comment: atrial fibrillation on holter   Coronary artery disease    Gout    Heart murmur    Hemorrhoids    HFrEF (heart failure with reduced ejection fraction) (HCC)    a.) TTE 02/22/2016: EF 60-65%, mild LV dil, mod RA dil, mod-sev AS, G1DD; b.) TTE 09/10/2022: EF 40%, glob HK, sep AK, mild BAE, mild TR, mod MR, pros AoV (MPG 3)   History of kidney stones    Hyperlipidemia    Hyperplastic colon polyp    Hypertension    Insomnia    Ischemic cerebrovascular accident (CVA) (HCC) 02/21/2016   a.) presented with LEFT sided weakness; b.) MRI brain 02/21/2016: acute infarct in the right medial parietal lobe measuring 5 mm   Long term current use of anticoagulant    a.) rivaroxaban   Neuropathy    Nonischemic congestive cardiomyopathy (HCC)    a.) TTE 02/22/2016: EF 60-65%; b.) TTE 09/10/2022: EF 40%   Nonrheumatic aortic (valve) stenosis 08/23/2022   S/p TAVR Edwards biosprosthetic valve   Nose colonized with MRSA 02/21/2016   a.) surgical PCR (+) 02/21/2016 prior to ILR implant   NSVT (nonsustained ventricular tachycardia) (HCC)    a) holter 09/10/2022: longest episode 23  beats, and fastest episode 164 bpm   PAF (paroxysmal atrial fibrillation) (HCC)    a.) CHA2DS2VASc = 8 (age x2, HFrEF, HTN, CVAx2, vascular disease history, T2DM);  b.) s/p ILR implant 02/23/2016; c.) rate/rhythm maintained on oral metoprolol tartrate; chronically anticoagulated with rivaroxaban   Pneumonia    S/P TAVR (transcatheter aortic valve replacement) 09/09/2022   a.) 26 mm Edwards Sapien 3 Ultra Resilia   Seizures (HCC)    Skin cancer of face    T2DM (type 2 diabetes mellitus) (HCC)    Tongue neoplasm    Past Surgical History:  Past Surgical History:  Procedure Laterality Date   Arm fracture Left    CAROTID ARTERY ANGIOPLASTY Right 04/17/2012   Procedure: CAROTID ARTERY ANGIOPLASTY; Location: [facility]; Surgeon: Levora Dredge, MD   CAROTID ENDARTERECTOMY Right 04/17/2012   Procedure: CAROTID ENDARTARECTOMY; Location: [facility]; Surgeon: Levora Dredge, MD   CHOLECYSTECTOMY, LAPAROSCOPIC  2005   COLONOSCOPY     CYST EXCISION     "between legs"   EP IMPLANTABLE DEVICE N/A 02/23/2016   Procedure: Loop Recorder Insertion;  Surgeon: Hillis Range, MD;  Location: [facility] INVASIVE CV LAB;  Service: Cardiovascular;  Laterality: N/A;   HEMORRHOID SURGERY  2009   INCISION AND DRAINAGE WOUND WITH NERVE AND ARTERY REPAIR Left 10/04/2017   Procedure: LEFT HAND WOUND EXPLORATION repair as necessary;  Surgeon: Bradly Bienenstock, MD;  Location: [facility] OR;  Service: Orthopedics;  Laterality: Left;  MOHS SURGERY Right 12/24/2022   - Katrine Coho, MD   TEE WITHOUT CARDIOVERSION N/A 02/23/2016   Procedure: TRANSESOPHAGEAL ECHOCARDIOGRAM (TEE);  Surgeon: Chrystie Nose, MD;  Location: [facility] ENDOSCOPY;  Service: Cardiovascular;  Laterality: N/A;  ALSO GETTING A LOOP   TRANSCATHETER AORTIC VALVE REPLACEMENT, TRANSFEMORAL  09/09/2022   Procedure: TRANSCATHETER AORTIC VALVE REPLACEMENT, TRANSFEMORAL; Location: [facility]; Surgeon: Nance Pew, MD   HPI:  Pt is a 78 y/o male presented to ED on 03/01/24 following a fall.  Admitted with Afib with RVR. PMH: aortic stenosis, carotid stenosis, gout, asthma, T2DM, oral cancer status post partial Left glossectomy, HTN, HFrEF, seizure disorder, , s/p TAVR. Neck CT, 02/09/24, "History of tongue cancer with marked progression from 04/04/2023 PET  CT. There is a nearly 6 cm mass involving the left floor of mouth  and mandible with submental and low left jugular chain adenopathy.  The left jugular chain node measures over 4 cm and shows signs of  extranodal extension.New 15 mm nodule in the left upper lobe, suspect interval pulmonary metastasis.".   CXR, 03/01/24, "1. Pulmonary venous congestion.  2. Bilateral trace pleural effusions.".   Head CT: chronic posterior left MCA and/or ACA  territory cortical encephalomalacia.  Advanced chronic ischemic disease.  Suspect a degree of Cognitive decline per Imaging above.    Assessment / Plan / Recommendation  Clinical Impression   Pt seen for BSE per request of NSG/MD d/t decline in pt's functional swallowing presentation this morning. Pt alert, pleasant, and cooperative. Dysarthria appreciated with articulatory imprecision and hoarse/hypophonic vocal quality at Baseline. Quite talkative. Suspect degree of Cognitive decline per his presentation, his Head Imaging, and NSG report of night-time activity/getting out of bed and Confusion. Pt has DECLINED Safety Awareness which impacts general safety due to Cognitive presentation and impulsivity -- this is noted by other Rehab disciplines.  On RA, afebrile. WBC WNL.   Pt appears to present w/ oral phase Dysphagia likely secondary to pt's Left partial glossectomy w/ decreased awareness of residual deficits; however, he presents w/ functional pharyngeal phase swallowing w/ No pharyngeal phase dysphagia noted. Pt consumed po trials w/ No overt, clinical s/s of aspiration during po trials.  Pt appears at reduced risk for aspiration when following general aspiration precautions, using a modified Dysphagia  diet, and when given Supervision for follow through w/ strategies to Clear Oral Cavity b/t bites/post meals. Pt does have challenging factors that could impact oropharyngeal swallowing to include h/o Left partial glossectomy, suspected Cognitive decline and insight/awareness of self/situation during oral intake(see Imaging), deconditioning/weakness, and lengthy hospitalization. These factors can increase risk for aspiration, dysphagia as well as decreased oral intake overall.   During po trials, pt consumed all consistencies w/ no overt coughing, decline in vocal quality, or change in respiratory presentation during/post trials. O2 sats 98% when checked. Oral phase deficits c/b prolonged bolus management and manipulation/mastication/control of bolus propulsion for A-P transfer for swallowing w/ resulting oral residue(min+) typically on Left side buccal area. Pt exhibited REDUCED awareness of the Left oral residue and did not make immediate attempts to clear it -- this was noted by OT during a morning session today. W/ MOD+ cues, alternating foods and liquids, and continued checking to see the oral clearing, pt was able to functionally achieve oral clearing w/ all trial consistencies. Moistening the MINCED foods and alternating w/ purees then liquids seemed to work best. Pt exhibited lingual sweeping independently but was CUED to do more to continually check for clearing of the Left  buccal area.  Pt fed self w/ setup support.   Recommend continue a MINCED foods consistency diet w/ well-moistened foods; Thin liquids -- carefully monitor straw use, and pt should help to Hold Cup when drinking. Recommend general aspiration precautions, 100% Supervision at meals to follow strategies for Oral Clearing of mouth. Reduce distractions and talking during oral intake. Pills CRUSHED in Puree for safer, easier swallowing now and for D/C to the NSG.  Education given on Pills in Puree; food consistencies and oral clearing  strategies; general aspiration precautions to pt and NSG. STRONGLY suspect pt will need the same 100% Supervision at meals at Discharge.  NSG to reconsult if any new needs arise. NSG updated, agreed. MD updated. Precautions posted in room, chart. Recommend Dietician f/u for support. SLP Visit Diagnosis: Dysphagia, oral phase (R13.11) (suspect Cognitive decline impact)    Aspiration Risk  Mild aspiration risk;Risk for inadequate nutrition/hydration (reduced w/ precautions, supervision)    Diet Recommendation   Thin;Dysphagia 2 (chopped) = continue a MINCED foods consistency diet w/ well-moistened foods; Thin liquids -- carefully monitor straw use, and pt should help to Hold Cup when drinking. Recommend general aspiration precautions, 100% Supervision at meals to follow strategies for Oral Clearing of mouth. Reduce distractions and talking during oral intake.   Medication Administration: Crushed with puree    Other  Recommendations Recommended Consults:  (Dietician; Palliative Care for GOC; Neurology for formal Cognitive assessment) Oral Care Recommendations: Oral care BID;Oral care before and after PO;Staff/trained caregiver to provide oral care    Recommendations for follow up therapy are one component of a multi-disciplinary discharge planning process, led by the attending physician.  Recommendations may be updated based on patient status, additional functional criteria and insurance authorization.  Follow up Recommendations Follow physician's recommendations for discharge plan and follow up therapies (next venue of care if needs indicate)      Assistance Recommended at Discharge  FULL at meals  Functional Status Assessment Patient has not had a recent decline in their functional status  Frequency and Duration  (n/a)   (n/a)       Prognosis Prognosis for improved oropharyngeal function: Fair Barriers to Reach Goals: Cognitive deficits;Time post onset;Severity of  deficits;Behavior Barriers/Prognosis Comment: Left partial glossectomy; suspect Cognitive decline impact      Swallow Study   General Date of Onset: 03/01/24 HPI: Pt is a 78 y/o male presented to ED on 03/01/24 following a fall. Admitted with Afib with RVR. PMH: aortic stenosis, carotid stenosis, gout, asthma, T2DM, oral cancer status post partial Left glossectomy, HTN, HFrEF, seizure disorder, , s/p TAVR. Neck CT, 02/09/24, "History of tongue cancer with marked progression from 04/04/2023 PET  CT. There is a nearly 6 cm mass involving the left floor of mouth  and mandible with submental and low left jugular chain adenopathy.  The left jugular chain node measures over 4 cm and shows signs of  extranodal extension.New 15 mm nodule in the left upper lobe, suspect interval pulmonary metastasis.".   CXR, 03/01/24, "1. Pulmonary venous congestion.  2. Bilateral trace pleural effusions.".   Head CT: chronic posterior left MCA and/or ACA  territory cortical encephalomalacia.  Advanced chronic ischemic disease.  Suspect a degree of Cognitive decline per Imaging above. Type of Study: Bedside Swallow Evaluation Previous Swallow Assessment: 03/02/24 Diet Prior to this Study: Dysphagia 2 (finely chopped);Thin liquids (Level 0) Temperature Spikes Noted: No (wbc 8.6) Respiratory Status: Room air History of Recent Intubation: No Behavior/Cognition: Alert;Cooperative;Pleasant mood;Confused;Distractible;Requires cueing (speech c/b jargon  at times) Oral Cavity Assessment:  (food residue in Left buccal area) Oral Care Completed by SLP: Yes Oral Cavity - Dentition: Missing dentition (upper partial) Vision: Functional for self-feeding Self-Feeding Abilities: Able to feed self;Needs assist;Needs set up (for follow through) Patient Positioning: Upright in bed (MOD+ assist) Baseline Vocal Quality: Hoarse;Low vocal intensity (Gravely) Volitional Cough: Cognitively unable to elicit (distracted)    Oral/Motor/Sensory  Function Overall Oral Motor/Sensory Function: Mild impairment (-Moderate w/ decreased sensatoin d/t Left lateral partial glossectomy)   Ice Chips Ice chips: Not tested   Thin Liquid Thin Liquid: Within functional limits Presentation: Self Fed;Straw (~8-10 ozs) Other Comments: cues to slow down; small sips    Nectar Thick Nectar Thick Liquid: Not tested   Honey Thick Honey Thick Liquid: Not tested   Puree Puree: Within functional limits Presentation: Spoon;Self Fed (10+ trials)   Solid     Solid: Impaired Presentation: Self Fed;Spoon (10+ trials) Oral Phase Impairments: Impaired mastication;Reduced lingual movement/coordination;Poor awareness of bolus Oral Phase Functional Implications: Impaired mastication;Oral residue;Left lateral sulci pocketing Pharyngeal Phase Impairments:  (none)        Jerilynn Som, MS, CCC-SLP Speech Language Pathologist Rehab Services; [facility] [phone] (ascom) Unice Vantassel 03/07/2024,5:52 PM

## 2024-03-07 NOTE — Progress Notes (Signed)
 Progress Note   Patient: Sean Hayden:[id number] DOB: [birth year] DOA: 03/01/2024     1 DOS: the patient was seen and examined on 03/07/2024   Brief hospital course: Sean Hayden is a 78 y.o. male with medical history significant for aortic stenosis, carotid stenosis, gout, asthma, vitamin B12 deficiency, oral cancer status post partial glossectomy hypertension, dyslipidemia, HFrEF, seizure disorder, type 2 diabetes mellitus, s/p TAVR, who presented to the emergency room with acute onset of generalized weakness and feeling of his "legs give out". His electrolytes abnormal, has Afib with RVR admitted to [facility] service.  During hospital stay, his HR better controlled, electrolytes replaced accordingly. PT/ OT advised SNF placement. He was confused yesterday, got Ativan.The patient is awake and alert today.  Heart rate is well-controlled.   On the morning of 03/07/2024, the patient's orientation waxes and wanes through the day. He is frequently more confused in the late afternoon and evening. The patient is oriented to self, place, Year, Month, but not specific day or date. He does not know why he is admitted. He has been taken off restraints/mittens and will monitor how he does. It appears that the patient may be discharged to [facility] on Monday.   Assessment and Plan: * Generalized weakness Fall without injuries. Continue to monitor vitals closely. PT/ OT advised placement. His HR better. Acute metabolic encephalopathy CT head was obtained on 03/04/2024 that demonstrated stable appearance of advanced chronic ischemic disease. Will stop xanax. Delirium precautions. Continue neurochecks. Fall, aspiration precautions. Today appears to be alert answers questions appropriately. Calm so far today.   Hypomagnesemia Resolved.  Paroxysmal atrial fibrillation with RVR (HCC) HR has ranged betweem 72 and 118 in the last 24 hours.  Continue telemetry. Diltiazem has been discontinued. The  patient's heart rate is controlled on metoprolol tartrate 12.5 mg bid.  Will increase metoprolol and decrease lisinopril to 2.5 to allow for increase in dose of metoprolol.  He is on xarelto for stroke prophylaxis.   Dyslipidemia Continue statin therapy.  Type 2 diabetes mellitus with peripheral neuropathy (HCC) Hypoglycemia Decreased long acting insulin to 30units daily with sliding scale. Hold metformin. Continue Neurontin. Hypoglycemia protocol. Glucoses have run between 95 and 252 in the last 24 hours.   Seizure disorder (HCC) Continue Keppra.  Depression Continue Paxil.  Essential hypertension Continue antihypertensive therapy.  Obesity class 1 BMI 31.92 Diet, exercise and weight reduction advised.    Tele sitter has been discontinued as the patient appears calm and cooperative.  Out of bed to chair. Incentive spirometry. Nursing supportive care. Fall, aspiration precautions. Diet:  Diet Orders (From admission, onward)     Start     Ordered   03/07/24 1401  DIET DYS 2 Room service appropriate? No; Fluid consistency: Thin  Diet effective now       Comments: Extra Gravy on meats, potatoes. Added Purees on trays.  Question Answer Comment  Room service appropriate? No   Fluid consistency: Thin      03/07/24 1402           DVT prophylaxis:  rivaroxaban (XARELTO) tablet 20 mg  Level of care: tele/ medical   Code Status: Full Code  Subjective: Patient is sitting up in bed comfortably. No new complaints.   Physical Exam: Vitals:   03/07/24 0500 03/07/24 0744 03/07/24 0830 03/07/24 1100  BP:  (!) 127/95  105/83  Pulse:  (!) 102 (!) 115 86  Resp:   15   Temp:  (!) 97.5 F (36.4  C)  97.8 F (36.6 C)  TempSrc:  Axillary  Oral  SpO2:  94%  93%  Weight: 88.9 kg     Height:        Exam:  Constitutional:  The patient is awake, alert, and oriented x 2. No acute distress. Respiratory:  No increased work of breathing. No wheezes, rales, or rhonchi No  tactile fremitus Cardiovascular:  Regular rate and rhythm No murmurs, ectopy, or gallups. No lateral PMI. No thrills. Abdomen:  Abdomen is soft, non-tender, non-distended No hernias, masses, or organomegaly Normoactive bowel sounds.  Musculoskeletal:  No cyanosis, clubbing, or edema Skin:  No rashes, lesions, ulcers palpation of skin: no induration or nodules Neurologic:  CN 2-12 intact Sensation all 4 extremities intact Psychiatric:  Mental status Mood, affect appropriate Orientation to person, place, time  judgment and insight appear intact .  Data Reviewed:      Latest Ref Rng & Units 03/07/2024    4:30 AM 03/06/2024    8:57 AM 03/01/2024    6:10 AM  CBC  WBC 4.0 - 10.5 K/uL 8.6  7.7  5.9   Hemoglobin 13.0 - 17.0 g/dL 16.1  09.6  04.5   Hematocrit 39.0 - 52.0 % 48.8  46.7  46.0   Platelets 150 - 400 K/uL 249  208  175       Latest Ref Rng & Units 03/07/2024    4:30 AM 03/06/2024    8:57 AM 03/01/2024    6:10 AM  BMP  Glucose 70 - 99 mg/dL 409  811  914   BUN 8 - 23 mg/dL 21  20  11    Creatinine 0.61 - 1.24 mg/dL 7.82  9.56  2.13   Sodium 135 - 145 mmol/L 138  138  139   Potassium 3.5 - 5.1 mmol/L 4.3  4.6  4.6   Chloride 98 - 111 mmol/L 103  101  105   CO2 22 - 32 mmol/L 25  28  28    Calcium 8.9 - 10.3 mg/dL 9.5  9.5  9.0    No results found.    Family Communication: Discussed with patient's son yesterday. He  understand and agree. All questions answered.  Disposition: Status is: Observation The patient remains OBS appropriate and will d/c before 2 midnights.  Planned Discharge Destination: Skilled nursing facility     Time spent: 34 minutes  Author: Kenyatte Chatmon, DO 03/07/2024 2:53 PM Secure chat 7am to 7pm For on call review [email or web address].

## 2024-03-07 NOTE — Plan of Care (Signed)
  Problem: Metabolic: Goal: Ability to maintain appropriate glucose levels will improve Outcome: Progressing   Problem: Nutritional: Goal: Maintenance of adequate nutrition will improve Outcome: Progressing   Problem: Nutrition: Goal: Adequate nutrition will be maintained Outcome: Progressing   Problem: Pain Managment: Goal: General experience of comfort will improve and/or be controlled Outcome: Progressing   Problem: Education: Goal: Ability to describe self-care measures that may prevent or decrease complications (Diabetes Survival Skills Education) will improve Outcome: Not Progressing Goal: Individualized Educational Video(s) Outcome: Not Progressing   Problem: Coping: Goal: Ability to adjust to condition or change in health will improve Outcome: Not Progressing   Problem: Fluid Volume: Goal: Ability to maintain a balanced intake and output will improve Outcome: Not Progressing   Problem: Health Behavior/Discharge Planning: Goal: Ability to manage health-related needs will improve Outcome: Not Progressing   Problem: Nutritional: Goal: Progress toward achieving an optimal weight will improve Outcome: Not Progressing   Problem: Skin Integrity: Goal: Risk for impaired skin integrity will decrease Outcome: Not Progressing   Problem: Tissue Perfusion: Goal: Adequacy of tissue perfusion will improve Outcome: Not Progressing   Problem: Education: Goal: Knowledge of General Education information will improve Description: Including pain rating scale, medication(s)/side effects and non-pharmacologic comfort measures Outcome: Not Progressing   Problem: Health Behavior/Discharge Planning: Goal: Ability to manage health-related needs will improve Outcome: Not Progressing   Problem: Clinical Measurements: Goal: Ability to maintain clinical measurements within normal limits will improve Outcome: Not Progressing Goal: Will remain free from infection Outcome: Not  Progressing Goal: Diagnostic test results will improve Outcome: Not Progressing Goal: Respiratory complications will improve Outcome: Not Progressing Goal: Cardiovascular complication will be avoided Outcome: Not Progressing   Problem: Activity: Goal: Risk for activity intolerance will decrease Outcome: Not Progressing   Problem: Coping: Goal: Level of anxiety will decrease Outcome: Not Progressing   Problem: Elimination: Goal: Will not experience complications related to bowel motility Outcome: Not Progressing Goal: Will not experience complications related to urinary retention Outcome: Not Progressing   Problem: Safety: Goal: Ability to remain free from injury will improve Outcome: Not Progressing   Problem: Skin Integrity: Goal: Risk for impaired skin integrity will decrease Outcome: Not Progressing

## 2024-03-07 NOTE — Progress Notes (Signed)
 Physical Therapy Treatment Patient Details Name: Sean Hayden MRN: [id number] DOB: [birth year] Today's Date: 03/07/2024   History of Present Illness 78 y/o male presented to ED on 03/01/24 following a fall. Admitted with Afib with RVR. PMH: aortic stenosis, carotid stenosis, gout, asthma, T2DM, oral cancer status post partial glossectomy, HTN, HFrEF, seizure disorder, , s/p TAVR    PT Comments  Pt in bed.  Noted to have eggs in his mouth and difficulty clearing.  Given water and milk but still has trouble clearing and some coughing noted.  Reached out to team to make them aware.  He is able to get to EOB with rails and increased time. Steady in sitting.  He stands to walker with cga x 1 and after weight shifting transfers to recliner.  +2 is called for general safety due to cognition and impulsivity.  He stands and does complete x 1 lap on unit with RW and cga/min a x 1 for guidance and recliner follow.  Occasionally stopping in hallway and needing cues to keep RW.  Upon return to room leaves RW to the side and uses bed support to assist back to supine.  Pt opts to return to bed and for general safety is returned with mits replaced after session and safety precautions in place.     If plan is discharge home, recommend the following: A little help with walking and/or transfers;A little help with bathing/dressing/bathroom;Assistance with cooking/housework;Direct supervision/assist for financial management;Direct supervision/assist for medications management;Assist for transportation;Help with stairs or ramp for entrance   Can travel by private vehicle        Equipment Recommendations  Rolling walker (2 wheels)    Recommendations for Other Services       Precautions / Restrictions Precautions Precautions: Fall     Mobility  Bed Mobility Overal bed mobility: Needs Assistance Bed Mobility: Supine to Sit, Sit to Supine     Supine to sit: Contact guard Sit to supine: Contact guard  assist     Patient Response: Cooperative, Impulsive  Transfers Overall transfer level: Needs assistance Equipment used: Rolling walker (2 wheels) Transfers: Sit to/from Stand Sit to Stand: Contact guard assist                Ambulation/Gait Ambulation/Gait assistance: Min assist Gait Distance (Feet): 200 Feet Assistive device: Rolling walker (2 wheels) Gait Pattern/deviations: Step-through pattern, Decreased stride length, Trunk flexed Gait velocity: decreased     General Gait Details: assist for RW management and balance due to impulsivity   Stairs             Wheelchair Mobility     Tilt Bed Tilt Bed Patient Response: Cooperative, Impulsive  Modified Rankin (Stroke Patients Only)       Balance Overall balance assessment: Needs assistance, History of Falls Sitting-balance support: No upper extremity supported, Feet supported Sitting balance-Leahy Scale: Fair     Standing balance support: Bilateral upper extremity supported, Reliant on assistive device for balance Standing balance-Leahy Scale: Fair                              Communication Communication Communication: Impaired Factors Affecting Communication: Hearing impaired;Difficulty expressing self  Cognition Arousal: Alert Behavior During Therapy: Impulsive   PT - Cognitive impairments: No family/caregiver present to determine baseline                       PT - Cognition Comments: impulsive  and follows commands with increased time Following commands: Impaired Following commands impaired: Follows one step commands with increased time, Follows one step commands inconsistently    Cueing Cueing Techniques: Verbal cues, Gestural cues, Tactile cues, Visual cues  Exercises      General Comments        Pertinent Vitals/Pain Pain Assessment Pain Assessment: No/denies pain    Home Living                          Prior Function            PT Goals  (current goals can now be found in the care plan section) Progress towards PT goals: Progressing toward goals    Frequency    Min 3X/week      PT Plan      Co-evaluation              AM-PAC PT "6 Clicks" Mobility   Outcome Measure  Help needed turning from your back to your side while in a flat bed without using bedrails?: A Little Help needed moving from lying on your back to sitting on the side of a flat bed without using bedrails?: A Little Help needed moving to and from a bed to a chair (including a wheelchair)?: A Little Help needed standing up from a chair using your arms (e.g., wheelchair or bedside chair)?: A Little Help needed to walk in hospital room?: A Little Help needed climbing 3-5 steps with a railing? : A Lot 6 Click Score: 17    End of Session Equipment Utilized During Treatment: Gait belt Activity Tolerance: Patient tolerated treatment well Patient left: in bed;with call bell/phone within reach;with bed alarm set Nurse Communication: Mobility status PT Visit Diagnosis: Unsteadiness on feet (R26.81);Muscle weakness (generalized) (M62.81);Difficulty in walking, not elsewhere classified (R26.2);History of falling (Z91.81)     Time: 0981-1914 PT Time Calculation (min) (ACUTE ONLY): 22 min  Charges:    $Gait Training: 8-22 mins PT General Charges $$ ACUTE PT VISIT: 1 Visit                    Danielle Dess, PTA 03/07/24, 10:39 AM

## 2024-03-08 DIAGNOSIS — R531 Weakness: Secondary | ICD-10-CM | POA: Diagnosis not present

## 2024-03-08 LAB — GLUCOSE, CAPILLARY
Glucose-Capillary: 168 mg/dL — ABNORMAL HIGH (ref 70–99)
Glucose-Capillary: 128 mg/dL — ABNORMAL HIGH (ref 70–99)

## 2024-03-08 MED ORDER — INSULIN GLARGINE-YFGN 100 UNIT/ML ~~LOC~~ SOLN: 42.0000 [IU] | Freq: Every day | SUBCUTANEOUS | 11 refills | Status: DC

## 2024-03-08 MED ORDER — METOPROLOL TARTRATE 25 MG PO TABS: 25.0000 mg | ORAL_TABLET | Freq: Two times a day (BID) | ORAL | 0 refills | Status: AC

## 2024-03-08 MED ORDER — EMPAGLIFLOZIN 25 MG PO TABS: 25.0000 mg | ORAL_TABLET | Freq: Every day | ORAL | 0 refills | Status: AC

## 2024-03-08 MED ORDER — INSULIN DETEMIR 100 UNIT/ML ~~LOC~~ SOLN: 42.0000 [IU] | Freq: Every day | SUBCUTANEOUS | 1 refills | Status: DC

## 2024-03-08 MED ORDER — LISINOPRIL 2.5 MG PO TABS: 2.5000 mg | ORAL_TABLET | Freq: Every day | ORAL | 0 refills | Status: AC

## 2024-03-08 MED ORDER — OMEGA-3-ACID ETHYL ESTERS 1 G PO CAPS
1.0000 g | ORAL_CAPSULE | Freq: Two times a day (BID) | ORAL | 0 refills | Status: AC
Start: 2024-03-08 — End: ?

## 2024-03-08 MED ORDER — ATORVASTATIN CALCIUM 40 MG PO TABS: 40.0000 mg | ORAL_TABLET | Freq: Every evening | ORAL | 0 refills | Status: AC

## 2024-03-08 MED ORDER — VITAMIN D3 25 MCG PO TABS: 2000.0000 [IU] | ORAL_TABLET | Freq: Every day | ORAL | 0 refills | Status: AC

## 2024-03-08 MED ORDER — ADULT MULTIVITAMIN W/MINERALS CH: 1.0000 | ORAL_TABLET | Freq: Every day | ORAL | 0 refills | Status: AC

## 2024-03-08 MED ORDER — INSULIN GLARGINE 100 UNIT/ML SOLOSTAR PEN: 42.0000 [IU] | PEN_INJECTOR | Freq: Every day | SUBCUTANEOUS | 11 refills | Status: AC

## 2024-03-08 MED ORDER — TRAZODONE HCL 50 MG PO TABS: 25.0000 mg | ORAL_TABLET | Freq: Every evening | ORAL | 0 refills | Status: AC | PRN

## 2024-03-08 MED ORDER — ENSURE ENLIVE PO LIQD: 237.0000 mL | Freq: Three times a day (TID) | ORAL | 12 refills | Status: AC

## 2024-03-08 NOTE — Assessment & Plan Note (Signed)
 Noted. Patient needs PT/OT at SNF in order to be safe at home.

## 2024-03-08 NOTE — Plan of Care (Signed)
  Problem: Pain Managment: Goal: General experience of comfort will improve and/or be controlled Outcome: Progressing   Problem: Safety: Goal: Ability to remain free from injury will improve Outcome: Progressing   Problem: Fluid Volume: Goal: Ability to maintain a balanced intake and output will improve Outcome: Not Progressing   Problem: Education: Goal: Knowledge of General Education information will improve Description: Including pain rating scale, medication(s)/side effects and non-pharmacologic comfort measures Outcome: Not Progressing

## 2024-03-08 NOTE — Discharge Summary (Addendum)
 Physician Discharge Summary   Patient: Sean Hayden MRN: [id number] DOB: 01/08/1946  Admit date:     03/01/2024  Discharge date: 03/08/24  Discharge Physician: Fran Lowes   PCP: Malva Limes, MD   Recommendations at discharge:    Discharge to SNF PT/OT Follow up with PCP in 7-10 days  Discharge Diagnoses: Principal Problem:   Generalized weakness Active Problems:   Paroxysmal atrial fibrillation with RVR (HCC)   Hypomagnesemia   Essential hypertension   Depression   Seizure disorder (HCC)   Type 2 diabetes mellitus with peripheral neuropathy (HCC)   Dyslipidemia   Fall  Resolved Problems:   * No resolved hospital problems. *  Hospital Course: Sean Hayden is a 78 y.o. male with medical history significant for aortic stenosis, carotid stenosis, gout, asthma, vitamin B12 deficiency, oral cancer status post partial glossectomy hypertension, dyslipidemia, HFrEF, seizure disorder, type 2 diabetes mellitus, s/p TAVR, who presented to the emergency room with acute onset of generalized weakness and feeling of his "legs give out". His electrolytes abnormal, has Afib with RVR admitted to [facility] service.   During hospital stay, his HR better controlled, electrolytes replaced accordingly. PT/ OT advised SNF placement. He was confused yesterday, got Ativan.The patient is awake and alert today.  Heart rate is well-controlled.    On the morning of 03/07/2024, the patient's orientation waxes and wanes through the day. He is frequently more confused in the late afternoon and evening. The patient is oriented to self, place, Year, Month, but not specific day or date. He does not know why he is admitted. He has been taken off restraints/mittens and will monitor how he does. It appears that the patient may be discharged to [facility] on Monday.    On 03/08/2024 the patient is resting comfortably and is appropriate for discharge to [facility].  Assessment and Plan: * Generalized  weakness Plan is for rehab and [facility].  Hypomagnesemia Resolved.  Paroxysmal atrial fibrillation with RVR (HCC) The patient is on xarelto for stroke prophylaxis. His heart rate is controlled on metoprolol 25 mg bid.  Fall Noted. Patient needs PT/OT at SNF in order to be safe at home.  Dyslipidemia - We will continue statin therapy.  Type 2 diabetes mellitus with peripheral neuropathy (HCC) The patient will be discharged to home on: Lantus 42 units daily Jardiance 25 mg daily Metformin 850 mg bid  Seizure disorder (HCC) - We will continue continue Keppra.  Depression - We will continue Paxil.   Essential hypertension The patient is normotensive on discharge on metoprolol 25 mg bid and lisinopril 2.5 mg daily         Consultants: None Procedures performed: None  Disposition: Skilled nursing facility Diet recommendation:  Discharge Diet Orders (From admission, onward)     Start     Ordered   03/08/24 0000  Diet - low sodium heart healthy        03/08/24 0900           Cardiac and Carb modified diet DISCHARGE MEDICATION: Allergies as of 03/08/2024       Reactions   Pioglitazone    UNSPECIFIED REACTION         Medication List     STOP taking these medications    acyclovir 200 MG capsule Commonly known as: ZOVIRAX   acyclovir 400 MG tablet Commonly known as: ZOVIRAX   benzocaine 10 % mucosal gel Commonly known as: ORAJEL   Docosanol 10 % Crea  finasteride 5 MG tablet Commonly known as: PROSCAR   Fish Oil 1000 MG Cpdr Replaced by: omega-3 acid ethyl esters 1 g capsule   Flaxseed Oil 1000 MG Caps   fluconazole 100 MG tablet Commonly known as: DIFLUCAN   furosemide 20 MG tablet Commonly known as: LASIX   insulin detemir 100 UNIT/ML injection Commonly known as: Levemir   MISC NATURAL PRODUCTS PO   OSTEO BI-FLEX TRIPLE STRENGTH PO   sucralfate 1 g tablet Commonly known as: Carafate       TAKE these medications     acetaminophen 325 MG tablet Commonly known as: TYLENOL Take 650 mg by mouth 2 (two) times daily as needed for mild pain (pain score 1-3).   albuterol 108 (90 Base) MCG/ACT inhaler Commonly known as: VENTOLIN HFA Inhale 2 puffs into the lungs every 6 (six) hours as needed for wheezing or shortness of breath.   atorvastatin 40 MG tablet Commonly known as: LIPITOR Take 1 tablet (40 mg total) by mouth every evening. What changed:  medication strength how much to take   empagliflozin 25 MG Tabs tablet Commonly known as: Jardiance Take 1 tablet (25 mg total) by mouth daily. What changed: how much to take   feeding supplement Liqd Take 237 mLs by mouth 3 (three) times daily between meals.   fluticasone 50 MCG/ACT nasal spray Commonly known as: FLONASE Place 2 sprays into both nostrils daily. What changed:  how much to take when to take this reasons to take this   gabapentin 100 MG capsule Commonly known as: NEURONTIN Take 1 capsule (100 mg total) by mouth 2 (two) times daily.   insulin glargine 100 UNIT/ML Solostar Pen Commonly known as: LANTUS Inject 42 Units into the skin daily. What changed:  medication strength how much to take when to take this   levETIRAcetam 750 MG tablet Commonly known as: KEPPRA Take 1 tablet (750 mg total) by mouth 2 (two) times daily.   lisinopril 2.5 MG tablet Commonly known as: ZESTRIL Take 1 tablet (2.5 mg total) by mouth daily. What changed: how much to take   loratadine 10 MG tablet Commonly known as: CLARITIN Take 10 mg by mouth daily.   metFORMIN 850 MG tablet Commonly known as: GLUCOPHAGE Take 1 tablet (850 mg total) by mouth 2 (two) times daily with a meal.   metoprolol tartrate 25 MG tablet Commonly known as: LOPRESSOR Take 1 tablet (25 mg total) by mouth 2 (two) times daily. What changed:  how much to take when to take this   multivitamin with minerals Tabs tablet Take 1 tablet by mouth daily.   omega-3 acid  ethyl esters 1 g capsule Commonly known as: LOVAZA Take 1 capsule (1 g total) by mouth 2 (two) times daily. Replaces: Fish Oil 1000 MG Cpdr   PARoxetine 40 MG tablet Commonly known as: PAXIL Take 1 tablet (40 mg total) by mouth daily.   traZODone 50 MG tablet Commonly known as: DESYREL Take 0.5 tablets (25 mg total) by mouth at bedtime as needed for sleep.   vitamin D3 25 MCG tablet Commonly known as: CHOLECALCIFEROL Take 2 tablets (2,000 Units total) by mouth daily. What changed:  medication strength how much to take   Xarelto 20 MG Tabs tablet Generic drug: rivaroxaban TAKE ONE TABLET BY MOUTH DAILY WITH SUPPER        Contact information for after-discharge care     Destination     [facility] Preferred SNF .  Service: Skilled Nursing Contact information: [address] [place] [address] [phone]                    Discharge Exam: Ceasar Mons Weights   03/06/24 0536 03/07/24 0500 03/08/24 0500  Weight: 89.6 kg 88.9 kg 87.8 kg   Exam:  Constitutional:  The patient is awake, alert, and oriented x 3. No acute distress. Respiratory:  No increased work of breathing. No wheezes, rales, or rhonchi No tactile fremitus Cardiovascular:  Regular rate and rhythm No murmurs, ectopy, or gallups. No lateral PMI. No thrills. Abdomen:  Abdomen is soft, non-tender, non-distended No hernias, masses, or organomegaly Normoactive bowel sounds.  Musculoskeletal:  No cyanosis, clubbing, or edema Skin:  No rashes, lesions, ulcers palpation of skin: no induration or nodules Neurologic:  CN 2-12 intact Sensation all 4 extremities intact Psychiatric:  Mental status Mood, affect appropriate Orientation to person, place, time  judgment and insight appear intact   Condition at discharge: fair  The results of significant diagnostics from this hospitalization (including imaging, microbiology, ancillary and  laboratory) are listed below for reference.   Imaging Studies: CT HEAD WO CONTRAST ( ) Result Date: 03/04/2024 CLINICAL DATA:  78 year old male with altered mental status. EXAM: CT HEAD WITHOUT CONTRAST TECHNIQUE: Contiguous axial images were obtained from the base of the skull through the vertex without intravenous contrast. RADIATION DOSE REDUCTION: This exam was performed according to the departmental dose-optimization program which includes automated exposure control, adjustment of the mA and/or kV according to patient size and/or use of iterative reconstruction technique. COMPARISON:  Head CT 03/01/2024 and earlier. FINDINGS: Brain: Patchy and confluent bilateral cerebral white matter hypodensity with superimposed chronic posterior left MCA and/or ACA territory cortical encephalomalacia. No midline shift, ventriculomegaly, mass effect, evidence of mass lesion, intracranial hemorrhage or evidence of cortically based acute infarction. Vascular: Extensive Calcified atherosclerosis at the skull base. No suspicious intracranial vascular hyperdensity. Skull: Stable.  No acute osseous abnormality identified. Sinuses/Orbits: Visualized paranasal sinuses and mastoids are stable and well aerated. Other: Stable orbit and scalp soft tissues. Right forehead scalp hematoma or contusion. No underlying calvarium fracture. IMPRESSION: 1. Right forehead scalp hematoma or contusion. No underlying skull fracture. 2. No acute intracranial abnormality. Stable non contrast CT appearance of advanced chronic ischemic disease. Electronically Signed   By: Odessa Fleming M.D.   On: 03/04/2024 16:58   DG Chest Port 1 View Result Date: 03/01/2024 CLINICAL DATA:  weakness EXAM: PORTABLE CHEST 1 VIEW COMPARISON:  Chest x-ray 01/29/2023 FINDINGS: The heart and mediastinal contours are unchanged. Slightly prominent hilar vasculature. No focal consolidation. No pulmonary edema. Bilateral trace pleural effusions. No pneumothorax. No acute  osseous abnormality. IMPRESSION: 1. Pulmonary venous congestion. 2. Bilateral trace pleural effusions. Electronically Signed   By: Tish Frederickson M.D.   On: 03/01/2024 01:58   CT Head Wo Contrast Result Date: 03/01/2024 CLINICAL DATA:  Head trauma, minor (Age >= 65y) EXAM: CT HEAD WITHOUT CONTRAST TECHNIQUE: Contiguous axial images were obtained from the base of the skull through the vertex without intravenous contrast. RADIATION DOSE REDUCTION: This exam was performed according to the departmental dose-optimization program which includes automated exposure control, adjustment of the mA and/or kV according to patient size and/or use of iterative reconstruction technique. COMPARISON:  CT head 01/29/2023 FINDINGS: Brain: Patchy and confluent areas of decreased attenuation are noted throughout the deep and periventricular white matter of the cerebral hemispheres bilaterally, compatible with chronic microvascular ischemic disease. No evidence of large-territorial acute infarction.  No parenchymal hemorrhage. No mass lesion. No extra-axial collection. No mass effect or midline shift. No hydrocephalus. Basilar cisterns are patent. Vascular: No hyperdense vessel. Skull: No acute fracture or focal lesion. Sinuses/Orbits: Paranasal sinuses and mastoid air cells are clear. The orbits are unremarkable. Other: None. IMPRESSION: No acute intracranial abnormality. Electronically Signed   By: Tish Frederickson M.D.   On: 03/01/2024 01:54   CT SOFT TISSUE NECK W CONTRAST Result Date: 02/25/2024 CLINICAL DATA:  History of radiotherapy for tongue cancer. Follow-up malignant neoplasm of the tongue. EXAM: CT NECK WITH CONTRAST TECHNIQUE: Multidetector CT imaging of the neck was performed using the standard protocol following the bolus administration of intravenous contrast. RADIATION DOSE REDUCTION: This exam was performed according to the departmental dose-optimization program which includes automated exposure control, adjustment  of the mA and/or kV according to patient size and/or use of iterative reconstruction technique. CONTRAST:  75mL ISOVUE-370 IOPAMIDOL (ISOVUE-370) INJECTION 76% COMPARISON:  Head CT 04/04/2023 FINDINGS: Pharynx and larynx: Large mass centered at the left floor of mouth measuring 5.8 cm in maximum, invading the root of tongue, anterior belly of the left and likely right digastric, and mylohyoid. Broad contact with the demineralized mandible compatible with involvement. Salivary glands: The primary mass contacts the left submandibular gland which is anteriorly retracted and could be invaded. Thyroid: Unremarkable Lymph nodes: Ill-defined mass in the low left jugular chain measuring 4.2 cm craniocaudal with indistinct margination, there may be involvement of the left anterior scalene. There is a submental node in close proximity to the primary lesion which has a low-density center and measures 8 mm. Vascular: Negative. Limited intracranial: Negative Visualized orbits: Negative Mastoids and visualized paranasal sinuses: Clear Skeleton: Left mandible demineralization along the lower cortex where encompassed by mass. No distant osseous metastasis seen. Upper chest: Spiculated nodule in the left upper lobe measuring 15 mm. There is a layering right pleural effusion. These results will be called to the ordering clinician or representative by the Radiologist Assistant, and communication documented in the PACS or [employer]. IMPRESSION: History of tongue cancer with marked progression from 04/04/2023 PET CT. There is a nearly 6 cm mass involving the left floor of mouth and mandible with submental and low left jugular chain adenopathy. The left jugular chain node measures over 4 cm and shows signs of extranodal extension.New 15 mm nodule in the left upper lobe, suspect interval pulmonary metastasis. Electronically Signed   By: Tiburcio Pea M.D.   On: 02/25/2024 08:01    Microbiology: Results for orders placed or  performed during the hospital encounter of 03/01/24  Resp panel by RT-PCR (RSV, Flu A&B, Covid) Anterior Nasal Swab     Status: None   Collection Time: 03/01/24  1:25 AM   Specimen: Anterior Nasal Swab  Result Value Ref Range Status   SARS Coronavirus 2 by RT PCR NEGATIVE NEGATIVE Final    Comment: (NOTE) SARS-CoV-2 target nucleic acids are NOT DETECTED.  The SARS-CoV-2 RNA is generally detectable in upper respiratory specimens during the acute phase of infection. The lowest concentration of SARS-CoV-2 viral copies this assay can detect is 138 copies/mL. A negative result does not preclude SARS-Cov-2 infection and should not be used as the sole basis for treatment or other patient management decisions. A negative result may occur with  improper specimen collection/handling, submission of specimen other than nasopharyngeal swab, presence of viral mutation(s) within the areas targeted by this assay, and inadequate number of viral copies(<138 copies/mL). A negative result must be combined with  clinical observations, patient history, and epidemiological information. The expected result is Negative.  Fact Sheet for Patients:  BloggerCourse.com  Fact Sheet for Healthcare Providers:  SeriousBroker.it  This test is no t yet approved or cleared by the [place] FDA and  has been authorized for detection and/or diagnosis of SARS-CoV-2 by FDA under an Emergency Use Authorization (EUA). This EUA will remain  in effect (meaning this test can be used) for the duration of the COVID-19 declaration under Section 564(b)(1) of the Act, 21 U.S.C.section 360bbb-3(b)(1), unless the authorization is terminated  or revoked sooner.       Influenza A by PCR NEGATIVE NEGATIVE Final   Influenza B by PCR NEGATIVE NEGATIVE Final    Comment: (NOTE) The Xpert Xpress SARS-CoV-2/FLU/RSV plus assay is intended as an aid in the diagnosis of influenza from  Nasopharyngeal swab specimens and should not be used as a sole basis for treatment. Nasal washings and aspirates are unacceptable for Xpert Xpress SARS-CoV-2/FLU/RSV testing.  Fact Sheet for Patients: BloggerCourse.com  Fact Sheet for Healthcare Providers: SeriousBroker.it  This test is not yet approved or cleared by the [place] FDA and has been authorized for detection and/or diagnosis of SARS-CoV-2 by FDA under an Emergency Use Authorization (EUA). This EUA will remain in effect (meaning this test can be used) for the duration of the COVID-19 declaration under Section 564(b)(1) of the Act, 21 U.S.C. section 360bbb-3(b)(1), unless the authorization is terminated or revoked.     Resp Syncytial Virus by PCR NEGATIVE NEGATIVE Final    Comment: (NOTE) Fact Sheet for Patients: BloggerCourse.com  Fact Sheet for Healthcare Providers: SeriousBroker.it  This test is not yet approved or cleared by the [place] FDA and has been authorized for detection and/or diagnosis of SARS-CoV-2 by FDA under an Emergency Use Authorization (EUA). This EUA will remain in effect (meaning this test can be used) for the duration of the COVID-19 declaration under Section 564(b)(1) of the Act, 21 U.S.C. section 360bbb-3(b)(1), unless the authorization is terminated or revoked.  Performed at [facility], [address] [place], [place] [address]     Labs: CBC: Recent Labs  Lab 03/06/24 0857 03/07/24 0430  WBC 7.7 8.6  HGB 15.9 16.7  HCT 46.7 48.8  MCV 91.4 90.0  PLT 208 249   Basic Metabolic Panel: Recent Labs  Lab 03/03/24 0430 03/06/24 0857 03/07/24 0430  NA  --  138 138  K  --  4.6 4.3  CL  --  101 103  CO2  --  28 25  GLUCOSE  --  120* 142*  BUN  --  20 21  CREATININE  --  0.94 0.86  CALCIUM  --  9.5 9.5  PHOS 2.9  --   --    Liver Function Tests: Recent  Labs  Lab 03/06/24 0857  AST 25  ALT 19  ALKPHOS 94  BILITOT 1.1  PROT 6.5  ALBUMIN 3.1*   CBG: Recent Labs  Lab 03/07/24 1123 03/07/24 1721 03/07/24 2040 03/08/24 0924 03/08/24 1124  GLUCAP 142* 178* 185* 168* 128*    Discharge time spent: greater than 30 minutes.  Signed: Fran Lowes, DO [facility] 03/08/2024

## 2024-03-08 NOTE — TOC Transition Note (Signed)
 Transition of Care [facility]) - Discharge Note   Patient Details  Name: Sean Hayden MRN: [id number] Date of Birth: 1946-04-25  Transition of Care [facility]) CM/SW Contact:  Truddie Hidden, RN Phone Number: 03/08/2024, 1:00 PM   Clinical Narrative:    Per facility patient admission confirmed for today. Spoke with Darrian in admissions. Patient assigned room # 1203 @ [facility]  Reported will be called to [phone] Discharge summary sent in HUB.  Nurse, and family notified spoke with patient's wife. She was advised patient would not qualify for EMS transport due to walking 287ft. Her daughter in law will transport the patient.   TOC signing off.      Barriers to Discharge: Continued Medical Work up   Patient Goals and CMS Choice   CMS Medicare.gov Compare Post Acute Care list provided to:: Patient Choice offered to / list presented to : Patient      Discharge Placement                       Discharge Plan and Services Additional resources added to the After Visit Summary for       Post Acute Care Choice: Skilled Nursing Facility                               Social Drivers of Health (SDOH) Interventions SDOH Screenings   Food Insecurity: No Food Insecurity (03/01/2024)  Housing: Low Risk  (03/01/2024)  Transportation Needs: No Transportation Needs (03/01/2024)  Utilities: Not At Risk (03/01/2024)  Alcohol Screen: Low Risk  (01/13/2024)  Depression (PHQ2-9): Low Risk  (01/13/2024)  Financial Resource Strain: Low Risk  (01/13/2024)  Physical Activity: Inactive (01/13/2024)  Social Connections: Moderately Isolated (03/01/2024)  Stress: No Stress Concern Present (01/13/2024)  Tobacco Use: Medium Risk (03/01/2024)  Health Literacy: Adequate Health Literacy (01/13/2024)     Readmission Risk Interventions     No data to display

## 2024-03-08 NOTE — Plan of Care (Signed)

## 2024-04-01 ENCOUNTER — Ambulatory Visit: Payer: Medicare HMO | Attending: Radiation Oncology | Admitting: Radiation Oncology

## 2024-04-08 DEATH — deceased

## 2024-05-09 DEATH — deceased

## 2025-01-18 ENCOUNTER — Ambulatory Visit: Payer: Medicare HMO
